# Patient Record
Sex: Male | Born: 1960
Health system: Southern US, Community
[De-identification: ages and names within clinical notes are randomized; demographics above are authoritative.]

## PROBLEM LIST (undated history)

## (undated) VITALS — BP 114/78 | HR 70 | Temp 97.8°F | Resp 18 | Ht 72.0 in | Wt 210.0 lb

## (undated) DIAGNOSIS — F419 Anxiety disorder, unspecified: Secondary | ICD-10-CM

## (undated) DIAGNOSIS — B20 Human immunodeficiency virus [HIV] disease: Secondary | ICD-10-CM

## (undated) DIAGNOSIS — Z21 Asymptomatic human immunodeficiency virus [HIV] infection status: Secondary | ICD-10-CM

## (undated) DIAGNOSIS — F32A Depression, unspecified: Secondary | ICD-10-CM

## (undated) DIAGNOSIS — F329 Major depressive disorder, single episode, unspecified: Secondary | ICD-10-CM

## (undated) HISTORY — PX: CATARACT EXTRACTION: SUR2

---

## 2011-04-16 ENCOUNTER — Inpatient Hospital Stay (INDEPENDENT_AMBULATORY_CARE_PROVIDER_SITE_OTHER)
Admission: RE | Admit: 2011-04-16 | Discharge: 2011-04-16 | Disposition: A | Discharge status: Home / Self Care | Payer: BC Managed Care – PPO | Source: Ambulatory Visit | Attending: Family Medicine | Admitting: Family Medicine

## 2011-04-16 DIAGNOSIS — J309 Allergic rhinitis, unspecified: Secondary | ICD-10-CM

## 2013-03-26 DIAGNOSIS — F411 Generalized anxiety disorder: Secondary | ICD-10-CM | POA: Insufficient documentation

## 2013-03-26 DIAGNOSIS — F410 Panic disorder [episodic paroxysmal anxiety] without agoraphobia: Secondary | ICD-10-CM | POA: Diagnosis present

## 2013-10-03 DIAGNOSIS — F331 Major depressive disorder, recurrent, moderate: Secondary | ICD-10-CM | POA: Insufficient documentation

## 2013-10-03 DIAGNOSIS — F339 Major depressive disorder, recurrent, unspecified: Secondary | ICD-10-CM | POA: Insufficient documentation

## 2014-07-29 ENCOUNTER — Encounter (HOSPITAL_COMMUNITY): Payer: Self-pay | Admitting: Emergency Medicine

## 2014-07-29 ENCOUNTER — Emergency Department (HOSPITAL_COMMUNITY)
Admission: EM | Admit: 2014-07-29 | Discharge: 2014-07-30 | Disposition: A | Discharge status: Other Acute Inpatient Hospital | Payer: BC Managed Care – PPO | Attending: Emergency Medicine | Admitting: Emergency Medicine

## 2014-07-29 DIAGNOSIS — Z9119 Patient's noncompliance with other medical treatment and regimen: Secondary | ICD-10-CM | POA: Diagnosis not present

## 2014-07-29 DIAGNOSIS — Z91199 Patient's noncompliance with other medical treatment and regimen due to unspecified reason: Secondary | ICD-10-CM

## 2014-07-29 DIAGNOSIS — F332 Major depressive disorder, recurrent severe without psychotic features: Secondary | ICD-10-CM | POA: Diagnosis not present

## 2014-07-29 DIAGNOSIS — F22 Delusional disorders: Secondary | ICD-10-CM | POA: Insufficient documentation

## 2014-07-29 DIAGNOSIS — R45851 Suicidal ideations: Secondary | ICD-10-CM | POA: Diagnosis present

## 2014-07-29 DIAGNOSIS — R443 Hallucinations, unspecified: Secondary | ICD-10-CM

## 2014-07-29 DIAGNOSIS — F322 Major depressive disorder, single episode, severe without psychotic features: Secondary | ICD-10-CM | POA: Diagnosis present

## 2014-07-29 HISTORY — DX: Major depressive disorder, single episode, unspecified: F32.9

## 2014-07-29 HISTORY — DX: Depression, unspecified: F32.A

## 2014-07-29 LAB — COMPREHENSIVE METABOLIC PANEL
ALT: 15 U/L (ref 0–53)
AST: 15 U/L (ref 0–37)
Albumin: 4.1 g/dL (ref 3.5–5.2)
Alkaline Phosphatase: 151 U/L — ABNORMAL HIGH (ref 39–117)
Anion gap: 16 — ABNORMAL HIGH (ref 5–15)
BUN: 27 mg/dL — ABNORMAL HIGH (ref 6–23)
CO2: 24 mEq/L (ref 19–32)
Calcium: 9.6 mg/dL (ref 8.4–10.5)
Chloride: 96 mEq/L (ref 96–112)
Creatinine, Ser: 1.13 mg/dL (ref 0.50–1.35)
GFR calc Af Amer: 84 mL/min — ABNORMAL LOW (ref >90)
GFR calc non Af Amer: 73 mL/min — ABNORMAL LOW (ref >90)
Glucose, Bld: 97 mg/dL (ref 70–99)
Potassium: 2.6 mEq/L — CL (ref 3.7–5.3)
Sodium: 136 mEq/L — ABNORMAL LOW (ref 137–147)
Total Bilirubin: 0.9 mg/dL (ref 0.3–1.2)
Total Protein: 7.9 g/dL (ref 6.0–8.3)

## 2014-07-29 LAB — URINALYSIS, ROUTINE W REFLEX MICROSCOPIC
Glucose, UA: NEGATIVE mg/dL
Ketones, ur: 15 mg/dL — AB
Nitrite: POSITIVE — AB
Protein, ur: 30 mg/dL — AB
Specific Gravity, Urine: 1.036 — ABNORMAL HIGH (ref 1.005–1.030)
Urobilinogen, UA: 1 mg/dL (ref 0.0–1.0)
pH: 5.5 (ref 5.0–8.0)

## 2014-07-29 LAB — CBC
HCT: 46.1 % (ref 39.0–52.0)
Hemoglobin: 16.6 g/dL (ref 13.0–17.0)
MCH: 32 pg (ref 26.0–34.0)
MCHC: 36 g/dL (ref 30.0–36.0)
MCV: 88.8 fL (ref 78.0–100.0)
Platelets: 345 10*3/uL (ref 150–400)
RBC: 5.19 MIL/uL (ref 4.22–5.81)
RDW: 13.5 % (ref 11.5–15.5)
WBC: 8.3 10*3/uL (ref 4.0–10.5)

## 2014-07-29 LAB — URINE MICROSCOPIC-ADD ON

## 2014-07-29 LAB — RAPID URINE DRUG SCREEN, HOSP PERFORMED
Amphetamines: NOT DETECTED
Barbiturates: NOT DETECTED
Benzodiazepines: POSITIVE — AB
Cocaine: NOT DETECTED
Opiates: NOT DETECTED
Tetrahydrocannabinol: NOT DETECTED

## 2014-07-29 LAB — SALICYLATE LEVEL: Salicylate Lvl: <2 mg/dL — ABNORMAL LOW (ref 2.8–20.0)

## 2014-07-29 LAB — ACETAMINOPHEN LEVEL: Acetaminophen (Tylenol), Serum: <15 ug/mL (ref 10–30)

## 2014-07-29 LAB — ETHANOL: Alcohol, Ethyl (B): <11 mg/dL (ref 0–11)

## 2014-07-29 MED ORDER — CEPHALEXIN 500 MG PO CAPS
500.0000 mg | ORAL_CAPSULE | Freq: Four times a day (QID) | ORAL | Status: DC
  Administered 2014-07-29 – 2014-07-30 (×6): 500 mg via ORAL
  Filled 2014-07-29 (×6): qty 1

## 2014-07-29 MED ORDER — IBUPROFEN 200 MG PO TABS: 600.0000 mg | ORAL_TABLET | Freq: Three times a day (TID) | ORAL | Status: DC | PRN

## 2014-07-29 MED ORDER — NEVIRAPINE 200 MG PO TABS
200.0000 mg | ORAL_TABLET | Freq: Every day | ORAL | Status: DC
  Administered 2014-07-29 – 2014-07-30 (×2): 200 mg via ORAL
  Filled 2014-07-29 (×2): qty 1

## 2014-07-29 MED ORDER — ESCITALOPRAM OXALATE 10 MG PO TABS
20.0000 mg | ORAL_TABLET | Freq: Every day | ORAL | Status: DC
  Administered 2014-07-29 – 2014-07-30 (×2): 20 mg via ORAL
  Filled 2014-07-29 (×2): qty 2

## 2014-07-29 MED ORDER — CLONAZEPAM 0.5 MG PO TABS
1.0000 mg | ORAL_TABLET | Freq: Every day | ORAL | Status: DC
  Administered 2014-07-29: 1 mg via ORAL
  Filled 2014-07-29: qty 2

## 2014-07-29 MED ORDER — QUETIAPINE FUMARATE 100 MG PO TABS
100.0000 mg | ORAL_TABLET | Freq: Every day | ORAL | Status: DC
  Administered 2014-07-29: 100 mg via ORAL
  Filled 2014-07-29: qty 1

## 2014-07-29 MED ORDER — NICOTINE 21 MG/24HR TD PT24
21.0000 mg | MEDICATED_PATCH | Freq: Every day | TRANSDERMAL | Status: DC
  Filled 2014-07-29: qty 1

## 2014-07-29 MED ORDER — EMTRICITABINE-TENOFOVIR DF 200-300 MG PO TABS
1.0000 | ORAL_TABLET | Freq: Every day | ORAL | Status: DC
  Administered 2014-07-29 – 2014-07-30 (×2): 1 via ORAL
  Filled 2014-07-29 (×2): qty 1

## 2014-07-29 MED ORDER — ONDANSETRON HCL 4 MG PO TABS: 4.0000 mg | ORAL_TABLET | Freq: Three times a day (TID) | ORAL | Status: DC | PRN

## 2014-07-29 MED ORDER — LORAZEPAM 1 MG PO TABS: 1.0000 mg | ORAL_TABLET | Freq: Three times a day (TID) | ORAL | Status: DC | PRN

## 2014-07-29 MED ORDER — DIAZEPAM 5 MG PO TABS: 10.0000 mg | ORAL_TABLET | Freq: Every evening | ORAL | Status: DC | PRN

## 2014-07-29 MED ORDER — NEVIRAPINE ER 400 MG PO TB24: 1.0000 | ORAL_TABLET | Freq: Every day | ORAL | Status: DC

## 2014-07-29 MED ORDER — POTASSIUM CHLORIDE CRYS ER 20 MEQ PO TBCR
40.0000 meq | EXTENDED_RELEASE_TABLET | ORAL | Status: AC
  Administered 2014-07-29 (×2): 40 meq via ORAL
  Filled 2014-07-29 (×2): qty 2

## 2014-07-29 MED ORDER — POTASSIUM CHLORIDE CRYS ER 20 MEQ PO TBCR
40.0000 meq | EXTENDED_RELEASE_TABLET | Freq: Once | ORAL | Status: AC
  Administered 2014-07-29: 40 meq via ORAL
  Filled 2014-07-29: qty 2

## 2014-07-29 MED ORDER — ALUM & MAG HYDROXIDE-SIMETH 200-200-20 MG/5ML PO SUSP: 30.0000 mL | ORAL | Status: DC | PRN

## 2014-07-29 MED ORDER — ACETAMINOPHEN 325 MG PO TABS: 650.0000 mg | ORAL_TABLET | ORAL | Status: DC | PRN

## 2014-07-29 MED ORDER — ZOLPIDEM TARTRATE 5 MG PO TABS: 5.0000 mg | ORAL_TABLET | Freq: Every evening | ORAL | Status: DC | PRN

## 2014-07-29 NOTE — BH Assessment (Signed)
Assessment Note  Tyler Horn is an 53 y.o. male who [facility] for worsening depression, suicidal ideation, and delusional thinking. The assessment was conducted with the assistance of a sign language interpreter. 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  He is in a Acupuncturistdoctoral program online.  He reports that for the last several years his medications were keeping him stable until about a month ago.  Tyler Horn reports that he's been somewhat scattered in thought and has been forgetting appointments and to take his medications. He states his depression has worsened and he endorses feelings of worthlessness, anhedonia, fatigue, guilt, hopelessness, and isolating behavior. About a week before Thanksgiving, he went to [facility] evaluation of these symptoms and was discharged. He reports he ran out of his medication some time after that and was unable to get them refilled because his head was "out of sorts" and he didn't feel safe to drive.  He tried to get someone to help him pick them up, but that didn't work and he continued to decompensate.  He reports missing appointments at work and beginning to have some delusional thoughts and somatic hallucinations including someone tapping on his leg, thinking a bug was flying by , hearing some chatter in his head, and feeling a bit afraid to leave his house.  He stopped eating on Thanksgiving and his suicidal ideation increased.  He was thinking of plans to harm himself and considered starving himself so he stopped eating entirely.  He reports he spoke to his brother who encouraged him to seek help in the ED.  Tyler Horn is alert and oriented x 4, he is calm and cooperative and goal oriented.  He denies HI or SA.  He has good insight.  He reports an increase in vegetative symptoms.  He does not feel safe at home at this time and would like to get his medications stabilized.  The patient was seen with Dr Jannifer FranklinAkintayo and Nanine MeansJamison Lord  who agree to seek inpatient treatment.    Axis I: Generalized Anxiety Disorder, Major Depression, Recurrent severe and with psychotic features Axis II: Deferred Axis III:  Past Medical History  Diagnosis Date  . Depression    Axis IV: problems with access to health care services Axis V: 41-50 serious symptoms  Past Medical History:  Past Medical History  Diagnosis Date  . Depression     No past surgical history on file.  Family History: No family history on file.  Social History:  has no tobacco, alcohol, and drug history on file.  Additional Social History:     CIWA: CIWA-Ar BP: 102/62 mmHg Pulse Rate: 87 COWS:    Allergies: No Known Allergies  Home Medications:  (Not in a hospital admission)  OB/GYN Status:  No LMP for male patient.  General Assessment Data Location of Assessment: [facility] Is this a Tele or Face-to-Face Assessment?: Face-to-Face Is this an Initial Assessment or a Re-assessment for this encounter?: Initial Assessment Living Arrangements: Alone Can pt return to current living arrangement?: Yes Admission Status: Voluntary Is patient capable of signing voluntary admission?: Yes Transfer from: Home Referral Source: Self/Family/Friend     [facility] Living Arrangements: Alone Name of Psychiatrist: [place]  Education Status Is patient currently in school?: Yes Current Grade: PhD program Highest grade of school patient has completed: Master's Degree  Risk to self with the past 6 months Suicidal Ideation: Yes-Currently Present  Suicidal Intent: No Is patient at risk for suicide?: Yes Suicidal Plan?: No-Not Currently/Within Last 6 Months (starve self to death, thinking of plan) Access to Means: Yes Specify Access to Suicidal Means: not eating What has been your use of drugs/alcohol within the last 12 months?: denies Intentional Self Injurious Behavior: None Family Suicide History: No Recent stressful life event(s):   (off meds, meds not as effective) Persecutory voices/beliefs?: No Depression: Yes Depression Symptoms: Despondent, Insomnia, Tearfulness, Isolating, Fatigue, Guilt, Loss of interest in usual pleasures, Feeling worthless/self pity Substance abuse history and/or treatment for substance abuse?: No Suicide prevention information given to non-admitted patients: Not applicable  Risk to Others within the past 6 months Homicidal Ideation: No Thoughts of Harm to Others: No Current Homicidal Intent: No Current Homicidal Plan: No Access to Homicidal Means: No History of harm to others?: No Assessment of Violence: None Noted Does patient have access to weapons?: No Criminal Charges Pending?: No Does patient have a court date: No  Psychosis Hallucinations: Tactile, Visual Delusions: Somatic  Mental Status Report Appear/Hygiene: Unremarkable Eye Contact: Good Motor Activity: Freedom of movement Speech: Other (Comment) Level of Consciousness: Alert Mood: Depressed Affect: Depressed Anxiety Level: Moderate Thought Processes: Coherent, Relevant Judgement: Unimpaired Orientation: Person, Place, Time, Situation Obsessive Compulsive Thoughts/Behaviors: Moderate  Cognitive Functioning Concentration: Decreased Memory: Recent Impaired, Remote Impaired IQ: Average Insight: Fair Impulse Control: Fair Appetite: Poor Weight Loss:  (unk) Weight Gain: 0 Sleep: Decreased Total Hours of Sleep:  (difficulty sleeping) Vegetative Symptoms: Staying in bed  ADLScreening [facility] Assessment Services) Patient's cognitive ability adequate to safely complete daily activities?: Yes Patient able to express need for assistance with ADLs?: Yes Independently performs ADLs?: Yes (appropriate for developmental age)  Prior Inpatient Therapy Prior Inpatient Therapy: Yes Prior Therapy Dates: 2009 Prior [facility]ilty[facility] Reason for Treatment: Depression  Prior  Outpatient Therapy Prior Outpatient Therapy: Yes Prior Therapy Dates: Ongoing Prior Therapy Facilty/Provider(s): Dr Sandria ManlyLove Reason for Treatment: Depression/Anxiety  ADL Screening (condition at time of admission) Patient's cognitive ability adequate to safely complete daily activities?: Yes Patient able to express need for assistance with ADLs?: Yes Independently performs ADLs?: Yes (appropriate for developmental age)             Advance Directives (For Healthcare) Does patient have an advance directive?: No Would patient like information on creating an advanced directive?: No - patient declined information    Additional Information 1:1 In Past 12 Months?: No CIRT Risk: No Elopement Risk: No Does patient have medical clearance?: Yes     Disposition:  Disposition Initial Assessment Completed for this Encounter: Yes Disposition of Patient: Inpatient treatment program Type of inpatient treatment program: Adult  On Site Evaluation by:   Reviewed with Physician:    Steward RosDavee Lomax, Kimisha Eunice Marie 07/29/2014 9:57 AM

## 2014-07-29 NOTE — ED Notes (Signed)
Urinal at bedside- patient unable to void at this time

## 2014-07-29 NOTE — ED Notes (Addendum)
Per ems pt from home, family called 911 because they have not heard from him for 2 todays. Per ems pt was hallucinating, co tremors. Pt reported not eating for 5 days. Pt received Haldol 5 mg. Hx depression. Pt is deaf. Per ems CBG 101

## 2014-07-29 NOTE — ED Notes (Signed)
Pt reports through interpretor that he sees a masked man like scream in the movie, feels bugs crawling on him and hears mumbling voices. Pt says that he had suicidal ideations with no specific plan before coming to the hospital and getting medication. Pt denies hi. Pt says that hallucinations are low now compared to before he came to the hospital. Safety maintained in the TCU.

## 2014-07-29 NOTE — ED Notes (Signed)
[facility] for [facility] [phone] option 2

## 2014-07-29 NOTE — ED Notes (Signed)
TTS at bedside. 

## 2014-07-29 NOTE — ED Notes (Signed)
Sign language interpreter at bedside. Dr. Gwendolyn GrantWalden made aware.

## 2014-07-29 NOTE — Progress Notes (Signed)
CSW met with pt and pt mother at bedside with interpreter. Pt signed consent to release information to pt mother. CSW, pt, TTS, and mother discussed pt dispositoin of pursuing inpatient treatment while also trying to stabilize patient while waiting in the ED. Patient is aware that we will look at local hosptials and [facility] has a hearing impaired unit. Patient agreeable with plan. CSW assisted with getting fmla papers from his work and provdided those papers to patient as he requested. Patient and CSW also discuss HCPOA and CSW to assist with completion tomorrow. CSW and TTS provided supportive counseling. 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   Noreene Larsson [phone]  ED CSW 07/29/2014 1547pm

## 2014-07-29 NOTE — ED Provider Notes (Signed)
CSN: [id number]     Arrival date & time 07/29/14  [id number] History   First MD Initiated Contact with Patient 07/29/14 0754     Chief Complaint  Patient presents with  . Hallucinations  . Suicidal     (Consider location/radiation/quality/duration/timing/severity/associated sxs/prior Treatment) Patient is a 53 y.o. male presenting with mental health disorder. The history is provided by the patient.  Mental Health Problem Presenting symptoms: suicidal thoughts and suicide attempt (tried to starve himself to death)   Degree of incapacity (severity):  Moderate Onset quality:  Gradual Duration:  1 week Timing:  Constant Progression:  Worsening Chronicity:  New Context: noncompliance (couldn't get his meds refilled - was too "out of it" to drive)   Treatment compliance:  Most of the time Time since last psychoactive medication taken:  1 week Relieved by:  Nothing Worsened by:  Nothing tried Associated symptoms: no abdominal pain     Past Medical History  Diagnosis Date  . Depression    No past surgical history on file. No family history on file. History  Substance Use Topics  . Smoking status: Not on file  . Smokeless tobacco: Not on file  . Alcohol Use: Not on file    Review of Systems  Constitutional: Negative for fever.  Respiratory: Negative for shortness of breath.   Gastrointestinal: Negative for vomiting and abdominal pain.  Psychiatric/Behavioral: Positive for suicidal ideas.  All other systems reviewed and are negative.     Allergies  Review of patient's allergies indicates no known allergies.  Home Medications   Prior to Admission medications   Not on File   BP 102/62 mmHg  Pulse 87  Temp(Src) 97.8 F (36.6 C) (Oral)  Resp 20  SpO2 97% Physical Exam  Constitutional: He is oriented to person, place, and time. He appears well-developed and well-nourished. No distress.  HENT:  Head: Normocephalic and atraumatic.  Mouth/Throat: Oropharynx is clear and  moist. No oropharyngeal exudate.  Eyes: EOM are normal. Pupils are equal, round, and reactive to light.  Neck: Normal range of motion. Neck supple.  Cardiovascular: Normal rate and regular rhythm.  Exam reveals no friction rub.   No murmur heard. Pulmonary/Chest: Effort normal and breath sounds normal. No respiratory distress. He has no wheezes. He has no rales.  Abdominal: Soft. He exhibits no distension. There is no tenderness. There is no rebound.  Musculoskeletal: Normal range of motion. He exhibits no edema.  Neurological: He is alert and oriented to person, place, and time.  Skin: No rash noted. He is not diaphoretic.  Psychiatric: He expresses suicidal ideation. He expresses no homicidal ideation. He expresses suicidal plans (starve himself to death). He expresses no homicidal plans.  Nursing note and vitals reviewed.   ED Course  Procedures (including critical care time) Labs Review Labs Reviewed  CBC  COMPREHENSIVE METABOLIC PANEL  SALICYLATE LEVEL  ACETAMINOPHEN LEVEL  ETHANOL  URINALYSIS, ROUTINE W REFLEX MICROSCOPIC  URINE RAPID DRUG SCREEN (HOSP PERFORMED)    Imaging Review No results found.   EKG Interpretation None      MDM   Final diagnoses:  Hallucinations  Noncompliance  Suicidal ideation    53 year old male with history of depression, prior psychiatric hospitalization 7 or 8 years ago, deafness presents with hallucinations and suicidal ideation. Patient has not had his meds in over a week, he tried to drive to get them refilled but was feeling out of it. He has not eaten in the past week and has felt suicidal wanting  to start himself to death. He's had other hallucinations of people tapping him on the shoulder, hearing indistinct voices, bugs crawling on him. These are similar to prior hallucinations. Will check labs, consult TTS. Labs show hypokalemia. Plan for scheduled potassium. [facility] [place]  because they have a deaf unit. Labs show UTI, culture sent, will start on Keflex.  Elwin MochaBlair Tammela Bales, MD 07/29/14 [phone]

## 2014-07-30 ENCOUNTER — Inpatient Hospital Stay (HOSPITAL_COMMUNITY)
Admission: AD | Admit: 2014-07-30 | Discharge: 2014-08-06 | DRG: 885 | Disposition: A | Discharge status: Home / Self Care | Payer: BC Managed Care – PPO | Source: Intra-hospital | Attending: Psychiatry | Admitting: Psychiatry

## 2014-07-30 ENCOUNTER — Encounter (HOSPITAL_COMMUNITY): Payer: Self-pay | Admitting: Registered Nurse

## 2014-07-30 ENCOUNTER — Encounter (HOSPITAL_COMMUNITY): Payer: Self-pay | Admitting: *Deleted

## 2014-07-30 DIAGNOSIS — R441 Visual hallucinations: Secondary | ICD-10-CM | POA: Insufficient documentation

## 2014-07-30 DIAGNOSIS — R45851 Suicidal ideations: Secondary | ICD-10-CM

## 2014-07-30 DIAGNOSIS — F332 Major depressive disorder, recurrent severe without psychotic features: Secondary | ICD-10-CM | POA: Diagnosis present

## 2014-07-30 DIAGNOSIS — H9193 Unspecified hearing loss, bilateral: Secondary | ICD-10-CM | POA: Diagnosis present

## 2014-07-30 DIAGNOSIS — F410 Panic disorder [episodic paroxysmal anxiety] without agoraphobia: Secondary | ICD-10-CM | POA: Diagnosis present

## 2014-07-30 DIAGNOSIS — N390 Urinary tract infection, site not specified: Secondary | ICD-10-CM | POA: Diagnosis present

## 2014-07-30 DIAGNOSIS — F322 Major depressive disorder, single episode, severe without psychotic features: Secondary | ICD-10-CM | POA: Diagnosis present

## 2014-07-30 DIAGNOSIS — B20 Human immunodeficiency virus [HIV] disease: Secondary | ICD-10-CM | POA: Diagnosis present

## 2014-07-30 DIAGNOSIS — G4700 Insomnia, unspecified: Secondary | ICD-10-CM | POA: Diagnosis present

## 2014-07-30 DIAGNOSIS — F29 Unspecified psychosis not due to a substance or known physiological condition: Secondary | ICD-10-CM

## 2014-07-30 DIAGNOSIS — Z21 Asymptomatic human immunodeficiency virus [HIV] infection status: Secondary | ICD-10-CM | POA: Diagnosis present

## 2014-07-30 DIAGNOSIS — F329 Major depressive disorder, single episode, unspecified: Secondary | ICD-10-CM | POA: Diagnosis present

## 2014-07-30 DIAGNOSIS — F1390 Sedative, hypnotic, or anxiolytic use, unspecified, uncomplicated: Secondary | ICD-10-CM

## 2014-07-30 DIAGNOSIS — F13232 Sedative, hypnotic or anxiolytic dependence with withdrawal with perceptual disturbance: Secondary | ICD-10-CM | POA: Diagnosis present

## 2014-07-30 DIAGNOSIS — F411 Generalized anxiety disorder: Secondary | ICD-10-CM | POA: Diagnosis present

## 2014-07-30 DIAGNOSIS — F1320 Sedative, hypnotic or anxiolytic dependence, uncomplicated: Secondary | ICD-10-CM

## 2014-07-30 DIAGNOSIS — F22 Delusional disorders: Secondary | ICD-10-CM | POA: Diagnosis present

## 2014-07-30 DIAGNOSIS — R443 Hallucinations, unspecified: Secondary | ICD-10-CM | POA: Insufficient documentation

## 2014-07-30 DIAGNOSIS — F333 Major depressive disorder, recurrent, severe with psychotic symptoms: Secondary | ICD-10-CM | POA: Diagnosis present

## 2014-07-30 HISTORY — DX: Asymptomatic human immunodeficiency virus (hiv) infection status: Z21

## 2014-07-30 HISTORY — DX: Anxiety disorder, unspecified: F41.9

## 2014-07-30 HISTORY — DX: Human immunodeficiency virus (HIV) disease: B20

## 2014-07-30 LAB — URINE CULTURE
Colony Count: NO GROWTH
Culture: NO GROWTH
Special Requests: NORMAL

## 2014-07-30 LAB — BASIC METABOLIC PANEL
Anion gap: 12 (ref 5–15)
BUN: 26 mg/dL — ABNORMAL HIGH (ref 6–23)
CO2: 29 mEq/L (ref 19–32)
Calcium: 9.6 mg/dL (ref 8.4–10.5)
Chloride: 99 mEq/L (ref 96–112)
Creatinine, Ser: 1.11 mg/dL (ref 0.50–1.35)
GFR calc Af Amer: 86 mL/min — ABNORMAL LOW (ref >90)
GFR calc non Af Amer: 74 mL/min — ABNORMAL LOW (ref >90)
Glucose, Bld: 103 mg/dL — ABNORMAL HIGH (ref 70–99)
Potassium: 3.9 mEq/L (ref 3.7–5.3)
Sodium: 140 mEq/L (ref 137–147)

## 2014-07-30 MED ORDER — CEPHALEXIN 500 MG PO CAPS
500.0000 mg | ORAL_CAPSULE | Freq: Four times a day (QID) | ORAL | Status: AC
  Administered 2014-07-30 – 2014-08-05 (×23): 500 mg via ORAL
  Filled 2014-07-30 (×8): qty 1
  Filled 2014-07-30: qty 2
  Filled 2014-07-30 (×10): qty 1
  Filled 2014-07-30: qty 2
  Filled 2014-07-30 (×5): qty 1

## 2014-07-30 MED ORDER — EMTRICITABINE-TENOFOVIR DF 200-300 MG PO TABS
1.0000 | ORAL_TABLET | Freq: Every day | ORAL | Status: DC
  Administered 2014-07-31 – 2014-08-06 (×7): 1 via ORAL
  Filled 2014-07-30 (×8): qty 1
  Filled 2014-07-30: qty 4

## 2014-07-30 MED ORDER — NEVIRAPINE ER 400 MG PO TB24: 1.0000 | ORAL_TABLET | Freq: Every day | ORAL | Status: DC

## 2014-07-30 MED ORDER — ZOLPIDEM TARTRATE 5 MG PO TABS
5.0000 mg | ORAL_TABLET | Freq: Every evening | ORAL | Status: DC | PRN
  Administered 2014-07-30: 5 mg via ORAL
  Filled 2014-07-30: qty 1

## 2014-07-30 MED ORDER — ESCITALOPRAM OXALATE 20 MG PO TABS
20.0000 mg | ORAL_TABLET | Freq: Every day | ORAL | Status: DC
  Administered 2014-07-31: 20 mg via ORAL
  Filled 2014-07-30: qty 2
  Filled 2014-07-30 (×2): qty 1

## 2014-07-30 MED ORDER — MAGNESIUM HYDROXIDE 400 MG/5ML PO SUSP: 30.0000 mL | Freq: Every day | ORAL | Status: DC | PRN

## 2014-07-30 MED ORDER — CLONAZEPAM 1 MG PO TABS
1.0000 mg | ORAL_TABLET | Freq: Every day | ORAL | Status: DC
  Administered 2014-07-30: 1 mg via ORAL
  Filled 2014-07-30: qty 1

## 2014-07-30 MED ORDER — QUETIAPINE FUMARATE 100 MG PO TABS
100.0000 mg | ORAL_TABLET | Freq: Every day | ORAL | Status: DC
  Administered 2014-07-30: 100 mg via ORAL
  Filled 2014-07-30 (×3): qty 1

## 2014-07-30 MED ORDER — NEVIRAPINE 200 MG PO TABS
200.0000 mg | ORAL_TABLET | Freq: Every day | ORAL | Status: DC
  Administered 2014-07-31 – 2014-08-06 (×7): 200 mg via ORAL
  Filled 2014-07-30 (×3): qty 1
  Filled 2014-07-30: qty 2
  Filled 2014-07-30 (×6): qty 1
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
## 2014-07-30 NOTE — Progress Notes (Signed)
  CARE MANAGEMENT ED NOTE 07/30/2014  Patient:  Annice PihHUNTER,Marlene   Account Number:  [id number]  Date Initiated:  07/30/2014  Documentation initiated by:  Edd ArbourGIBBS,Andrews Tener  Subjective/Objective Assessment:   53 yr [place] bcbs [place]     Subjective/Objective Assessment Detail:   Pcp confirms (with Interpreter assistance) that he has a psychiatrist not a pcp  Pt agreed to a list of in network pcps via [employer] web site     Action/Plan:   Pt given a list of in network pcps & psychiatrists for [facility]   Action/Plan Detail:   Anticipated DC Date:       Status Recommendation to Physician:   Result of Recommendation:    Other ED Services  Consult Working Psychologist, educationallan    DC Planning Services  Other  Outpatient Services - Pt will follow up  PCP issues    Choice offered to / List presented to:            Status of service:  Completed, signed off  ED Comments:   ED Comments Detail:

## 2014-07-30 NOTE — BHH Group Notes (Signed)
Adult Psychoeducational Group Note  Date:  07/30/2014 Time:  9:37 PM  Group Topic/Focus:  Wrap-Up Group:   The focus of this group is to help patients review their daily goal of treatment and discuss progress on daily workbooks.  Participation Level:  Active  Participation Quality:  Appropriate  Affect:  Appropriate  Cognitive:  Appropriate  Insight: Appropriate  Engagement in Group:  Engaged  Modes of Intervention:  Discussion  Additional Comments:  Tyler Horn is Horn new admit.  He stated his goal is to participate in groups, become healthy again, be compliant with schedule, meals, etc.  His motivation is staying positive and learning different things from the groups because "it's Horn journey to reach recovery and treatment".  Tyler Horn, Tyler Horn 07/30/2014, 9:37 PM

## 2014-07-30 NOTE — BH Assessment (Signed)
BHH Assessment Progress Note  Per Berneice Heinrichina Tate, RN, [facility] [place] [phone] is available for pt, who was previously accepted by Thedore MinsMojeed Akintayo, MD.  Pt signed Voluntary Admission and Consent for Treatment, as well as Consent to Release Information, both of which were faxed to [facility].  Pt's nurse, Joslyn Devonaroline Beaudry, has been informed; she agrees to send original paperwork with the pt and to call report to [phone].  Doylene Canninghomas Aysia Lowder, MA Triage Specialist 07/30/2014 @ 13:05

## 2014-07-30 NOTE — ED Notes (Signed)
Pt  Talking (signing)  in while asleep

## 2014-07-30 NOTE — Consult Note (Addendum)
[facility] Face-to-Face Psychiatry Consult   Reason for Consult:  Suicidal ideation Referring Physician:  [name]  Tyler Horn is an 53 y.o. male. Total Time spent with patient: 45 minutes  Assessment: AXIS I:  Major Depression, Recurrent severe and Psychotic Disorder NOS AXIS II:  Deferred AXIS III:   Past Medical History  Diagnosis Date  . Depression    AXIS IV:  other psychosocial or environmental problems AXIS V:  11-20 some danger of hurting self or others possible OR occasionally fails to maintain minimal personal hygiene OR gross impairment in communication  Plan:  Recommend psychiatric Inpatient admission when medically cleared.  Subjective:   Tyler Horn is a 53 y.o. male patient.  HPI:  Patient presents with complaints of suicidal ideation, PTSD, and worsening depression.  Patient states that he is feeling a little better today since he has gotten some rest.  Patient continue to endorse suicidal ideation but states that his mind is "little clearer; not as foggy and yesterday."  HPI Elements:   Location:  Worsening depression. Quality:  suicidal ideation. Severity:  suicidal ideation. Timing:  1 week.  Past Psychiatric History: Past Medical History  Diagnosis Date  . Depression     has no tobacco, alcohol, and drug history on file. History reviewed. No pertinent family history. Family History Substance Abuse: No Family Supports: Yes, List: (mother in [place], Brother in [place]) Living Arrangements: Alone Can pt return to current living arrangement?: Yes   Allergies:  No Known Allergies  ACT Assessment Complete:  Yes:    Educational Status    Risk to Self: Risk to self with the past 6 months Suicidal Ideation: Yes-Currently Present Suicidal Intent: No Is patient at risk for suicide?: Yes Suicidal Plan?: No-Not Currently/Within Last 6 Months (starve self to death, thinking of plan) Access to Means: Yes Specify Access to Suicidal Means: not eating What has been  your use of drugs/alcohol within the last 12 months?: denies Intentional Self Injurious Behavior: None Family Suicide History: No Recent stressful life event(s):  (off meds, meds not as effective) Persecutory voices/beliefs?: No Depression: Yes Depression Symptoms: Despondent, Insomnia, Tearfulness, Isolating, Fatigue, Guilt, Loss of interest in usual pleasures, Feeling worthless/self pity Substance abuse history and/or treatment for substance abuse?: No Suicide prevention information given to non-admitted patients: Not applicable  Risk to Others: Risk to Others within the past 6 months Homicidal Ideation: No Thoughts of Harm to Others: No Current Homicidal Intent: No Current Homicidal Plan: No Access to Homicidal Means: No History of harm to others?: No Assessment of Violence: None Noted Does patient have access to weapons?: No Criminal Charges Pending?: No Does patient have a court date: No  Abuse:    Prior Inpatient Therapy: Prior Inpatient Therapy Prior Inpatient Therapy: Yes Prior Therapy Dates: 2009 Prior Therapy Facilty/Provider(s): [facility] Reason for Treatment: Depression  Prior Outpatient Therapy: Prior Outpatient Therapy Prior Outpatient Therapy: Yes Prior Therapy Dates: Ongoing Prior Therapy Facilty/Provider(s): Dr Erling Cruz Reason for Treatment: Depression/Anxiety  Additional Information: Additional Information 1:1 In Past 12 Months?: No CIRT Risk: No Elopement Risk: No Does patient have medical clearance?: Yes                  Objective: Blood pressure 108/70, pulse 89, temperature 97.9 F (36.6 C), temperature source Oral, resp. rate 20, SpO2 94 %.There is no height or weight on file to calculate BMI. Results for orders placed or performed during the hospital encounter of 07/29/14 (from the past 72 hour(s))  CBC  Status: None   Collection Time: 07/29/14  8:52 AM  Result Value Ref Range   WBC 8.3 4.0 - 10.5 K/uL   RBC 5.19  4.22 - 5.81 MIL/uL   Hemoglobin 16.6 13.0 - 17.0 g/dL   HCT 46.1 39.0 - 52.0 %   MCV 88.8 78.0 - 100.0 fL   MCH 32.0 26.0 - 34.0 pg   MCHC 36.0 30.0 - 36.0 g/dL   RDW 13.5 11.5 - 15.5 %   Platelets 345 150 - 400 K/uL  Comprehensive metabolic panel     Status: Abnormal   Collection Time: 07/29/14  8:52 AM  Result Value Ref Range   Sodium 136 (L) 137 - 147 mEq/L   Potassium 2.6 (LL) 3.7 - 5.3 mEq/L    Comment: CRITICAL RESULT CALLED TO, READ BACK BY AND VERIFIED WITH: HAMBY,M @ 1012 ON [id number] BY POTEAT,S    Chloride 96 96 - 112 mEq/L   CO2 24 19 - 32 mEq/L   Glucose, Bld 97 70 - 99 mg/dL   BUN 27 (H) 6 - 23 mg/dL   Creatinine, Ser 1.13 0.50 - 1.35 mg/dL   Calcium 9.6 8.4 - 10.5 mg/dL   Total Protein 7.9 6.0 - 8.3 g/dL   Albumin 4.1 3.5 - 5.2 g/dL   AST 15 0 - 37 U/L   ALT 15 0 - 53 U/L   Alkaline Phosphatase 151 (H) 39 - 117 U/L   Total Bilirubin 0.9 0.3 - 1.2 mg/dL   GFR calc non Af Amer 73 (L) >90 mL/min   GFR calc Af Amer 84 (L) >90 mL/min    Comment: (NOTE) The eGFR has been calculated using the CKD EPI equation. This calculation has not been validated in all clinical situations. eGFR's persistently <90 mL/min signify possible Chronic Kidney Disease.    Anion gap 16 (H) 5 - 15  Salicylate level     Status: Abnormal   Collection Time: 07/29/14  8:52 AM  Result Value Ref Range   Salicylate Lvl <4.1 (L) 2.8 - 20.0 mg/dL  Acetaminophen level     Status: None   Collection Time: 07/29/14  8:52 AM  Result Value Ref Range   Acetaminophen (Tylenol), Serum <15.0 10 - 30 ug/mL    Comment:        THERAPEUTIC CONCENTRATIONS VARY SIGNIFICANTLY. A RANGE OF 10-30 ug/mL MAY BE AN EFFECTIVE CONCENTRATION FOR MANY PATIENTS. HOWEVER, SOME ARE BEST TREATED AT CONCENTRATIONS OUTSIDE THIS RANGE. ACETAMINOPHEN CONCENTRATIONS >150 ug/mL AT 4 HOURS AFTER INGESTION AND >50 ug/mL AT 12 HOURS AFTER INGESTION ARE OFTEN ASSOCIATED WITH TOXIC REACTIONS.   Ethanol     Status: None    Collection Time: 07/29/14  8:52 AM  Result Value Ref Range   Alcohol, Ethyl (B) <11 0 - 11 mg/dL    Comment:        LOWEST DETECTABLE LIMIT FOR SERUM ALCOHOL IS 11 mg/dL FOR MEDICAL PURPOSES ONLY   Urinalysis, Routine w reflex microscopic     Status: Abnormal   Collection Time: 07/29/14 12:30 PM  Result Value Ref Range   Color, Urine ORANGE (A) YELLOW    Comment: BIOCHEMICALS MAY BE AFFECTED BY COLOR   APPearance CLEAR CLEAR   Specific Gravity, Urine 1.036 (H) 1.005 - 1.030   pH 5.5 5.0 - 8.0   Glucose, UA NEGATIVE NEGATIVE mg/dL   Hgb urine dipstick TRACE (A) NEGATIVE   Bilirubin Urine LARGE (A) NEGATIVE   Ketones, ur 15 (A) NEGATIVE mg/dL   Protein, ur 30 (  A) NEGATIVE mg/dL   Urobilinogen, UA 1.0 0.0 - 1.0 mg/dL   Nitrite POSITIVE (A) NEGATIVE   Leukocytes, UA TRACE (A) NEGATIVE  Urine rapid drug screen (hosp performed)     Status: Abnormal   Collection Time: 07/29/14 12:30 PM  Result Value Ref Range   Opiates NONE DETECTED NONE DETECTED   Cocaine NONE DETECTED NONE DETECTED   Benzodiazepines POSITIVE (A) NONE DETECTED   Amphetamines NONE DETECTED NONE DETECTED   Tetrahydrocannabinol NONE DETECTED NONE DETECTED   Barbiturates NONE DETECTED NONE DETECTED    Comment:        DRUG SCREEN FOR MEDICAL PURPOSES ONLY.  IF CONFIRMATION IS NEEDED FOR ANY PURPOSE, NOTIFY LAB WITHIN 5 DAYS.        LOWEST DETECTABLE LIMITS FOR URINE DRUG SCREEN Drug Class       Cutoff (ng/mL) Amphetamine      1000 Barbiturate      200 Benzodiazepine   419 Tricyclics       379 Opiates          300 Cocaine          300 THC              50   Urine microscopic-add on     Status: Abnormal   Collection Time: 07/29/14 12:30 PM  Result Value Ref Range   WBC, UA 3-6 <3 WBC/hpf   RBC / HPF 0-2 <3 RBC/hpf   Bacteria, UA FEW (A) RARE   Urine-Other MUCOUS PRESENT   Basic metabolic panel     Status: Abnormal   Collection Time: 07/29/14 10:32 PM  Result Value Ref Range   Sodium 140 137 - 147 mEq/L    Potassium 3.9 3.7 - 5.3 mEq/L    Comment: DELTA CHECK NOTED REPEATED TO VERIFY NO VISIBLE HEMOLYSIS    Chloride 99 96 - 112 mEq/L   CO2 29 19 - 32 mEq/L   Glucose, Bld 103 (H) 70 - 99 mg/dL   BUN 26 (H) 6 - 23 mg/dL   Creatinine, Ser 1.11 0.50 - 1.35 mg/dL   Calcium 9.6 8.4 - 10.5 mg/dL   GFR calc non Af Amer 74 (L) >90 mL/min   GFR calc Af Amer 86 (L) >90 mL/min    Comment: (NOTE) The eGFR has been calculated using the CKD EPI equation. This calculation has not been validated in all clinical situations. eGFR's persistently <90 mL/min signify possible Chronic Kidney Disease.    Anion gap 12 5 - 15   Labs are reviewed see values above.  Medications reviewed and no changes made  Current Facility-Administered Medications  Medication Dose Route Frequency Provider Last Rate Last Dose  . acetaminophen (TYLENOL) tablet 650 mg  650 mg Oral Q4H PRN Evelina Bucy, MD      . alum & mag hydroxide-simeth (MAALOX/MYLANTA) 200-200-20 MG/5ML suspension 30 mL  30 mL Oral PRN Evelina Bucy, MD      . cephALEXin [facility]) capsule 500 mg  500 mg Oral 4 times per day Evelina Bucy, MD   500 mg at 07/30/14 1345  . clonazePAM (KLONOPIN) tablet 1 mg  1 mg Oral QHS Evelina Bucy, MD   1 mg at 07/29/14 2120  . diazepam (VALIUM) tablet 10 mg  10 mg Oral QHS PRN Evelina Bucy, MD      . emtricitabine-tenofovir (TRUVADA) 200-300 MG per tablet 1 tablet  1 tablet Oral Daily Evelina Bucy, MD   1 tablet at 07/30/14 1023  . escitalopram (LEXAPRO) tablet 20  mg  20 mg Oral Daily Evelina Bucy, MD   20 mg at 07/30/14 1022  . ibuprofen (ADVIL,MOTRIN) tablet 600 mg  600 mg Oral Q8H PRN Evelina Bucy, MD      . LORazepam (ATIVAN) tablet 1 mg  1 mg Oral Q8H PRN Evelina Bucy, MD      . nevirapine [facility]) tablet 200 mg  200 mg Oral Daily Evelina Bucy, MD   200 mg at 07/30/14 1023  . [START ON 08/12/2014] Nevirapine TB24 1 tablet  1 tablet Oral Daily Evelina Bucy, MD      . ondansetron [facility]) tablet 4 mg  4 mg Oral Q8H PRN  Evelina Bucy, MD      . QUEtiapine (SEROQUEL) tablet 100 mg  100 mg Oral QHS Evelina Bucy, MD   100 mg at 07/29/14 2120  . zolpidem (AMBIEN) tablet 5 mg  5 mg Oral QHS PRN Evelina Bucy, MD       Current Outpatient Prescriptions  Medication Sig Dispense Refill  . clonazePAM (KLONOPIN) 1 MG tablet Take 1 tablet by mouth at bedtime.    . diazepam (VALIUM) 10 MG tablet Take 10 mg by mouth at bedtime as needed for anxiety.    Marland Kitchen emtricitabine-tenofovir (TRUVADA) 200-300 MG per tablet Take 1 tablet by mouth daily.    Marland Kitchen escitalopram (LEXAPRO) 20 MG tablet Take 1 tablet by mouth daily.    . Nevirapine (VIRAMUNE XR) 400 MG TB24 Take 1 tablet by mouth daily.    . QUEtiapine (SEROQUEL) 100 MG tablet Take 1 tablet by mouth at bedtime.      Psychiatric Specialty Exam:     Blood pressure 108/70, pulse 89, temperature 97.9 F (36.6 C), temperature source Oral, resp. rate 20, SpO2 94 %.There is no height or weight on file to calculate BMI.  General Appearance: Casual  Eye Contact::  Good  Speech:  Sign language   Volume:  Sign language   Mood:  Depressed  Affect:  Congruent  Thought Process:  Circumstantial, Coherent and Goal Directed  Orientation:  Full (Time, Place, and Person)  Thought Content:  Delusions  Suicidal Thoughts:  Yes.  with intent/plan  Homicidal Thoughts:  No  Memory:  Immediate;   Good Recent;   Good Remote;   Good  Judgement:  Fair  Insight:  Fair  Psychomotor Activity:  Normal  Concentration:  Fair  Recall:  Good  Fund of Knowledge:Good  Language: Good  Akathisia:  No  Handed:  Right  AIMS (if indicated):     Assets:  Communication Skills Desire for Improvement Social Support  Sleep:      Musculoskeletal: Strength & Muscle Tone: within normal limits Gait & Station: Did not see patient ambulate Patient leans: N/A  Treatment Plan Summary: Daily contact with patient to assess and evaluate symptoms and progress in treatment Medication management Inpatient  treatment for stabilization recommended         Patient accepted to [facility] 508/02  Earleen Newport, FNP-BC 07/30/2014 4:50 PM  Patient seen, evaluated and I agree with notes by Nurse Practitioner. Corena Pilgrim, MD

## 2014-07-30 NOTE — ED Notes (Signed)
Patient has been quietly resting this morning.  Interpreter was here when MD was making rounds.  [facility]  He will be transferred for further inpatient treatment once interpreter returns.  Patient denies any SI; states he is "feeling better this morning."  He is pleasant and cooperative.

## 2014-07-30 NOTE — ED Notes (Signed)
Interpreter paged.  Tresa EndoKelly returned call and interpreter will arrive at 0930 before MD makes rounds.

## 2014-07-30 NOTE — Tx Team (Signed)
Initial Interdisciplinary Treatment Plan   PATIENT STRESSORS: Health problems Occupational concerns   PATIENT STRENGTHS: Ability for insight Average or above average intelligence Supportive family/friends   PROBLEM LIST: Problem List/Patient Goals Date to be addressed Date deferred Reason deferred Estimated date of resolution  Suicidal ideation 07/30/14   DC  depression                                                 DISCHARGE CRITERIA:  Improved stabilization in mood, thinking, and/or behavior Reduction of life-threatening or endangering symptoms to within safe limits Verbal commitment to aftercare and medication compliance  PRELIMINARY DISCHARGE PLAN: Outpatient therapy Return to previous living arrangement Return to previous work or school arrangements  PATIENT/FAMIILY INVOLVEMENT: This treatment plan has been presented to and reviewed with the patient, Tyler Horn, and/or family member, pt.  The patient and family have been given the opportunity to ask questions and make suggestions.  [name], [name] 07/30/2014, 7:47 PM

## 2014-07-31 ENCOUNTER — Encounter (HOSPITAL_COMMUNITY): Payer: Self-pay | Admitting: Psychiatry

## 2014-07-31 DIAGNOSIS — F333 Major depressive disorder, recurrent, severe with psychotic symptoms: Secondary | ICD-10-CM | POA: Diagnosis present

## 2014-07-31 DIAGNOSIS — F411 Generalized anxiety disorder: Secondary | ICD-10-CM

## 2014-07-31 DIAGNOSIS — B20 Human immunodeficiency virus [HIV] disease: Secondary | ICD-10-CM | POA: Diagnosis present

## 2014-07-31 DIAGNOSIS — F1320 Sedative, hypnotic or anxiolytic dependence, uncomplicated: Secondary | ICD-10-CM

## 2014-07-31 DIAGNOSIS — F13932 Sedative, hypnotic or anxiolytic use, unspecified with withdrawal with perceptual disturbances: Secondary | ICD-10-CM

## 2014-07-31 DIAGNOSIS — F322 Major depressive disorder, single episode, severe without psychotic features: Secondary | ICD-10-CM

## 2014-07-31 DIAGNOSIS — F1390 Sedative, hypnotic, or anxiolytic use, unspecified, uncomplicated: Secondary | ICD-10-CM

## 2014-07-31 MED ORDER — THIAMINE HCL 100 MG/ML IJ SOLN: 100.0000 mg | Freq: Once | INTRAMUSCULAR | Status: DC

## 2014-07-31 MED ORDER — LORAZEPAM 1 MG PO TABS: 1.0000 mg | ORAL_TABLET | Freq: Four times a day (QID) | ORAL | Status: DC

## 2014-07-31 MED ORDER — CHLORDIAZEPOXIDE HCL 25 MG PO CAPS
25.0000 mg | ORAL_CAPSULE | Freq: Four times a day (QID) | ORAL | Status: AC | PRN
  Administered 2014-08-02 – 2014-08-03 (×3): 25 mg via ORAL
  Filled 2014-07-31 (×2): qty 1

## 2014-07-31 MED ORDER — LORAZEPAM 1 MG PO TABS: 1.0000 mg | ORAL_TABLET | Freq: Three times a day (TID) | ORAL | Status: DC

## 2014-07-31 MED ORDER — HYDROXYZINE HCL 25 MG PO TABS
25.0000 mg | ORAL_TABLET | Freq: Four times a day (QID) | ORAL | Status: DC | PRN
Start: 2014-07-31 — End: 2014-07-31

## 2014-07-31 MED ORDER — HYDROXYZINE HCL 25 MG PO TABS
25.0000 mg | ORAL_TABLET | Freq: Four times a day (QID) | ORAL | Status: AC | PRN
  Administered 2014-08-02: 25 mg via ORAL
  Filled 2014-07-31: qty 1

## 2014-07-31 MED ORDER — LOPERAMIDE HCL 2 MG PO CAPS: 2.0000 mg | ORAL_CAPSULE | ORAL | Status: DC | PRN

## 2014-07-31 MED ORDER — CLONAZEPAM 0.5 MG PO TABS: 0.2500 mg | ORAL_TABLET | Freq: Three times a day (TID) | ORAL | Status: DC

## 2014-07-31 MED ORDER — FLUVOXAMINE MALEATE 50 MG PO TABS: 25.0000 mg | ORAL_TABLET | Freq: Two times a day (BID) | ORAL | Status: DC

## 2014-07-31 MED ORDER — FLUVOXAMINE MALEATE 50 MG PO TABS
25.0000 mg | ORAL_TABLET | Freq: Every day | ORAL | Status: DC
  Administered 2014-07-31 – 2014-08-02 (×3): 25 mg via ORAL
  Filled 2014-07-31 (×5): qty 1

## 2014-07-31 MED ORDER — CHLORDIAZEPOXIDE HCL 25 MG PO CAPS
25.0000 mg | ORAL_CAPSULE | Freq: Four times a day (QID) | ORAL | Status: AC
  Administered 2014-07-31 (×3): 25 mg via ORAL
  Filled 2014-07-31 (×3): qty 1

## 2014-07-31 MED ORDER — GABAPENTIN 100 MG PO CAPS
100.0000 mg | ORAL_CAPSULE | Freq: Three times a day (TID) | ORAL | Status: DC
  Administered 2014-07-31 – 2014-08-02 (×7): 100 mg via ORAL
  Filled 2014-07-31 (×15): qty 1

## 2014-07-31 MED ORDER — ESCITALOPRAM OXALATE 10 MG PO TABS
10.0000 mg | ORAL_TABLET | Freq: Every day | ORAL | Status: DC
  Administered 2014-08-01: 10 mg via ORAL
  Filled 2014-07-31 (×2): qty 1

## 2014-07-31 MED ORDER — THIAMINE HCL 100 MG/ML IJ SOLN
100.0000 mg | Freq: Once | INTRAMUSCULAR | Status: DC
  Filled 2014-07-31: qty 2

## 2014-07-31 MED ORDER — CHLORDIAZEPOXIDE HCL 25 MG PO CAPS
25.0000 mg | ORAL_CAPSULE | Freq: Three times a day (TID) | ORAL | Status: AC
  Administered 2014-08-01 (×3): 25 mg via ORAL
  Filled 2014-07-31 (×3): qty 1

## 2014-07-31 MED ORDER — LOPERAMIDE HCL 2 MG PO CAPS: 2.0000 mg | ORAL_CAPSULE | ORAL | Status: AC | PRN

## 2014-07-31 MED ORDER — LORAZEPAM 1 MG PO TABS: 1.0000 mg | ORAL_TABLET | Freq: Every day | ORAL | Status: DC

## 2014-07-31 MED ORDER — LORAZEPAM 1 MG PO TABS: 1.0000 mg | ORAL_TABLET | Freq: Two times a day (BID) | ORAL | Status: DC

## 2014-07-31 MED ORDER — CHLORDIAZEPOXIDE HCL 25 MG PO CAPS
25.0000 mg | ORAL_CAPSULE | ORAL | Status: AC
  Administered 2014-08-02: 25 mg via ORAL
  Filled 2014-07-31 (×2): qty 1

## 2014-07-31 MED ORDER — VITAMIN B-1 100 MG PO TABS: 100.0000 mg | ORAL_TABLET | Freq: Every day | ORAL | Status: DC

## 2014-07-31 MED ORDER — ONDANSETRON 4 MG PO TBDP: 4.0000 mg | ORAL_TABLET | Freq: Four times a day (QID) | ORAL | Status: AC | PRN

## 2014-07-31 MED ORDER — LORAZEPAM 1 MG PO TABS: 1.0000 mg | ORAL_TABLET | Freq: Four times a day (QID) | ORAL | Status: DC | PRN

## 2014-07-31 MED ORDER — CHLORDIAZEPOXIDE HCL 25 MG PO CAPS
25.0000 mg | ORAL_CAPSULE | Freq: Every day | ORAL | Status: AC
  Administered 2014-08-02: 25 mg via ORAL
  Filled 2014-07-31: qty 1

## 2014-07-31 MED ORDER — QUETIAPINE FUMARATE 200 MG PO TABS
200.0000 mg | ORAL_TABLET | Freq: Every day | ORAL | Status: DC
  Administered 2014-07-31 – 2014-08-05 (×6): 200 mg via ORAL
  Filled 2014-07-31: qty 4
  Filled 2014-07-31 (×7): qty 1

## 2014-07-31 MED ORDER — MIRTAZAPINE 30 MG PO TABS
30.0000 mg | ORAL_TABLET | Freq: Every day | ORAL | Status: DC
  Administered 2014-07-31 – 2014-08-05 (×6): 30 mg via ORAL
  Filled 2014-07-31: qty 4
  Filled 2014-07-31 (×7): qty 1

## 2014-07-31 MED ORDER — ONDANSETRON 4 MG PO TBDP: 4.0000 mg | ORAL_TABLET | Freq: Four times a day (QID) | ORAL | Status: DC | PRN

## 2014-07-31 MED ORDER — VITAMIN B-1 100 MG PO TABS
100.0000 mg | ORAL_TABLET | Freq: Every day | ORAL | Status: DC
  Administered 2014-08-01 – 2014-08-06 (×6): 100 mg via ORAL
  Filled 2014-07-31 (×8): qty 1

## 2014-07-31 MED ORDER — ADULT MULTIVITAMIN W/MINERALS CH
1.0000 | ORAL_TABLET | Freq: Every day | ORAL | Status: DC
  Administered 2014-07-31 – 2014-08-06 (×7): 1 via ORAL
  Filled 2014-07-31 (×11): qty 1

## 2014-07-31 NOTE — [facility] Group Notes (Signed)
[facility] Note   07/31/2014 8:38 AM  Participation Quality:  Called out to see the Dr     Roderic OvensNorth, Baldo Daubodney B

## 2014-07-31 NOTE — [facility] Group Notes (Signed)
[facility]  07/31/2014 , 12:21 PM    Type of Therapy:  [employer] Presentation  Participation Level:  Active  Participation Quality:  Attentive  Affect:  Blunted  Cognitive:  Oriented  Insight:  Limited  Engagement in Therapy:  Engaged  Modes of Intervention:  Discussion, Education and Socialization  Summary of Progress/Problems:  Onalee HuaDavid from [employer] came to present his recovery story and play the guitar.  Attended with interpreter.  Looked abit sedated, but engaged throughout.  Daryel Geraldorth, Moneka Mcquinn B 07/31/2014 , 12:21 PM

## 2014-07-31 NOTE — Progress Notes (Signed)
Pt presents with flat affect and depressed mood. Pt has minimal interaction on the unit. Pt rates depression 4/10, anxiety 5/10 and hopeless 6/10. Pt denies feeling suicidal today. Pt reports decreased tactile and auditory hallucinations. Pt compliant with taking meds and attending groups. No adverse reaction to meds verbalized by pt.  Medications administered as ordered per MD. Verbal support given. Pt encouraged to attend groups. 15 minute checks performed for safety. Pt safety maintained at this time. Pt receptive to treatment.

## 2014-07-31 NOTE — Tx Team (Signed)
  Interdisciplinary Treatment Plan Update   Date Reviewed:  07/31/2014  Time Reviewed:  8:17 AM  Progress in Treatment:   Attending groups: Yes Participating in groups: Yes Taking medication as prescribed: Yes  Tolerating medication: Yes Family/Significant other contact made: No  But pt signed release Patient understands diagnosis: Yes AEB asking for help with depression, SI, anxiety Discussing patient identified problems/goals with staff: Yes  See initial care plan Medical problems stabilized or resolved: Yes Denies suicidal/homicidal ideation: Yes  In tx team Patient has not harmed self or others: Yes  For review of initial/current patient goals, please see plan of care.  Estimated Length of Stay:  3-5 days  Reason for Continuation of Hospitalization: Depression Medication stabilization  New Problems/Goals identified:  N/A  Discharge Plan or Barriers:   return home, follow up outpt  Additional Comments:  Patient presents with complaints of suicidal ideation, PTSD, and worsening depression. Patient states that he is feeling a little better today since he has gotten some rest. Patient continue to endorse suicidal ideation but states that his mind is "little clearer; not as foggy and yesterday." Family called 911 because they have not heard from him for 2 todays. Per ems pt was hallucinating, co tremors. Pt reported not eating for 5 days  Was overusing benzos.  On librium taper.  Benzos will not be restarted.  Neurontin trial to supplement seroquel and anti depressant.  Attendees:  Signature: Ivin BootySarama Eappen, MD 07/31/2014 8:17 AM   Signature: Richelle Itood Shaindy Reader, LCSW 07/31/2014 8:17 AM  Signature: Fransisca KaufmannLaura Davis, NP 07/31/2014 8:17 AM  Signature:  07/31/2014 8:17 AM  Signature: Liborio NixonPatrice White, RN 07/31/2014 8:17 AM  Signature:  07/31/2014 8:17 AM  Signature:   07/31/2014 8:17 AM  Signature:    Signature:    Signature:    Signature:    Signature:    Signature:      Scribe for Treatment  Team:   Richelle Itood Coree Brame, LCSW  07/31/2014 8:17 AM
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
## 2014-07-31 NOTE — Progress Notes (Signed)
Adult Psychoeducational Group Note  Date:  07/31/2014 Time:  10:06 PM  Group Topic/Focus:  Wrap-Up Group:   The focus of this group is to help patients review their daily goal of treatment and discuss progress on daily workbooks.  Participation Level:  Active  Participation Quality:  Appropriate  Affect:  Appropriate  Cognitive:  Appropriate  Insight: Appropriate  Engagement in Group:  Engaged  Modes of Intervention:  Socialization and Support  Additional Comments:  Patient attended and participated in group with in Interpreter. He reports having a productive day. He spoke with his doctor and had some medication changes. 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  Today he went for his meals and attended his group. He especially liked the group where the presenter played the guitar. He advised that, although he could not hear the music, the faces of all who was in group tells him that everyone was relaxed and calmed by the music.  Tyler Horn, Tyler Horn [facility] 07/31/2014, 10:06 PM
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
## 2014-08-01 NOTE — [facility] Group Notes (Signed)
[facility] LCSW Group Therapy  08/01/2014 10:58 AM   Type of Therapy:  Group Therapy  Participation Level:  Invited.  Did not attend  Summary of Progress/Problems: Today's group focused on relapse prevention.  We defined the term, and then brainstormed on ways to prevent relapse.  Daryel Geraldorth, Alexandre Faries B 08/01/2014 , 10:58 AM

## 2014-08-01 NOTE — Progress Notes (Signed)
[facility] Group Notes:  (Nursing/MHT/Case Management/Adjunct)  Date:  08/01/2014  Time:  10:50 PM  Type of Therapy:  Group Therapy  Participation Level:  Did Not Attend  Participation Quality:  Did Not Attend  Affect:  Did Not Attend  Cognitive:  Did Not Attend  Insight:  None  Engagement in Group:  Did Not Attend  Modes of Intervention:  Socialization and Support  Summary of Progress/Problems: Pt. Was resting in bed.   Sondra ComeWilson, Sigismund Cross J 08/01/2014, 10:50 PM

## 2014-08-01 NOTE — Progress Notes (Signed)
D. Pt had been up and visible in milieu this evening, attended and participated in evening group session with interpreter present. Pt communicated about his day and how it went well. Pt looked anxious and communicated feelings as such. Pt did not communicate any complaints of pain and did receive medications without incident. A. Support and encouragement provided. R. Safety maintained, will continue to monitor.

## 2014-08-01 NOTE — B[facility] Suicide Risk Assessment (Signed)
[facility] INPATIENT:  Family/Significant Other Suicide Prevention Education  Suicide Prevention Education:  Education Completed; Denzil MagnusonJoann Jackson, [phone] has been identified by the patient as the family member/significant other with whom the patient will be residing, and identified as the person(s) who will aid the patient in the event of a mental health crisis (suicidal ideations/suicide attempt).  With written consent from the patient, the family member/significant other has been provided the following suicide prevention education, prior to the and/or following the discharge of the patient.  The suicide prevention education provided includes the following:  Suicide risk factors  Suicide prevention and interventions  National Suicide Hotline telephone number  [facility] assessment telephone number  [facility] Emergency Assistance 911  [facility] and/or Residential Mobile Crisis Unit telephone number  Request made of family/significant other to:  Remove weapons (e.g., guns, rifles, knives), all items previously/currently identified as safety concern.    Remove drugs/medications (over-the-counter, prescriptions, illicit drugs), all items previously/currently identified as a safety concern.  The family member/significant other verbalizes understanding of the suicide prevention education information provided.  The family member/significant other agrees to remove the items of safety concern listed above.  Hyatt,Candace 08/01/2014, 11:52 AM

## 2014-08-01 NOTE — B[facility] Counselor (Signed)
CSW Intern contacted Tyler Horn Tyler Horn [phone]. She reported that the patient isolates himself from family and friends. He does not communicate with them until he needs help. She reported the the patient was hospitalized for depression about a year ago. His supervisor [facility] ago due to depression; he was not admitted.

## 2014-08-01 NOTE — Progress Notes (Signed)
[facility] MD Progress Note  08/01/2014 12:21 PM Tyler Horn  MRN:  [id number] Subjective: Patient states,' I feel better."  Objective: ASL interpretor Loraine Leriche was made use of since patient is deaf. 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 Patient on presentation also reported tactile hallucinations of bugs crawling on him as well as AH of noises. Patient reported abusing his klonopin that was prescribed to him ,finally he ran out of his medication was withdrawn ,laid in bed for days without taking his meals resulting in severe decompensation.  Patient with multiple psychosocial stressors like medical problems ,high work load ,HIV diagnosis as well as past hx of trauma.  Patient today reports some improvement in his sx. Reports sleep as better and anxiety as coming down. Patient had multiple trials of anti anxiety as well as antidepressants in the past and hence Luvox was started since he never had that trial. Patient today reports that he likes his new medication and feels as though it is effective. However pt reports some loose stool, unknown if this is 2/2 his withdrawal or luvox. However will monitor. Pt to attend groups as well as participate in milieu. Pt denies SI/HI today ,improvement in his hallucinations.  Diagnosis:   DSM5: Primary Psychiatric Diagnosis: MDD,recurrent ,severe without psychosis   Secondary Psychiatric Diagnosis: Generalized anxiety disorder Benzodiazepine use disorder,moderate Benzodiazepine withdrawal with perceptual disturbances.   Non Psychiatric Diagnosis: HIV Positive UTI    Total Time spent with patient: 45 minutes   ADL's:  Intact  Sleep: Fair  Appetite:  Fair   Psychiatric Specialty Exam: Physical Exam  ROS  Blood pressure 98/55, pulse 111, temperature 97.5 F (36.4 C), temperature source Oral, resp. rate 18, SpO2 100 %.There is no height or weight on  file to calculate BMI.  General Appearance: Fairly Groomed  Patent attorney::  Fair  Speech:  Clear and Coherent  Volume:  Normal  Mood:  Anxious and Depressed  Affect:  Labile  Thought Process:  Goal Directed  Orientation:  Full (Time, Place, and Person)  Thought Content:  Hallucinations: Tactile improving  Suicidal Thoughts:  No  Homicidal Thoughts:  No  Memory:  Immediate;   Fair Recent;   Fair Remote;   Fair  Judgement:  Fair  Insight:  Shallow  Psychomotor Activity:  Restlessness improving  Concentration:  Fair  Recall:  [employer] of Knowledge:Fair  Language: Good  Akathisia:  No  Handed:  Right  AIMS (if indicated):     Assets:  Communication Skills Desire for Improvement  Sleep:  Number of Hours: 6.75   Musculoskeletal: Strength & Muscle Tone: within normal limits Gait & Station: normal Patient leans: N/A  Current Medications: Current Facility-Administered Medications  Medication Dose Route Frequency Provider Last Rate Last Dose  . cephALEXin (KEFLEX) capsule 500 mg  500 mg Oral 4 times per day Shuvon Rankin, NP   500 mg at 08/01/14 1212  . chlordiazePOXIDE (LIBRIUM) capsule 25 mg  25 mg Oral Q6H PRN Jomarie Longs, MD      . chlordiazePOXIDE (LIBRIUM) capsule 25 mg  25 mg Oral TID Jomarie Longs, MD   25 mg at 08/01/14 1212   Followed by  . [START ON 08/02/2014] chlordiazePOXIDE (LIBRIUM) capsule 25 mg  25 mg Oral BH-qamhs Jomarie Longs, MD       Followed by  . [START ON 08/03/2014] chlordiazePOXIDE (LIBRIUM) capsule 25 mg  25  mg Oral Daily Jomarie LongsSaramma Claudy Abdallah, MD      . emtricitabine-tenofovir (TRUVADA) 200-300 MG per tablet 1 tablet  1 tablet Oral Daily Shuvon Rankin, NP   1 tablet at 08/01/14 0804  . fluvoxaMINE (LUVOX) tablet 25 mg  25 mg Oral Daily Jomarie LongsSaramma Jaleil Renwick, MD   25 mg at 08/01/14 0804  . gabapentin (NEURONTIN) capsule 100 mg  100 mg Oral TID Jomarie LongsSaramma Victor Granados, MD   100 mg at 08/01/14 1212  . hydrOXYzine (ATARAX/VISTARIL) tablet 25 mg  25 mg Oral Q6H PRN Jomarie LongsSaramma  Mikaelyn Arthurs, MD      . loperamide (IMODIUM) capsule 2-4 mg  2-4 mg Oral PRN Jomarie LongsSaramma Ardian Haberland, MD      . magnesium hydroxide (MILK OF MAGNESIA) suspension 30 mL  30 mL Oral Daily PRN Shuvon Rankin, NP      . mirtazapine (REMERON) tablet 30 mg  30 mg Oral QHS Tameca Jerez, MD   30 mg at 07/31/14 2112  . multivitamin with minerals tablet 1 tablet  1 tablet Oral Daily Jomarie LongsSaramma Fumiye Lubben, MD   1 tablet at 08/01/14 0805  . nevirapine [facility](VIRAMUNE) tablet 200 mg  200 mg Oral Daily Shuvon Rankin, NP   200 mg at 08/01/14 0805  . [START ON 08/12/2014] Nevirapine TB24 1 tablet  1 tablet Oral Daily Shuvon Rankin, NP      . ondansetron (ZOFRAN-ODT) disintegrating tablet 4 mg  4 mg Oral Q6H PRN Vinton Layson, MD      . QUEtiapine (SEROQUEL) tablet 200 mg  200 mg Oral QHS Jomarie LongsSaramma Shamarra Warda, MD   200 mg at 07/31/14 2112  . thiamine (B-1) injection 100 mg  100 mg Intramuscular Once Jomarie LongsSaramma Shareta Fishbaugh, MD   100 mg at 07/31/14 1700  . thiamine (VITAMIN B-1) tablet 100 mg  100 mg Oral Daily Jomarie LongsSaramma Brinly Maietta, MD   100 mg at 08/01/14 0805    Lab Results: No results found for this or any previous visit (from the past 48 hour(s)).  Physical Findings: AIMS: Facial and Oral Movements Muscles of Facial Expression: None, normal Lips and Perioral Area: None, normal Jaw: None, normal Tongue: None, normal,Extremity Movements Upper (arms, wrists, hands, fingers): None, normal Lower (legs, knees, ankles, toes): None, normal, Trunk Movements Neck, shoulders, hips: None, normal, Overall Severity Severity of abnormal movements (highest score from questions above): None, normal Incapacitation due to abnormal movements: None, normal Patient's awareness of abnormal movements (rate only patient's report): No Awareness,    CIWA:  CIWA-Ar Total: 0 COWS:     Treatment Plan Summary: Daily contact with patient to assess and evaluate symptoms and progress in treatment Medication management  Plan:  Will taper off Lexapro for lack of  efficacy. Will continue Luvox 25 mg po daily . Will continue Buspar as well as Gabapentin for anxiety sx. Will continue CIWA/Librium protocol for possible withdrawal sx from klonopin ,pt was abusing it at home. Will continue Seroquel to 200 mg po qhs for sleep and Hallucinations. Will continue Remeron to 30 mg po qhs .  Reviewed past medical records,treatment plan.  Will continue to monitor vitals ,medication compliance and treatment side effects while patient is here.  Will monitor for medical issues as well as call consult as needed. Will continue HIV medications as scheduled. Will continue Keflex for UTI as scheduled.  Reviewed labs ,will order as needed.  CSW will start working on disposition.  Patient to participate in therapeutic milieu .     Medical Decision Making Problem Points:  Established problem, stable/improving (1), Review of  last therapy session (1) and Review of psycho-social stressors (1) Data Points:  Review or order clinical lab tests (1) Review of medication regiment & side effects (2) Review of new medications or change in dosage (2)  I certify that inpatient services furnished can reasonably be expected to improve the patient's condition.   Jamariyah Johannsen MD 08/01/2014, 12:21 PM

## 2014-08-01 NOTE — Progress Notes (Signed)
Psychoeducational Group Note  Date:  08/01/2014 Time:  1116  Group Topic/Focus:  Self Esteem Action Plan:   The focus of this group is to help patients create a plan to continue to build self-esteem after discharge.  Participation Level: Did Not Attend  Participation Quality:  Not Applicable  Affect:  Not Applicable  Cognitive:  Not Applicable  Insight:  Not Applicable  Engagement in Group: Not Applicable  Additional Comments:  Pt was unable to attend group due to fact that the pt was given medications and was asleep.  Delma Drone E 08/01/2014, 11:14 AM

## 2014-08-01 NOTE — Progress Notes (Signed)
D: Pt presents with flat affect and depressed mood. Pt c/o nausea and loose stool this morning. Writer encouraged pt to Lexicographernotify writer of next episode and to save BM in toilet for writer to assess. Pt agreed. Pt currently resting in bed at this time. Pt was brought back lunch from cafeteria d/t not feeling well. Writer observed pt eating lunch. Pt denies SI/HI/AVH. Pt compliant with taking meds and no adverse reaction to meds verbalized by pt.  A: Medications administered as ordered per MD. Verbal support given. Pt encouraged to attend groups. 15 minute checks performed for safety.  R: Pt appears sad and often minimizes his emotions.

## 2014-08-02 MED ORDER — GABAPENTIN 300 MG PO CAPS
300.0000 mg | ORAL_CAPSULE | Freq: Three times a day (TID) | ORAL | Status: DC
  Administered 2014-08-02 – 2014-08-06 (×12): 300 mg via ORAL
  Filled 2014-08-02 (×5): qty 1
  Filled 2014-08-02 (×2): qty 12
  Filled 2014-08-02 (×8): qty 1
  Filled 2014-08-02: qty 12
  Filled 2014-08-02: qty 1

## 2014-08-02 MED ORDER — FLUVOXAMINE MALEATE 50 MG PO TABS
50.0000 mg | ORAL_TABLET | Freq: Every day | ORAL | Status: DC
  Administered 2014-08-03 – 2014-08-05 (×3): 50 mg via ORAL
  Filled 2014-08-02 (×4): qty 1

## 2014-08-02 NOTE — Progress Notes (Signed)
Patient ID: Annice PihSteven Koeppen, male   DOB: 10/15/1960, 53 y.o.   MRN: [id number] D. Patient presents with depressed mood, affect anxious. Pt assessed with help of ASL interpreter Kenard Gowerrew . Pt states he is very anxious stating '' I feel very anxious like I am going to have a panic attack. I am shaky and sweaty. I can't come to group today, I feel too restless to even sit in the chair and I know I will disrupt things'' Patient denies any SI/HI/A/V Hallucinations. Pt is able to contract for safety. Pt denies any other acute concerns. A. Medications given as ordered. Support and encouragement provided. Discussed above with Dr. Elna BreslowEappen /treatment team. R. Patient is safe, in no acute distress at this time. Will continue to monitor q 15 minutes for safety..Marland Kitchen

## 2014-08-02 NOTE — Progress Notes (Signed)
D. Pt was in room and in bed for the duration of the shift. Pt was able to communicate that he just wasn't feeling well today and was tired and needed to get some rest. Pt reported that he wasn't able to eat much today but was able to drink ok and reported that he just didn't feel well and how some rest would do him good so he could be active in the milieu tomorrow. Pt did receive medications without incident. A. Support and encouragement provided. R. Safety maintained, will continue to monitor.

## 2014-08-02 NOTE — [facility] Group Notes (Signed)
[facility]   08/02/2014 11:09 AM  Participation Quality:  Did not attend    [place], Tyler Horn

## 2014-08-02 NOTE — [facility] Group Notes (Signed)
[facility] LCSW Group Therapy  08/02/2014 3:56 PM  Type of Therapy:  Group Therapy  Participation Level:  Active  Participation Quality:  Attentive  Affect:  Appropriate  Cognitive:  Alert and Oriented  Insight:  Engaged  Engagement in Therapy:  Engaged  Modes of Intervention:  Confrontation, Discussion, Education, Exploration, Problem-solving, Rapport Building, Socialization and Support  Summary of Progress/Problems: OrthoptistChaplain and intern facilitated today's group. Group members were asked to choose a card that represented how they feel today. Group members were then asked to share why they chose these card and what the picture symbolized to them. Group members were encouraged to process each members' card and how it relates to their experience. Tyler Horn was attentive and engaged during today's processing group. Tyler Horn provided emotional support and advice from a social work standpoint to several patients. Other group members expressed gratitude to Tyler Horn for his support. Tyler Horn described his card (picture of white and pink cupcakes) to the group. "I chose this because first of all, I love sweets. I also try to be a sweet person and think that these cupcakes represent all of us because we are all sweet people." Tyler Horn went on to discuss how he finds hope and how his past experiences give him hope for the future.   Smart, [name] LCSWA  08/02/2014, 3:56 PM

## 2014-08-02 NOTE — Progress Notes (Signed)
[facility] MD Progress Note  08/02/2014 12:49 PM Tyler Horn  MRN:  [id number] Subjective: Patient states,' I feel very anxious". Objective: ASL interpretor Winona Legato was made use of since patient is deaf. 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 Patient on presentation also reported tactile hallucinations of bugs crawling on him as well as AH of noises. Patient reported abusing his klonopin that was prescribed to him ,finally he ran out of his medication was withdrawn ,laid in bed for days without taking his meals resulting in severe decompensation.  Patient with multiple psychosocial stressors like medical problems ,high work load ,HIV diagnosis as well as past hx of trauma.  Patient today reports panic attacks and reports that he does not know why he feels this way.Pt was seen in bed ,very anxious ,trying to deep breathe. Pt reports that he is trying to deal with it by deep breathing as well as using his coping skills. Patient later on was given his AM dose of medications and seemed to help. Patient otherwise reports withdrawal sx like "sweats " as well as diarrhea. His diarrhea however subsided last evening and he has not had any since then.Pt reports improvement in his hallucinations.  Patient has been missing groups and has been encouraged to make these groups as much as possible. Pt denies SI/HI/AH/VH.  Per CSW -collateral information from mother was obtained ,per mother pt has had a few recent admissions for similar complaints ,most recent one was at [facility]. Patient gets withdrawn ,stops contacting family and isolates himself during those periods.    Diagnosis:   DSM5: Primary Psychiatric Diagnosis: MDD,recurrent ,severe without psychosis   Secondary Psychiatric Diagnosis: Generalized anxiety disorder Benzodiazepine use disorder,moderate Benzodiazepine withdrawal with perceptual  disturbances.   Non Psychiatric Diagnosis: HIV Positive UTI    Total Time spent with patient: 45 minutes   ADL's:  Intact  Sleep: Fair  Appetite:  Fair   Psychiatric Specialty Exam: Physical Exam  ROS  Blood pressure 96/58, pulse 107, temperature 97.5 F (36.4 C), temperature source Oral, resp. rate 18, SpO2 100 %.There is no height or weight on file to calculate BMI.  General Appearance: Fairly Groomed  Patent attorney::  Fair  Speech:  Clear and Coherent  Volume:  Normal  Mood:  Anxious and Depressed was having a panic attack this AM . Patient seen in bed deep breathing.  Affect:  Labile  Thought Process:  Goal Directed  Orientation:  Full (Time, Place, and Person)  Thought Content:  Hallucinations: Tactile improving  Suicidal Thoughts:  No  Homicidal Thoughts:  No  Memory:  Immediate;   Fair Recent;   Fair Remote;   Fair  Judgement:  Fair  Insight:  Shallow  Psychomotor Activity:  Restlessness improving  Concentration:  Fair  Recall:  [employer] of Knowledge:Fair  Language: Good  Akathisia:  No  Handed:  Right  AIMS (if indicated):     Assets:  Communication Skills Desire for Improvement  Sleep:  Number of Hours: 6.75   Musculoskeletal: Strength & Muscle Tone: within normal limits Gait & Station: normal Patient leans: N/A  Current Medications: Current Facility-Administered Medications  Medication Dose Route Frequency Provider Last Rate Last Dose  . cephALEXin (KEFLEX) capsule 500 mg  500 mg Oral 4 times per day Shuvon Rankin, NP   500 mg at 08/02/14 1143  . chlordiazePOXIDE (LIBRIUM) capsule 25 mg  25 mg Oral Q6H PRN Jomarie LongsSaramma Ivoree Felmlee, MD   25 mg at 08/02/14 0846  . chlordiazePOXIDE (LIBRIUM) capsule 25 mg  25 mg Oral BH-qamhs Grazia Taffe, MD   0 mg at 08/02/14 0848  . emtricitabine-tenofovir (TRUVADA) 200-300 MG per tablet 1 tablet  1 tablet Oral Daily Shuvon Rankin, NP   1 tablet at 08/02/14 0845  . [START ON 08/03/2014] fluvoxaMINE (LUVOX) tablet 50 mg   50 mg Oral Daily Runell Kovich, MD      . gabapentin (NEURONTIN) capsule 300 mg  300 mg Oral TID Jomarie LongsSaramma Tiombe Tomeo, MD      . hydrOXYzine (ATARAX/VISTARIL) tablet 25 mg  25 mg Oral Q6H PRN Jomarie LongsSaramma Laurinda Carreno, MD   25 mg at 08/02/14 1230  . loperamide (IMODIUM) capsule 2-4 mg  2-4 mg Oral PRN Jomarie LongsSaramma Marque Bango, MD      . magnesium hydroxide (MILK OF MAGNESIA) suspension 30 mL  30 mL Oral Daily PRN Shuvon Rankin, NP      . mirtazapine (REMERON) tablet 30 mg  30 mg Oral QHS Jomarie LongsSaramma Zerenity Bowron, MD   30 mg at 08/01/14 2130  . multivitamin with minerals tablet 1 tablet  1 tablet Oral Daily Jomarie LongsSaramma Dean Goldner, MD   1 tablet at 08/02/14 0844  . nevirapine [facility](VIRAMUNE) tablet 200 mg  200 mg Oral Daily Shuvon Rankin, NP   200 mg at 08/02/14 0845  . [START ON 08/12/2014] Nevirapine TB24 1 tablet  1 tablet Oral Daily Shuvon Rankin, NP      . ondansetron (ZOFRAN-ODT) disintegrating tablet 4 mg  4 mg Oral Q6H PRN Albirda Shiel, MD      . QUEtiapine (SEROQUEL) tablet 200 mg  200 mg Oral QHS Treon Kehl, MD   200 mg at 08/01/14 2130  . thiamine (B-1) injection 100 mg  100 mg Intramuscular Once Jomarie LongsSaramma Clinton Wahlberg, MD   100 mg at 07/31/14 1700  . thiamine (VITAMIN B-1) tablet 100 mg  100 mg Oral Daily Jomarie LongsSaramma Coraleigh Sheeran, MD   100 mg at 08/02/14 0844    Lab Results: No results found for this or any previous visit (from the past 48 hour(s)).  Physical Findings: AIMS: Facial and Oral Movements Muscles of Facial Expression: None, normal Lips and Perioral Area: None, normal Jaw: None, normal Tongue: None, normal,Extremity Movements Upper (arms, wrists, hands, fingers): None, normal Lower (legs, knees, ankles, toes): None, normal, Trunk Movements Neck, shoulders, hips: None, normal, Overall Severity Severity of abnormal movements (highest score from questions above): None, normal Incapacitation due to abnormal movements: None, normal Patient's awareness of abnormal movements (rate only patient's report): No Awareness,    CIWA:   CIWA-Ar Total: 3 COWS:     Treatment Plan Summary: Daily contact with patient to assess and evaluate symptoms and progress in treatment Medication management  Plan:  Will taper off Lexapro for lack of efficacy. Will increase Luvox to 50 mg po daily . Will increase Gabapentin to 300 mg po tid for anxiety sx. Will continue CIWA/Librium protocol for possible withdrawal sx from klonopin ,pt was abusing it at home. Will continue Seroquel  200 mg po qhs for sleep and Hallucinations. Will continue Remeron  30 mg po qhs .Could titrate up higher. Patient came in with Lexapro as well as Remeron. Lexapro was tapered off and Remeron was titrated higher and Luvox added.  Reviewed past medical records,treatment plan.  Will continue to monitor vitals ,medication compliance and treatment side effects while patient is here.  Will monitor for medical issues as well as call consult  as needed. Will continue HIV medications as scheduled. Will continue Keflex for UTI as scheduled.  Reviewed labs ,will order as needed.  CSW will start working on disposition.  Patient to participate in therapeutic milieu .     Medical Decision Making Problem Points:  Established problem, stable/improving (1), Review of last therapy session (1) and Review of psycho-social stressors (1) Data Points:  Review or order clinical lab tests (1) Review of medication regiment & side effects (2) Review of new medications or change in dosage (2)  I certify that inpatient services furnished can reasonably be expected to improve the patient's condition.   Zakhai Meisinger MD 08/02/2014, 12:49 PM

## 2014-08-03 DIAGNOSIS — F332 Major depressive disorder, recurrent severe without psychotic features: Principal | ICD-10-CM

## 2014-08-03 DIAGNOSIS — F13232 Sedative, hypnotic or anxiolytic dependence with withdrawal with perceptual disturbance: Secondary | ICD-10-CM

## 2014-08-03 NOTE — Progress Notes (Signed)
Adult Psychoeducational Group Note  Date:  08/03/2014 Time:  9:51 PM  Group Topic/Focus:  Wrap-Up Group:   The focus of this group is to help patients review their daily goal of treatment and discuss progress on daily workbooks.  Participation Level:  Active  Participation Quality:  Appropriate  Affect:  Appropriate  Cognitive:  Appropriate  Insight: Appropriate  Engagement in Group:  Engaged  Modes of Intervention:  Discussion  Additional Comments: The patient expressed that he learned from group that we control our feelings.The patient also said that he was looking forward to a better day.  Octavio Mannshigpen, Madalene Mickler Lee 08/03/2014, 9:51 PM

## 2014-08-03 NOTE — [facility] Group Notes (Signed)
[facility] Group Notes:  (Nursing/MHT/Case Management/Adjunct)  Date:  08/03/2014  Time:  0930 am  Type of Therapy:  Psychoeducational Skills  Participation Level:  Active  Participation Quality:  Appropriate  Affect:  Appropriate  Cognitive:  Appropriate  Insight:  Improving  Engagement in Group:  Improving  Modes of Intervention:  Support  Summary of Progress/Problems:  Tyler Horn, Tyler Horn 08/03/2014, 10:51 AM

## 2014-08-03 NOTE — Progress Notes (Signed)
D. Pt has been up and visible in milieu this evening, pt was able to communicate about his day and spoke about how it was better than yesterday, however pt is still feeling anxious and has been dealing with these feelings throughout the course of the day. Pt spoke about ways that he was dealing with his anxiety and has appeared anxious in the milieu this evening. Pt did receive medications without incident. A. Support and encouragement provided. R. Safety maintained, will continue to monitor.

## 2014-08-03 NOTE — Progress Notes (Signed)
[facility]  08/03/2014 2:25 PM Annice PihSteven Galanti  MRN:  [id number] Subjective: Patient states, "I feel much better today". Objective: ASL interpretor was made use of since patient is deaf. 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 Patient on presentation also reported tactile hallucinations of bugs crawling on him as well as AH of noises. Patient reported abusing his klonopin that was prescribed to him ,finally he ran out of his medication was withdrawn ,laid in bed for days without taking his meals resulting in severe decompensation.  Patient with multiple psychosocial stressors like medical problems ,high work load ,HIV diagnosis as well as past hx of trauma.  Pt completed librium protocol yesterday. Pt reported having some withdrawal symptoms yesterday (anxiety/sweating), but denies withdrawal symptoms today. Pt just took a nap. Tolerating increased neurontin dose well. Pt reports "I feel like myself now".  Pt denies SI/HI/AH/VH.    Diagnosis:   DSM5: Primary Psychiatric Diagnosis: MDD,recurrent ,severe without psychosis   Secondary Psychiatric Diagnosis: Generalized anxiety disorder Benzodiazepine use disorder,moderate Benzodiazepine withdrawal with perceptual disturbances.   Non Psychiatric Diagnosis: HIV Positive UTI    Total Time spent with patient: 45 minutes   ADL's:  Intact  Sleep: Fair  Appetite:  Fair   Psychiatric Specialty Exam: Physical Exam  ROS  Blood pressure 122/68, pulse 96, temperature 98 F (36.7 C), temperature source Oral, resp. rate 18, SpO2 98 %.There is no height or weight on file to calculate BMI.  General Appearance: Fairly Groomed  Patent attorneyye Contact::  Fair  Speech:  Clear and Coherent  Volume:  Normal  Mood:  Euthymic   Affect:  Appropriate, Congruent and Full Range  Thought Process:  Goal Directed  Orientation:  Full (Time, Place, and  Person)  Thought Content:  Hallucinations: Tactile improving  Suicidal Thoughts:  No  Homicidal Thoughts:  No  Memory:  Immediate;   Fair Recent;   Fair Remote;   Fair  Judgement:  Fair  Insight:  Shallow  Psychomotor Activity:  Restlessness improving  Concentration:  Fair  Recall:  FiservFair  Fund of Knowledge:Fair  Language: Good  Akathisia:  No  Handed:  Right  AIMS (if indicated):     Assets:  Communication Skills Desire for Improvement  Sleep:  Number of Hours: 6.75   Musculoskeletal: Strength & Muscle Tone: within normal limits Gait & Station: normal Patient leans: N/A  Current Medications: Current Facility-Administered Medications  Medication Dose Route Frequency Provider Last Rate Last Dose  . cephALEXin (KEFLEX) capsule 500 mg  500 mg Oral 4 times per day Shuvon Rankin, NP   500 mg at 08/03/14 1214  . emtricitabine-tenofovir (TRUVADA) 200-300 MG per tablet 1 tablet  1 tablet Oral Daily Shuvon Rankin, NP   1 tablet at 08/03/14 [phone] [id number]  . fluvoxaMINE (LUVOX) tablet 50 mg  50 mg Oral Daily Jomarie LongsSaramma Eappen, MD   50 mg at 08/03/14 0837  . gabapentin (NEURONTIN) capsule 300 mg  300 mg Oral TID Jomarie LongsSaramma Eappen, MD   300 mg at 08/03/14 1214  . magnesium hydroxide (MILK OF MAGNESIA) suspension 30 mL  30 mL Oral Daily PRN Shuvon Rankin, NP      . mirtazapine (REMERON) tablet 30 mg  30 mg Oral QHS Jomarie LongsSaramma Eappen, MD   30 mg at 08/02/14 2136  . multivitamin with minerals tablet 1 tablet  1 tablet Oral Daily Jomarie LongsSaramma Eappen, MD   1 tablet  at 08/03/14 0837  . nevirapine [facility](VIRAMUNE) tablet 200 mg  200 mg Oral Daily Shuvon Rankin, NP   200 mg at 08/03/14 0837  . [START ON 08/12/2014] Nevirapine TB24 1 tablet  1 tablet Oral Daily Shuvon Rankin, NP      . QUEtiapine (SEROQUEL) tablet 200 mg  200 mg Oral QHS Jomarie LongsSaramma Eappen, MD   200 mg at 08/02/14 2136  . thiamine (B-1) injection 100 mg  100 mg Intramuscular Once Jomarie LongsSaramma Eappen, MD   100 mg at 07/31/14 1700  . thiamine (VITAMIN B-1) tablet 100 mg   100 mg Oral Daily Jomarie LongsSaramma Eappen, MD   100 mg at 08/03/14 [id number]    Lab Results: No results found for this or any previous visit (from the past 48 hour(s)).  Physical Findings: AIMS: Facial and Oral Movements Muscles of Facial Expression: None, normal Lips and Perioral Area: None, normal Jaw: None, normal Tongue: None, normal,Extremity Movements Upper (arms, wrists, hands, fingers): None, normal Lower (legs, knees, ankles, toes): None, normal, Trunk Movements Neck, shoulders, hips: None, normal, Overall Severity Severity of abnormal movements (highest score from questions above): None, normal Incapacitation due to abnormal movements: None, normal Patient's awareness of abnormal movements (rate only patient's report): No Awareness,    CIWA:  CIWA-Ar Total: 2 COWS:     Treatment Plan Summary: Daily contact with patient to assess and evaluate symptoms and progress in treatment Medication management  Plan:   Cont Luvox  50 mg po daily . Cont Gabapentin 300 mg po tid for anxiety sx. CIWA/Librium protocol completed yesterday. Will continue Seroquel  200 mg po qhs for sleep and Hallucinations. Will continue Remeron  30 mg po qhs .Could titrate up higher. Patient came in with Lexapro as well as Remeron. Lexapro was tapered off and Remeron was titrated higher and Luvox added.  Reviewed past medical records,treatment plan.  Will continue to monitor vitals ,medication compliance and treatment side effects while patient is here.  Will monitor for medical issues as well as call consult as needed. Will continue HIV medications as scheduled. Will continue Keflex for UTI as scheduled.  Reviewed labs ,will order as needed.  CSW will start working on disposition.  Patient to participate in therapeutic milieu .     Medical Decision Making Problem Points:  Established problem, stable/improving (1), Review of last therapy session (1) and Review of psycho-social stressors (1) Data Points:   Review or order clinical lab tests (1) Review of medication regiment & side effects (2) Review of new medications or change in dosage (2)  I certify that inpatient services furnished can reasonably be expected to improve the patient's condition.   Ancil LinseySARANGA, Clarrissa Shimkus MD 08/03/2014, 2:25 PM

## 2014-08-03 NOTE — Progress Notes (Signed)
Patient ID: Tyler Horn, male   DOB: Aug 21, 1961, 53 y.o.   MRN: [id number] D. Patient presents with depressed mood, affect anxious again today. Tyler Horn assessed with help of ASL interpreter Tyler Horn this am . Tyler Horn states his anxiety is improving but continues to report feeling anxious at times. He has been more interactive on the unit with peers and staff, attending unit programming. He states '' I think the new medications are helping me '' Patient denies any SI/HI/A/V Hallucinations. Pt is able to contract for safety. Pt denies any other acute concerns. A. Medications given as ordered. Support and encouragement provided. Discussed above with Dr. Tawni CarnesSaranga. R. Patient is safe, in no acute distress at this time. Will continue to monitor q 15 minutes for safety..Marland Kitchen

## 2014-08-03 NOTE — [facility] Group Notes (Signed)
[place] LCSW Group Therapy Note  08/03/2014 11:15 AM  Type of Therapy and Topic:  Group Therapy: Avoiding Self-Sabotaging and Enabling Behaviors  Participation Level:  Active  Mood: Engaged  Description of Group:     Learn how to identify obstacles, self-sabotaging and enabling behaviors, what are they, why do we do them and what needs do these behaviors meet? Discuss unhealthy relationships and how to have positive healthy boundaries with those that sabotage and enable. Explore aspects of self-sabotage and enabling in yourself and how to limit these self-destructive behaviors in everyday life. A scaling question is used to help patient look at where they are now in their motivation to change, from 1 to 10 (lowest to highest motivation).  Therapeutic Goals: 1. Patient will identify one obstacle that relates to self-sabotage and enabling behaviors 2. Patient will identify one personal self-sabotaging or enabling behavior they did prior to admission 3. Patient able to establish a plan to change the above identified behavior they did prior to admission:  4. Patient will demonstrate ability to communicate their needs through discussion and/or role plays.   Summary of Patient Progress: The main focus of today's process group was to discuss what "self-sabotage" means and use Motivational Interviewing to discuss what benefits, negative or positive, were involved in a self-identified self-sabotaging behavior. We then talked about reasons the patient may want to change the behavior and his/her current desire to change. A scaling question was used to help patient look at where they are now in motivation for change, from 1 to 10 (lowest to highest motivation).  Patient shared during group warm up he is looking forward to enjoying the holidays and the coming spring. He shared frequently during group session through use of interpreter. 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 CSW met with pt and interpreter following group as he was concerned that his statements were not taken as factual concerning mental health issues and substance abuse. Patient seemed pleased with results of discussion.    Therapeutic Modalities:   Cognitive Behavioral Therapy Person-Centered Therapy Motivational Interviewing   Sheilah Pigeon, LCSW

## 2014-08-04 MED ORDER — LOPERAMIDE HCL 2 MG PO CAPS
2.0000 mg | ORAL_CAPSULE | ORAL | Status: DC | PRN
  Administered 2014-08-04 – 2014-08-05 (×2): 2 mg via ORAL
  Filled 2014-08-04: qty 1

## 2014-08-04 MED ORDER — LOPERAMIDE HCL 2 MG PO CAPS
ORAL_CAPSULE | ORAL | Status: AC
  Administered 2014-08-04: 2 mg
  Filled 2014-08-04: qty 1

## 2014-08-04 NOTE — Progress Notes (Signed)
Patient ID: [name], male   DOB: September 20, 1960, 53 y.o.   MRN: [id number] D. Patient presents with depressed mood, affect congruent.Tyler Horn. [name] assessed with help of ASL interpreter today . Tyler Horn states his mood is good and his anxiety is improved. Patient denies any SI/HI/A/V Hallucinations. Pt is able to contract for safety. Pt denies any other acute concerns. A. Medications given as ordered. Support and encouragement provided. Discussed above with Dr. Tawni CarnesSaranga. R. Patient is safe, in no acute distress at this time. Will continue to monitor q 15 minutes for safety.

## 2014-08-04 NOTE — [facility] Group Notes (Signed)
[facility] Group Notes:  (Nursing/MHT/Case Management/Adjunct)  Date:  08/04/2014  Time:  9:30am  Type of Therapy:  Nurse Education - Patient Self Inventory  Participation Level:  Active  Participation Quality:  Appropriate  Affect:  Appropriate  Cognitive:  Oriented  Insight:  Good  Engagement in Group:  Improving  Modes of Intervention:  Discussion, Education and Support  Summary of Progress/Problems: Patient reports "I'm pretty good. Much better than the first day I was admitted." No concerns voiced to team today.  Lawrence MarseillesFriedman, Selina Tapper Eakes 08/04/2014, 3:15 PM

## 2014-08-04 NOTE — [facility] Group Notes (Signed)
[facility] LCSW Group Therapy 08/04/2014 1:15pm  Type of Therapy: Group Therapy- Feelings Around Discharge & Establishing a Supportive Framework  Participation Level: Active   Modes of Intervention: Clarification, Confrontation, Discussion, Education, Exploration, Limit-setting, Orientation, Problem-solving, Rapport Building, Dance movement psychotherapisteality Testing, Socialization and Support   Description of Group:   What is a supportive framework? What does it look like feel like and how do I discern it from and unhealthy non-supportive network? Learn how to cope when supports are not helpful and don't support you. Discuss what to do when your family/friends are not supportive.  Summary of Patient Progress Interpreter present for Pt to participate in group discussion. Pt was engaged and appropriately discussed characteristics of both positive and negative supports. Pt demonstrates understanding of mental health resources in the community as he was able to provide suggestions to peers about positive supports in the community. Pt demonstrates insight AEB his ability to process the necessity of positive supports and the negative impacts of unhealthy relationships.  Therapeutic Modalities:   Cognitive Behavioral Therapy Person-Centered Therapy Motivational Interviewing   Chad CordialLauren Carter, LCSWA 08/04/2014 2:59 PM

## 2014-08-04 NOTE — Progress Notes (Signed)
D Pt. Denies SI and HI, no complaints of pain or discomfort noted.  A Writer offered support and encouragement.  R Pt. Remains safe on the unit, Pt. Is very bright this pm denies any anxiety or depression.  Interpretor was present.  Pt. States he was experiencing withdrawals earlier, presently denies any anxiety or depression.  Pt. Reports he had a great day.

## 2014-08-04 NOTE — Progress Notes (Signed)
[facility]  08/04/2014 3:25 PM Tyler Horn  MRN:  [id number] Subjective: Patient states, "I feel much better today". Objective: ASL interpretor was made use of since patient is deaf. 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 Patient on presentation also reported tactile hallucinations of bugs crawling on him as well as AH of noises. Patient reported abusing his klonopin that was prescribed to him ,finally he ran out of his medication was withdrawn ,laid in bed for days without taking his meals resulting in severe decompensation.  Patient with multiple psychosocial stressors like medical problems ,high work load ,HIV diagnosis as well as past hx of trauma.  Pt denies depression/anxiety today. Pt feels meds are working well, in particular neurontin is helpful for anxiety. Pt is reading a Jonette PesaJohn Horn book. Pt is participating in groups, and sits in day room with staff/peers.  Pt denies SI/HI/AH/VH.    Diagnosis:   DSM5: Primary Psychiatric Diagnosis: MDD,recurrent ,severe without psychosis   Secondary Psychiatric Diagnosis: Generalized anxiety disorder Benzodiazepine use disorder,moderate Benzodiazepine withdrawal with perceptual disturbances.   Non Psychiatric Diagnosis: HIV Positive UTI    Total Time spent with patient: 15 minutes   ADL's:  Intact  Sleep: Good  Appetite:  Good   Psychiatric Specialty Exam: Physical Exam  ROS  Blood pressure 129/70, pulse 91, temperature 97.9 F (36.6 C), temperature source Oral, resp. rate 18, SpO2 98 %.There is no height or weight on file to calculate BMI.  General Appearance: Fairly Groomed  Patent attorneyye Contact::  Fair  Speech:  Clear and Coherent  Volume:  Normal  Mood:  Euthymic   Affect:  Appropriate, Congruent and Full Range  Thought Process:  Goal Directed  Orientation:  Full (Time, Place, and Person)  Thought Content:   Negative   Suicidal Thoughts:  No  Homicidal Thoughts:  No  Memory:  Immediate;   Fair Recent;   Fair Remote;   Fair  Judgement:  Fair  Insight:  Fair  Psychomotor Activity:  Normal  Concentration:  Fair  Recall:  FiservFair  Fund of Knowledge:Fair  Language: Good  Akathisia:  No  Handed:  Right  AIMS (if indicated):     Assets:  Communication Skills Desire for Improvement  Sleep:  Number of Hours: 6.75   Musculoskeletal: Strength & Muscle Tone: within normal limits Gait & Station: normal Patient leans: N/A  Current Medications: Current Facility-Administered Medications  Medication Dose Route Frequency Provider Last Rate Last Dose  . cephALEXin (KEFLEX) capsule 500 mg  500 mg Oral 4 times per day Shuvon Rankin, NP   500 mg at 08/04/14 1211  . emtricitabine-tenofovir (TRUVADA) 200-300 MG per tablet 1 tablet  1 tablet Oral Daily Shuvon Rankin, NP   1 tablet at 08/04/14 0827  . fluvoxaMINE (LUVOX) tablet 50 mg  50 mg Oral Daily Jomarie LongsSaramma Eappen, MD   50 mg at 08/04/14 0827  . gabapentin (NEURONTIN) capsule 300 mg  300 mg Oral TID Jomarie LongsSaramma Eappen, MD   300 mg at 08/04/14 1212  . magnesium hydroxide (MILK OF MAGNESIA) suspension 30 mL  30 mL Oral Daily PRN Shuvon Rankin, NP      . mirtazapine (REMERON) tablet 30 mg  30 mg Oral QHS Jomarie LongsSaramma Eappen, MD   30 mg at 08/03/14 2116  . multivitamin with minerals tablet 1 tablet  1 tablet Oral Daily Jomarie LongsSaramma Eappen, MD   1 tablet at  08/04/14 0827  . nevirapine (VIRAMUNE) tablet 200 mg  200 mg Oral Daily Shuvon Rankin, NP   200 mg at 08/04/14 0827  . [START ON 08/12/2014] Nevirapine TB24 1 tablet  1 tablet Oral Daily Shuvon Rankin, NP      . QUEtiapine (SEROQUEL) tablet 200 mg  200 mg Oral QHS Jomarie LongsSaramma Eappen, MD   200 mg at 08/03/14 2116  . thiamine (B-1) injection 100 mg  100 mg Intramuscular Once Jomarie LongsSaramma Eappen, MD   100 mg at 07/31/14 1700  . thiamine (VITAMIN B-1) tablet 100 mg  100 mg Oral Daily Jomarie LongsSaramma Eappen, MD   100 mg at 08/04/14 0827     Lab Results: No results found for this or any previous visit (from the past 48 hour(s)).  Physical Findings: AIMS: Facial and Oral Movements Muscles of Facial Expression: None, normal Lips and Perioral Area: None, normal Jaw: None, normal Tongue: None, normal,Extremity Movements Upper (arms, wrists, hands, fingers): None, normal Lower (legs, knees, ankles, toes): None, normal, Trunk Movements Neck, shoulders, hips: None, normal, Overall Severity Severity of abnormal movements (highest score from questions above): None, normal Incapacitation due to abnormal movements: None, normal Patient's awareness of abnormal movements (rate only patient's report): No Awareness, Dental Status Current problems with teeth and/or dentures?: No Does patient usually wear dentures?: No  CIWA:  CIWA-Ar Total: 0 COWS:     Treatment Plan Summary: Daily contact with patient to assess and evaluate symptoms and progress in treatment Medication management  Plan:   Cont Luvox  50 mg po daily . Cont Gabapentin 300 mg po tid for anxiety sx. Will continue Seroquel  200 mg po qhs for sleep and Hallucinations. Will continue Remeron  30 mg po qhs .Could titrate up higher. Patient came in with Lexapro as well as Remeron. Lexapro was tapered off and Remeron was titrated higher and Luvox added.  Reviewed past medical records,treatment plan.  Will continue to monitor vitals ,medication compliance and treatment side effects while patient is here.  Will monitor for medical issues as well as call consult as needed. Will continue HIV medications as scheduled. Will continue Keflex for UTI as scheduled.  Reviewed labs ,will order as needed.  CSW will start working on disposition.  Patient to participate in therapeutic milieu .     Medical Decision Making Problem Points:  Established problem, stable/improving (1), Review of last therapy session (1) and Review of psycho-social stressors (1) Data Points:   Review or order clinical lab tests (1) Review of medication regiment & side effects (2) Review of new medications or change in dosage (2)  I certify that inpatient services furnished can reasonably be expected to improve the patient's condition.   Ancil LinseySARANGA, Jerrilynn Mikowski MD 08/04/2014, 3:25 PM

## 2014-08-04 NOTE — Progress Notes (Signed)
Adult Psychoeducational Group Note  Date:  08/04/2014 Time:  10:16 PM  Group Topic/Focus:  Wrap-Up Group:   The focus of this group is to help patients review their daily goal of treatment and discuss progress on daily workbooks.  Participation Level:  Active  Participation Quality:  Appropriate  Affect:  Appropriate  Cognitive:  Appropriate  Insight: Appropriate  Engagement in Group:  Engaged  Modes of Intervention:  Discussion  Additional Comments: The patient expressed that the group was about positive issues.  Octavio Mannshigpen, Lumir Demetriou Lee 08/04/2014, 10:16 PM

## 2014-08-04 NOTE — [facility] Group Notes (Signed)
[facility] Group Notes:  (Nursing/MHT/Case Management/Adjunct)  Date:  08/04/2014  Time:  9:45am  Type of Therapy:  Nurse Education - Healthy Support Systems  Participation Level:  Active  Participation Quality:  Appropriate  Affect:  Appropriate  Cognitive:  Appropriate  Insight:  Good  Engagement in Group:  Engaged  Modes of Intervention:  Discussion, Education and Support  Summary of Progress/Problems: Patient followed along and was engaged in discussion regarding healthy support systems and how that applies to his own life.  Lawrence MarseillesFriedman, Lyza Houseworth Eakes 08/04/2014, 3:17 PM

## 2014-08-04 NOTE — Progress Notes (Signed)
D Pt. Denies SI and HI, no complaints of pain but did experience some diarrhea at HS.  A Writer offered support and encouragement.  Called MD to get order for immodium.  R PT. Remains safe on the unit,  Pt. Reports 0 anxiety and or depression at this time.  Pt. States "I have had a great day, everything is good, I feel great".  Pt. Is presently resting quietly.

## 2014-08-05 DIAGNOSIS — F1390 Sedative, hypnotic, or anxiolytic use, unspecified, uncomplicated: Secondary | ICD-10-CM

## 2014-08-05 DIAGNOSIS — F13239 Sedative, hypnotic or anxiolytic dependence with withdrawal, unspecified: Secondary | ICD-10-CM

## 2014-08-05 MED ORDER — FLUVOXAMINE MALEATE 50 MG PO TABS
75.0000 mg | ORAL_TABLET | Freq: Every day | ORAL | Status: DC
  Administered 2014-08-06: 75 mg via ORAL
  Filled 2014-08-05 (×2): qty 2
  Filled 2014-08-05: qty 6

## 2014-08-05 NOTE — Plan of Care (Signed)
Problem: Ineffective individual coping Goal: STG-Increase in ability to manage activities of daily living Outcome: Completed/Met Date Met:  08/05/14 Patient appears to be taking care of his ADL's appropriately.     

## 2014-08-05 NOTE — [facility] Group Notes (Signed)
[facility]   08/05/2014 3:27 PM  Participation Quality:  Engaged  Mood/Affect:  Appropriate  Depression Rating:  denies  Anxiety Rating:  denies  Thoughts of Suicide:  No Will you contract for safety?   NA  Current AVH:  No  Plan for Discharge/Comments:  Viviann SpareSteven says he is doing greeaaaaaaat like Alinda Moneyony the [employer].  Then states he has been experiencing some degree of nausea, and the plan is to d/c tomorrow.  Confirms that he will see Dr Sandria ManlyLove and pursue a therapist on his own.  I offered him IOP, but he declined, citing work as the reason.  Said he could do a PM program only.  York SpanielSaid he would catch a cab home.  Has no one to pick him up.  Transportation Means: see above  Supports: friends, family  Kiribatiorth, Baldo DaubRodney B

## 2014-08-05 NOTE — Progress Notes (Signed)
[facility] :  Will you be returning to the same living situation after discharge: Yes,  home At discharge, do you have transportation home?:Yes,  friend or cab Do you have the ability to pay for your medications:Yes,  insurance  Release of information consent forms completed and in the chart;  Patient's signature needed at discharge.  Patient to Follow up at: Follow-up Information    [facility]  On 08/12/2014.   Why:  Medication management appointment on Monday Dec. 14th at 9:45 am with Dr Joen LauraLove   Contact information:   [address]  [place], [place] [address] Phone: [phone] Fax: [phone]      Patient denies SI/HI:   Yes,  yes    Safety Planning and Suicide Prevention discussed:  Yes,  yes  [name] Rogueorth, Madilyn Cephas B 08/05/2014, 3:46 PM

## 2014-08-05 NOTE — Progress Notes (Signed)
Patient ID: Tyler Horn, male   DOB: 1960-09-19, 53 y.o.   MRN: [id number]  DAR: Pt. Denies SI/HI and A/V Hallucinations to this writer with help from interpreter [place] Island[place]. Patient filled out daily inventory sheet and reported his depression, anxiety, and hopelessness is 0/10 for the day. Patient reports he slept well last night, his appetite is good, energy is normal, and ability to concentrate is good. Patient does not report any pain or discomfort at this time. Support and encouragement provided to the patient. Patient is receptive and cooperative. Patient is pleasant to staff and fellow peers. Patient is seen signing in the day room with interpreter. Patient is attending groups at this time and following his treatment plan. Q15 minute checks are maintained for safety.
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
## 2014-08-05 NOTE — Tx Team (Signed)
  Interdisciplinary Treatment Plan Update   Date Reviewed:  08/05/2014  Time Reviewed:  3:42 PM  Progress in Treatment:   Attending groups: Yes Participating in groups: Yes Taking medication as prescribed: Yes  Tolerating medication: Yes Family/Significant other contact made: Yes  Patient understands diagnosis: Yes  Discussing patient identified problems/goals with staff: Yes  See initial care plan Medical problems stabilized or resolved: Yes Denies suicidal/homicidal ideation: Yes  In tx team Patient has not harmed self or others: Yes  For review of initial/current patient goals, please see plan of care.  Estimated Length of Stay:  Likely d/c tomorrow  Reason for Continuation of Hospitalization:   New Problems/Goals identified:  N/A  Discharge Plan or Barriers:   return home, follow up outpt  Additional Comments:  Attendees:  Signature: Ivin BootySarama Eappen, MD 08/05/2014 3:42 PM   Signature: Richelle Itood Jaziah Goeller, LCSW 08/05/2014 3:42 PM  Signature:  08/05/2014 3:42 PM  Signature: Marzetta Boardhrista Dopson, RN 08/05/2014 3:42 PM  Signature:  08/05/2014 3:42 PM  Signature:  08/05/2014 3:42 PM  Signature:   08/05/2014 3:42 PM  Signature:    Signature:    Signature:    Signature:    Signature:    Signature:      Scribe for Treatment Team:   Richelle Itood Teva Bronkema, LCSW  08/05/2014 3:42 PM

## 2014-08-05 NOTE — Plan of Care (Signed)
Problem: Diagnosis: Increased Risk For Suicide Attempt Goal: STG-Patient Will Comply With Medication Regime Outcome: Completed/Met Date Met:  08/05/14 Taking meds as prescribed with no reports of adverse effects.

## 2014-08-05 NOTE — Progress Notes (Signed)
The focus of this group is to help patients review their daily goal of treatment and discuss progress on daily workbooks. Pt attended the evening group session and responded to all discussion prompts from the Writer with the assistance of his interpreter. Pt mentioned that he was to discharge home tomorrow and that he felt good about leaving. 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 Pt's only additional request from Nursing Staff this evening was that he be allowed to shave and this was taken care of following group. Pt's affect was appropriate.

## 2014-08-05 NOTE — [facility] Group Notes (Signed)
[facility] LCSW Group Therapy  08/05/2014 1:56 PM  Type of Therapy:  Group Therapy  Participation Level:  Active  Participation Quality:  Attentive  Affect:  Appropriate  Cognitive:  Alert and Oriented  Insight:  Engaged  Engagement in Therapy:  Engaged  Modes of Intervention:  Confrontation, Discussion, Education, Exploration, Problem-solving, Rapport Building, Socialization and Support  Summary of Progress/Problems:  Today's Topic: Community. Group members were asked to identify what "community" means to them, what values a community shares, and examples of where they felt a sense of community. Group members were challenged to process how a community functions to support its members and identify how the positive and negative aspects of a community impact the individual. Viviann SpareSteven was attentive and engaged during today's processing group. He shared that for him, community can be found in support groups. Viviann SpareSteven shared that communication and empathy are two important concepts for community members to understand and practice. He continues to demonstrate insight and provide emotional support for others in the group setting. Viviann SpareSteven also talked about how community members must be able to accept good advice and realize their control over making good vs bad life choices.   Smart, Danniel Tones LCSWA 08/05/2014, 1:56 PM

## 2014-08-06 DIAGNOSIS — F322 Major depressive disorder, single episode, severe without psychotic features: Secondary | ICD-10-CM | POA: Insufficient documentation

## 2014-08-06 DIAGNOSIS — F13939 Sedative, hypnotic or anxiolytic use, unspecified with withdrawal, unspecified: Secondary | ICD-10-CM

## 2014-08-06 MED ORDER — MIRTAZAPINE 30 MG PO TABS: 30.0000 mg | ORAL_TABLET | Freq: Every day | ORAL | Status: DC

## 2014-08-06 MED ORDER — FLUVOXAMINE MALEATE 25 MG PO TABS: 75.0000 mg | ORAL_TABLET | Freq: Every day | ORAL | Status: DC

## 2014-08-06 MED ORDER — GABAPENTIN 300 MG PO CAPS: 300.0000 mg | ORAL_CAPSULE | Freq: Three times a day (TID) | ORAL | Status: DC

## 2014-08-06 MED ORDER — NEVIRAPINE ER 400 MG PO TB24: 1.0000 | ORAL_TABLET | Freq: Every day | ORAL | Status: DC

## 2014-08-06 MED ORDER — QUETIAPINE FUMARATE 200 MG PO TABS: 200.0000 mg | ORAL_TABLET | Freq: Every day | ORAL | Status: DC

## 2014-08-06 MED ORDER — EMTRICITABINE-TENOFOVIR DF 200-300 MG PO TABS: 1.0000 | ORAL_TABLET | Freq: Every day | ORAL | Status: DC

## 2014-08-06 NOTE — Plan of Care (Signed)
Problem: Ineffective individual coping Goal: STG: Patient will remain free from self harm Outcome: Completed/Met Date Met:  08/06/14 Pt is free from self harm and denies SI.

## 2014-08-06 NOTE — Progress Notes (Signed)
Patient ID: Tyler Horn, male   DOB: July 29, 1961, 53 y.o.   MRN: [id number] D: Patient alert and cooperative. With help from interpreter pt reports his day was good. Pt reports discharging tomorrow and and follow up with outpatient provider. Pt also reports plan to visit his mother in [place] for the holidays.Pt denies SI/HI/AVH. No acute distressed noted at this time.   A: Medications administered as prescribed. Emotional support given and will continue to monitor pt's progress and safety.  R: Patient is safe and complaint with medications.

## 2014-08-06 NOTE — [facility] Group Notes (Signed)
[facility] Group Notes:  (Nursing/MHT/Case Management/Adjunct)  Date:  08/06/2014  Time:  12:32 PM  Type of Therapy:  Nurse Education  Participation Level:  Active  Participation Quality:  Appropriate  Affect:  Appropriate  Cognitive:  Alert  Insight:  Good  Engagement in Group:  Engaged  Modes of Intervention:  Discussion and Education  Summary of Progress/Problems: The group topic was recovery. Staff discussed with patient's about what recovery means and coping skills to continue on. Patient attended group with interpreter. Patient is attentive and sharing. Patient was able to answer another patient's question and that was helpful to the other patient.  Marzetta BoardDopson, Irbin Fines E 08/06/2014, 12:32 PM
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
## 2014-08-06 NOTE — Progress Notes (Signed)
Patient ID: Annice PihSteven Minchew, male   DOB: [birth year], [age] y.o.   MRN: [id number]  Pt. 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 Patient had interpreter at side until time of discharge. With help from interpreter patient's AVS and medications were discussed. Belongings returned to patient at time of discharge. Patient denies any pain or discomfort. Discharge instructions and medications were reviewed with patient. Patient verbalized understanding of both medications and discharge instructions. Patient was discharged to a taxi. Q15 minute safety checks were maintained until discharge.

## 2014-08-06 NOTE — Discharge Summary (Signed)
Physician Discharge Summary Note  Patient:  Tyler [place] is an 53 y.o., male MRN:  [id number] DOB:  19-Nov-1960 Patient phone:  [phone] (home)  Patient address:   [address] [place] [address],  Total Time spent with patient: 30 minutes  Date of Admission:  07/30/2014 Date of Discharge: 08/06/14  Reason for Admission:  Depression, Anxiety  Discharge Diagnoses: Principal Problem:   GAD (generalized anxiety disorder) Active Problems:   MDD (major depressive disorder), recurrent, severe, with psychosis   Moderate benzodiazepine use disorder   HIV (human immunodeficiency virus infection)   Major depressive disorder, single episode, severe without psychotic features  Psychiatric Specialty Exam: Physical Exam  Psychiatric: He has a normal mood and affect. His speech is normal and behavior is normal. Judgment and thought content normal. Cognition and memory are normal.    Review of Systems  Constitutional: Negative.   HENT: Negative.   Eyes: Negative.   Respiratory: Negative.   Cardiovascular: Negative.   Gastrointestinal: Negative.   Genitourinary: Negative.   Musculoskeletal: Negative.   Skin: Negative.   Neurological: Negative.   Endo/Heme/Allergies: Negative.   Psychiatric/Behavioral: Positive for depression (Stabilized ). The patient is nervous/anxious (Stabilized ).     Blood pressure 125/67, pulse 120, temperature 97.8 F (36.6 C), temperature source Oral, resp. rate 18, SpO2 98 %.There is no height or weight on file to calculate BMI.  See Physician SRA     Past Psychiatric History: See H&P Diagnosis:  Hospitalizations:  Outpatient Care:  Substance Abuse Care:  Self-Mutilation:  Suicidal Attempts:  Violent Behaviors:   Musculoskeletal: Strength & Muscle Tone: within normal limits Gait & Station: normal Patient leans: N/A  DSM5: Axis Diagnosis:  Primary Psychiatric Diagnosis: MDD,recurrent ,severe without psychosis (improving)  Secondary  Psychiatric Diagnosis: Generalized anxiety disorder(improving) Benzodiazepine use disorder,moderate Benzodiazepine withdrawal with perceptual disturbances.  Non Psychiatric Diagnosis: HIV Positive UTI   Level of Care:  OP  Hospital Course:  Tyler [place] is a 53 year old single caucasian male who has a past hx of depression and GAD presented with SI with plan to stab himself. Patient follows up with Dr.Love at [facility] ,was on Lexapro,seroquel,remeron as well as klonopin. Patient reports various stressors like high work load which made him very anxious. Patient reports abusing his klonopin that was prescribed to him and he finally ran out. Pt reports that he became too depressed to the point that he lay on his bed ,unable to get up and go to [facility] to get his medications.Patient reports tactile hallucinations , feelings on bugs crawling on his skin as well as being tapped on his shoulder. Patient also reports AH of noises ,reports this never happened before. Patient also reports tingling in his ear. Patient currently reports feeling better ,since he has been started back on his medications. Patient has a hx of HIV positive ,on medications, he follows up with ID in [place] and reports that his last viral load was undetectable.          [place] was admitted to the adult unit. He was evaluated and his symptoms were identified. An interpreter named Loraine Leriche was used to communicate with the patient was he was deaf. Medication management was discussed and initiated. His Remeron was increased to 30 mg at bedtime and Seroquel to 200 mg at bedtime. Patient was started on Luvox 5 mg daily to help with symptoms of depression and anxiety. His Klonopin was discontinued due to the history of misuse. The patient was detoxified safely  from Klonopin by completing the librium protocol.  He was oriented to the unit and encouraged to participate in unit programming. Medical problems were identified and treated  appropriately. Home medication was restarted as needed. His medications to treat HIV infection were continued during his hospital stay. The patient was also given a course of Keflex for UTI.         The patient was evaluated each day by a clinical provider to ascertain the patient's response to treatment.  Improvement was noted by the patient's report of decreasing symptoms, improved sleep and appetite, affect, medication tolerance, behavior, and participation in unit programming.  He was asked each day to complete a self inventory noting mood, mental status, pain, new symptoms, anxiety and concerns.         He responded well to medication and being in a therapeutic and supportive environment. Positive and appropriate behavior was noted and the patient was motivated for recovery.  The patient worked closely with the treatment team and case manager to develop a discharge plan with appropriate goals. Coping skills, problem solving as well as relaxation therapies were also part of the unit programming.         By the day of discharge he was in much improved condition than upon admission.  Symptoms were reported as significantly decreased or resolved completely.  The patient denied SI/HI and voiced no AVH. He was motivated to continue taking medication with a goal of continued improvement in mental health.  [place] was discharged home with a plan to follow up as noted below. Patient was provided sample medications and prescriptions at time of discharge. He left [facility] in stable condition with all belongings returned to him.   Consults:  psychiatry  Significant Diagnostic Studies:  Chemistry panel, UDS positive for benzos,   Discharge Vitals:   Blood pressure 125/67, pulse 120, temperature 97.8 F (36.6 C), temperature source Oral, resp. rate 18, SpO2 98 %. There is no height or weight on file to calculate BMI. Lab Results:   No results found for this or any previous visit (from the past 72  hour(s)).  Physical Findings: AIMS: Facial and Oral Movements Muscles of Facial Expression: None, normal Lips and Perioral Area: None, normal Jaw: None, normal Tongue: None, normal,Extremity Movements Upper (arms, wrists, hands, fingers): None, normal Lower (legs, knees, ankles, toes): None, normal, Trunk Movements Neck, shoulders, hips: None, normal, Overall Severity Severity of abnormal movements (highest score from questions above): None, normal Incapacitation due to abnormal movements: None, normal Patient's awareness of abnormal movements (rate only patient's report): No Awareness, Dental Status Current problems with teeth and/or dentures?: No Does patient usually wear dentures?: No  CIWA:  CIWA-Ar Total: 1 COWS:     Psychiatric Specialty Exam: See Psychiatric Specialty Exam and Suicide Risk Assessment completed by Attending Physician prior to discharge.  Discharge destination:  Home  Is patient on multiple antipsychotic therapies at discharge:  No   Has Patient had three or more failed trials of antipsychotic monotherapy by history:  No  Recommended Plan for Multiple Antipsychotic Therapies: NA     Medication List    STOP taking these medications        clonazePAM 0.5 MG tablet  Commonly known as:  KLONOPIN     clonazePAM 1 MG tablet  Commonly known as:  KLONOPIN     diazepam 10 MG tablet  Commonly known as:  VALIUM     diazepam 5 MG tablet  Commonly known as:  VALIUM  escitalopram 20 MG tablet  Commonly known as:  LEXAPRO     valACYclovir 1000 MG tablet  Commonly known as:  VALTREX      TAKE these medications      Indication   emtricitabine-tenofovir 200-300 MG per tablet  Commonly known as:  TRUVADA  Take 1 tablet by mouth daily. For HIV infection   Indication:  HIV Disease     fluvoxaMINE 25 MG tablet  Commonly known as:  LUVOX  Take 3 tablets (75 mg total) by mouth daily. For depression   Indication:  Depression     gabapentin 300 MG  capsule  Commonly known as:  NEURONTIN  Take 1 capsule (300 mg total) by mouth 3 (three) times daily. For agitation/pain managment   Indication:  Agitation, Pain     mirtazapine 30 MG tablet  Commonly known as:  REMERON  Take 1 tablet (30 mg total) by mouth at bedtime. For depression/sleep   Indication:  Trouble Sleeping     Nevirapine 400 MG Tb24  Commonly known as:  VIRAMUNE XR  Take 1 tablet by mouth daily. For HIV infection   Indication:  HIV Disease     QUEtiapine 200 MG tablet  Commonly known as:  SEROQUEL  Take 1 tablet (200 mg total) by mouth at bedtime. For mood control   Indication:  Mood control           Follow-up Information    Follow up with [facility]  On 08/12/2014.   Why:  Medication management appointment on Monday Dec. 14th at 9:45 am with Dr Joen Yarrow Linhart information:   [address]  [place], [place] [address] Phone: [phone] Fax: [phone]      Follow up with Make sure to secure an appointment with a therapist.     Follow-up recommendations:   Activity: no restrictions Diet: regular  Comments:   Take all your medications as prescribed by your mental healthcare provider.  Report any adverse effects and or reactions from your medicines to your outpatient provider promptly.  Patient is instructed and cautioned to not engage in alcohol and or illegal drug use while on prescription medicines.  In the event of worsening symptoms, patient is instructed to call the crisis hotline, 911 and or go to the nearest ED for appropriate evaluation and treatment of symptoms.  Follow-up with your primary care provider for your other medical issues, concerns and or health care needs.   Total Discharge Time:  Greater than 30 minutes.  SignedFransisca Kaufmann NP-C 08/06/2014, 11:30 AM

## 2014-08-09 NOTE — Progress Notes (Signed)
Patient Discharge Instructions:  After Visit Summary (AVS):   Faxed to:  08/09/14 Discharge Summary Note:   Faxed to:  08/09/14 Psychiatric Admission Assessment Note:   Faxed to:  08/09/14 Suicide Risk Assessment - Discharge Assessment:   Faxed to:  08/09/14 Faxed/Sent to the Next Level Care provider:  08/09/14 [facility] @ [phone]  Jerelene ReddenSheena E Luis [place], 08/09/2014, 3:28 PM

## 2017-07-13 ENCOUNTER — Inpatient Hospital Stay (HOSPITAL_COMMUNITY)
Admission: AD | Admit: 2017-07-13 | Discharge: 2017-07-18 | DRG: 885 | Disposition: A | Discharge status: Home / Self Care | Payer: BLUE CROSS/BLUE SHIELD | Source: Intra-hospital | Attending: Psychiatry | Admitting: Psychiatry

## 2017-07-13 ENCOUNTER — Encounter (HOSPITAL_COMMUNITY): Payer: Self-pay | Admitting: *Deleted

## 2017-07-13 ENCOUNTER — Emergency Department (HOSPITAL_COMMUNITY)
Admission: EM | Admit: 2017-07-13 | Discharge: 2017-07-13 | Disposition: A | Discharge status: Other Acute Inpatient Hospital | Payer: BLUE CROSS/BLUE SHIELD | Attending: Physician Assistant | Admitting: Physician Assistant

## 2017-07-13 ENCOUNTER — Other Ambulatory Visit: Payer: Self-pay

## 2017-07-13 DIAGNOSIS — F322 Major depressive disorder, single episode, severe without psychotic features: Secondary | ICD-10-CM | POA: Diagnosis not present

## 2017-07-13 DIAGNOSIS — F332 Major depressive disorder, recurrent severe without psychotic features: Principal | ICD-10-CM | POA: Diagnosis present

## 2017-07-13 DIAGNOSIS — F410 Panic disorder [episodic paroxysmal anxiety] without agoraphobia: Secondary | ICD-10-CM | POA: Diagnosis present

## 2017-07-13 DIAGNOSIS — R45851 Suicidal ideations: Secondary | ICD-10-CM | POA: Insufficient documentation

## 2017-07-13 DIAGNOSIS — B20 Human immunodeficiency virus [HIV] disease: Secondary | ICD-10-CM | POA: Diagnosis not present

## 2017-07-13 DIAGNOSIS — F419 Anxiety disorder, unspecified: Secondary | ICD-10-CM | POA: Insufficient documentation

## 2017-07-13 DIAGNOSIS — Z818 Family history of other mental and behavioral disorders: Secondary | ICD-10-CM

## 2017-07-13 DIAGNOSIS — F411 Generalized anxiety disorder: Secondary | ICD-10-CM | POA: Diagnosis not present

## 2017-07-13 DIAGNOSIS — G4710 Hypersomnia, unspecified: Secondary | ICD-10-CM | POA: Diagnosis present

## 2017-07-13 DIAGNOSIS — F4310 Post-traumatic stress disorder, unspecified: Secondary | ICD-10-CM | POA: Diagnosis not present

## 2017-07-13 DIAGNOSIS — H905 Unspecified sensorineural hearing loss: Secondary | ICD-10-CM | POA: Diagnosis not present

## 2017-07-13 DIAGNOSIS — Z79899 Other long term (current) drug therapy: Secondary | ICD-10-CM

## 2017-07-13 DIAGNOSIS — F329 Major depressive disorder, single episode, unspecified: Secondary | ICD-10-CM | POA: Diagnosis present

## 2017-07-13 DIAGNOSIS — F333 Major depressive disorder, recurrent, severe with psychotic symptoms: Secondary | ICD-10-CM | POA: Diagnosis present

## 2017-07-13 DIAGNOSIS — R440 Auditory hallucinations: Secondary | ICD-10-CM | POA: Diagnosis not present

## 2017-07-13 DIAGNOSIS — Z21 Asymptomatic human immunodeficiency virus [HIV] infection status: Secondary | ICD-10-CM | POA: Diagnosis present

## 2017-07-13 DIAGNOSIS — Z634 Disappearance and death of family member: Secondary | ICD-10-CM | POA: Diagnosis not present

## 2017-07-13 DIAGNOSIS — R197 Diarrhea, unspecified: Secondary | ICD-10-CM | POA: Diagnosis not present

## 2017-07-13 DIAGNOSIS — Z046 Encounter for general psychiatric examination, requested by authority: Secondary | ICD-10-CM | POA: Diagnosis not present

## 2017-07-13 DIAGNOSIS — Z91410 Personal history of adult physical and sexual abuse: Secondary | ICD-10-CM | POA: Diagnosis not present

## 2017-07-13 LAB — COMPREHENSIVE METABOLIC PANEL
ALT: 16 U/L — ABNORMAL LOW (ref 17–63)
AST: 21 U/L (ref 15–41)
Albumin: 4.9 g/dL (ref 3.5–5.0)
Alkaline Phosphatase: 163 U/L — ABNORMAL HIGH (ref 38–126)
Anion gap: 11 (ref 5–15)
BUN: 26 mg/dL — ABNORMAL HIGH (ref 6–20)
CO2: 25 mmol/L (ref 22–32)
Calcium: 9.9 mg/dL (ref 8.9–10.3)
Chloride: 102 mmol/L (ref 101–111)
Creatinine, Ser: 1.46 mg/dL — ABNORMAL HIGH (ref 0.61–1.24)
GFR calc Af Amer: >60 mL/min (ref >60)
GFR calc non Af Amer: 52 mL/min — ABNORMAL LOW (ref >60)
Glucose, Bld: 108 mg/dL — ABNORMAL HIGH (ref 65–99)
Potassium: 3.6 mmol/L (ref 3.5–5.1)
Sodium: 138 mmol/L (ref 135–145)
Total Bilirubin: 1.2 mg/dL (ref 0.3–1.2)
Total Protein: 8.6 g/dL — ABNORMAL HIGH (ref 6.5–8.1)

## 2017-07-13 LAB — CBC
HCT: 47.1 % (ref 39.0–52.0)
Hemoglobin: 17.3 g/dL — ABNORMAL HIGH (ref 13.0–17.0)
MCH: 32.3 pg (ref 26.0–34.0)
MCHC: 36.7 g/dL — ABNORMAL HIGH (ref 30.0–36.0)
MCV: 88 fL (ref 78.0–100.0)
Platelets: 368 10*3/uL (ref 150–400)
RBC: 5.35 MIL/uL (ref 4.22–5.81)
RDW: 14.6 % (ref 11.5–15.5)
WBC: 11.6 10*3/uL — ABNORMAL HIGH (ref 4.0–10.5)

## 2017-07-13 LAB — ACETAMINOPHEN LEVEL: Acetaminophen (Tylenol), Serum: <10 ug/mL — ABNORMAL LOW (ref 10–30)

## 2017-07-13 LAB — ETHANOL: Alcohol, Ethyl (B): <10 mg/dL (ref <10)

## 2017-07-13 LAB — SALICYLATE LEVEL: Salicylate Lvl: <7 mg/dL (ref 2.8–30.0)

## 2017-07-13 MED ORDER — ONDANSETRON HCL 4 MG PO TABS: 4.0000 mg | ORAL_TABLET | Freq: Three times a day (TID) | ORAL | Status: DC | PRN

## 2017-07-13 MED ORDER — NEVIRAPINE 200 MG PO TABS
200.0000 mg | ORAL_TABLET | Freq: Two times a day (BID) | ORAL | Status: DC
  Administered 2017-07-14 – 2017-07-18 (×9): 200 mg via ORAL
  Filled 2017-07-13 (×15): qty 1

## 2017-07-13 MED ORDER — IBUPROFEN 200 MG PO TABS: 600.0000 mg | ORAL_TABLET | Freq: Three times a day (TID) | ORAL | Status: DC | PRN

## 2017-07-13 MED ORDER — QUETIAPINE FUMARATE 100 MG PO TABS
100.0000 mg | ORAL_TABLET | Freq: Every day | ORAL | Status: DC
  Administered 2017-07-13 – 2017-07-17 (×5): 100 mg via ORAL
  Filled 2017-07-13 (×7): qty 1

## 2017-07-13 MED ORDER — EMTRICITABINE-TENOFOVIR AF 200-25 MG PO TABS
1.0000 | ORAL_TABLET | Freq: Every day | ORAL | Status: DC
  Administered 2017-07-14 – 2017-07-18 (×5): 1 via ORAL
  Filled 2017-07-13 (×9): qty 1

## 2017-07-13 MED ORDER — ALUM & MAG HYDROXIDE-SIMETH 200-200-20 MG/5ML PO SUSP
30.0000 mL | ORAL | Status: DC | PRN
  Administered 2017-07-17 – 2017-07-18 (×2): 30 mL via ORAL
  Filled 2017-07-13 (×2): qty 30

## 2017-07-13 MED ORDER — ESCITALOPRAM OXALATE 10 MG PO TABS
10.0000 mg | ORAL_TABLET | Freq: Every day | ORAL | Status: DC
  Administered 2017-07-13: 10 mg via ORAL
  Filled 2017-07-13: qty 1

## 2017-07-13 MED ORDER — HYDROXYZINE HCL 25 MG PO TABS
25.0000 mg | ORAL_TABLET | Freq: Three times a day (TID) | ORAL | Status: DC | PRN
  Administered 2017-07-13: 25 mg via ORAL
  Filled 2017-07-13: qty 1

## 2017-07-13 MED ORDER — IBUPROFEN 600 MG PO TABS: 600.0000 mg | ORAL_TABLET | Freq: Three times a day (TID) | ORAL | Status: DC | PRN

## 2017-07-13 MED ORDER — ESCITALOPRAM OXALATE 10 MG PO TABS
10.0000 mg | ORAL_TABLET | Freq: Every day | ORAL | Status: DC
  Administered 2017-07-14: 10 mg via ORAL
  Filled 2017-07-13 (×3): qty 1

## 2017-07-13 MED ORDER — TRAZODONE HCL 50 MG PO TABS: 50.0000 mg | ORAL_TABLET | Freq: Every evening | ORAL | Status: DC | PRN

## 2017-07-13 MED ORDER — MAGNESIUM HYDROXIDE 400 MG/5ML PO SUSP: 30.0000 mL | Freq: Every day | ORAL | Status: DC | PRN

## 2017-07-13 MED ORDER — QUETIAPINE FUMARATE 100 MG PO TABS: 100.0000 mg | ORAL_TABLET | Freq: Every day | ORAL | Status: DC

## 2017-07-13 MED ORDER — ACETAMINOPHEN 325 MG PO TABS: 650.0000 mg | ORAL_TABLET | Freq: Four times a day (QID) | ORAL | Status: DC | PRN

## 2017-07-13 NOTE — Progress Notes (Signed)
Tyler [place] is a 56 year old male pt admitted on voluntary basis. On admission, Tyler [place] is very fidgety and appears anxious and does endorse on-going depression and anxiety. [place] is deaf but able to communicate by reading lips and able to understand written language to express his needs. [place] did communicate that he lost his service dog and has been feeling increasingly anxious and depressed recently. [place] reports taking medications as prescribed and denies any substance abuse issues. He does endorse passive SI on admission and able to contract for safety while in the hospital. [place] reports that he lives alone and will go back there after discharge. [place] was oriented to the unit and safety maintained.

## 2017-07-13 NOTE — ED Notes (Signed)
TTS at bedside. 

## 2017-07-13 NOTE — ED Notes (Signed)
Mackuen, MD at bedside to assess pt.

## 2017-07-13 NOTE — ED Triage Notes (Signed)
Per EMS, [facility] EMS performed EKG, which showed some PVCs. Pt denies nausea. Pt is deaf.   BP 112/68 HR 110 RR 22  O2 98%

## 2017-07-13 NOTE — ED Provider Notes (Signed)
[facility]-EMERGENCY DEPT Provider Note   CSN: [id number] Arrival date & time: 07/13/17  [id number]     History   Chief Complaint Chief Complaint  Patient presents with  . Panic Attack    HPI [place] is a 56 y.o. male.  HPI   Patient is a [age] year old male presenting with depression and suicidal ideation.  Patient has history of delusional disorder, depression, anxiety, congenital deafness.  [facility] because he was feeling urges to hurt himself.  He reports that he felt voices that were urging him to hurt himself.  [facility].  Past Medical History:  Diagnosis Date  . Anxiety   . Depression   . HIV (human immunodeficiency virus infection) (HCC)   . HIV (human immunodeficiency virus infection) [facility](HCC)     Patient Active Problem List   Diagnosis Date Noted  . Major depressive disorder, single episode, severe without psychotic features (HCC)   . MDD (major depressive disorder), recurrent, severe, with psychosis (HCC) 07/31/2014  . Moderate benzodiazepine use disorder (HCC) 07/31/2014  . GAD (generalized anxiety disorder) 07/31/2014  . HIV (human immunodeficiency virus infection) (HCC)   . MDD (major depressive disorder), severe (HCC) 07/30/2014  . Delusional disorder (HCC) 07/30/2014  . Suicidal ideation 07/30/2014  . Hallucinations   . Depression, major, recurrent (HCC) 10/03/2013  . Anxiety, generalized 03/26/2013    History reviewed. No pertinent surgical history.     Home Medications    Prior to Admission medications   Medication Sig Start Date End Date Taking? Authorizing Provider  emtricitabine-tenofovir (TRUVADA) 200-300 MG per tablet Take 1 tablet by mouth daily. For HIV infection 08/06/14   Armandina StammerNwoko, Agnes I, NP  fluvoxaMINE (LUVOX) 25 MG tablet Take 3 tablets (75 mg total) by mouth daily. For depression 08/06/14   Armandina StammerNwoko, Agnes I, NP  gabapentin (NEURONTIN) 300 MG capsule Take 1  capsule (300 mg total) by mouth 3 (three) times daily. For agitation/pain managment 08/06/14   Armandina StammerNwoko, Agnes I, NP  mirtazapine (REMERON) 30 MG tablet Take 1 tablet (30 mg total) by mouth at bedtime. For depression/sleep 08/06/14   Armandina StammerNwoko, Agnes I, NP  Nevirapine (VIRAMUNE XR) 400 MG TB24 Take 1 tablet by mouth daily. For HIV infection 08/06/14   Armandina StammerNwoko, Agnes I, NP  QUEtiapine (SEROQUEL) 200 MG tablet Take 1 tablet (200 mg total) by mouth at bedtime. For mood control 08/06/14   Sanjuana KavaNwoko, Agnes I, NP    Family History Family History  Problem Relation Age of Onset  . Mental illness Mother   . Bipolar disorder Brother     Social History Social History   Tobacco Use  . Smoking status: Never Smoker  . Smokeless tobacco: Never Used  Substance Use Topics  . Alcohol use: Not on file  . Drug use: Not on file     Allergies   Patient has no known allergies.   Review of Systems Review of Systems  Constitutional: Negative for activity change.  Respiratory: Negative for shortness of breath.   Cardiovascular: Negative for chest pain.  Gastrointestinal: Negative for abdominal pain.  Psychiatric/Behavioral: Positive for suicidal ideas. The patient is nervous/anxious.      Physical Exam Updated Vital Signs BP (!) 140/94 (BP Location: Left Arm)   Pulse (!) 108   Temp 97.8 F (36.6 C) (Oral)   Resp 18   SpO2 94%   Physical Exam  Constitutional: He is oriented to person, place, and time. He  appears well-nourished.  HENT:  Head: Normocephalic.  Eyes: Conjunctivae are normal.  Cardiovascular: Normal rate and regular rhythm.  No murmur heard. Pulmonary/Chest: Breath sounds normal. No respiratory distress.  Neurological: He is oriented to person, place, and time.  Skin: Skin is warm and dry. He is not diaphoretic.  Psychiatric:  Anxious, +SI     ED Treatments / Results  Labs (all labs ordered are listed, but only abnormal results are displayed) Labs Reviewed  COMPREHENSIVE METABOLIC  PANEL  ETHANOL  SALICYLATE LEVEL  ACETAMINOPHEN LEVEL  CBC    EKG  EKG Interpretation None       Radiology No results found.  Procedures Procedures (including critical care time)  Medications Ordered in ED Medications - No data to display   Initial Impression / Assessment and Plan / ED Course  I have reviewed the triage vital signs and the nursing notes.  Pertinent labs & imaging results that were available during my care of the patient were reviewed by me and considered in my medical decision making (see chart for details).    Patient is a 56 year old male presenting with depression and suicidal ideation.  Patient has history of delusional disorder, depression, anxiety, congenital deafness.  [facility] because he was feeling urges to hurt himself.  He reports that he felt voices that were urging him to hurt himself.  [facility].  8:56 AM Active SI with voices.  We will consult TTS.  Final Clinical Impressions(s) / ED Diagnoses   Final diagnoses:  None    ED Discharge Orders    None       Abelino DerrickMackuen, Courteney Lyn, MD 07/13/17 1559

## 2017-07-13 NOTE — ED Notes (Signed)
[facility] en route [facility].

## 2017-07-13 NOTE — BH Assessment (Signed)
[facility] Assessment Progress Note  Per Thedore MinsMojeed Akintayo, MD, this pt requires psychiatric hospitalization at this time.  Malva LimesLinsey Strader, RN, [facility] Rm 406[facility]; they will be ready to receive pt at 13:00.  Pt has signed Voluntary Admission and Consent for Treatment, as well as Consent to Release Information to his mother, to his employer, and to Dr Sandria ManlyLove, his outpatient provider, and a notification call has been placed to the latter.  Signed forms have been faxed to [facility].  Pt's nurse has been notified, and agrees to send original paperwork along with pt via [facility], and to call report to [phone].  Doylene Canninghomas Letzy Gullickson, MA Triage Specialist [phone]
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
## 2017-07-13 NOTE — Progress Notes (Signed)
CSW requested sign language interpreter for the pt. CSW received a response back stating that the interpreting team is working on locating an interpreter for the pt.  Jonathon JordanLynn B Verlie Liotta, MSW, Theresia MajorsLCSWA [phone]

## 2017-07-13 NOTE — Tx Team (Signed)
Initial Treatment Plan 07/13/2017 2:41 PM Tyler PihSteven Brian ZOX:[id number]    PATIENT STRESSORS: Health problems Loss of service dog in June   PATIENT STRENGTHS: Ability for insight Active sense of humor Average or above average intelligence Capable of independent living General fund of knowledge Motivation for treatment/growth   PATIENT IDENTIFIED PROBLEMS: Depression Suicidal thoughts "feeling anxious"                     DISCHARGE CRITERIA:  Ability to meet basic life and health needs Improved stabilization in mood, thinking, and/or behavior Verbal commitment to aftercare and medication compliance  PRELIMINARY DISCHARGE PLAN: Attend aftercare/continuing care group Return to previous living arrangement  PATIENT/FAMILY INVOLVEMENT: This treatment plan has been presented to and reviewed with the patient, Tyler Horn, and/or family member, .  The patient and family have been given the opportunity to ask questions and make suggestions.  Karisa Nesser, [place], [place] 07/13/2017, 2:41 PM

## 2017-07-13 NOTE — Progress Notes (Signed)
Patient ID: Annice PihSteven Gedeon, male   DOB: 01-17-1961, 56 y.o.   MRN: [id number]  Pt currently presents with an animated affect and cooperative, guarded behavior. Pt reports to writer that he is "just tired." Pt requests to take sleep medications early. Pt endorses an on going feeling of "being touched" by someone "all over his body." Pt reports good sleep with current medication regimen, will notify RN if he feels he needs as needed sleep medication ordered. Pt instructed to use cane during ambulation due to patients report of feeling "unsteady."  Pt provided with medications per providers orders. Pt's labs and vitals were monitored throughout the night. Pt given a 1:1 about emotional and mental status. Pt supported and encouraged to express concerns and questions. Pt educated on medications and fall risk prevention. Assessment completed with the assistance of interpreter services.   Pt's safety ensured with 15 minute and environmental checks. Pt currently denies SI/HI and A/V hallucinations. Pt verbally agrees to seek staff if SI/HI or A/VH occurs and to consult with staff before acting on any harmful thoughts. Will continue POC.

## 2017-07-14 DIAGNOSIS — Z818 Family history of other mental and behavioral disorders: Secondary | ICD-10-CM

## 2017-07-14 DIAGNOSIS — F410 Panic disorder [episodic paroxysmal anxiety] without agoraphobia: Secondary | ICD-10-CM

## 2017-07-14 DIAGNOSIS — F419 Anxiety disorder, unspecified: Secondary | ICD-10-CM

## 2017-07-14 DIAGNOSIS — F4310 Post-traumatic stress disorder, unspecified: Secondary | ICD-10-CM

## 2017-07-14 DIAGNOSIS — Z634 Disappearance and death of family member: Secondary | ICD-10-CM

## 2017-07-14 DIAGNOSIS — Z91410 Personal history of adult physical and sexual abuse: Secondary | ICD-10-CM

## 2017-07-14 DIAGNOSIS — F332 Major depressive disorder, recurrent severe without psychotic features: Principal | ICD-10-CM

## 2017-07-14 MED ORDER — CLONAZEPAM 0.5 MG PO TABS
0.5000 mg | ORAL_TABLET | Freq: Two times a day (BID) | ORAL | Status: DC | PRN
  Administered 2017-07-15 – 2017-07-17 (×4): 0.5 mg via ORAL
  Filled 2017-07-14 (×4): qty 1

## 2017-07-14 MED ORDER — ESCITALOPRAM OXALATE 5 MG PO TABS
15.0000 mg | ORAL_TABLET | Freq: Every day | ORAL | Status: DC
  Administered 2017-07-15: 15 mg via ORAL
  Filled 2017-07-14 (×3): qty 3

## 2017-07-14 NOTE — Progress Notes (Signed)
D: Patient denies SI, HI or AVH at this time.  Pt. Reports that he slept well and appetite has been good.  He does report feeling anxious but medication has been held due to hypotension and dizziness.  Pt. Has been given PO fluids and encouraged to increase his intake.  Pt. Understands and progressing well.  Pt. Is visualized in the dayroom interacting with others.  Pt. Is able to read lips and communicate with writing but also has an interpreter expected ~1300 today.  Pt.'s goal is to "focus on my illness to improve myself".  A: Patient given emotional support from RN. Patient encouraged to come to staff with concerns and/or questions. Patient's medication routine continued. Patient's orders and plan of care reviewed.   R: Patient remains appropriate and cooperative. Will continue to monitor patient q15 minutes for safety.

## 2017-07-14 NOTE — Progress Notes (Signed)
Interpreter has been present on the unit for afternoon group as well as interaction with the MD.  She will be returning ~2000 tonight for wrap up group.  Discussed any current needs or questions with the patient prior to her departure.

## 2017-07-14 NOTE — Tx Team (Signed)
Interdisciplinary Treatment and Diagnostic Plan Update  07/14/2017 Time of Session: 3:17 PM  Tyler Horn MRN: [id number]  Principal Diagnosis: MDD  Secondary Diagnoses: Active Problems:   MDD (major depressive disorder), recurrent severe, without psychosis ([place])   Current Medications:  Current Facility-Administered Medications  Medication Dose Route Frequency Provider Last Rate Last Dose  . acetaminophen (TYLENOL) tablet 650 mg  650 mg Oral Q6H PRN Ethelene Hal, NP      . alum & mag hydroxide-simeth (MAALOX/MYLANTA) 200-200-20 MG/5ML suspension 30 mL  30 mL Oral Q4H PRN Ethelene Hal, NP      . clonazePAM Bobbye [place]) tablet 0.5 mg  0.5 mg Oral BID PRN Cobos, Myer Peer, MD      . emtricitabine-tenofovir AF (DESCOVY) 200-25 MG per tablet 1 tablet  1 tablet Oral Daily Cobos, Myer Peer, MD   1 tablet at 07/14/17 [phone]  . [START ON 07/15/2017] escitalopram (LEXAPRO) tablet 15 mg  15 mg Oral Daily Cobos, Fernando A, MD      . hydrOXYzine (ATARAX/VISTARIL) tablet 25 mg  25 mg Oral TID PRN Ethelene Hal, NP   25 mg at 07/13/17 1454  . ibuprofen (ADVIL,MOTRIN) tablet 600 mg  600 mg Oral Q8H PRN Ethelene Hal, NP      . magnesium hydroxide (MILK OF MAGNESIA) suspension 30 mL  30 mL Oral Daily PRN Ethelene Hal, NP      . nevirapine [facility]) tablet 200 mg  200 mg Oral Q12H Cobos, Myer Peer, MD   200 mg at 07/14/17 6294  . ondansetron (ZOFRAN) tablet 4 mg  4 mg Oral Q8H PRN Ethelene Hal, NP      . QUEtiapine (SEROQUEL) tablet 100 mg  100 mg Oral QHS Ethelene Hal, NP   100 mg at 07/13/17 2004  . traZODone (DESYREL) tablet 50 mg  50 mg Oral QHS PRN Ethelene Hal, NP        PTA Medications: Medications Prior to Admission  Medication Sig Dispense Refill Last Dose  . clonazePAM (KLONOPIN) 0.5 MG tablet Take 0.5 mg 2 (two) times daily by mouth.    07/12/2017 at Unknown time  . emtricitabine-tenofovir (TRUVADA) 200-300 MG per  tablet Take 1 tablet by mouth daily. For HIV infection   07/12/2017 at Unknown time  . escitalopram (LEXAPRO) 10 MG tablet Take 10 mg daily by mouth.  1 07/12/2017 at Unknown time  . fluvoxaMINE (LUVOX) 25 MG tablet Take 3 tablets (75 mg total) by mouth daily. For depression (Patient not taking: Reported on 07/13/2017) 30 tablet 0 Not Taking at Unknown time  . gabapentin (NEURONTIN) 300 MG capsule Take 1 capsule (300 mg total) by mouth 3 (three) times daily. For agitation/pain managment (Patient not taking: Reported on 07/13/2017) 90 capsule 0 Not Taking at Unknown time  . mirtazapine (REMERON) 30 MG tablet Take 1 tablet (30 mg total) by mouth at bedtime. For depression/sleep (Patient not taking: Reported on 07/13/2017) 30 tablet 0 Not Taking at Unknown time  . Nevirapine (VIRAMUNE XR) 400 MG TB24 Take 1 tablet by mouth daily. For HIV infection 30 tablet  07/12/2017 at Unknown time  . QUEtiapine (SEROQUEL) 100 MG tablet Take 100 mg at bedtime by mouth.   07/12/2017 at Unknown time  . QUEtiapine (SEROQUEL) 200 MG tablet Take 1 tablet (200 mg total) by mouth at bedtime. For mood control (Patient not taking: Reported on 07/13/2017) 30 tablet 0 Not Taking at Unknown time    Patient Stressors: Health problems Loss  of service dog in June  Patient Strengths: Ability for insight Active sense of humor Average or above average intelligence Capable of independent living [employer] of knowledge Motivation for treatment/growth  Treatment Modalities: Medication Management, Group therapy, Case management,  1 to 1 session with clinician, Psychoeducation, Recreational therapy.   Physician Treatment Plan for Primary Diagnosis: MDD Long Term Goal(s): Improvement in symptoms so as ready for discharge  Short Term Goals: Ability to identify changes in lifestyle to reduce recurrence of condition will improve Ability to maintain clinical measurements within normal limits will improve Ability to identify  changes in lifestyle to reduce recurrence of condition will improve Ability to identify and develop effective coping behaviors will improve Ability to maintain clinical measurements within normal limits will improve  Medication Management: Evaluate patient's response, side effects, and tolerance of medication regimen.  Therapeutic Interventions: 1 to 1 sessions, Unit Group sessions and Medication administration.  Evaluation of Outcomes: Progressing  Physician Treatment Plan for Secondary Diagnosis: Active Problems:   MDD (major depressive disorder), recurrent severe, without psychosis ([place])   Long Term Goal(s): Improvement in symptoms so as ready for discharge  Short Term Goals: Ability to identify changes in lifestyle to reduce recurrence of condition will improve Ability to maintain clinical measurements within normal limits will improve Ability to identify changes in lifestyle to reduce recurrence of condition will improve Ability to identify and develop effective coping behaviors will improve Ability to maintain clinical measurements within normal limits will improve  Medication Management: Evaluate patient's response, side effects, and tolerance of medication regimen.  Therapeutic Interventions: 1 to 1 sessions, Unit Group sessions and Medication administration.  Evaluation of Outcomes: Progressing   RN Treatment Plan for Primary Diagnosis: MDD Long Term Goal(s): Knowledge of disease and therapeutic regimen to maintain health will improve  Short Term Goals: Ability to identify and develop effective coping behaviors will improve and Compliance with prescribed medications will improve  Medication Management: RN will administer medications as ordered by provider, will assess and evaluate patient's response and provide education to patient for prescribed medication. RN will report any adverse and/or side effects to prescribing provider.  Therapeutic Interventions: 1 on 1  counseling sessions, Psychoeducation, Medication administration, Evaluate responses to treatment, Monitor vital signs and CBGs as ordered, Perform/monitor CIWA, COWS, AIMS and Fall Risk screenings as ordered, Perform wound care treatments as ordered.  Evaluation of Outcomes: Progressing   LCSW Treatment Plan for Primary Diagnosis: MDD Long Term Goal(s): Safe transition to appropriate next level of care at discharge, Engage patient in therapeutic group addressing interpersonal concerns.  Short Term Goals: Engage patient in aftercare planning with referrals and resources  Therapeutic Interventions: Assess for all discharge needs, 1 to 1 time with Social worker, Explore available resources and support systems, Assess for adequacy in community support network, Educate family and significant other(s) on suicide prevention, Complete Psychosocial Assessment, Interpersonal group therapy.  Evaluation of Outcomes: Met   Progress in Treatment: Attending groups: Yes Participating in groups: Yes Taking medication as prescribed: Yes Toleration medication: Yes, no side effects reported at this time Family/Significant other contact made: No  Pt refused Patient understands diagnosis: Yes AEB asking for help with anxiety and depression Discussing patient identified problems/goals with staff: Yes Medical problems stabilized or resolved: Yes Denies suicidal/homicidal ideation: Yes Issues/concerns per patient self-inventory: None Other: N/A  New problem(s) identified: None identified at this time.   New Short Term/Long Term Goal(s): "I'm was feeling suicidal because of my intense anxiety and racing thoughts."  Discharge Plan or Barriers:   Reason for Continuation of Hospitalization:  Depression  Medication stabilization Suicidal ideation   Estimated Length of Stay: 11/20  Attendees: Patient: Tyler Horn 07/14/2017  3:17 PM  Physician: Neita Garnet, MD 07/14/2017  3:17 PM  Nursing:  Sena Hitch, RN 07/14/2017  3:17 PM  RN Care Manager: Lars Pinks, RN 07/14/2017  3:17 PM  Social Worker: Ripley Fraise 07/14/2017  3:17 PM  Recreational Therapist: Winfield Cunas 07/14/2017  3:17 PM  Other: Norberto Sorenson 07/14/2017  3:17 PM  Other:  07/14/2017  3:17 PM    Scribe for Treatment Team:  Roque Lias LCSW 07/14/2017 3:17 PM
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
## 2017-07-14 NOTE — [facility] Suicide Risk Assessment (Signed)
[facility] INPATIENT:  Family/Significant Other Suicide Prevention Education  Suicide Prevention Education:  Patient Refusal for Family/Significant Other Suicide Prevention Education: The patient Annice PihSteven Leis has refused to provide written consent for family/significant other to be provided Family/Significant Other Suicide Prevention Education during admission and/or prior to discharge.  Physician notified.  Baldo DaubRodney B [facility] 07/14/2017, 5:44 PM
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
## 2017-07-14 NOTE — [facility] Suicide Risk Assessment (Signed)
[facility]   Nursing information obtained from:   patient and chart  Demographic factors:   [age] year old single male, employed, lives alone  Current Mental Status:   see below Loss Factors:   death of therapy dog  Historical Factors:   depression, anxiety Risk Reduction Factors:   resilience, employment  Total Time spent with patient: 45 minutes Principal Problem:  MDD, no psychotic features  Diagnosis:   Patient Active Problem List   Diagnosis Date Noted  . MDD (major depressive disorder), recurrent severe, without psychosis ([facility]) [F33.2] 07/13/2017  . Major depressive disorder, single episode, severe without psychotic features ([facility]) [F32.2]   . MDD (major depressive disorder), recurrent, severe, with psychosis ([facility]) [F33.3] 07/31/2014  . 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 GAD (generalized anxiety disorder) [F41.1] 07/31/2014  . HIV (human immunodeficiency virus infection) (HCC) [B20]   . MDD (major depressive disorder), severe ([facility]) [F32.2] 07/30/2014  . Delusional disorder ([facility]CC) [F22] 07/30/2014  . Suicidal ideation [R45.851] 07/30/2014  . Hallucinations [R44.3]   . Depression, major, recurrent ([facility]) [F33.9] 10/03/2013  . Anxiety, generalized [F41.1] 03/26/2013    Continued Clinical Symptoms:  Alcohol Use Disorder Identification Test Final Score (AUDIT): 0 The "Alcohol Use Disorders Identification Test", Guidelines for Use in Primary Care, Second Edition.  World Science writerHealth Organization [facility](WHO). Score between 0-7:  no or low risk or alcohol related problems. Score between 8-15:  moderate risk of alcohol related problems. Score between 16-19:  high risk of alcohol related problems. Score 20 or above:  warrants further diagnostic evaluation for alcohol dependence and treatment.   CLINICAL FACTORS:  [age] year old male, presented voluntarily to hospital due to worsening depression, anxiety, recent suicidal ideations. Reports  history of depression , worsening after his therapy dog died a few weeks ago   Psychiatric Specialty Exam: Physical Exam  ROS  Blood pressure 122/74, pulse 91, temperature 97.9 F (36.6 C), temperature source Oral, resp. rate 16, height 6\' 2"  (1.88 m), weight 95.3 kg (210 lb).Body mass index is 26.96 kg/m.   see admit note MSE   COGNITIVE FEATURES THAT CONTRIBUTE TO RISK:  Closed-mindedness and Loss of executive function    SUICIDE RISK:   Moderate:  Frequent suicidal ideation with limited intensity, and duration, some specificity in terms of plans, no associated intent, good self-control, limited dysphoria/symptomatology, some risk factors present, and identifiable protective factors, including available and accessible social support.  PLAN OF CARE: Patient will be admitted to inpatient psychiatric unit for stabilization and safety. Will provide and encourage milieu participation. Provide medication management and maked adjustments as needed.  Will follow daily.    I certify that inpatient services furnished can reasonably be expected to improve the patient's condition.   Craige CottaFernando A Leopold Smyers, MD 07/14/2017, 2:56 PM

## 2017-07-15 LAB — LIPID PANEL
Cholesterol: 181 mg/dL (ref 0–200)
HDL: 31 mg/dL — ABNORMAL LOW (ref >40)
LDL Cholesterol: 127 mg/dL — ABNORMAL HIGH (ref 0–99)
Total CHOL/HDL Ratio: 5.8 RATIO
Triglycerides: 113 mg/dL (ref <150)
VLDL: 23 mg/dL (ref 0–40)

## 2017-07-15 LAB — BASIC METABOLIC PANEL
Anion gap: 6 (ref 5–15)
BUN: 23 mg/dL — ABNORMAL HIGH (ref 6–20)
CO2: 31 mmol/L (ref 22–32)
Calcium: 9.2 mg/dL (ref 8.9–10.3)
Chloride: 103 mmol/L (ref 101–111)
Creatinine, Ser: 1.24 mg/dL (ref 0.61–1.24)
GFR calc Af Amer: >60 mL/min (ref >60)
GFR calc non Af Amer: >60 mL/min (ref >60)
Glucose, Bld: 97 mg/dL (ref 65–99)
Potassium: 3.6 mmol/L (ref 3.5–5.1)
Sodium: 140 mmol/L (ref 135–145)

## 2017-07-15 LAB — HEMOGLOBIN A1C
Hgb A1c MFr Bld: 5.1 % (ref 4.8–5.6)
Mean Plasma Glucose: 99.67 mg/dL

## 2017-07-15 MED ORDER — ESCITALOPRAM OXALATE 20 MG PO TABS
20.0000 mg | ORAL_TABLET | Freq: Every day | ORAL | Status: DC
  Administered 2017-07-16 – 2017-07-18 (×3): 20 mg via ORAL
  Filled 2017-07-15 (×5): qty 1

## 2017-07-15 NOTE — Progress Notes (Addendum)
D: Pt was in the dayroom upon initial approach.  Pt presents with anxious affect and appropriate mood.  Pt denies SI/HI, denies hallucinations, denies pain.  Pt has been visible in milieu interacting using sign language interpreter.  Pt attended evening group.  Pt has been ambulating without assistive device tonight.  He denies feeling "dizzy" or "light-headed."    A: Communicated with pt using sign language interpreter.  His goal today was to "be more productive."  He reports he discussed potential aftercare plans with social worker today and plans to do the same tomorrow with provider.  Denies feeling anxious tonight and reports his day was "pretty good."  Medications administered per order.  Pt is knowledgeable regarding medication regimen.  Support and encouragement offered.  Q15 minute safety checks maintained.  R: Pt is safe on the unit.  Pt is compliant with medications.  Pt verbally contracts for safety.  Will continue to monitor and assess.

## 2017-07-15 NOTE — [facility] Group Notes (Signed)
LCSW Group Therapy Note  07/15/2017 1:15pm  Type of Therapy and Topic:  Group Therapy:  Feelings around Relapse and Recovery  Participation Level: Active   Description of Group:    Patients in this group will discuss emotions they experience before and after a relapse. They will process how experiencing these feelings, or avoidance of experiencing them, relates to having a relapse. Facilitator will guide patients to explore emotions they have related to recovery. Patients will be encouraged to process which emotions are more powerful. They will be guided to discuss the emotional reaction significant others in their lives may have to their relapse or recovery. Patients will be assisted in exploring ways to respond to the emotions of others without this contributing to a relapse.  Therapeutic Goals: 1. Patient will identify two or more emotions that lead to a relapse for them 2. Patient will identify two emotions that result when they relapse 3. Patient will identify two emotions related to recovery 4. Patient will demonstrate ability to communicate their needs through discussion and/or role plays    Therapeutic Modalities:   Cognitive Behavioral Therapy Solution-Focused Therapy Assertiveness Training Relapse Prevention Therapy   Kaytlen Lightsey B Aimi Essner, MSW, LCSWA 07/15/2017 3:42 PM   

## 2017-07-15 NOTE — Progress Notes (Addendum)
[facility] MD Progress Note ( patient interviewed with Sign Language interpreter) 07/15/2017 4:12 PM Tyler Horn  MRN:  [id number] Subjective: Reports partial improvement compared to how he felt prior to admission, but continues to feel depressed, anxious . Denies suicidal ideations at this time. Thus far tolerating medications well and denies side effects. Objective : I have discussed case with treatment team and have met with patient. Patient reports some improvement of mood today. Denies suicidal ideations at this time. Affect presents constricted, anxious, but tending to improve partially during session. At this time remains ruminative about the death of his therapy dog, which he identifies as loss/event that triggered his current state of depression. He also worries and ruminates about outpatient treatment , focusing on perceived challenges to outpatient care. 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 Would consider referrals to an outpatient psychiatric clinic , but states coordinating to obtain interpreter services on a routine basis would be more onerous . Denies medication side effects. Visible in day room, no disruptive or agitated behaviors .  Labs reviewed- BUN 23, Creatinine 1.24, GFR >60. ( Improved compared to prior)   Principal Problem:  MDD  Diagnosis:   Patient Active Problem List   Diagnosis Date Noted  . MDD (major depressive disorder), recurrent severe, without psychosis ([place]) [F33.2] 07/13/2017  . Major depressive disorder, single episode, severe without psychotic features ([place]) [F32.2]   . MDD (major depressive disorder), recurrent, severe, with psychosis ([place]) [F33.3] 07/31/2014  . Moderate benzodiazepine use disorder ([place]) [F13.20] 07/31/2014  . GAD (generalized anxiety disorder) [F41.1] 07/31/2014  . HIV (human immunodeficiency virus infection) ([place]) [B20]   . MDD  (major depressive disorder), severe ([place]) [F32.2] 07/30/2014  . Delusional disorder ([place]) [F22] 07/30/2014  . Suicidal ideation [R45.851] 07/30/2014  . Hallucinations [R44.3]   . Depression, major, recurrent ([place]) [F33.9] 10/03/2013  . Anxiety, generalized [F41.1] 03/26/2013   Total Time spent with patient: 20 minutes  Past Medical History:  Past Medical History:  Diagnosis Date  . Anxiety   . Depression   . HIV (human immunodeficiency virus infection) ([place])   . HIV (human immunodeficiency virus infection) ([place])    History reviewed. No pertinent surgical history. Family History:  Family History  Problem Relation Age of Onset  . Mental illness Mother   . Bipolar disorder Brother    Social History:  Social History   Substance and Sexual Activity  Alcohol Use No  . Frequency: Never     Social History   Substance and Sexual Activity  Drug Use No    Social History   Socioeconomic History  . Marital status: Single    Spouse name: None  . Number of children: None  . Years of education: None  . Highest education level: None  Social Needs  . Financial resource strain: None  . Food insecurity - worry: None  . Food insecurity - inability: None  . Transportation needs - medical: None  . Transportation needs - non-medical: None  Occupational History  . None  Tobacco Use  . Smoking status: Never Smoker  . Smokeless tobacco: Never Used  Substance and Sexual Activity  . Alcohol use: No    Frequency: Never  . Drug use: No  . Sexual activity: Yes  Other Topics Concern  . None  Social History Narrative  . None   Additional Social History:   Sleep:  improving   Appetite:  Good  Current Medications: Current Facility-Administered Medications  Medication Dose Route Frequency Provider Last Rate Last Dose  . acetaminophen (TYLENOL) tablet 650 mg  650 mg Oral Q6H PRN Ethelene Hal, NP      . alum & mag hydroxide-simeth (MAALOX/MYLANTA) 200-200-20 MG/5ML  suspension 30 mL  30 mL Oral Q4H PRN Ethelene Hal, NP      . clonazePAM [facility] [place]) tablet 0.5 mg  0.5 mg Oral BID PRN Cobos, Myer Peer, MD   0.5 mg at 07/15/17 1323  . emtricitabine-tenofovir AF (DESCOVY) 200-25 MG per tablet 1 tablet  1 tablet Oral Daily Cobos, Myer Peer, MD   1 tablet at 07/15/17 0840  . escitalopram (LEXAPRO) tablet 15 mg  15 mg Oral Daily Cobos, Myer Peer, MD   15 mg at 07/15/17 0840  . hydrOXYzine (ATARAX/VISTARIL) tablet 25 mg  25 mg Oral TID PRN Ethelene Hal, NP   25 mg at 07/13/17 1454  . ibuprofen (ADVIL,MOTRIN) tablet 600 mg  600 mg Oral Q8H PRN Ethelene Hal, NP      . magnesium hydroxide (MILK OF MAGNESIA) suspension 30 mL  30 mL Oral Daily PRN Ethelene Hal, NP      . nevirapine [facility]) tablet 200 mg  200 mg Oral Q12H Cobos, Myer Peer, MD   200 mg at 07/15/17 0840  . ondansetron (ZOFRAN) tablet 4 mg  4 mg Oral Q8H PRN Ethelene Hal, NP      . QUEtiapine (SEROQUEL) tablet 100 mg  100 mg Oral QHS Ethelene Hal, NP   100 mg at 07/14/17 2100  . traZODone (DESYREL) tablet 50 mg  50 mg Oral QHS PRN Ethelene Hal, NP        Lab Results:  Results for orders placed or performed during the hospital encounter of 07/13/17 (from the past 48 hour(s))  Basic metabolic panel     Status: Abnormal   Collection Time: 07/15/17  6:12 AM  Result Value Ref Range   Sodium 140 135 - 145 mmol/L   Potassium 3.6 3.5 - 5.1 mmol/L   Chloride 103 101 - 111 mmol/L   CO2 31 22 - 32 mmol/L   Glucose, Bld 97 65 - 99 mg/dL   BUN 23 (H) 6 - 20 mg/dL   Creatinine, Ser 1.24 0.61 - 1.24 mg/dL   Calcium 9.2 8.9 - 10.3 mg/dL   GFR calc non Af Amer >60 >60 mL/min   GFR calc Af Amer >60 >60 mL/min    Comment: (NOTE) The eGFR has been calculated using the CKD EPI equation. This calculation has not been validated in all clinical situations. eGFR's persistently <60 mL/min signify possible Chronic Kidney Disease.    Anion gap 6 5 - 15     Comment: Performed at [facility], [place] [address] [place], [place] [address]  Lipid panel     Status: Abnormal   Collection Time: 07/15/17  6:12 AM  Result Value Ref Range   Cholesterol 181 0 - 200 mg/dL   Triglycerides 113 <150 mg/dL   HDL 31 (L) >40 mg/dL   Total CHOL/HDL Ratio 5.8 RATIO   VLDL 23 0 - 40 mg/dL   LDL Cholesterol 127 (H) 0 - 99 mg/dL    Comment:        Total Cholesterol/HDL:CHD Risk Coronary Heart Disease Risk Table  Men   Women  1/2 Average Risk   3.4   3.3  Average Risk       5.0   4.4  2 X Average Risk   9.6   7.1  3 X Average Risk  23.4   11.0        Use the calculated Patient Ratio above and the CHD Risk Table to determine the patient's CHD Risk.        ATP III CLASSIFICATION (LDL):  <100     mg/dL   Optimal  100-129  mg/dL   Near or Above                    Optimal  130-159  mg/dL   Borderline  160-189  mg/dL   High  >190     mg/dL   Very High Performed at [facility] [address], [place], [place] [address]   Hemoglobin A1c     Status: None   Collection Time: 07/15/17  6:12 AM  Result Value Ref Range   Hgb A1c MFr Bld 5.1 4.8 - 5.6 %    Comment: (NOTE) Pre diabetes:          5.7%-6.4% Diabetes:              >6.4% Glycemic control for   <7.0% adults with diabetes    Mean Plasma Glucose 99.67 mg/dL    Comment: Performed at [facility] [address], [place], [place] [address]    Blood Alcohol level:  Lab Results  Component Value Date   [facility] <10 07/13/2017   ETH <11 62/26/3335    Metabolic Disorder Labs: Lab Results  Component Value Date   HGBA1C 5.1 07/15/2017   MPG 99.67 07/15/2017   No results found for: PROLACTIN Lab Results  Component Value Date   CHOL 181 07/15/2017   TRIG 113 07/15/2017   HDL 31 (L) 07/15/2017   CHOLHDL 5.8 07/15/2017   VLDL 23 07/15/2017   LDLCALC 127 (H) 07/15/2017    Physical Findings: AIMS: Facial and Oral Movements Muscles of Facial  Expression: None, normal Lips and Perioral Area: None, normal Jaw: None, normal Tongue: None, normal,Extremity Movements Upper (arms, wrists, hands, fingers): None, normal Lower (legs, knees, ankles, toes): None, normal, Trunk Movements Neck, shoulders, hips: None, normal, Overall Severity Severity of abnormal movements (highest score from questions above): None, normal Incapacitation due to abnormal movements: None, normal Patient's awareness of abnormal movements (rate only patient's report): No Awareness, Dental Status Current problems with teeth and/or dentures?: No Does patient usually wear dentures?: No  CIWA:    COWS:     Musculoskeletal: Strength & Muscle Tone: within normal limits Gait & Station: normal Patient leans: N/A  Psychiatric Specialty Exam: Physical Exam  ROS no headache, no chest pain, no shortness of breath, no vomiting   Blood pressure (!) 92/54, pulse 83, temperature 98.7 F (37.1 C), temperature source Oral, resp. rate 18, height 6' 2"  (1.88 m), weight 95.3 kg (210 lb).Body mass index is 26.96 kg/m.  General Appearance: improved grooming   Eye Contact:  Good  Speech:  Normal Rate- communicates via sign language  Volume:  NA  Mood:  remains depressed, but partially improved today  Affect:  constricted, anxious, but more reactive   Thought Process:  Linear and Descriptions of Associations: Intact  Orientation:  Full (Time, Place, and Person)  Thought Content:  no hallucinations, no delusions , ruminative about losses, stressors  Suicidal  Thoughts:  No denies suicidal or self injurious ideations, contracts for safety on unit   Homicidal Thoughts:  No  Memory:  recent and remote grossly intact   Judgement:  Other:  improving   Insight:  improving   Psychomotor Activity:  Normal  Concentration:  Concentration: Good and Attention Span: Good  Recall:  Good  Fund of Knowledge:  Good  Language:  Good  Akathisia:  Negative  Handed:  Right  AIMS (if  indicated):     Assets:  Desire for Improvement Resilience  ADL's:  Intact  Cognition:  WNL  Sleep:  Number of Hours: 6.5   Assessment - patient presents with partially improved mood but still constricted , anxious, ruminative. Denies suicidal ideations. Tolerating medications well .   Treatment Plan Summary: Daily contact with patient to assess and evaluate symptoms and progress in treatment, Medication management, Plan inpatient treatment  and medications as below  Treatment /milieu participation encouraged to work on coping skills and symptom reduction Increase Lexapro to 20 mgrs QDAY for depression and anxiety Continue Seroquel 100 mgrs QHS for mood disorder, anxiety,insomnia  Continue Klonopin 0.5 mgrs BID PRN for anxiety as needed  Continue Trazodone 50 mgrs QHS PRN for insomnia  Treatment team working on disposition planning options  Jenne Campus, MD 07/15/2017, 4:12 PM

## 2017-07-15 NOTE — Plan of Care (Signed)
  Safety: Periods of time without injury will increase 07/15/2017 2153 by Arrie Aranhurch, Shyra Emile J, RN Note Pt has not harmed self or others tonight.  He denies SI/HI and verbally contracts for safety.  07/15/2017 2153 - Progressing by Arrie Aranhurch, Namiyah Grantham J, RN

## 2017-07-15 NOTE — Progress Notes (Addendum)
Patient ID: Tyler PihSteven Horn, male   DOB: 04/14/1961, 56 y.o.   MRN: [id number]  Pt currently presents with a brighter affect and cooperative behavior. Pt reports to writer that their goal is to "go to groups and interact with other." Pt states "I was sad this morning when I couldn't talk to anyone but when the peer support person came and we talked, I felt better." Pt reports good sleep with current medication regimen. Denies any current dizziness and subsiding tactile feelings of "tingling and touching."   Assessment completed with the assistance of an interpreter. Pt provided with medications per providers orders. Pt's labs and vitals were monitored throughout the night. Pt given a 1:1 about emotional and mental status. Pt supported and encouraged to express concerns and questions. Writer encouraged patient to communicate needs with staff through means he deemed appropriate. Will write messages if needed. Pt educated on medications. Encouraged to continue fluid intake, pt verbally agrees.   Pt's safety ensured with 15 minute and environmental checks. Pt currently denies SI/HI and A/V hallucinations. Pt verbally agrees to seek staff if SI/HI or A/VH occurs and to consult with staff before acting on any harmful thoughts. 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 Will continue POC.

## 2017-07-15 NOTE — Progress Notes (Signed)
D patient is oberved OOB UAL on the 400 hall today. He is deaf, and communication for this pt is facilitated via interpreter. Writer asks assessment questions and pt answers...he reports he has " awful" anxiety...that it causes his hands to tremble, that this constant trembling causes him physical exhaustion . A HE also shares, via the interpreter, that he has ahd a recent problem of being deydrated and he is given gatorade mixed with water frequently. He says he is beginning to " feel better today"> A He completed hisd aily assessment and on this he wrote he denied SI today and he rated his depression, hopelessness and anxiety " 6/8/8/", respectively. He became very anxious and trmulous, this afternoon, after lunch and was given a prn dose of klonopin 0.5 mg po (which helped a lot). R Safety is in place.

## 2017-07-15 NOTE — Progress Notes (Signed)
Recreation Therapy Notes  Date: 07/15/17 Time: 0930 Location: 300 Hall Dayroom  Group Topic: Stress Management  Goal Area(s) Addresses:  Patient will verbalize importance of using healthy stress management.  Patient will identify positive emotions associated with healthy stress management.   Intervention: Stress Management  Activity :  Authentic Self Meditation.  LRT introduced the stress management technique of meditation.  LRT lead patients in a meditation that allowed them to explore the things that make them who they are.  Education:  Stress Management, Discharge Planning.   Education Outcome: Acknowledges edcuation/In group clarification offered/Needs additional education  Clinical Observations/Feedback: Pt did not attend group.    Tyler Horn, LRT/CTRS         [name] 07/15/2017 12:36 PM 

## 2017-07-16 ENCOUNTER — Encounter (HOSPITAL_COMMUNITY): Payer: Self-pay | Admitting: Registered Nurse

## 2017-07-16 DIAGNOSIS — R197 Diarrhea, unspecified: Secondary | ICD-10-CM

## 2017-07-16 DIAGNOSIS — F411 Generalized anxiety disorder: Secondary | ICD-10-CM

## 2017-07-16 DIAGNOSIS — B20 Human immunodeficiency virus [HIV] disease: Secondary | ICD-10-CM

## 2017-07-16 MED ORDER — LOPERAMIDE HCL 2 MG PO CAPS
2.0000 mg | ORAL_CAPSULE | ORAL | Status: DC | PRN
  Administered 2017-07-16: 4 mg via ORAL
  Administered 2017-07-17: 2 mg via ORAL
  Administered 2017-07-17: 4 mg via ORAL
  Filled 2017-07-16: qty 1
  Filled 2017-07-16 (×2): qty 2

## 2017-07-16 NOTE — Progress Notes (Signed)
D: Pt was in the dayroom upon initial approach.  Pt presents with appropriate, pleasant affect and mood.  Communicated with pt using sign language interpreter.  Pt reports he feels "much better, we finally got to go outside, the fresh air was nice, my anxiety is gone, the diarrhea is gone."  Pt reports his goal was to "continue to be productive, think positive, more group involvement" and he met these goals.  Pt denies SI/HI, denies hallucinations, denies pain.  His gait is steady and he has been ambulating without using assistive device tonight.  Pt attended evening group.    A: Introduced self to pt.  Actively listened to pt and offered support and encouragement. Medications administered per order.  Q15 minute safety checks maintained.  R: Pt is safe on the unit.  Pt is compliant with medications.  Pt verbally contracts for safety.  Will continue to monitor and assess.

## 2017-07-16 NOTE — [facility] Group Notes (Signed)
LCSW Group Therapy Note  Date/Time:  07/16/2017   10:00AM-11:00AM  Type of Therapy and Topic:  Group Therapy:  Fears and Unhealthy/Healthy Coping Skills  Participation Level:  Active   Description of Group:  The focus of this group was to discuss some of the prevalent fears that patients experience, and to identify the commonalities among group members.  An exercise was used to initiate the discussion, followed by writing on the white board a group-generated list of unhealthy coping and healthy coping techniques to deal with each fear.    Therapeutic Goals: 1. Patient will be able to distinguish between healthy and unhealthy coping skills 2. Patient will identify and describe 3 fears they experience 3. Patient will identify one positive coping strategy for each fear they experience 4. Patient will respond empathetically to peers' statements regarding fears they experience  Summary of Patient Progress:  The patient expressed that he has a fear of being alone, adding very tearfully that his therapy dog died this [place].  He talked specifically about being afraid when he is discharged that he is going to be alone again.  He talked about considering getting another therapy dog.  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  Therapeutic Modalities Cognitive Behavioral Therapy Motivational Interviewing  Ambrose MantleMareida Grossman-Orr, LCSW

## 2017-07-16 NOTE — Progress Notes (Signed)
[facility] MD Progress Note ( patient interviewed with Sign Language interpreter) 07/16/2017 11:07 AM Tyler Horn  MRN:  [id number]   Subjective: "I've been up and down all night with diarrhea.  I didn't sleep well because I was up and down."  Reports that he is feeling better with his depression and tolerating medications well.  Objective : Tyler Horn, 56 y.o., male patient seen facet to face by this provider on 07/16/17.  Chart reviewed and consulted with treatment team.  On evaluation Tyler Horn is laying in bed.  Interpreter (sign language) assisted with interview.  Patient reports diarrhea since last night, which kept him up most of the night.  Sleep is usually good other than last night.  States that he is eating without difficulty; tolerating medications without adverse reaction, attending group sessions other than this morning because he is tired.  Today rates depression 7/10 and anxiety 3/10 (0/none and 10/worse).  Patient denies suicidal/homicidal ideation, psychosis, and paranoia.  Still ruminative about the death of his therapy  Dog which he states helped him with his anxiety and depression better than family and friends.     Denies medication side effects. Visible in day room, no disruptive or agitated behaviors .  Labs reviewed- BUN 23, Creatinine 1.24, GFR >60. ( Improved compared to prior)   Principal Problem:  MDD  Diagnosis:   Patient Active Problem List   Diagnosis Date Noted  . MDD (major depressive disorder), recurrent severe, without psychosis ([place]) [F33.2] 07/13/2017  . Major depressive disorder, single episode, severe without psychotic features ([place]) [F32.2]   . MDD (major depressive disorder), recurrent, severe, with psychosis ([place]) [F33.3] 07/31/2014  . Moderate benzodiazepine use disorder ([place]) [F13.20] 07/31/2014  . GAD (generalized anxiety disorder) [F41.1] 07/31/2014  . HIV (human immunodeficiency virus infection) ([place]) [B20]   . MDD (major depressive disorder),  severe ([place]) [F32.2] 07/30/2014  . Delusional disorder ([place]) [F22] 07/30/2014  . Suicidal ideation [R45.851] 07/30/2014  . Hallucinations [R44.3]   . Depression, major, recurrent ([place]) [F33.9] 10/03/2013  . Anxiety, generalized [F41.1] 03/26/2013   Total Time spent with patient: 20 minutes  Past Medical History:  Past Medical History:  Diagnosis Date  . Anxiety   . Depression   . HIV (human immunodeficiency virus infection) ([place])   . HIV (human immunodeficiency virus infection) ([place])    History reviewed. No pertinent surgical history. Family History:  Family History  Problem Relation Age of Onset  . Mental illness Mother   . Bipolar disorder Brother    Social History:  Social History   Substance and Sexual Activity  Alcohol Use No  . Frequency: Never     Social History   Substance and Sexual Activity  Drug Use No    Social History   Socioeconomic History  . Marital status: Single    Spouse name: None  . Number of children: None  . Years of education: None  . Highest education level: None  Social Needs  . Financial resource strain: None  . Food insecurity - worry: None  . Food insecurity - inability: None  . Transportation needs - medical: None  . Transportation needs - non-medical: None  Occupational History  . None  Tobacco Use  . Smoking status: Never Smoker  . Smokeless tobacco: Never Used  Substance and Sexual Activity  . Alcohol use: No    Frequency: Never  . Drug use: No  . Sexual activity: Yes  Other Topics Concern  . None  Social History Narrative  .  None   Additional Social History:   Sleep: improving   Appetite:  Good  Current Medications: Current Facility-Administered Medications  Medication Dose Route Frequency Provider Last Rate Last Dose  . acetaminophen (TYLENOL) tablet 650 mg  650 mg Oral Q6H PRN Ethelene Hal, NP      . alum & mag hydroxide-simeth (MAALOX/MYLANTA) 200-200-20 MG/5ML suspension 30 mL  30 mL Oral Q4H PRN  Ethelene Hal, NP      . clonazePAM [facility] [place]) tablet 0.5 mg  0.5 mg Oral BID PRN Mckenlee Mangham, Myer Peer, MD   0.5 mg at 07/16/17 0809  . emtricitabine-tenofovir AF (DESCOVY) 200-25 MG per tablet 1 tablet  1 tablet Oral Daily Laniyah Rosenwald, Myer Peer, MD   1 tablet at 07/16/17 0809  . escitalopram (LEXAPRO) tablet 20 mg  20 mg Oral Daily Ilka Lovick, Myer Peer, MD   20 mg at 07/16/17 0809  . hydrOXYzine (ATARAX/VISTARIL) tablet 25 mg  25 mg Oral TID PRN Ethelene Hal, NP   25 mg at 07/13/17 1454  . ibuprofen (ADVIL,MOTRIN) tablet 600 mg  600 mg Oral Q8H PRN Ethelene Hal, NP      . loperamide (IMODIUM) capsule 2-4 mg  2-4 mg Oral PRN Dora Simeone, Myer Peer, MD   4 mg at 07/16/17 1002  . magnesium hydroxide (MILK OF MAGNESIA) suspension 30 mL  30 mL Oral Daily PRN Ethelene Hal, NP      . nevirapine [facility]) tablet 200 mg  200 mg Oral Q12H Alizaya Oshea, Myer Peer, MD   200 mg at 07/16/17 0809  . ondansetron (ZOFRAN) tablet 4 mg  4 mg Oral Q8H PRN Ethelene Hal, NP      . QUEtiapine (SEROQUEL) tablet 100 mg  100 mg Oral QHS Ethelene Hal, NP   100 mg at 07/15/17 2101  . traZODone (DESYREL) tablet 50 mg  50 mg Oral QHS PRN Ethelene Hal, NP        Lab Results:  Results for orders placed or performed during the hospital encounter of 07/13/17 (from the past 48 hour(s))  Basic metabolic panel     Status: Abnormal   Collection Time: 07/15/17  6:12 AM  Result Value Ref Range   Sodium 140 135 - 145 mmol/L   Potassium 3.6 3.5 - 5.1 mmol/L   Chloride 103 101 - 111 mmol/L   CO2 31 22 - 32 mmol/L   Glucose, Bld 97 65 - 99 mg/dL   BUN 23 (H) 6 - 20 mg/dL   Creatinine, Ser 1.24 0.61 - 1.24 mg/dL   Calcium 9.2 8.9 - 10.3 mg/dL   GFR calc non Af Amer >60 >60 mL/min   GFR calc Af Amer >60 >60 mL/min    Comment: (NOTE) The eGFR has been calculated using the CKD EPI equation. This calculation has not been validated in all clinical situations. eGFR's persistently <60 mL/min  signify possible Chronic Kidney Disease.    Anion gap 6 5 - 15    Comment: Performed at [facility], [place] [address] [place], [place] [address]  Lipid panel     Status: Abnormal   Collection Time: 07/15/17  6:12 AM  Result Value Ref Range   Cholesterol 181 0 - 200 mg/dL   Triglycerides 113 <150 mg/dL   HDL 31 (L) >40 mg/dL   Total CHOL/HDL Ratio 5.8 RATIO   VLDL 23 0 - 40 mg/dL   LDL Cholesterol 127 (H) 0 - 99 mg/dL    Comment:  Total Cholesterol/HDL:CHD Risk Coronary Heart Disease Risk Table                     Men   Women  1/2 Average Risk   3.4   3.3  Average Risk       5.0   4.4  2 X Average Risk   9.6   7.1  3 X Average Risk  23.4   11.0        Use the calculated Patient Ratio above and the CHD Risk Table to determine the patient's CHD Risk.        ATP III CLASSIFICATION (LDL):  <100     mg/dL   Optimal  100-129  mg/dL   Near or Above                    Optimal  130-159  mg/dL   Borderline  160-189  mg/dL   High  >190     mg/dL   Very High Performed at [facility] [address] [place], [place] [address]   Hemoglobin A1c     Status: None   Collection Time: 07/15/17  6:12 AM  Result Value Ref Range   Hgb A1c MFr Bld 5.1 4.8 - 5.6 %    Comment: (NOTE) Pre diabetes:          5.7%-6.4% Diabetes:              >6.4% Glycemic control for   <7.0% adults with diabetes    Mean Plasma Glucose 99.67 mg/dL    Comment: Performed at [facility] [address], [place],  [address]    Blood Alcohol level:  Lab Results  Component Value Date   [facility] [place] <10 07/13/2017   ETH <11 11/18/2246    Metabolic Disorder Labs: Lab Results  Component Value Date   HGBA1C 5.1 07/15/2017   MPG 99.67 07/15/2017   No results found for: PROLACTIN Lab Results  Component Value Date   CHOL 181 07/15/2017   TRIG 113 07/15/2017   HDL 31 (L) 07/15/2017   CHOLHDL 5.8 07/15/2017   VLDL 23 07/15/2017   LDLCALC 127 (H) 07/15/2017     Physical Findings: AIMS: Facial and Oral Movements Muscles of Facial Expression: None, normal Lips and Perioral Area: None, normal Jaw: None, normal Tongue: None, normal,Extremity Movements Upper (arms, wrists, hands, fingers): None, normal Lower (legs, knees, ankles, toes): None, normal, Trunk Movements Neck, shoulders, hips: None, normal, Overall Severity Severity of abnormal movements (highest score from questions above): None, normal Incapacitation due to abnormal movements: None, normal Patient's awareness of abnormal movements (rate only patient's report): No Awareness, Dental Status Current problems with teeth and/or dentures?: No Does patient usually wear dentures?: No  CIWA:    COWS:     Musculoskeletal: Strength & Muscle Tone: within normal limits Gait & Station: normal Patient leans: N/A  Psychiatric Specialty Exam: Physical Exam  Constitutional: He is oriented to person, place, and time.  Neck: Normal range of motion.  Respiratory: Effort normal.  Musculoskeletal: Normal range of motion.  Neurological: He is alert and oriented to person, place, and time.  Skin: Skin is warm and dry.  Psychiatric: His behavior is normal. Thought content normal. His mood appears anxious. Cognition and memory are normal. He expresses impulsivity. He exhibits a depressed mood.    Review of Systems  Gastrointestinal: Positive for diarrhea (Reports that diarrhea started last night, watery brown stool). Negative for nausea  and vomiting.  Psychiatric/Behavioral: Depression: Improving. Hallucinations: Denies. Memory loss: Denies. Substance abuse: Denies. Suicidal ideas: Denies. Nervous/anxious: Improving. Insomnia: Improved.  Usually good but not last night.   All other systems reviewed and are negative.  no headache, no chest pain, no shortness of breath, no vomiting   Blood pressure 106/68, pulse (!) 115, temperature 97.8 F (36.6 C), temperature source Oral, resp. rate 20, height 6'  2" (1.88 m), weight 95.3 kg (210 lb).Body mass index is 26.96 kg/m.  General Appearance: improved grooming   Eye Contact:  Good  Speech:  Communicates via sign language.   Interpreter (sign language) assisted  Volume:  NA  Mood:  Anxious and Depressed  Affect:  Depressed  Thought Process:  Linear and Descriptions of Associations: Intact  Orientation:  Full (Time, Place, and Person)  Thought Content:  no hallucinations, no delusions , ; Continues to be ruminative about losses, stressors  Suicidal Thoughts:  No  He  denies suicidal or self injurious ideations, contracts for safety on unit   Homicidal Thoughts:  No  Memory:  recent and remote grossly intact   Judgement:  Other:  Continues to improving   Insight:  Continues to improving   Psychomotor Activity:  Normal  Concentration:  Concentration: Good and Attention Span: Good  Recall:  Good  Fund of Knowledge:  Good  Language:  Good  Akathisia:  Negative  Handed:  Right  AIMS (if indicated):     Assets:  Desire for Improvement Resilience  ADL's:  Intact  Cognition:  WNL  Sleep:  Number of Hours: 6.5    Treatment Plan Summary: Daily contact with patient to assess and evaluate symptoms and progress in treatment, Medication management, Plan inpatient treatment  and medications as below  Treatment /milieu participation encouraged to work on coping skills and symptom reduction Increase Lexapro to 20 mgrs QDAY for depression and anxiety Continue Seroquel 100 mgrs QHS for mood disorder, anxiety,insomnia  Continue Klonopin 0.5 mgrs BID PRN for anxiety as needed  Continue Trazodone 50 mgrs QHS PRN for insomnia  Treatment team working on disposition planning options  Started Imodium for diarrhea   Rankin, Shuvon, NP 07/16/2017, 11:07 AM   Agree with NP Progress Note

## 2017-07-16 NOTE — [facility] Group Notes (Signed)
[facility] Group Notes:  (Nursing/MHT/Case Management/Adjunct)  Date:  07/16/2017  Time: 1030  Type of Therapy:  Nurse Education  Participation Level:  Did Not Attend   Summary of Progress/Problems: Pt slept during group time.   Maurine SimmeringShugart, Brittnie Lewey M 07/16/2017, 5:37 PM

## 2017-07-16 NOTE — Progress Notes (Signed)
MHT informed writer that pt had episode of bowel incontinence.  Writer, MHT, and another RN went into pt's room and Clinical research associatewriter communicated with pt using writing.  Informed pt that we are changing his bed and asked him to move to the open bed in his room, which he did.  Pt was provided with personal hygiene items to which he said "thank you."  Provided with scrubs.  Dirty bed linens placed in red biohazard bag in soiled utility closet and mattress was cleaned.  Pt's dirty clothes placed in biohazard bag to be washed.  Pt cooperative with staff during process.  He is currently resting in the other bed in his room with his eyes closed.  AC and on-site provider notified.  On-site provider ordered No roommate.  Will continue to monitor and assess.

## 2017-07-16 NOTE — Plan of Care (Signed)
D: Tyler Horn has polite, cooperative, and compliant with scheduled medication today. He reported having issues with loose stools last night and requested an anti-diarrheal. He denied SI, HI, and AVH, but endorsed anxiety and feeling as if he needed to get out in fresh air.   On his self-inventory form, he reported fair sleep (told this writer he slept poorly), good appetite, normal energy level, and good concentration. He rated his depression 4/10, hopelessness 4/10, and anxiety 8/10. He said he was trying to use coping skills to decrease anxiety, and he hoped to participate more in groups. An interpreter has been on the unit at intervals to assist.   A: Meds given as ordered, including clonazepam twice with successful relief of anxiety and 4 mg loperamide to relieve diarrhea. Q15 safety checks maintained. Support/encouragement offered.  R: Pt remains free from harm and continues with treatment. Will continue to monitor for needs/safety.

## 2017-07-17 NOTE — Progress Notes (Signed)
Patient states that he had a bad morning but felt better in the afternoon. 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 His goal for tomorrow is to attend all of his groups and activities.

## 2017-07-17 NOTE — Progress Notes (Signed)
D: Pt presents with an animated affect and anxious mood. Pt reports decreased depression today. Pt reported feeling anxious, fidgety, having racing thoughts because there was no groups going on, and that he felt trapped because he's locked in the hospital. Pt verbalized that yesterday when he went outside to get some fresh air that helped to decrease his anxiety. Pt requested Klonopin for increase anxiety level. Pt c/o ongoing diarrhea since yesterday. Dr. Jama Flavorsobos made aware. Pt given Imodium at his request and Gatorade. Pt encouraged to increase fluid intake. Pt denies any other symptoms. A: Orders reviewed with pt.  Medications administered as ordered per MD. Verbal support provided. Pt encouraged to attend groups. Pt assessment completed via translator. 15 minute checks performed for safety.   R: Pt stated goal "coping with anxiety"

## 2017-07-17 NOTE — [facility] Group Notes (Signed)
[facility]  Date/Time:  07/17/2017 10:00-11:00AM  Type of Therapy and Topic:  Group Therapy:  Healthy and Unhealthy Supports  Participation Level:  Active   Description of Group:  Patients in this group were introduced to the idea of adding a variety of healthy supports to address the various needs in their lives. The picture on the front of Sunday's workbook was used to demonstrate why more supports are needed in every patient's life.  Patients identified and described healthy supports versus unhealthy supports in general, then gave examples of each in their own lives.   They discussed what additional healthy supports could be helpful in their recovery and wellness after discharge in order to prevent future hospitalizations.   An emphasis was placed on using counselor, doctor, therapy groups, 12-step groups, and problem-specific support groups to expand supports.  They also worked as a group on developing a specific plan for several patients to deal with unhealthy supports through boundary-setting, psychoeducation with loved ones, and even termination of relationships.   Therapeutic Goals:   1)  discuss importance of adding supports to stay well once out of the hospital  2)  compare healthy versus unhealthy supports and identify some examples of each  3)  generate ideas and descriptions of healthy supports that can be added  4)  offer mutual support about how to address unhealthy supports  5)  encourage active participation in and adherence to discharge plan    Summary of Patient Progress:  The patient listened attentively today and received much support from other patients.   Therapeutic Modalities:   Motivational Interviewing Brief Solution-Focused Therapy  Ambrose MantleMareida Grossman-Orr, LCSW 07/17/2017, 11:00AM

## 2017-07-17 NOTE — Progress Notes (Signed)
[facility] MD Progress Note ( patient interviewed with Sign Language interpreter) 07/17/2017 1:54 PM Tyler Horn  MRN:  [id number] Subjective: patient reports improving mood and states he is feeling significantly better than prior to admission. As he improves he states he feels ready to discharge soon. Denies medication side effects. Of note, states he had diarrhea yesterday, which is improving today. He is unsure of cause, does not think it is medication side effect as has been on current medications in the past without this side effect. Denies fever, denies feeling physically ill, states " maybe it was something I ate ". Denies vomiting or nausea , denies blood in stool, denies abdominal pain.  Objective : I have discussed case with treatment team and have met with patient. Patient is presenting with improving mood and range of affect, and as he improves is currently focusing more on disposition planning . 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 He states he will follow with a psychiatrist and therapist locally . He is visible on unit, interactive in milieu, no disruptive behaviors . Denies medication side effects- as noted, states diarrhea now improving and does not attribute it to medication side effects. Denies suicidal ideations and presents future oriented .  Principal Problem:  MDD  Diagnosis:   Patient Active Problem List   Diagnosis Date Noted  . Diarrhea [R19.7]   . Anxiety state [F41.1]   . MDD (major depressive disorder), recurrent severe, without psychosis (HCC) [F33.2] 07/13/2017  . Major depressive disorder, single episode, severe without psychotic features (HCC) [F32.2]   . MDD (major depressive disorder), recurrent, severe, with psychosis (HCC) [F33.3] 07/31/2014  . 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 GAD (generalized anxiety disorder) [F41.1]  07/31/2014  . HIV (human immunodeficiency virus infection) (HCC) [B20]   . MDD (major depressive disorder), severe (HCC) [F32.2] 07/30/2014  . Delusional disorder (HCC) [F22] 07/30/2014  . Suicidal ideation [R45.851] 07/30/2014  . Hallucinations [R44.3]   . Depression, major, recurrent (HCC) [F33.9] 10/03/2013  . Anxiety, generalized [F41.1] 03/26/2013   Total Time spent with patient: 20 minutes  Past Medical History:  Past Medical History:  Diagnosis Date  . Anxiety   . Depression   . HIV (human immunodeficiency virus infection) (HCC)   . HIV (human immunodeficiency virus infection) (HCC)    History reviewed. No pertinent surgical history. Family History:  Family History  Problem Relation Age of Onset  . Mental illness Mother   . Bipolar disorder Brother    Social History:  Social History   Substance and Sexual Activity  Alcohol Use No  . Frequency: Never     Social History   Substance and Sexual Activity  Drug Use No    Social History   Socioeconomic History  . Marital status: Single    Spouse name: None  . Number of children: None  . Years of education: None  . Highest education level: None  Social Needs  . Financial resource strain: None  . Food insecurity - worry: None  . Food insecurity - inability: None  . Transportation needs - medical: None  . Transportation needs - non-medical: None  Occupational History  . None  Tobacco Use  . Smoking status: Never Smoker  . Smokeless tobacco: Never Used  Substance and Sexual Activity  . Alcohol use: No    Frequency: Never  . Drug use: No  . Sexual  activity: Yes  Other Topics Concern  . None  Social History Narrative  . None   Additional Social History:   Sleep: Good  Appetite:  Good  Current Medications: Current Facility-Administered Medications  Medication Dose Route Frequency Provider Last Rate Last Dose  . acetaminophen (TYLENOL) tablet 650 mg  650 mg Oral Q6H PRN Laveda Abbe, NP       . alum & mag hydroxide-simeth (MAALOX/MYLANTA) 200-200-20 MG/5ML suspension 30 mL  30 mL Oral Q4H PRN Laveda Abbe, NP      . clonazePAM [facility]) tablet 0.5 mg  0.5 mg Oral BID PRN Cobos, Rockey Situ, MD   0.5 mg at 07/17/17 1121  . emtricitabine-tenofovir AF (DESCOVY) 200-25 MG per tablet 1 tablet  1 tablet Oral Daily Cobos, Rockey Situ, MD   1 tablet at 07/17/17 0745  . escitalopram (LEXAPRO) tablet 20 mg  20 mg Oral Daily Cobos, Rockey Situ, MD   20 mg at 07/17/17 0745  . hydrOXYzine (ATARAX/VISTARIL) tablet 25 mg  25 mg Oral TID PRN Laveda Abbe, NP   25 mg at 07/13/17 1454  . ibuprofen (ADVIL,MOTRIN) tablet 600 mg  600 mg Oral Q8H PRN Laveda Abbe, NP      . loperamide (IMODIUM) capsule 2-4 mg  2-4 mg Oral PRN Cobos, Rockey Situ, MD   2 mg at 07/17/17 1010  . magnesium hydroxide (MILK OF MAGNESIA) suspension 30 mL  30 mL Oral Daily PRN Laveda Abbe, NP      . nevirapine [facility]) tablet 200 mg  200 mg Oral Q12H Cobos, Rockey Situ, MD   200 mg at 07/17/17 0745  . ondansetron (ZOFRAN) tablet 4 mg  4 mg Oral Q8H PRN Laveda Abbe, NP      . QUEtiapine (SEROQUEL) tablet 100 mg  100 mg Oral QHS Laveda Abbe, NP   100 mg at 07/16/17 2104  . traZODone (DESYREL) tablet 50 mg  50 mg Oral QHS PRN Laveda Abbe, NP        Lab Results:  No results found for this or any previous visit (from the past 48 hour(s)).  Blood Alcohol level:  Lab Results  Component Value Date   [facility] <10 07/13/2017   ETH <11 07/29/2014    Metabolic Disorder Labs: Lab Results  Component Value Date   HGBA1C 5.1 07/15/2017   MPG 99.67 07/15/2017   No results found for: PROLACTIN Lab Results  Component Value Date   CHOL 181 07/15/2017   TRIG 113 07/15/2017   HDL 31 (L) 07/15/2017   CHOLHDL 5.8 07/15/2017   VLDL 23 07/15/2017   LDLCALC 127 (H) 07/15/2017    Physical Findings: AIMS: Facial and Oral Movements Muscles of Facial Expression: None,  normal Lips and Perioral Area: None, normal Jaw: None, normal Tongue: None, normal,Extremity Movements Upper (arms, wrists, hands, fingers): None, normal Lower (legs, knees, ankles, toes): None, normal, Trunk Movements Neck, shoulders, hips: None, normal, Overall Severity Severity of abnormal movements (highest score from questions above): None, normal Incapacitation due to abnormal movements: None, normal Patient's awareness of abnormal movements (rate only patient's report): No Awareness, Dental Status Current problems with teeth and/or dentures?: No Does patient usually wear dentures?: No  CIWA:    COWS:     Musculoskeletal: Strength & Muscle Tone: within normal limits Gait & Station: normal Patient leans: N/A  Psychiatric Specialty Exam: Physical Exam  ROS no headache, no chest pain, no shortness of breath,(+) diarrhea, no blood in stools, no  vomiting, no nausea, no fever, no chills   Blood pressure (!) 105/58, pulse (!) 113, temperature 98.2 F (36.8 C), temperature source Oral, resp. rate 20, height 6\' 2"  (1.88 m), weight 95.3 kg (210 lb).Body mass index is 26.96 kg/m.  General Appearance: Well Groomed  Eye Contact:  Good  Speech:  Normal Rate- communicates via sign language  Volume:  NA  Mood:  mood improving , feels better .  Affect:  Appropriate and fuller in range   Thought Process:  Linear and Descriptions of Associations: Intact  Orientation:  Full (Time, Place, and Person)  Thought Content:  no hallucinations, no delusions   Suicidal Thoughts:  No denies suicidal or self injurious ideations, contracts for safety on unit   Homicidal Thoughts:  No  Memory:  recent and remote grossly intact   Judgement:  Other:  improved   Insight:  Present  Psychomotor Activity:  Normal  Concentration:  Concentration: Good and Attention Span: Good  Recall:  Good  Fund of Knowledge:  Good  Language:  Good  Akathisia:  Negative  Handed:  Right  AIMS (if indicated):     Assets:   Desire for Improvement Resilience  ADL's:  Intact  Cognition:  WNL  Sleep:  Number of Hours: 6.75   Assessment - patient is presenting with improving mood and range of affect . At this time denies suicidal ideations and is future oriented, focusing on disposition planning . Tolerating medications well. Reports diarrhea , now improving , without associated symptoms.   Treatment Plan Summary: Treatment Plan reviewed as below today 11/18 Daily contact with patient to assess and evaluate symptoms and progress in treatment, Medication management, Plan inpatient treatment  and medications as below  Treatment /milieu participation encouraged to work on coping skills and symptom reduction Continue Lexapro  20 mgrs QDAY for depression and anxiety Continue Seroquel 100 mgrs QHS for mood disorder, anxiety,insomnia  Continue Klonopin 0.5 mgrs BID PRN for anxiety as needed  Continue Trazodone 50 mgrs QHS PRN for insomnia  Treatment team working on disposition planning options  Encourage PO fluids  Craige Cotta, MD 07/17/2017, 1:54 PM   Patient ID: [name], male   DOB: 11-Sep-1960, 56 y.o.   MRN: [id number]

## 2017-07-17 NOTE — Progress Notes (Signed)
Patient ID: Tyler PihSteven Horn, male   DOB: March 30, 1961, 56 y.o.   MRN: [id number]  Pt currently presents with a pleasant affect and cooperative behavior. Affect has brightened over the weekend. Pt reports to Clinical research associatewriter that their goal is to "go home tomorrow." Pt states "I am going to be seeing a therapist hopefully once a week and I am going to try to stay around other people. I was surprised at how much less anxiety I feel with my medications." Pt reports good sleep with current medication regimen.  Pt provided with medications per providers orders. Pt's labs and vitals were monitored throughout the night. Pt given a 1:1 about emotional and mental status. Pt supported and encouraged to express concerns and questions. Assessment completed with the assistance of an interpreter. Encouraged maintaining fluids tonight and after discharge.   Pt's safety ensured with 15 minute and environmental checks. Pt currently denies SI/HI and A/V hallucinations. Pt verbally agrees to seek staff if SI/HI or A/VH occurs and to consult with staff before acting on any harmful thoughts. Verbal understanding expressed about education. Pt to be discharged tomorrow. Will continue POC.

## 2017-07-17 NOTE — Progress Notes (Signed)
Pt attended evening wrap up group and stated today was a 10. He was able to speak on the phone with his mother and plan a trip to [place] for thanksgiving. He has set up plans with his psychiatrist Dr. Sandria ManlyLove and is optimistic about is discharge and plans of aftercare.

## 2017-07-18 MED ORDER — QUETIAPINE FUMARATE 100 MG PO TABS: 100.0000 mg | ORAL_TABLET | Freq: Every day | ORAL | 0 refills | Status: DC

## 2017-07-18 MED ORDER — ESCITALOPRAM OXALATE 20 MG PO TABS: 20.0000 mg | ORAL_TABLET | Freq: Every day | ORAL | 0 refills | Status: DC

## 2017-07-18 NOTE — Tx Team (Signed)
Interdisciplinary Treatment and Diagnostic Plan Update  07/18/2017 Time of Session: 11:51 AM  Tyler Horn MRN: [id number]  Principal Diagnosis: MDD  Secondary Diagnoses: Principal Problem:   MDD (major depressive disorder), recurrent severe, without psychosis (HCC) Active Problems:   Diarrhea   Anxiety state   Current Medications:  Current Facility-Administered Medications  Medication Dose Route Frequency Provider Last Rate Last Dose  . acetaminophen (TYLENOL) tablet 650 mg  650 mg Oral Q6H PRN Laveda Abbe, NP      . alum & mag hydroxide-simeth (MAALOX/MYLANTA) 200-200-20 MG/5ML suspension 30 mL  30 mL Oral Q4H PRN Laveda Abbe, NP   30 mL at 07/18/17 1030  . clonazePAM (KLONOPIN) tablet 0.5 mg  0.5 mg Oral BID PRN Cobos, Rockey Situ, MD   0.5 mg at 07/17/17 1121  . emtricitabine-tenofovir AF (DESCOVY) 200-25 MG per tablet 1 tablet  1 tablet Oral Daily Cobos, Rockey Situ, MD   1 tablet at 07/18/17 0813  . escitalopram (LEXAPRO) tablet 20 mg  20 mg Oral Daily Cobos, Rockey Situ, MD   20 mg at 07/18/17 0813  . hydrOXYzine (ATARAX/VISTARIL) tablet 25 mg  25 mg Oral TID PRN Laveda Abbe, NP   25 mg at 07/13/17 1454  . ibuprofen (ADVIL,MOTRIN) tablet 600 mg  600 mg Oral Q8H PRN Laveda Abbe, NP      . loperamide (IMODIUM) capsule 2-4 mg  2-4 mg Oral PRN Cobos, Rockey Situ, MD   4 mg at 07/17/17 2100  . magnesium hydroxide (MILK OF MAGNESIA) suspension 30 mL  30 mL Oral Daily PRN Laveda Abbe, NP      . nevirapine [facility]) tablet 200 mg  200 mg Oral Q12H Cobos, Rockey Situ, MD   200 mg at 07/18/17 0813  . ondansetron (ZOFRAN) tablet 4 mg  4 mg Oral Q8H PRN Laveda Abbe, NP      . QUEtiapine (SEROQUEL) tablet 100 mg  100 mg Oral QHS Laveda Abbe, NP   100 mg at 07/17/17 2100  . traZODone (DESYREL) tablet 50 mg  50 mg Oral QHS PRN Laveda Abbe, NP        PTA Medications: Medications Prior to Admission  Medication Sig  Dispense Refill Last Dose  . clonazePAM (KLONOPIN) 0.5 MG tablet Take 0.5 mg 2 (two) times daily by mouth.    07/12/2017 at Unknown time  . emtricitabine-tenofovir (TRUVADA) 200-300 MG per tablet Take 1 tablet by mouth daily. For HIV infection   07/12/2017 at Unknown time  . escitalopram (LEXAPRO) 10 MG tablet Take 10 mg daily by mouth.  1 07/12/2017 at Unknown time  . fluvoxaMINE (LUVOX) 25 MG tablet Take 3 tablets (75 mg total) by mouth daily. For depression (Patient not taking: Reported on 07/13/2017) 30 tablet 0 Not Taking at Unknown time  . gabapentin (NEURONTIN) 300 MG capsule Take 1 capsule (300 mg total) by mouth 3 (three) times daily. For agitation/pain managment (Patient not taking: Reported on 07/13/2017) 90 capsule 0 Not Taking at Unknown time  . mirtazapine (REMERON) 30 MG tablet Take 1 tablet (30 mg total) by mouth at bedtime. For depression/sleep (Patient not taking: Reported on 07/13/2017) 30 tablet 0 Not Taking at Unknown time  . Nevirapine (VIRAMUNE XR) 400 MG TB24 Take 1 tablet by mouth daily. For HIV infection 30 tablet  07/12/2017 at Unknown time  . QUEtiapine (SEROQUEL) 100 MG tablet Take 100 mg at bedtime by mouth.   07/12/2017 at Unknown time  . QUEtiapine (SEROQUEL)  200 MG tablet Take 1 tablet (200 mg total) by mouth at bedtime. For mood control (Patient not taking: Reported on 07/13/2017) 30 tablet 0 Not Taking at Unknown time    Patient Stressors: Health problems Loss of service dog in June  Patient Strengths: Ability for insight Active sense of humor Average or above average intelligence Capable of independent living [employer] knowledge Motivation for treatment/growth  Treatment Modalities: Medication Management, Group therapy, Case management,  1 to 1 session with clinician, Psychoeducation, Recreational therapy.   Physician Treatment Plan for Primary Diagnosis: MDD Long Term Goal(s): Improvement in symptoms so as ready for discharge  Short Term Goals:  Ability to identify changes in lifestyle to reduce recurrence of condition will improve Ability to maintain clinical measurements within normal limits will improve Ability to identify changes in lifestyle to reduce recurrence of condition will improve Ability to identify and develop effective coping behaviors will improve Ability to maintain clinical measurements within normal limits will improve  Medication Management: Evaluate patient's response, side effects, and tolerance of medication regimen.  Therapeutic Interventions: 1 to 1 sessions, Unit Group sessions and Medication administration.  Evaluation of Outcomes: Adequate for Discharge  Physician Treatment Plan for Secondary Diagnosis: Principal Problem:   MDD (major depressive disorder), recurrent severe, without psychosis (HCC) Active Problems:   Diarrhea   Anxiety state   Long Term Goal(s): Improvement in symptoms so as ready for discharge  Short Term Goals: Ability to identify changes in lifestyle to reduce recurrence of condition will improve Ability to maintain clinical measurements within normal limits will improve Ability to identify changes in lifestyle to reduce recurrence of condition will improve Ability to identify and develop effective coping behaviors will improve Ability to maintain clinical measurements within normal limits will improve  Medication Management: Evaluate patient's response, side effects, and tolerance of medication regimen.  Therapeutic Interventions: 1 to 1 sessions, Unit Group sessions and Medication administration.  Evaluation of Outcomes: Adequate for Discharge   RN Treatment Plan for Primary Diagnosis: MDD Long Term Goal(s): Knowledge of disease and therapeutic regimen to maintain health will improve  Short Term Goals: Ability to identify and develop effective coping behaviors will improve and Compliance with prescribed medications will improve  Medication Management: RN will administer  medications as ordered by provider, will assess and evaluate patient's response and provide education to patient for prescribed medication. RN will report any adverse and/or side effects to prescribing provider.  Therapeutic Interventions: 1 on 1 counseling sessions, Psychoeducation, Medication administration, Evaluate responses to treatment, Monitor vital signs and CBGs as ordered, Perform/monitor CIWA, COWS, AIMS and Fall Risk screenings as ordered, Perform wound care treatments as ordered.  Evaluation of Outcomes: Adequate for Discharge   LCSW Treatment Plan for Primary Diagnosis: MDD Long Term Goal(s): Safe transition to appropriate next level of care at discharge, Engage patient in therapeutic group addressing interpersonal concerns.  Short Term Goals: Engage patient in aftercare planning with referrals and resources  Therapeutic Interventions: Assess for all discharge needs, 1 to 1 time with Social worker, Explore available resources and support systems, Assess for adequacy in community support network, Educate family and significant other(s) on suicide prevention, Complete Psychosocial Assessment, Interpersonal group therapy.  Evaluation of Outcomes: Adequate for Discharge   Progress in Treatment: Attending groups: Yes Participating in groups: Yes Taking medication as prescribed: Yes Toleration medication: Yes, no side effects reported at this time Family/Significant other contact made: No  Pt refused Patient understands diagnosis: Yes AEB asking for help with anxiety  and depression Discussing patient identified problems/goals with staff: Yes Medical problems stabilized or resolved: Yes Denies suicidal/homicidal ideation: Yes Issues/concerns per patient self-inventory: None Other: N/A  New problem(s) identified: None identified at this time.   New Short Term/Long Term Goal(s): "I'm was feeling suicidal because of my intense anxiety and racing thoughts."  Discharge Plan or  Barriers:   Reason for Continuation of Hospitalization:  None identified at this time.  Estimated Length of Stay: 0 days; Pt will likely discharge today 07/18/17  Attendees: Patient:  07/18/2017  11:51 AM  Physician: Nehemiah MassedFernando Cobos, MD 07/18/2017  11:51 AM  Nursing: Geryl Councilmanhrista, RN; Patrice, RN 07/18/2017  11:51 AM  RN Care Manager: Onnie BoerJennifer Clark, RN 07/18/2017  11:51 AM  Social Worker: Donnelly StagerLynn Javelle Donigan, LCSWA  07/18/2017  11:51 AM  Recreational Therapist: Aggie CosierMarjette Lindsey 07/18/2017  11:51 AM  Other: Tomasita Morrowelora Sutton 07/18/2017  11:51 AM  Other:  07/18/2017  11:51 AM    Scribe for Treatment Team:  Jonathon JordanLynn B Graden Hoshino, MSW, LCSWA  07/18/2017 11:51 AM

## 2017-07-18 NOTE — Progress Notes (Addendum)
  [facility] Discharge [facility] :  Will you be returning to the same living situation after discharge:  Yes,  pt returning home. At discharge, do you have transportation home?: Yes,  pt has access to transportation. Do you have the ability to pay for your medications: Yes,  pt has insurance.  Release of information consent forms completed and in the chart;  Patient's signature needed at discharge.  Patient to Follow up at: Follow-up Information    [facility] Follow up on 08/08/2017.   Specialty:  Behavioral Health Why:  Initial appointment for therapy on 12/10 2 PM w Beth.  Please bring your insurance card and photo ID to this appointment.   Contact information: [address] [place] [id number] [phone]       [facility] Follow up on 11/18/2017.   Why:  Medication management appointment 3/22 at 9am with Dr. Sandria ManlyLove. This was the first available appointment for your preferred provider. Contact information: [address] [place], [place] [address] P: [phone] F: [phone]          Next level of care provider [facility] Link:yes  Safety Planning and Suicide Prevention discussed: Yes,  with pt.  Have you used any form of tobacco in the last 30 days? (Cigarettes, Smokeless Tobacco, Cigars, and/or Pipes): No  Has patient been referred to the Quitline?: N/A patient is not a smoker  Patient has been referred for addiction treatment: N/A  Jonathon JordanLynn B Bryant, MSW, LCSWA 07/20/2017, 4:01 PM

## 2017-07-18 NOTE — Progress Notes (Signed)
Patient discharged to lobby. Patient was stable and appreciative at that time. All papers and prescriptions were given and valuables returned. Verbal understanding expressed. Denies SI/HI and A/VH. Patient given opportunity to express concerns and ask questions.  

## 2017-07-18 NOTE — Discharge Summary (Signed)
Physician Discharge Summary Note  Patient:  Tyler Horn is an 56 y.o., male MRN:  [id number] DOB:  11-17-1960 Patient phone:  [phone] (home)  Patient address:   [address] [place] [address],  Total Time spent with patient: 30 minutes  Date of Admission:  07/13/2017 Date of Discharge: 07/18/2017  Reason for Admission:Per HPI-  56 year old single male, hearing impaired, interviewed via sign language interpreter. Presented to hospital voluntarily . Reports he has history of anxiety, depression, which has worsened over recent weeks , and which he feels started after his therapy dog died . Since then he feels it has been a " downward spiral". Reports he has had recent suicidal ideations of driving his car off the road, and has had episodes of severe anxiety/tension. He endorses some neuro-vegetative symptoms as below  Principal Problem: MDD (major depressive disorder), recurrent severe, without psychosis [facility]) Discharge Diagnoses: Patient Active Problem List   Diagnosis Date Noted  . Diarrhea [R19.7]   . Anxiety state [F41.1]   . MDD (major depressive disorder), recurrent severe, without psychosis (HCC) [F33.2] 07/13/2017  . Major depressive disorder, single episode, severe without psychotic features (HCC) [F32.2]   . MDD (major depressive disorder), recurrent, severe, with psychosis (HCC) [F33.3] 07/31/2014  . 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 GAD (generalized anxiety disorder) [F41.1] 07/31/2014  . HIV (human immunodeficiency virus infection) (HCC) [B20]   . MDD (major depressive disorder), severe (HCC) [F32.2] 07/30/2014  . Delusional disorder (HCC) [F22] 07/30/2014  . Suicidal ideation [R45.851] 07/30/2014  . Hallucinations [R44.3]   . Depression, major, recurrent (HCC) [F33.9] 10/03/2013  . Anxiety, generalized [F41.1] 03/26/2013    Past Psychiatric History:   Past Medical History:  Past Medical History:  Diagnosis Date  .  Anxiety   . Depression   . HIV (human immunodeficiency virus infection) (HCC)   . HIV (human immunodeficiency virus infection) (HCC)    History reviewed. No pertinent surgical history. Family History:  Family History  Problem Relation Age of Onset  . Mental illness Mother   . Bipolar disorder Brother    Family Psychiatric  History: Social History:  Social History   Substance and Sexual Activity  Alcohol Use No  . Frequency: Never     Social History   Substance and Sexual Activity  Drug Use No    Social History   Socioeconomic History  . Marital status: Single    Spouse name: None  . Number of children: None  . Years of education: None  . Highest education level: None  Social Needs  . Financial resource strain: None  . Food insecurity - worry: None  . Food insecurity - inability: None  . Transportation needs - medical: None  . Transportation needs - non-medical: None  Occupational History  . None  Tobacco Use  . Smoking status: Never Smoker  . Smokeless tobacco: Never Used  Substance and Sexual Activity  . Alcohol use: No    Frequency: Never  . Drug use: No  . Sexual activity: Yes  Other Topics Concern  . None  Social History Narrative  . None    Hospital Course:  Tyler Horn was admitted for MDD (major depressive disorder), recurrent severe, without psychosis (HCC) and crisis management.  Pt was treated discharged with the medications listed below under Medication List.  Medical problems were identified and treated as needed.  Home medications were restarted as appropriate.  Improvement was monitored by observation and Tyler Horn 's  daily report of symptom reduction.  Emotional and mental status was monitored by daily self-inventory reports completed by Tyler Horn and clinical staff.         Tyler Horn was evaluated by the treatment team for stability and plans for continued recovery upon discharge. Tyler Horn 's motivation was an integral  factor for scheduling further treatment. Employment, transportation, bed availability, health status, family support, and any pending legal issues were also considered during hospital stay. Pt was offered further treatment options upon discharge including but not limited to Residential, Intensive Outpatient, and Outpatient treatment.  Tyler Horn will follow up with the services as listed below under Follow Up Information.     Upon completion of this admission the patient was both mentally and medically stable for discharge denying suicidal/homicidal ideation, auditory/visual/tactile hallucinations, delusional thoughts and paranoia.    Tyler Horn responded well to treatment with Lexapro 20 mg and Seroquel 100 mg without adverse effects. Pt demonstrated improvement without reported or observed adverse effects to the point of stability appropriate for outpatient management. Pertinent labs include: Lipid,BMP and CBC for which outpatient follow-up is necessary for lab recheck as mentioned below. Reviewed CBC, CMP, BAL, and UDS; all unremarkable aside from noted exceptions.   Physical Findings: AIMS: Facial and Oral Movements Muscles of Facial Expression: None, normal Lips and Perioral Area: None, normal Jaw: None, normal Tongue: None, normal,Extremity Movements Upper (arms, wrists, hands, fingers): None, normal Lower (legs, knees, ankles, toes): None, normal, Trunk Movements Neck, shoulders, hips: None, normal, Overall Severity Severity of abnormal movements (highest score from questions above): None, normal Incapacitation due to abnormal movements: None, normal Patient's awareness of abnormal movements (rate only patient's report): No Awareness, Dental Status Current problems with teeth and/or dentures?: No Does patient usually wear dentures?: No  CIWA:    COWS:     Musculoskeletal: Strength & Muscle Tone: within normal limits Gait & Station: normal Patient leans: N/A  Psychiatric  Specialty Exam:  See SRA BY MD  Physical Exam  Vitals reviewed. Constitutional: He appears well-developed.  Cardiovascular: Normal rate.  Neurological: He is alert.  Psychiatric: He has a normal mood and affect. His behavior is normal.    Review of Systems  Psychiatric/Behavioral: Negative for depression (stable), hallucinations and suicidal ideas. Nervous/anxious: improving.     Blood pressure 117/70, pulse (!) 112, temperature 98.1 F (36.7 C), temperature source Oral, resp. rate 16, height 6\' 2"  (1.88 m), weight 95.3 kg (210 lb).Body mass index is 26.96 kg/m.   Have you used any form of tobacco in the last 30 days? (Cigarettes, Smokeless Tobacco, Cigars, and/or Pipes): No  Has this patient used any form of tobacco in the last 30 days? (Cigarettes, Smokeless Tobacco, Cigars, and/or Pipes)  No  Blood Alcohol level:  Lab Results  Component Value Date   ETH <10 07/13/2017   ETH <11 07/29/2014    Metabolic Disorder Labs:  Lab Results  Component Value Date   HGBA1C 5.1 07/15/2017   MPG 99.67 07/15/2017   No results found for: PROLACTIN Lab Results  Component Value Date   CHOL 181 07/15/2017   TRIG 113 07/15/2017   HDL 31 (L) 07/15/2017   CHOLHDL 5.8 07/15/2017   VLDL 23 07/15/2017   LDLCALC 127 (H) 07/15/2017    See Psychiatric Specialty Exam and Suicide Risk Assessment completed by Attending Physician prior to discharge.  Discharge destination:  Home  Is patient on multiple antipsychotic therapies at discharge:  No   Has Patient had three or  more failed trials of antipsychotic monotherapy by history:  No  Recommended Plan for Multiple Antipsychotic Therapies: NA   Allergies as of 07/18/2017   No Known Allergies     Medication List    STOP taking these medications   fluvoxaMINE 25 MG tablet Commonly known as:  LUVOX   gabapentin 300 MG capsule Commonly known as:  NEURONTIN   mirtazapine 30 MG tablet Commonly known as:  REMERON     TAKE these  medications     Indication  clonazePAM 0.5 MG tablet Commonly known as:  KLONOPIN Take 0.5 mg 2 (two) times daily by mouth.  Indication:  mood   emtricitabine-tenofovir 200-300 MG tablet Commonly known as:  TRUVADA Take 1 tablet by mouth daily. For HIV infection  Indication:  HIV Disease   escitalopram 20 MG tablet Commonly known as:  LEXAPRO Take 1 tablet (20 mg total) daily by mouth. Start taking on:  07/19/2017 What changed:    medication strength  how much to take  Indication:  Major Depressive Disorder   Nevirapine 400 MG Tb24 Commonly known as:  VIRAMUNE XR Take 1 tablet by mouth daily. For HIV infection  Indication:  HIV Disease   QUEtiapine 100 MG tablet Commonly known as:  SEROQUEL Take 1 tablet (100 mg total) at bedtime by mouth. What changed:  Another medication with the same name was removed. Continue taking this medication, and follow the directions you see here.  Indication:  Depressive Phase of Manic-Depression      Follow-up Information    [facility] Follow up.   Specialty:  Behavioral Health Why:  Social worker has made a referral on your behalf for therapy. Awaiting time and date of your appointments. Contact information: [address] [place] [address] [phone]       [facility] Follow up on 11/18/2017.   Why:  Medication management appointment 3/22 at 9am with Dr. Sandria Manly. This was the first available appointment for your preferred provider. Contact information: [address] [place], [place] [address] P: [phone] F: [phone]          Follow-up recommendations:  Activity:  as tolerated Diet:  heart healthy  Comments:  Take all medications as prescribed. Keep all follow-up appointments as scheduled.  Do not consume alcohol or use illegal drugs while on prescription medications. Report any adverse effects from your medications to your  primary care provider promptly.  In the event of recurrent symptoms or worsening symptoms, call 911, a crisis hotline, or go to the nearest emergency department for evaluation.   Signed: Oneta Rack, NP 07/18/2017, 11:48 AM    Patient seen, Suicide Assessment Completed.  Disposition Plan Reviewed

## 2017-07-18 NOTE — Progress Notes (Signed)
Recreation Therapy Notes  Date: 07/18/17 Time: 0930 Location:  [address] Dayroom  Group Topic: Stress Management  Goal Area(s) Addresses:  Patient will verbalize importance of using healthy stress management.  Patient will identify positive emotions associated with healthy stress management.   Behavioral Response: Engaged  Intervention: Stress Management  Activity :  Meditation.  LRT introduced the stress management technique of meditation.  LRT read Horn script for patients to exam the things they need to shed the baggage they have been carrying to prepare for the new things to come.  Education:  Stress Management, Discharge Planning.   Education Outcome: Acknowledges edcuation/In group clarification offered/Needs additional education  Clinical Observations/Feedback: Pt attended group.     Tyler Horn, LRT/CTRS         Tyler RancherLindsay, Tyler Horn 07/18/2017 11:25 AM

## 2017-07-18 NOTE — [facility] Suicide Risk Assessment (Signed)
[facility]   Principal Problem: MDD (major depressive disorder), recurrent severe, without psychosis (HCC) Discharge Diagnoses:  Patient Active Problem List   Diagnosis Date Noted  . Diarrhea [R19.7]   . Anxiety state [F41.1]   . MDD (major depressive disorder), recurrent severe, without psychosis (HCC) [F33.2] 07/13/2017  . Major depressive disorder, single episode, severe without psychotic features (HCC) [F32.2]   . MDD (major depressive disorder), recurrent, severe, with psychosis (HCC) [F33.3] 07/31/2014  . 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 GAD (generalized anxiety disorder) [F41.1] 07/31/2014  . HIV (human immunodeficiency virus infection) (HCC) [B20]   . MDD (major depressive disorder), severe (HCC) [F32.2] 07/30/2014  . Delusional disorder (HCC) [F22] 07/30/2014  . Suicidal ideation [R45.851] 07/30/2014  . Hallucinations [R44.3]   . Depression, major, recurrent (HCC) [F33.9] 10/03/2013  . Anxiety, generalized [F41.1] 03/26/2013    Total Time spent with patient: 30 minutes  Musculoskeletal: Strength & Muscle Tone: within normal limits Gait & Station: normal Patient leans: N/A  Psychiatric Specialty Exam: ROS denies headache, denies chest pain , no shortness of breath, no vomiting   Blood pressure 117/70, pulse (!) 112, temperature 98.1 F (36.7 C), temperature source Oral, resp. rate 16, height 6\' 2"  (1.88 m), weight 95.3 kg (210 lb).Body mass index is 26.96 kg/m.  General Appearance: Neat  Eye Contact::  Good  Speech:  Normal Rate409  Volume:  NA  Mood:  improved and denies depression at this time  Affect:  Appropriate and Full Range  Thought Process:  Linear and Descriptions of Associations: Intact  Orientation:  Full (Time, Place, and Person)  Thought Content:  no hallucinations, no delusions, not internally preoccupied   Suicidal Thoughts:  No denies any suicidal or self injurious ideations, denies any  homicidal or violent ideations   Homicidal Thoughts:  No  Memory:  recent and remote grossly intact   Judgement:  Other:  improving   Insight:  improving   Psychomotor Activity:  Normal  Concentration:  Good  Recall:  Good  Fund of Knowledge:Good  Language: Good  Akathisia:  Negative  Handed:  Right  AIMS (if indicated):     Assets:  Communication Skills Desire for Improvement Resilience  Sleep:  Number of Hours: 6.75  Cognition: WNL  ADL's:  Intact   Mental Status Per Nursing Assessment::   On Admission:     Demographic Factors:  56 year old male, single, lives alone, employed   Loss Factors: Death of therapy dog   Historical Factors: Depression, anxiety, no history of suicide attempts,one prior psychiatric admission in 2015 . HIV (+)    Risk Reduction Factors:   Employed and Positive coping skills or problem solving skills  Continued Clinical Symptoms:  At this time patient presents fully alert, attentive, calm, well related, mood improved and currently presents euthymic, with a fuller range of affect, no thought disorder, no suicidal or self injurious ideations, no hallucinations , no delusions, future oriented. Denies medication side effects. Behavior on unit in good control, pleasant on approach .  Cognitive Features That Contribute To Risk:  No gross cognitive deficits noted upon discharge. Is alert , attentive, and oriented x 3   Suicide Risk:  Mild:  Suicidal ideation of limited frequency, intensity, duration, and specificity.  There are no identifiable plans, no associated intent, mild dysphoria and related symptoms, good self-control (both objective and subjective assessment), few other risk factors, and identifiable protective factors, including available and accessible social support.  Plan Of Care/Follow-up recommendations:  Activity:  as tolerated Diet:  regular Tests:  NA Other:  see below Patient is expressing readiness for discharge and is  leaving unit  in good spirits  Plans to follow up with outpatient therapist and outpatient psychiatrist ( Dr. Sandria ManlyLove)  Plans to attend Peer Support Groups . Has an established PCP and ID Clinic in [place] for management of chronic medical illness  Craige CottaFernando A Stephaine Breshears, MD 07/18/2017, 8:53 AM

## 2017-07-20 ENCOUNTER — Ambulatory Visit (HOSPITAL_COMMUNITY): Payer: Self-pay | Admitting: Licensed Clinical Social Worker

## 2017-08-08 ENCOUNTER — Ambulatory Visit (HOSPITAL_COMMUNITY): Payer: Self-pay | Admitting: Licensed Clinical Social Worker

## 2017-08-11 ENCOUNTER — Other Ambulatory Visit (HOSPITAL_COMMUNITY): Payer: Self-pay | Admitting: Psychiatry

## 2017-08-14 ENCOUNTER — Other Ambulatory Visit (HOSPITAL_COMMUNITY): Payer: Self-pay | Admitting: Psychiatry

## 2019-10-25 ENCOUNTER — Ambulatory Visit (HOSPITAL_COMMUNITY)
Admission: RE | Admit: 2019-10-25 | Discharge: 2019-10-26 | Disposition: A | Discharge status: Home / Self Care | Payer: Self-pay | Attending: Emergency Medicine | Admitting: Emergency Medicine

## 2019-10-25 DIAGNOSIS — F32A Depression, unspecified: Secondary | ICD-10-CM

## 2019-10-25 DIAGNOSIS — Z818 Family history of other mental and behavioral disorders: Secondary | ICD-10-CM | POA: Insufficient documentation

## 2019-10-25 DIAGNOSIS — Z21 Asymptomatic human immunodeficiency virus [HIV] infection status: Secondary | ICD-10-CM | POA: Insufficient documentation

## 2019-10-25 DIAGNOSIS — Z79899 Other long term (current) drug therapy: Secondary | ICD-10-CM | POA: Insufficient documentation

## 2019-10-25 DIAGNOSIS — F339 Major depressive disorder, recurrent, unspecified: Secondary | ICD-10-CM | POA: Insufficient documentation

## 2019-10-25 DIAGNOSIS — R45851 Suicidal ideations: Secondary | ICD-10-CM | POA: Insufficient documentation

## 2019-10-25 DIAGNOSIS — F329 Major depressive disorder, single episode, unspecified: Secondary | ICD-10-CM

## 2019-10-25 DIAGNOSIS — B20 Human immunodeficiency virus [HIV] disease: Secondary | ICD-10-CM

## 2019-10-25 DIAGNOSIS — F331 Major depressive disorder, recurrent, moderate: Secondary | ICD-10-CM | POA: Diagnosis present

## 2019-10-25 DIAGNOSIS — F13232 Sedative, hypnotic or anxiolytic dependence with withdrawal with perceptual disturbance: Secondary | ICD-10-CM

## 2019-10-25 DIAGNOSIS — U071 COVID-19: Secondary | ICD-10-CM | POA: Insufficient documentation

## 2019-10-25 DIAGNOSIS — H9190 Unspecified hearing loss, unspecified ear: Secondary | ICD-10-CM | POA: Insufficient documentation

## 2019-10-25 DIAGNOSIS — F419 Anxiety disorder, unspecified: Secondary | ICD-10-CM | POA: Insufficient documentation
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
## 2019-10-26 ENCOUNTER — Observation Stay (HOSPITAL_COMMUNITY): Payer: Self-pay

## 2019-10-26 ENCOUNTER — Encounter (HOSPITAL_COMMUNITY): Payer: Self-pay | Admitting: Psychiatry

## 2019-10-26 DIAGNOSIS — F13232 Sedative, hypnotic or anxiolytic dependence with withdrawal with perceptual disturbance: Secondary | ICD-10-CM

## 2019-10-26 DIAGNOSIS — F332 Major depressive disorder, recurrent severe without psychotic features: Secondary | ICD-10-CM

## 2019-10-26 DIAGNOSIS — F411 Generalized anxiety disorder: Secondary | ICD-10-CM

## 2019-10-26 HISTORY — DX: Sedative, hypnotic or anxiolytic dependence with withdrawal with perceptual disturbance: F13.232

## 2019-10-26 LAB — COMPREHENSIVE METABOLIC PANEL
ALT: 20 U/L (ref 0–44)
AST: 20 U/L (ref 15–41)
Albumin: 3.9 g/dL (ref 3.5–5.0)
Alkaline Phosphatase: 87 U/L (ref 38–126)
Anion gap: 10 (ref 5–15)
BUN: 21 mg/dL — ABNORMAL HIGH (ref 6–20)
CO2: 24 mmol/L (ref 22–32)
Calcium: 8.7 mg/dL — ABNORMAL LOW (ref 8.9–10.3)
Chloride: 102 mmol/L (ref 98–111)
Creatinine, Ser: 1.02 mg/dL (ref 0.61–1.24)
GFR calc Af Amer: >60 mL/min (ref >60)
GFR calc non Af Amer: >60 mL/min (ref >60)
Glucose, Bld: 96 mg/dL (ref 70–99)
Potassium: 3.6 mmol/L (ref 3.5–5.1)
Sodium: 136 mmol/L (ref 135–145)
Total Bilirubin: 0.5 mg/dL (ref 0.3–1.2)
Total Protein: 7.4 g/dL (ref 6.5–8.1)

## 2019-10-26 LAB — CBC WITH DIFFERENTIAL/PLATELET
Abs Immature Granulocytes: 0.01 10*3/uL (ref 0.00–0.07)
Basophils Absolute: 0 10*3/uL (ref 0.0–0.1)
Basophils Relative: 1 %
Eosinophils Absolute: 0.2 10*3/uL (ref 0.0–0.5)
Eosinophils Relative: 4 %
HCT: 46.7 % (ref 39.0–52.0)
Hemoglobin: 16.3 g/dL (ref 13.0–17.0)
Immature Granulocytes: 0 %
Lymphocytes Relative: 36 %
Lymphs Abs: 1.9 10*3/uL (ref 0.7–4.0)
MCH: 31.6 pg (ref 26.0–34.0)
MCHC: 34.9 g/dL (ref 30.0–36.0)
MCV: 90.5 fL (ref 80.0–100.0)
Monocytes Absolute: 0.6 10*3/uL (ref 0.1–1.0)
Monocytes Relative: 12 %
Neutro Abs: 2.4 10*3/uL (ref 1.7–7.7)
Neutrophils Relative %: 47 %
Platelets: 189 10*3/uL (ref 150–400)
RBC: 5.16 MIL/uL (ref 4.22–5.81)
RDW: 12.7 % (ref 11.5–15.5)
WBC: 5.2 10*3/uL (ref 4.0–10.5)
nRBC: 0 % (ref 0.0–0.2)

## 2019-10-26 LAB — RESPIRATORY PANEL BY RT PCR (FLU A&B, COVID)
Influenza A by PCR: NEGATIVE
Influenza B by PCR: NEGATIVE
SARS Coronavirus 2 by RT PCR: POSITIVE — AB

## 2019-10-26 LAB — ETHANOL: Alcohol, Ethyl (B): <10 mg/dL (ref <10)

## 2019-10-26 LAB — ACETAMINOPHEN LEVEL: Acetaminophen (Tylenol), Serum: <10 ug/mL — ABNORMAL LOW (ref 10–30)

## 2019-10-26 LAB — SALICYLATE LEVEL: Salicylate Lvl: <7 mg/dL — ABNORMAL LOW (ref 7.0–30.0)

## 2019-10-26 MED ORDER — LORAZEPAM 1 MG PO TABS
1.0000 mg | ORAL_TABLET | Freq: Once | ORAL | Status: AC
  Administered 2019-10-26: 06:00:00 1 mg via ORAL

## 2019-10-26 NOTE — Discharge Instructions (Signed)
For your behavioral health needs, you are advised to continue treatment with your current outpatient providers. °

## 2019-10-26 NOTE — ED Provider Notes (Signed)
IVCd by Mom. Hearing imp. COVID positive. Meets Inpatient criteria by psych. Pending UA and UDS. Med cleared. Needs Med Rec. 1 week without meds. Needs home meds reordered   Physical Exam  BP 118/73   Pulse 73   Temp 97.8 F (36.6 C) (Oral)   Resp 18   Ht 6' (1.829 m)   Wt 95.3 kg   SpO2 91%   BMI 28.48 kg/m   Physical Exam Vitals and nursing note reviewed.  Constitutional:      General: He is not in acute distress.    Appearance: He is well-developed. He is not diaphoretic.  HENT:     Head: Atraumatic.  Eyes:     Pupils: Pupils are equal, round, and reactive to light.  Cardiovascular:     Rate and Rhythm: Normal rate and regular rhythm.  Pulmonary:     Effort: Pulmonary effort is normal. No respiratory distress.  Abdominal:     General: There is no distension.     Palpations: Abdomen is soft.  Musculoskeletal:        General: Normal range of motion.     Cervical back: Normal range of motion and neck supple.  Skin:    General: Skin is warm and dry.  Neurological:     Mental Status: He is alert.     ED Course/Procedures     Procedures Labs Reviewed  RESPIRATORY PANEL BY RT PCR (FLU A&B, COVID) - Abnormal; Notable for the following components:      Result Value   SARS Coronavirus 2 by RT PCR POSITIVE (*)    All other components within normal limits  COMPREHENSIVE METABOLIC PANEL - Abnormal; Notable for the following components:   BUN 21 (*)    Calcium 8.7 (*)    All other components within normal limits  ACETAMINOPHEN LEVEL - Abnormal; Notable for the following components:   Acetaminophen (Tylenol), Serum <10 (*)    All other components within normal limits  SALICYLATE LEVEL - Abnormal; Notable for the following components:   Salicylate Lvl <7.0 (*)    All other components within normal limits  CBC WITH DIFFERENTIAL/PLATELET  ETHANOL   MDM  COVID pos, IVCd by Mom. Hearing impaired. Meets inpatient criteria. Medically cleared. Needs to clarify placement of  patient for inpatient given COVID status.  Per Psych note given COVID positive will need to be tx to [facility]. Per nursing [facility] no longer accepting patients. Attempted to consult with Psych NP and TTS. No answer. Will need to contact for placement, Here at [facility] vs other Psych inpatient at outside facility?  Per Psych NP who discussed with attending Dr. Fredderick Phenix  Psych feels patient can contract for safety. They have cleared patient and will discharge home and rescind IVF  Patient discharged by [facility], Mellina Benison A, PA-C 10/26/19 1406    Rolan Bucco, MD 10/27/19 1109

## 2019-10-26 NOTE — ED Notes (Signed)
Pt lying in bed awake. Interpreter at bedside. Full monitor on. Pt denies any needs. Pt changed into scrubs. Will continue to monitor.

## 2019-10-26 NOTE — Consult Note (Signed)
[facility] Psych ED Discharge  10/26/2019 1:13 PM Tyler Horn  MRN:  [id number] Principal Problem: Depression, major, recurrent [facility]) Discharge Diagnoses: Principal Problem:   Depression, major, recurrent ([place])   Subjective: Patient assessed by nurse practitioner along with Dr. Dwyane Dee.  Patient alert and oriented, answers appropriately.  Sign language interpreter assisted with interview, present at bedside. Patient reports he "came to behavioral health to get treatment for depression, I was overwhelmed because I lost my job and several friends to [employer]." Patient denies suicidal and homicidal ideations.  Patient denies history of self-harm.  Patient reports prior suicide attempt "once a long time ago."  Patient denies auditory and visual hallucinations. Patient reports he lives alone in [place].  Patient seen [facility] [place].  Patient sees outpatient psychiatry and therapy via zoom online.  Patient currently compliant with home medications include including Klonopin, Lexapro, Seroquel.  Patient reports case manager with [facility], Laural Benes with [facility] [employer] [facility]. Patient aware of Covid positive status, patient plans to quarantine for 14 days. Patient gives consent to speak with his mother, Tyler Horn. Patient's mother, Tyler Horn: verbalizes concern regarding patient's depression and recent loss of employment. Patient's mother reports loss of employment "was a shock to him." Patient's mother lives in [place] approximately 2.5 hours away. Patient's mother denies knowledge of weapons in patient's home, per patient's mother patient no history of suicide attempts.   Total Time spent with patient: 30 minutes  Past Psychiatric History: Major depressive disorder  Past Medical History:  Past Medical History:  Diagnosis Date  . Anxiety   . Depression   . HIV (human immunodeficiency virus infection) ([place])   . HIV (human  immunodeficiency virus infection) ([place])    No past surgical history on file. Family History:  Family History  Problem Relation Age of Onset  . Mental illness Mother   . Bipolar disorder Brother    Family Psychiatric  History: Denies Social History:  Social History   Substance and Sexual Activity  Alcohol Use No     Social History   Substance and Sexual Activity  Drug Use No    Social History   Socioeconomic History  . Marital status: Single    Spouse name: Not on file  . Number of children: Not on file  . Years of education: Not on file  . Highest education level: Not on file  Occupational History  . Not on file  Tobacco Use  . Smoking status: Never Smoker  . Smokeless tobacco: Never Used  Substance and Sexual Activity  . Alcohol use: No  . Drug use: No  . Sexual activity: Yes  Other Topics Concern  . Not on file  Social History Narrative  . Not on file   Social Determinants of Health   Financial Resource Strain:   . Difficulty of Paying Living Expenses: Not on file  Food Insecurity:   . Worried About Charity fundraiser in the Last Year: Not on file  . Ran Out of Food in the Last Year: Not on file  Transportation Needs:   . Lack of Transportation (Medical): Not on file  . Lack of Transportation (Non-Medical): Not on file  Physical Activity:   . Days of Exercise per Week: Not on file  . Minutes of Exercise per Session: Not on file  Stress:   . Feeling of Stress : Not on file  Social Connections:   . Frequency of Communication with Friends and Family: Not  on file  . Frequency of Social Gatherings with Friends and Family: Not on file  . Attends Religious Services: Not on file  . Active Member of Clubs or Organizations: Not on file  . Attends Banker Meetings: Not on file  . Marital Status: Not on file    Has this patient used any form of tobacco in the last 30 days? (Cigarettes, Smokeless Tobacco, Cigars, and/or Pipes) A prescription for an  FDA-approved tobacco cessation medication was offered at discharge and the patient refused  Current Medications: No current facility-administered medications for this encounter.   Current Outpatient Medications  Medication Sig Dispense Refill  . clonazePAM (KLONOPIN) 0.5 MG tablet Take 0.5 mg by mouth daily as needed for anxiety.     Marland Kitchen emtricitabine-tenofovir (TRUVADA) 200-300 MG per tablet Take 1 tablet by mouth daily. For HIV infection    . escitalopram (LEXAPRO) 20 MG tablet Take 1 tablet (20 mg total) daily by mouth. 30 tablet 0  . Nevirapine (VIRAMUNE XR) 400 MG TB24 Take 1 tablet by mouth daily. For HIV infection 30 tablet   . QUEtiapine (SEROQUEL) 100 MG tablet Take 1 tablet (100 mg total) at bedtime by mouth. 30 tablet 0   PTA Medications: (Not in a hospital admission)   Musculoskeletal: Strength & Muscle Tone: within normal limits Gait & Station: normal Patient leans: N/A  Psychiatric Specialty Exam: Physical Exam Vitals and nursing note reviewed.  Constitutional:      Appearance: He is well-developed.  HENT:     Head: Normocephalic.  Cardiovascular:     Rate and Rhythm: Normal rate.  Pulmonary:     Effort: Pulmonary effort is normal.  Neurological:     Mental Status: He is alert and oriented to person, place, and time.     Review of Systems  Constitutional: Negative.   HENT: Negative.   Eyes: Negative.   Respiratory: Negative.   Cardiovascular: Negative.   Gastrointestinal: Negative.   Genitourinary: Negative.   Musculoskeletal: Negative.   Skin: Negative.   Neurological: Negative.     Blood pressure (!) 117/51, pulse 72, temperature 97.8 F (36.6 C), temperature source Oral, resp. rate 18, height 6' (1.829 m), weight 95.3 kg, SpO2 93 %.Body mass index is 28.48 kg/m.  General Appearance: Casual and Fairly Groomed  Eye Contact:  Good  Speech:  Clear and Coherent and Normal Rate  Volume:  Normal  Mood:  Depressed  Affect:  Appropriate and Congruent   Thought Process:  Coherent, Goal Directed and Descriptions of Associations: Intact  Orientation:  Full (Time, Place, and Person)  Thought Content:  WDL and Logical  Suicidal Thoughts:  No  Homicidal Thoughts:  No  Memory:  Immediate;   Good Recent;   Good Remote;   Good  Judgement:  Good  Insight:  Good  Psychomotor Activity:  Normal  Concentration:  Concentration: Good and Attention Span: Good  Recall:  Good  Fund of Knowledge:  Good  Language:  Good  Akathisia:  No  Handed:  Right  AIMS (if indicated):     Assets:  Communication Skills Desire for Improvement Financial Resources/Insurance Housing Intimacy Leisure Time Physical Health Resilience Social Support  ADL's:  Intact  Cognition:  WNL  Sleep:        Demographic Factors:  Male  Loss Factors: Decrease in vocational status  Historical Factors: NA  Risk Reduction Factors:   Positive social support, Positive therapeutic relationship and Positive coping skills or problem solving skills  Continued Clinical Symptoms:  Cognitive Features That Contribute To Risk:  None    Suicide Risk:  Minimal: No identifiable suicidal ideation.  Patients presenting with no risk factors but with morbid ruminations; may be classified as minimal risk based on the severity of the depressive symptoms    Plan Of Care/Follow-up recommendations:  Other:  Follow up with established outpatient psychiatry  Disposition: Discharge  Patrcia Dolly, FNP 10/26/2019, 1:13 PM

## 2019-10-26 NOTE — ED Notes (Signed)
Pt lying in bed. Full monitor on. Pt denies any needs. Will continue to monitor.

## 2019-10-26 NOTE — ED Triage Notes (Signed)
Pt presenting from [facility] following a Positive covid test. Pt is deaf, no resp distress. Pt is IVC'ed for SI.

## 2019-10-26 NOTE — ED Notes (Signed)
Discharged arranged by Helmut Muster RN Case Manager for transportation. Sitter in Medical sales representative as clean point person, escorted pt to the [place]. Pt work a mask, face shield and gown for the ride with uber. Pt was cooperative and anxious to go.

## 2019-10-26 NOTE — BH Assessment (Signed)
[facility] Assessment Progress Note  Per Berneice Heinrich, FNP, this pt does not require psychiatric hospitalization at this time.  Pt is to be discharged from [facility].  Inetta Fermo reports that pt has extensive wrap-around services in place.  Discharge instructions advise pt to continue treatment with his current outpatient providers.  Pt's nurse has been notified.  Doylene Canning, MA Triage Specialist [phone]

## 2019-10-26 NOTE — ED Provider Notes (Signed)
[facility] DEPT Provider Note   CSN: [id number] Arrival date & time: 10/26/19  0156     History Chief Complaint  Patient presents with  . Depression  . Suicidal    Tyler Horn is a 59 y.o. male with a hx of HIV, anxiety, depression presents to the Emergency Department complaining of gradual, persistent, progressively worsening depression and anxiety.  Patient initially presented to [facility] under IVC initiated by his mother due to decreased oral intake and listlessness at home.  Mother reported the patient was depressed and angry and left messages that he was indicating that he wanted to die.  Patient admits to being suicidal but does not have a plan.  Patient reports he lost his job 2 weeks ago and has been severely depressed since that time.  He has a history of suicide attempt via overdose several years ago but denies self-harm including overdose tonight.  He reports he feels ready to give up and hopeless.  Patient evaluated by [facility] who recommended inpatient treatment.  While there, he had a positive COVID-19 test.  Patient reports he had his first dose of the vaccine on October 05, 2019.  He states 3 days of cough but no other symptoms.  He denies headache, loss of taste or smell, body aches, fever, chills, shortness of breath abdominal pain, nausea, vomiting, diarrhea.  Patient with unknown sick contacts  The history is provided by the patient and medical records. The history is limited by a language barrier. A language interpreter was used.       Past Medical History:  Diagnosis Date  . Anxiety   . Depression   . HIV (human immunodeficiency virus infection) ([place])   . HIV (human immunodeficiency virus infection) [facility])     Patient Active Problem List   Diagnosis Date Noted  . Diarrhea   . Anxiety state   . MDD (major depressive disorder), recurrent severe, without psychosis ([place]) 07/13/2017  . Major depressive disorder, single episode, severe  without psychotic features ([place])   . MDD (major depressive disorder), recurrent, severe, with psychosis ([place]) 07/31/2014  . Moderate benzodiazepine use disorder ([place]) 07/31/2014  . GAD (generalized anxiety disorder) 07/31/2014  . HIV (human immunodeficiency virus infection) ([place])   . MDD (major depressive disorder), severe ([place]) 07/30/2014  . Delusional disorder ([place]) 07/30/2014  . Suicidal ideation 07/30/2014  . Hallucinations   . Depression, major, recurrent ([place]) 10/03/2013  . Anxiety, generalized 03/26/2013    No past surgical history on file.     Family History  Problem Relation Age of Onset  . Mental illness Mother   . Bipolar disorder Brother     Social History   Tobacco Use  . Smoking status: Never Smoker  . Smokeless tobacco: Never Used  Substance Use Topics  . Alcohol use: No  . Drug use: No    Home Medications Prior to Admission medications   Medication Sig Start Date End Date Taking? Authorizing Provider  clonazePAM (KLONOPIN) 0.5 MG tablet Take 0.5 mg 2 (two) times daily by mouth.     [provider]  emtricitabine-tenofovir (TRUVADA) 200-300 MG per tablet Take 1 tablet by mouth daily. For HIV infection 08/06/14   Lindell Spar I, NP  escitalopram (LEXAPRO) 20 MG tablet Take 1 tablet (20 mg total) daily by mouth. 07/19/17   Derrill Center, NP  Nevirapine (VIRAMUNE XR) 400 MG TB24 Take 1 tablet by mouth daily. For HIV infection 08/06/14   Encarnacion Slates, NP  QUEtiapine (SEROQUEL) 100 MG tablet Take 1 tablet (100 mg total) at bedtime by mouth. 07/18/17   Oneta Rack, NP    Allergies    Patient has no known allergies.  Review of Systems   Review of Systems  Constitutional: Negative for appetite change, diaphoresis, fatigue, fever and unexpected weight change.  HENT: Negative for mouth sores.   Eyes: Negative for visual disturbance.  Respiratory: Positive for cough. Negative for chest tightness, shortness of breath and wheezing.    Cardiovascular: Negative for chest pain.  Gastrointestinal: Negative for abdominal pain, constipation, diarrhea, nausea and vomiting.  Endocrine: Negative for polydipsia, polyphagia and polyuria.  Genitourinary: Negative for dysuria, frequency, hematuria and urgency.  Musculoskeletal: Negative for back pain and neck stiffness.  Skin: Negative for rash.  Allergic/Immunologic: Negative for immunocompromised state.  Neurological: Negative for syncope, light-headedness and headaches.  Hematological: Does not bruise/bleed easily.  Psychiatric/Behavioral: Positive for depression and suicidal ideas. Negative for sleep disturbance. The patient is nervous/anxious.     Physical Exam Updated Vital Signs BP 118/73   Pulse 73   Temp 97.8 F (36.6 C) (Oral)   Resp 18   Ht 6' (1.829 m)   Wt 95.3 kg   SpO2 91%   BMI 28.48 kg/m   Physical Exam Vitals and nursing note reviewed.  Constitutional:      General: He is not in acute distress.    Appearance: He is not diaphoretic.  HENT:     Head: Normocephalic.  Eyes:     General: No scleral icterus.    Conjunctiva/sclera: Conjunctivae normal.  Cardiovascular:     Rate and Rhythm: Normal rate and regular rhythm.     Pulses: Normal pulses.          Radial pulses are 2+ on the right side and 2+ on the left side.  Pulmonary:     Effort: No tachypnea, accessory muscle usage, prolonged expiration, respiratory distress or retractions.     Breath sounds: No stridor.     Comments: Equal chest rise. No increased work of breathing. Abdominal:     General: There is no distension.     Palpations: Abdomen is soft.     Tenderness: There is no abdominal tenderness. There is no guarding or rebound.  Musculoskeletal:     Cervical back: Normal range of motion.     Comments: Moves all extremities equally and without difficulty.  Skin:    General: Skin is warm and dry.     Capillary Refill: Capillary refill takes less than 2 seconds.  Neurological:      Mental Status: He is alert.     GCS: GCS eye subscore is 4. GCS verbal subscore is 5. GCS motor subscore is 6.     Comments: Speech is clear and goal oriented.  Psychiatric:        Mood and Affect: Mood is anxious and depressed.        Behavior: Behavior normal. Behavior is cooperative.        Thought Content: Thought content normal.        Cognition and Memory: Cognition normal.        Judgment: Judgment normal.     ED Results / Procedures / Treatments   Labs (all labs ordered are listed, but only abnormal results are displayed) Labs Reviewed  RESPIRATORY PANEL BY RT PCR (FLU A&B, COVID) - Abnormal; Notable for the following components:      Result Value   SARS Coronavirus 2 by RT PCR  POSITIVE (*)    All other components within normal limits  COMPREHENSIVE METABOLIC PANEL - Abnormal; Notable for the following components:   BUN 21 (*)    Calcium 8.7 (*)    All other components within normal limits  ACETAMINOPHEN LEVEL - Abnormal; Notable for the following components:   Acetaminophen (Tylenol), Serum <10 (*)    All other components within normal limits  SALICYLATE LEVEL - Abnormal; Notable for the following components:   Salicylate Lvl <7.0 (*)    All other components within normal limits  CBC WITH DIFFERENTIAL/PLATELET  ETHANOL    EKG EKG Interpretation  Date/Time:  Friday October 26 2019 02:52:43 EST Ventricular Rate:  85 PR Interval:    QRS Duration: 89 QT Interval:  359 QTC Calculation: 427 R Axis:   110 Text Interpretation: Sinus rhythm Right axis deviation Confirmed by Drema Pry [id number]) on 10/26/2019 5:47:35 AM   Radiology DG Chest Port 1 View  Result Date: 10/26/2019 CLINICAL DATA:  COVID-19 positive EXAM: PORTABLE CHEST 1 VIEW COMPARISON:  None. FINDINGS: Single frontal view of the chest demonstrates unremarkable cardiac silhouette. Lung volumes are diminished, with patchy bibasilar consolidation representing atelectasis or bibasilar pneumonia. No  effusion or pneumothorax. No acute bony abnormalities. IMPRESSION: 1. Patchy bibasilar consolidation, favor atelectasis given low lung volumes. Electronically Signed   By: Sharlet Salina M.D.   On: 10/26/2019 03:07    Procedures Procedures (including critical care time)  Medications Ordered in ED Medications  LORazepam (ATIVAN) tablet 1 mg (1 mg Oral Given 10/26/19 8676)    ED Course  I have reviewed the triage vital signs and the nursing notes.  Pertinent labs & imaging results that were available during my care of the patient were reviewed by me and considered in my medical decision making (see chart for details).    MDM Rules/Calculators/A&P                       Azekiel Cremer was evaluated in Emergency Department on 10/26/2019 for the symptoms described in the history of present illness. He was evaluated in the context of the global COVID-19 pandemic, which necessitated consideration that the patient might be at risk for infection with the SARS-CoV-2 virus that causes COVID-19. Institutional protocols and algorithms that pertain to the evaluation of patients at risk for COVID-19 are in a state of rapid change based on information released by regulatory bodies including the CDC and federal and state organizations. These policies and algorithms were followed during the patient's care in the ED.  Patient presents from behavioral health.  He was IVC by his mother who was concerned for his safety given his change in mental status.  Patient is alert, cooperative and forthright.  He does admit to being depressed and suicidal without a plan.  Labs reassuring.  Urinalysis and UDS pending.  At this time there is no evidence of emergency medical condition which requires intervention.  Patient does meet inpatient criteria.  Home meds will need to be ordered after medication conciliation.  I have personally discussed this with the pharmacist who will work on completing this.  6:20 AM UA, UDS and med  rec pending.  Pt signed out to [facility], PA-C who will follow and order home meds.   Final Clinical Impression(s) / ED Diagnoses Final diagnoses:  COVID-19  Suicidal thoughts  Depression, unspecified depression type    Rx / DC Orders ED Discharge Orders    None  Sarena Jezek, Boyd Kerbs 10/26/19 5038    Nira Conn, MD 10/26/19 [phone]

## 2019-10-26 NOTE — ED Provider Notes (Addendum)
Briefly, the patient is a 59 y.o. male with h/o HIV, anxiety, depressionpresents to the Emergency Department complaining of gradual, persistent, progressively worsening depression and anxiety.  Vitals:   10/26/19 0500 10/26/19 0515  BP: 96/66 118/73  Pulse: 71 73  Resp: 16 18  Temp:    SpO2: 91% 91%    CONSTITUTIONAL:  well-appearing, NAD NEURO:  Alert and oriented x 3, no focal deficits EYES:  pupils equal and reactive ENT/NECK:  trachea midline, no JVD CARDIO:  reg rate, reg rhythm, well-perfused PULM:  None labored breathing GI/GU:  Abdomin non-distended MSK/SPINE:  No gross deformities, no edema SKIN:  no rash, atraumatic PSYCH:  anxious   IVC'd by mother Work up with COVID+. Otherwise reassuring.        Nira Conn, MD 10/26/19 [phone]

## 2019-10-26 NOTE — ED Notes (Signed)
Patient resting in bed. This nurse applied o2 saturation monitor to finger, cycled blood pressure cuff. Wrote request on paper for Berneice Heinrich NP to speak with patient's mother, to which patient agreed.
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
## 2019-10-26 NOTE — Progress Notes (Signed)
TOC consult received regarding patient needing assistance with transportation. CSW called [facility] to and confirmed if they can find transportation for patient as patient is COVID+. Transportation Services reports they are able to transport patient. CSW had patient sign a waiver and it was sent over to transportation services to being the process.   Geralyn Corwin, LCSW Transitions of Care Department [facility] ED [phone]

## 2019-10-26 NOTE — ED Notes (Signed)
Pt lying in bed, eye's closed, chest rising and falling. Will continue to monitor.  

## 2019-11-13 ENCOUNTER — Encounter: Payer: Self-pay | Admitting: Family

## 2019-11-13 ENCOUNTER — Ambulatory Visit: Payer: BLUE CROSS/BLUE SHIELD

## 2019-11-13 ENCOUNTER — Other Ambulatory Visit: Payer: BLUE CROSS/BLUE SHIELD

## 2019-11-13 ENCOUNTER — Other Ambulatory Visit (HOSPITAL_COMMUNITY)
Admission: RE | Admit: 2019-11-13 | Discharge: 2019-11-13 | Disposition: A | Discharge status: Home / Self Care | Payer: BLUE CROSS/BLUE SHIELD | Source: Ambulatory Visit | Attending: Family | Admitting: Family

## 2019-11-13 ENCOUNTER — Other Ambulatory Visit: Payer: Self-pay

## 2019-11-13 DIAGNOSIS — B20 Human immunodeficiency virus [HIV] disease: Secondary | ICD-10-CM

## 2019-11-13 DIAGNOSIS — Z113 Encounter for screening for infections with a predominantly sexual mode of transmission: Secondary | ICD-10-CM

## 2019-11-13 DIAGNOSIS — Z79899 Other long term (current) drug therapy: Secondary | ICD-10-CM

## 2019-11-13 NOTE — Addendum Note (Signed)
Addended byJimmy Picket F on: 11/13/2019 09:40 AM   Modules accepted: Orders

## 2019-11-13 NOTE — Addendum Note (Signed)
Addended byJimmy Picket F on: 11/13/2019 10:19 AM   Modules accepted: Orders

## 2019-11-13 NOTE — Addendum Note (Signed)
Addended by: Andree Coss on: 11/13/2019 10:45 AM   Modules accepted: Orders

## 2019-11-14 LAB — T-HELPER CELL (CD4) - (RCID CLINIC ONLY)
CD4 % Helper T Cell: 34 % (ref 33–65)
CD4 T Cell Abs: 681 /uL (ref 400–1790)

## 2019-11-14 LAB — URINE CYTOLOGY ANCILLARY ONLY
Chlamydia: NEGATIVE
Comment: NEGATIVE
Comment: NORMAL
Neisseria Gonorrhea: NEGATIVE

## 2019-11-17 LAB — CBC WITH DIFFERENTIAL/PLATELET
Absolute Monocytes: 640 cells/uL (ref 200–950)
Basophils Absolute: 59 cells/uL (ref 0–200)
Basophils Relative: 0.9 %
Eosinophils Absolute: 211 cells/uL (ref 15–500)
Eosinophils Relative: 3.2 %
HCT: 48.1 % (ref 38.5–50.0)
Hemoglobin: 17 g/dL (ref 13.2–17.1)
Lymphs Abs: 2112 cells/uL (ref 850–3900)
MCH: 32.3 pg (ref 27.0–33.0)
MCHC: 35.3 g/dL (ref 32.0–36.0)
MCV: 91.3 fL (ref 80.0–100.0)
MPV: 9.3 fL (ref 7.5–12.5)
Monocytes Relative: 9.7 %
Neutro Abs: 3577 cells/uL (ref 1500–7800)
Neutrophils Relative %: 54.2 %
Platelets: 337 10*3/uL (ref 140–400)
RBC: 5.27 10*6/uL (ref 4.20–5.80)
RDW: 12.9 % (ref 11.0–15.0)
Total Lymphocyte: 32 %
WBC: 6.6 10*3/uL (ref 3.8–10.8)

## 2019-11-17 LAB — HEPATITIS B CORE ANTIBODY, TOTAL: Hep B Core Total Ab: REACTIVE — AB

## 2019-11-17 LAB — HEPATITIS A ANTIBODY, TOTAL: Hepatitis A AB,Total: NONREACTIVE

## 2019-11-17 LAB — QUANTIFERON-TB GOLD PLUS
Mitogen-NIL: >10 IU/mL
NIL: 0.1 IU/mL
QuantiFERON-TB Gold Plus: NEGATIVE
TB1-NIL: 0.03 IU/mL
TB2-NIL: 0.02 IU/mL

## 2019-11-17 LAB — COMPREHENSIVE METABOLIC PANEL
AG Ratio: 1.3 (calc) (ref 1.0–2.5)
ALT: 32 U/L (ref 9–46)
AST: 23 U/L (ref 10–35)
Albumin: 4.4 g/dL (ref 3.6–5.1)
Alkaline phosphatase (APISO): 152 U/L — ABNORMAL HIGH (ref 35–144)
BUN: 16 mg/dL (ref 7–25)
CO2: 28 mmol/L (ref 20–32)
Calcium: 10 mg/dL (ref 8.6–10.3)
Chloride: 105 mmol/L (ref 98–110)
Creat: 0.93 mg/dL (ref 0.70–1.33)
Globulin: 3.3 g/dL (calc) (ref 1.9–3.7)
Glucose, Bld: 99 mg/dL (ref 65–99)
Potassium: 4.6 mmol/L (ref 3.5–5.3)
Sodium: 135 mmol/L (ref 135–146)
Total Bilirubin: 0.4 mg/dL (ref 0.2–1.2)
Total Protein: 7.7 g/dL (ref 6.1–8.1)

## 2019-11-17 LAB — URINALYSIS
Bilirubin Urine: NEGATIVE
Glucose, UA: NEGATIVE
Leukocytes,Ua: NEGATIVE
Nitrite: NEGATIVE
Specific Gravity, Urine: 1.025 (ref 1.001–1.03)
pH: 5.5 (ref 5.0–8.0)

## 2019-11-17 LAB — LIPID PANEL
Cholesterol: 238 mg/dL — ABNORMAL HIGH (ref <200)
HDL: 38 mg/dL — ABNORMAL LOW (ref >=40)
LDL Cholesterol (Calc): 179 mg/dL (calc) — ABNORMAL HIGH
Non-HDL Cholesterol (Calc): 200 mg/dL (calc) — ABNORMAL HIGH (ref <130)
Total CHOL/HDL Ratio: 6.3 (calc) — ABNORMAL HIGH (ref <5.0)
Triglycerides: 95 mg/dL (ref <150)

## 2019-11-17 LAB — RPR: RPR Ser Ql: REACTIVE — AB

## 2019-11-17 LAB — HEPATITIS B SURFACE ANTIGEN: Hepatitis B Surface Ag: NONREACTIVE

## 2019-11-17 LAB — HCV RNA,QUANTITATIVE REAL TIME PCR
HCV Quantitative Log: 1.52 Log IU/mL — ABNORMAL HIGH
HCV RNA, PCR, QN: 33 IU/mL — ABNORMAL HIGH

## 2019-11-17 LAB — HIV-1 RNA ULTRAQUANT REFLEX TO GENTYP+
HIV 1 RNA Quant: <20 copies/mL
HIV-1 RNA Quant, Log: <1.3 Log copies/mL

## 2019-11-17 LAB — FLUORESCENT TREPONEMAL AB(FTA)-IGG-BLD: Fluorescent Treponemal ABS: REACTIVE — AB

## 2019-11-17 LAB — HLA B*5701: HLA-B*5701 w/rflx HLA-B High: NEGATIVE

## 2019-11-17 LAB — HIV ANTIBODY (ROUTINE TESTING W REFLEX): HIV 1&2 Ab, 4th Generation: REACTIVE — AB

## 2019-11-17 LAB — HEPATITIS C ANTIBODY
Hepatitis C Ab: REACTIVE — AB
SIGNAL TO CUT-OFF: 34.3 — ABNORMAL HIGH (ref <1.00)

## 2019-11-17 LAB — RPR TITER: RPR Titer: 1:16 {titer} — ABNORMAL HIGH

## 2019-11-17 LAB — HIV-1/2 AB - DIFFERENTIATION
HIV-1 antibody: POSITIVE — AB
HIV-2 Ab: NEGATIVE

## 2019-11-17 LAB — HEPATITIS B SURFACE ANTIBODY,QUALITATIVE: Hep B S Ab: REACTIVE — AB

## 2019-11-26 ENCOUNTER — Encounter (HOSPITAL_COMMUNITY): Payer: Self-pay | Admitting: Emergency Medicine

## 2019-11-26 ENCOUNTER — Other Ambulatory Visit: Payer: Self-pay

## 2019-11-26 ENCOUNTER — Emergency Department (HOSPITAL_COMMUNITY)
Admission: EM | Admit: 2019-11-26 | Discharge: 2019-11-28 | Disposition: A | Discharge status: Other Acute Inpatient Hospital | Payer: BLUE CROSS/BLUE SHIELD | Attending: Emergency Medicine | Admitting: Emergency Medicine

## 2019-11-26 ENCOUNTER — Ambulatory Visit (HOSPITAL_COMMUNITY)
Admission: AD | Admit: 2019-11-26 | Discharge: 2019-11-26 | Disposition: A | Discharge status: Home / Self Care | Payer: Self-pay | Attending: Psychiatry | Admitting: Psychiatry

## 2019-11-26 DIAGNOSIS — Y929 Unspecified place or not applicable: Secondary | ICD-10-CM | POA: Insufficient documentation

## 2019-11-26 DIAGNOSIS — R440 Auditory hallucinations: Secondary | ICD-10-CM | POA: Insufficient documentation

## 2019-11-26 DIAGNOSIS — F4310 Post-traumatic stress disorder, unspecified: Secondary | ICD-10-CM | POA: Insufficient documentation

## 2019-11-26 DIAGNOSIS — Z915 Personal history of self-harm: Secondary | ICD-10-CM | POA: Insufficient documentation

## 2019-11-26 DIAGNOSIS — F419 Anxiety disorder, unspecified: Secondary | ICD-10-CM | POA: Insufficient documentation

## 2019-11-26 DIAGNOSIS — F333 Major depressive disorder, recurrent, severe with psychotic symptoms: Secondary | ICD-10-CM

## 2019-11-26 DIAGNOSIS — Z21 Asymptomatic human immunodeficiency virus [HIV] infection status: Secondary | ICD-10-CM | POA: Insufficient documentation

## 2019-11-26 DIAGNOSIS — F329 Major depressive disorder, single episode, unspecified: Secondary | ICD-10-CM | POA: Insufficient documentation

## 2019-11-26 DIAGNOSIS — R45851 Suicidal ideations: Secondary | ICD-10-CM | POA: Insufficient documentation

## 2019-11-26 DIAGNOSIS — Y9389 Activity, other specified: Secondary | ICD-10-CM | POA: Insufficient documentation

## 2019-11-26 DIAGNOSIS — F332 Major depressive disorder, recurrent severe without psychotic features: Secondary | ICD-10-CM

## 2019-11-26 DIAGNOSIS — T450X2A Poisoning by antiallergic and antiemetic drugs, intentional self-harm, initial encounter: Secondary | ICD-10-CM | POA: Insufficient documentation

## 2019-11-26 DIAGNOSIS — Y999 Unspecified external cause status: Secondary | ICD-10-CM | POA: Insufficient documentation

## 2019-11-26 DIAGNOSIS — Z8616 Personal history of COVID-19: Secondary | ICD-10-CM | POA: Insufficient documentation

## 2019-11-26 DIAGNOSIS — X838XXA Intentional self-harm by other specified means, initial encounter: Secondary | ICD-10-CM | POA: Insufficient documentation

## 2019-11-26 DIAGNOSIS — Z1389 Encounter for screening for other disorder: Secondary | ICD-10-CM | POA: Insufficient documentation

## 2019-11-26 DIAGNOSIS — U071 COVID-19: Secondary | ICD-10-CM | POA: Insufficient documentation

## 2019-11-26 DIAGNOSIS — T1491XA Suicide attempt, initial encounter: Secondary | ICD-10-CM

## 2019-11-26 DIAGNOSIS — Z79899 Other long term (current) drug therapy: Secondary | ICD-10-CM | POA: Insufficient documentation

## 2019-11-26 LAB — RAPID URINE DRUG SCREEN, HOSP PERFORMED
Amphetamines: NOT DETECTED
Barbiturates: NOT DETECTED
Benzodiazepines: NOT DETECTED
Cocaine: NOT DETECTED
Opiates: NOT DETECTED
Tetrahydrocannabinol: NOT DETECTED

## 2019-11-26 LAB — CBC WITH DIFFERENTIAL/PLATELET
Abs Immature Granulocytes: 0.05 10*3/uL (ref 0.00–0.07)
Basophils Absolute: 0.1 10*3/uL (ref 0.0–0.1)
Basophils Relative: 1 %
Eosinophils Absolute: 0.2 10*3/uL (ref 0.0–0.5)
Eosinophils Relative: 2 %
HCT: 52.5 % — ABNORMAL HIGH (ref 39.0–52.0)
Hemoglobin: 17.6 g/dL — ABNORMAL HIGH (ref 13.0–17.0)
Immature Granulocytes: 1 %
Lymphocytes Relative: 31 %
Lymphs Abs: 3.1 10*3/uL (ref 0.7–4.0)
MCH: 30.7 pg (ref 26.0–34.0)
MCHC: 33.5 g/dL (ref 30.0–36.0)
MCV: 91.6 fL (ref 80.0–100.0)
Monocytes Absolute: 0.9 10*3/uL (ref 0.1–1.0)
Monocytes Relative: 9 %
Neutro Abs: 5.7 10*3/uL (ref 1.7–7.7)
Neutrophils Relative %: 56 %
Platelets: 381 10*3/uL (ref 150–400)
RBC: 5.73 MIL/uL (ref 4.22–5.81)
RDW: 13 % (ref 11.5–15.5)
WBC: 10.1 10*3/uL (ref 4.0–10.5)
nRBC: 0 % (ref 0.0–0.2)

## 2019-11-26 LAB — COMPREHENSIVE METABOLIC PANEL
ALT: 15 U/L (ref 0–44)
AST: 17 U/L (ref 15–41)
Albumin: 4.9 g/dL (ref 3.5–5.0)
Alkaline Phosphatase: 139 U/L — ABNORMAL HIGH (ref 38–126)
Anion gap: 12 (ref 5–15)
BUN: 24 mg/dL — ABNORMAL HIGH (ref 6–20)
CO2: 25 mmol/L (ref 22–32)
Calcium: 9.6 mg/dL (ref 8.9–10.3)
Chloride: 106 mmol/L (ref 98–111)
Creatinine, Ser: 1.17 mg/dL (ref 0.61–1.24)
GFR calc Af Amer: >60 mL/min (ref >60)
GFR calc non Af Amer: >60 mL/min (ref >60)
Glucose, Bld: 100 mg/dL — ABNORMAL HIGH (ref 70–99)
Potassium: 3.7 mmol/L (ref 3.5–5.1)
Sodium: 143 mmol/L (ref 135–145)
Total Bilirubin: 0.9 mg/dL (ref 0.3–1.2)
Total Protein: 8.5 g/dL — ABNORMAL HIGH (ref 6.5–8.1)

## 2019-11-26 LAB — RESPIRATORY PANEL BY RT PCR (FLU A&B, COVID)
Influenza A by PCR: NEGATIVE
Influenza B by PCR: NEGATIVE
SARS Coronavirus 2 by RT PCR: POSITIVE — AB

## 2019-11-26 LAB — ACETAMINOPHEN LEVEL: Acetaminophen (Tylenol), Serum: <10 ug/mL — ABNORMAL LOW (ref 10–30)

## 2019-11-26 LAB — ETHANOL: Alcohol, Ethyl (B): <10 mg/dL (ref <10)

## 2019-11-26 LAB — SALICYLATE LEVEL: Salicylate Lvl: <7 mg/dL — ABNORMAL LOW (ref 7.0–30.0)

## 2019-11-26 MED ORDER — NEVIRAPINE 200 MG PO TABS
200.0000 mg | ORAL_TABLET | Freq: Two times a day (BID) | ORAL | Status: DC
  Administered 2019-11-27 (×2): 200 mg via ORAL
  Filled 2019-11-26 (×5): qty 1

## 2019-11-26 MED ORDER — QUETIAPINE FUMARATE 100 MG PO TABS: 100.0000 mg | ORAL_TABLET | Freq: Every day | ORAL | Status: DC

## 2019-11-26 MED ORDER — LORAZEPAM 1 MG PO TABS
1.0000 mg | ORAL_TABLET | Freq: Once | ORAL | Status: AC
  Administered 2019-11-26: 1 mg via ORAL
  Filled 2019-11-26: qty 1

## 2019-11-26 MED ORDER — QUETIAPINE FUMARATE 100 MG PO TABS
100.0000 mg | ORAL_TABLET | Freq: Every day | ORAL | Status: DC
  Administered 2019-11-26 – 2019-11-27 (×2): 100 mg via ORAL
  Filled 2019-11-26 (×2): qty 1

## 2019-11-26 MED ORDER — EMTRICITABINE-TENOFOVIR AF 200-25 MG PO TABS
1.0000 | ORAL_TABLET | Freq: Every day | ORAL | Status: DC
  Administered 2019-11-27: 1 via ORAL
  Filled 2019-11-26 (×3): qty 1

## 2019-11-26 MED ORDER — NEVIRAPINE 200 MG PO TABS
ORAL_TABLET | Freq: Every day | ORAL | Status: DC
  Filled 2019-11-26: qty 2

## 2019-11-26 MED ORDER — SODIUM CHLORIDE 0.9 % IV BOLUS: 1000.0000 mL | Freq: Once | INTRAVENOUS | Status: DC

## 2019-11-26 MED ORDER — ESCITALOPRAM OXALATE 10 MG PO TABS
20.0000 mg | ORAL_TABLET | Freq: Every day | ORAL | Status: DC
  Administered 2019-11-26 – 2019-11-27 (×2): 20 mg via ORAL
  Filled 2019-11-26 (×2): qty 2

## 2019-11-26 NOTE — ED Notes (Signed)
Patient resting with eyes closed. Equal chest rise and fall noted. 

## 2019-11-26 NOTE — ED Notes (Signed)
Pt provided ginger ale to drink.  

## 2019-11-26 NOTE — ED Notes (Signed)
Patient given meal tray.

## 2019-11-26 NOTE — BH Assessment (Signed)
[facility] Assessment Progress Note    Case was staffed with Maisie Fus NP who recommends a inpatient admission to assist with stabilization. Patient was sent to [facility] for medical clearance due to overdose that occurred prior to arrival.
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
## 2019-11-26 NOTE — ED Notes (Signed)
[facility] reports they are accepting the patient once he is medically cleared.

## 2019-11-26 NOTE — ED Notes (Signed)
Pt has 2 bags of belongings and has been seen and wanded by security.

## 2019-11-26 NOTE — ED Triage Notes (Signed)
Per sign language interpreter at bedside, patient reports SI x3 weeks. States he lost his job and insurance and has been out of medications x2 weeks. Reports hx depression. States he took approximately thirty OTC sleeping pills yesterday in attempt to harm himself. Also reports visual hallucinations.
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
## 2019-11-27 ENCOUNTER — Encounter (HOSPITAL_COMMUNITY): Payer: Self-pay | Admitting: Registered Nurse

## 2019-11-27 MED ORDER — NEVIRAPINE 200 MG PO TABS: 200.0000 mg | ORAL_TABLET | Freq: Two times a day (BID) | ORAL | 0 refills | Status: DC

## 2019-11-27 MED ORDER — HYDROXYZINE HCL 25 MG PO TABS
50.0000 mg | ORAL_TABLET | Freq: Once | ORAL | Status: AC
  Administered 2019-11-27: 50 mg via ORAL
  Filled 2019-11-27: qty 2

## 2019-11-27 MED ORDER — EMTRICITABINE-TENOFOVIR AF 200-25 MG PO TABS: 1.0000 | ORAL_TABLET | Freq: Every day | ORAL | 0 refills | Status: DC

## 2019-11-27 NOTE — Discharge Summary (Signed)
  Tyler Horn, 59 y.o., male patient seen via tele psych by this provider, Dr. Lucianne Muss; and chart reviewed on 11/27/19.  On evaluation Killian Schwer reporting worsening depression and unable to receive services related to outpatient provider not having an  Interpreter for sign language.  States depression worsened and then suicidal thoughts.    During evaluation Cayle Thunder is alert/oriented x 4; calm/cooperative; and mood is congruent with affect.  He does not appear to be responding to internal/external stimuli or delusional thoughts.  Patient denies homicidal ideation, psychosis, and paranoia.  Patient answered question appropriately.          Disposition: Recommend psychiatric Inpatient admission when medically cleared.    Patient has been accepted to [facility].  Patient is to be transferred to [facility] for inpatient psychiatric treatment

## 2019-11-27 NOTE — Progress Notes (Addendum)
Received Tyler Horn at the change of shift  asleep in his bed with the sitter at the bedside. Report was called to nurse Tyler Horn at [facility]. Tyler Horn was notified to transport and arrived at 0550 hrs. He was transported with incident with his personal belongings at 0700 hrs.

## 2019-11-27 NOTE — BH Assessment (Signed)
BHH Assessment Progress Note  Per Shuvon Rankin, FNP, this voluntary pt requires psychiatric hospitalization at this time.  The following facilities have been contacted to seek placement for this pt, with results as noted:  Beds available, information sent, decision pending: [facility]  Unable to reach: [facility] (left message at 15:12) Earlene Plater (left message at 15:47)   At capacity: [facility] Doran Heater, [place] Behavioral Health Coordinator [phone]

## 2019-11-27 NOTE — ED Notes (Signed)
Patient is complaining of extreme anxiety.  States that the vistaril did not help.

## 2019-11-27 NOTE — ED Provider Notes (Signed)
Patient has prior Covid test in February.  Spoke with infection control and patient is cleared from their standpoint.   Lorre Nick, MD 11/27/19 [phone]

## 2019-11-28 ENCOUNTER — Inpatient Hospital Stay
Admission: EM | Admit: 2019-11-28 | Discharge: 2019-12-03 | DRG: 885 | Disposition: A | Discharge status: Home / Self Care | Payer: Self-pay | Source: Intra-hospital | Attending: Psychiatry | Admitting: Psychiatry

## 2019-11-28 ENCOUNTER — Encounter: Payer: Self-pay | Admitting: Registered Nurse

## 2019-11-28 ENCOUNTER — Other Ambulatory Visit: Payer: Self-pay

## 2019-11-28 DIAGNOSIS — T43226A Underdosing of selective serotonin reuptake inhibitors, initial encounter: Secondary | ICD-10-CM | POA: Diagnosis present

## 2019-11-28 DIAGNOSIS — F322 Major depressive disorder, single episode, severe without psychotic features: Secondary | ICD-10-CM

## 2019-11-28 DIAGNOSIS — T43596A Underdosing of other antipsychotics and neuroleptics, initial encounter: Secondary | ICD-10-CM | POA: Diagnosis present

## 2019-11-28 DIAGNOSIS — Z818 Family history of other mental and behavioral disorders: Secondary | ICD-10-CM

## 2019-11-28 DIAGNOSIS — Z91410 Personal history of adult physical and sexual abuse: Secondary | ICD-10-CM

## 2019-11-28 DIAGNOSIS — G4700 Insomnia, unspecified: Secondary | ICD-10-CM | POA: Diagnosis present

## 2019-11-28 DIAGNOSIS — R109 Unspecified abdominal pain: Secondary | ICD-10-CM | POA: Diagnosis not present

## 2019-11-28 DIAGNOSIS — F333 Major depressive disorder, recurrent, severe with psychotic symptoms: Principal | ICD-10-CM | POA: Diagnosis present

## 2019-11-28 DIAGNOSIS — Z79899 Other long term (current) drug therapy: Secondary | ICD-10-CM

## 2019-11-28 DIAGNOSIS — H905 Unspecified sensorineural hearing loss: Secondary | ICD-10-CM | POA: Diagnosis present

## 2019-11-28 DIAGNOSIS — Z8616 Personal history of COVID-19: Secondary | ICD-10-CM

## 2019-11-28 DIAGNOSIS — R1110 Vomiting, unspecified: Secondary | ICD-10-CM | POA: Diagnosis not present

## 2019-11-28 DIAGNOSIS — Z91138 Patient's unintentional underdosing of medication regimen for other reason: Secondary | ICD-10-CM

## 2019-11-28 DIAGNOSIS — Z560 Unemployment, unspecified: Secondary | ICD-10-CM

## 2019-11-28 DIAGNOSIS — F4310 Post-traumatic stress disorder, unspecified: Secondary | ICD-10-CM | POA: Diagnosis present

## 2019-11-28 DIAGNOSIS — B20 Human immunodeficiency virus [HIV] disease: Secondary | ICD-10-CM | POA: Diagnosis present

## 2019-11-28 DIAGNOSIS — R45851 Suicidal ideations: Secondary | ICD-10-CM | POA: Diagnosis present

## 2019-11-28 DIAGNOSIS — Z21 Asymptomatic human immunodeficiency virus [HIV] infection status: Secondary | ICD-10-CM | POA: Diagnosis present

## 2019-11-28 LAB — LIPID PANEL
Cholesterol: 171 mg/dL (ref 0–200)
HDL: 30 mg/dL — ABNORMAL LOW (ref >40)
LDL Cholesterol: 123 mg/dL — ABNORMAL HIGH (ref 0–99)
Total CHOL/HDL Ratio: 5.7 RATIO
Triglycerides: 89 mg/dL (ref <150)
VLDL: 18 mg/dL (ref 0–40)

## 2019-11-28 LAB — TSH: TSH: 0.803 u[IU]/mL (ref 0.350–4.500)

## 2019-11-28 MED ORDER — ALUM & MAG HYDROXIDE-SIMETH 200-200-20 MG/5ML PO SUSP
30.0000 mL | ORAL | Status: DC | PRN
  Administered 2019-12-02 (×2): 30 mL via ORAL
  Filled 2019-11-28 (×2): qty 30

## 2019-11-28 MED ORDER — HYDROXYZINE HCL 25 MG PO TABS
25.0000 mg | ORAL_TABLET | Freq: Three times a day (TID) | ORAL | Status: DC | PRN
  Administered 2019-11-28: 25 mg via ORAL
  Filled 2019-11-28: qty 1

## 2019-11-28 MED ORDER — ACETAMINOPHEN 325 MG PO TABS: 650.0000 mg | ORAL_TABLET | Freq: Four times a day (QID) | ORAL | Status: DC | PRN

## 2019-11-28 MED ORDER — FLUOXETINE HCL 20 MG PO CAPS
20.0000 mg | ORAL_CAPSULE | Freq: Every day | ORAL | Status: DC
  Administered 2019-11-29 – 2019-12-03 (×5): 20 mg via ORAL
  Filled 2019-11-28 (×5): qty 1

## 2019-11-28 MED ORDER — ESCITALOPRAM OXALATE 10 MG PO TABS
20.0000 mg | ORAL_TABLET | Freq: Every day | ORAL | Status: DC
  Administered 2019-11-28: 20 mg via ORAL
  Filled 2019-11-28: qty 2

## 2019-11-28 MED ORDER — NEVIRAPINE 50 MG/5ML PO SUSP
200.0000 mg | Freq: Two times a day (BID) | ORAL | Status: DC
  Administered 2019-11-28 – 2019-12-03 (×9): 200 mg via ORAL
  Filled 2019-11-28 (×12): qty 20

## 2019-11-28 MED ORDER — EMTRICITABINE-TENOFOVIR AF 200-25 MG PO TABS: 1.0000 | ORAL_TABLET | Freq: Every day | ORAL | Status: DC

## 2019-11-28 MED ORDER — EMTRICITABINE-TENOFOVIR AF 200-25 MG PO TABS
1.0000 | ORAL_TABLET | Freq: Every day | ORAL | Status: DC
  Administered 2019-11-28 – 2019-12-03 (×6): 1 via ORAL
  Filled 2019-11-28 (×6): qty 1

## 2019-11-28 MED ORDER — CLONAZEPAM 0.5 MG PO TABS
0.5000 mg | ORAL_TABLET | Freq: Four times a day (QID) | ORAL | Status: DC | PRN
  Administered 2019-11-28 – 2019-12-03 (×10): 0.5 mg via ORAL
  Filled 2019-11-28 (×11): qty 1

## 2019-11-28 MED ORDER — QUETIAPINE FUMARATE 100 MG PO TABS: 100.0000 mg | ORAL_TABLET | Freq: Every day | ORAL | Status: DC

## 2019-11-28 MED ORDER — QUETIAPINE FUMARATE 200 MG PO TABS
200.0000 mg | ORAL_TABLET | Freq: Every day | ORAL | Status: DC
  Administered 2019-11-28 – 2019-12-02 (×5): 200 mg via ORAL
  Filled 2019-11-28 (×5): qty 1

## 2019-11-28 MED ORDER — MAGNESIUM HYDROXIDE 400 MG/5ML PO SUSP: 30.0000 mL | Freq: Every day | ORAL | Status: DC | PRN

## 2019-11-28 MED ORDER — TRAZODONE HCL 50 MG PO TABS
50.0000 mg | ORAL_TABLET | Freq: Every evening | ORAL | Status: DC | PRN
  Administered 2019-11-29 – 2019-12-01 (×2): 50 mg via ORAL
  Filled 2019-11-28 (×2): qty 1
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
## 2019-11-28 NOTE — Progress Notes (Signed)
  Date: 11/28/2019  Time: 9:30 am   Location: Craft room   Behavioral response: N/A   Intervention Topic: Problem-Solving   Discussion/Intervention: Patient did not attend group.   Clinical Observations/Feedback:  Patient did not attend group.   Darrly Loberg LRT/CTRS
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
## 2019-11-28 NOTE — BHH Suicide Risk Assessment (Signed)
[facility] Admission Suicide Risk Assessment   Nursing information obtained from:    Demographic factors:    Current Mental Status:    Loss Factors:    Historical Factors:    Risk Reduction Factors:     Total Time spent with patient: 1 hour Principal Problem: MDD (major depressive disorder), severe ([place]) Diagnosis:  Principal Problem:   MDD (major depressive disorder), severe ([place]) Active Problems:   HIV (human immunodeficiency virus infection) ([place])   PTSD (post-traumatic stress disorder)  Subjective Data: Patient seen chart reviewed.  59 year old man with a history of recurrent depression reports that he recently took an overdose of over-the-counter sleeping pills with suicidal intent.  Continues to report multiple symptoms of depression and suicidal ideation.  Currently cooperative with treatment denying any intent to harm himself in the hospital.  Continued Clinical Symptoms:  Alcohol Use Disorder Identification Test Final Score (AUDIT): 0 The "Alcohol Use Disorders Identification Test", Guidelines for Use in Primary Care, Second Edition.  World Pharmacologist [facility]). Score between 0-7:  no or low risk or alcohol related problems. Score between 8-15:  moderate risk of alcohol related problems. Score between 16-19:  high risk of alcohol related problems. Score 20 or above:  warrants further diagnostic evaluation for alcohol dependence and treatment.   CLINICAL FACTORS:   Depression:   Anhedonia Impulsivity   Musculoskeletal: Strength & Muscle Tone: within normal limits Gait & Station: normal Patient leans: N/A  Psychiatric Specialty Exam: Physical Exam  Nursing note and vitals reviewed. Constitutional: He appears well-developed and well-nourished.  HENT:  Head: Normocephalic and atraumatic.  Eyes: Pupils are equal, round, and reactive to light. Conjunctivae are normal.  Cardiovascular: Regular rhythm and normal heart sounds.  Respiratory: Effort normal. No respiratory  distress.  GI: Soft.  Musculoskeletal:        General: Normal range of motion.     Cervical back: Normal range of motion.  Neurological: He is alert.  Skin: Skin is warm and dry.  Psychiatric: His affect is blunt. His speech is delayed. He is slowed and withdrawn. Cognition and memory are impaired. He expresses impulsivity. He exhibits a depressed mood. He expresses suicidal ideation. He expresses no homicidal ideation. He expresses no suicidal plans.    Review of Systems  Constitutional: Negative.   HENT: Negative.   Eyes: Negative.   Respiratory: Negative.   Cardiovascular: Negative.   Gastrointestinal: Negative.   Musculoskeletal: Negative.   Skin: Negative.   Neurological: Negative.   Psychiatric/Behavioral: Positive for dysphoric mood, hallucinations and suicidal ideas. The patient is nervous/anxious.     Blood pressure 121/80, pulse 88, temperature 98.4 F (36.9 C), temperature source Oral, resp. rate 18, height 6\' 4"  (1.93 m), weight 98.4 kg, SpO2 99 %.Body mass index is 26.41 kg/m.  General Appearance: Disheveled  Eye Contact:  Fair  Speech:  Normal Rate  Volume:  Normal  Mood:  Depressed  Affect:  Constricted and Depressed  Thought Process:  Coherent  Orientation:  Full (Time, Place, and Person)  Thought Content:  Logical  Suicidal Thoughts:  Yes.  without intent/plan  Homicidal Thoughts:  No  Memory:  Immediate;   Fair Recent;   Poor Remote;   Poor  Judgement:  Impaired  Insight:  Shallow  Psychomotor Activity:  Decreased  Concentration:  Concentration: Poor  Recall:  [employer] of Knowledge:  Fair  Language:  Fair  Akathisia:  No  Handed:  Right  AIMS (if indicated):     Assets:  Communication Skills  Desire for Improvement Housing Resilience Social Support  ADL's:  Impaired  Cognition:  Impaired,  Mild  Sleep:         COGNITIVE FEATURES THAT CONTRIBUTE TO RISK:  None    SUICIDE RISK:   Minimal: No identifiable suicidal ideation.  Patients  presenting with no risk factors but with morbid ruminations; may be classified as minimal risk based on the severity of the depressive symptoms  PLAN OF CARE: Continue 15-minute checks.  Engage in individual and group counseling.  Review medication plan and make alterations as needed.  Reassess dangerousness prior to discharge.  I certify that inpatient services furnished can reasonably be expected to improve the patient's condition.   Mordecai Rasmussen, MD 11/28/2019, 1:23 PM

## 2019-11-28 NOTE — BHH Group Notes (Signed)
Balance In Life 11/28/2019 1PM  Type of Therapy/Topic:  Group Therapy:  Balance in Life  Participation Level:  Did Not Attend  Description of Group:   This group will address the concept of balance and how it feels and looks when one is unbalanced. Patients will be encouraged to process areas in their lives that are out of balance and identify reasons for remaining unbalanced. Facilitators will guide patients in utilizing problem-solving interventions to address and correct the stressor making their life unbalanced. Understanding and applying boundaries will be explored and addressed for obtaining and maintaining a balanced life. Patients will be encouraged to explore ways to assertively make their unbalanced needs known to significant others in their lives, using other group members and facilitator for support and feedback.  Therapeutic Goals: 1. Patient will identify two or more emotions or situations they have that consume much of in their lives. 2. Patient will identify signs/triggers that life has become out of balance:  3. Patient will identify two ways to set boundaries in order to achieve balance in their lives:  4. Patient will demonstrate ability to communicate their needs through discussion and/or role plays  Summary of Patient Progress:    Therapeutic Modalities:   Cognitive Behavioral Therapy Solution-Focused Therapy Assertiveness Training  Dewane Timson T Kalena Mander, LCSW  

## 2019-11-28 NOTE — BHH Group Notes (Signed)
BHH Group Notes:  (Nursing/MHT/Case Management/Adjunct)  Date:  11/28/2019  Time:  8:49 PM  Type of Therapy:  Wrap-Up Group  Participation Level:  Active  Participation Quality:  Appropriate  Affect:  Appropriate  Cognitive:  Alert and Appropriate  Insight:  Appropriate and Good  Engagement in Group:  Engaged  Modes of Intervention:  Clarification  Summary of Progress/Problems:  Tyler Horn 11/28/2019, 8:49 PM

## 2019-11-28 NOTE — Progress Notes (Signed)
Patient came via stretcher with EMS from [facility].Patient is deaf,looks anxious and depressed. Skin assessment done,no contraband found.Breakfast given.Made patient comfortable in the bed.

## 2019-11-28 NOTE — Plan of Care (Signed)
Patient stated that his anxiety is little better, still have passive suicidal thoughts.Contracts for safety.Visible in the milieu.Watching TV.Compliant with medications.Appetite and energy level good.Support and encouragement given.

## 2019-11-28 NOTE — ED Provider Notes (Signed)
Nurse states patient has been accepted at [facility], states she texted me but I have not gotten any texts. She sent to the wrong Dr Lynelle Doctor.   Patient was sleeping, he was awakened.  He is deaf, I wrote out that he was being admitted to [facility] in [place] and he is agreeable.   Devoria Albe, MD 11/28/19 [phone]

## 2019-11-28 NOTE — Tx Team (Signed)
Initial Treatment Plan 11/28/2019 3:03 PM [name] DVO:[id number]    PATIENT STRESSORS: Financial difficulties Health problems   PATIENT STRENGTHS: Ability for insight Average or above average intelligence Work skills   PATIENT IDENTIFIED PROBLEMS: Depression  Suicidal ideation                   DISCHARGE CRITERIA:  Adequate post-discharge living arrangements Medical problems require only outpatient monitoring Verbal commitment to aftercare and medication compliance  PRELIMINARY DISCHARGE PLAN: Attend aftercare/continuing care group Return to previous living arrangement  PATIENT/FAMILY INVOLVEMENT: This treatment plan has been presented to and reviewed with the patient, Tyler Horn, and/or family member,.  The patient and family have been given the opportunity to ask questions and make suggestions.  Leonarda Salon, RN 11/28/2019, 3:03 PM

## 2019-11-29 ENCOUNTER — Encounter: Payer: Self-pay | Admitting: *Deleted

## 2019-11-29 ENCOUNTER — Telehealth: Payer: Self-pay | Admitting: Pharmacy Technician

## 2019-11-29 ENCOUNTER — Encounter: Payer: BLUE CROSS/BLUE SHIELD | Admitting: Family

## 2019-11-29 ENCOUNTER — Ambulatory Visit: Payer: BLUE CROSS/BLUE SHIELD | Admitting: Pharmacist

## 2019-11-29 MED ORDER — PRAZOSIN HCL 1 MG PO CAPS
1.0000 mg | ORAL_CAPSULE | Freq: Every day | ORAL | Status: DC
  Administered 2019-11-29 – 2019-12-02 (×4): 1 mg via ORAL
  Filled 2019-11-29 (×4): qty 1

## 2019-11-29 NOTE — Progress Notes (Addendum)
Recreation Therapy Notes  Date: 11/29/2019  Time: 9:30 am  Location: Room 21   Behavioral response: Appropriate   Intervention Topic: Animal Assisted Therapy   Discussion/Intervention:  Animal Assisted Therapy took place today during group.  Animal Assisted Therapy is the planned inclusion of an animal in a patient's treatment plan. The patients were able to engage in therapy with an animal during group. Participants were educated on what a service dog is and the different between a support dog and a service dog. Patient were informed on the many animal needs there are and how their needs are similar. Individuals were enlightened on the process to get a service animal or support animal. Patients got the opportunity to pet the animal and were offered emotional support from the animal and staff.  Clinical Observations/Feedback:  Patient came to group and was on topic and was focused on what peers and staff had to say. Participant shared their experiences and history with animals. Individual was social with peers, staff and animal while participating in group.   Interpreter#: Steward Drone [id number] Amy [id number]  Chapman Matteucci LRT/CTRS         Tao Satz 11/29/2019 11:26 AM

## 2019-11-29 NOTE — Progress Notes (Signed)
CSW spoke with Morrie Sheldon, pts case manager who reported that her non profit agency will pay $400 of pts rent and she could reach out to community resources and agencies like churches to see if she can get the remainder of his rent covered, but would need consent from pt to do so. CSW inquired about pts follow up and Morrie Sheldon reported that she works with setting up pts HIV follow up, but did refer him to [facility] of the [place] but pt got frustrated because there was no interpreter present for him. Morrie Sheldon reported that [facility] is another option for pt but she believes that [employer] of the [place] will be a better option. CSW reported that she will speak with pt and ask if he gives permission for her to speak with community agencies to secure his rent. CSW reported she would call Morrie Sheldon when pt and interpreter are present.    CSW spoke with pt to inform him of follow up options. Pt reported he prefers to go to [place] and reported they are good with translation services and he is familiar from when he worked with [employer]. CSW called Morrie Sheldon and pt agreed for her to contact his apartment complex and speak with Herbert Seta regarding his rent payment and to inform her that pt is in the hospital. Morrie Sheldon reported she spoke with the [facility] and let them know pt was in the hospital and they will have to contact them when pt is discharged to reschedule his appointment. Pt requested CSW team to keep Morrie Sheldon informed on his discharge date so she can get him set up with volunteer opportunities and other things in the community.   Iris Pert, MSW, LCSW Clinical Social Work 11/29/2019 2:26 PM

## 2019-11-29 NOTE — Tx Team (Addendum)
Interdisciplinary Treatment and Diagnostic Plan Update  11/29/2019 Time of Session: 900am Tyler Horn MRN: [id number]  Principal Diagnosis: MDD (major depressive disorder), severe ([place])  Secondary Diagnoses: Principal Problem:   MDD (major depressive disorder), severe ([place]) Active Problems:   HIV (human immunodeficiency virus infection) ([place])   PTSD (post-traumatic stress disorder)   Current Medications:  Current Facility-Administered Medications  Medication Dose Route Frequency Provider Last Rate Last Admin  . acetaminophen (TYLENOL) tablet 650 mg  650 mg Oral Q6H PRN Money, Lowry Ram, FNP      . alum & mag hydroxide-simeth (MAALOX/MYLANTA) 200-200-20 MG/5ML suspension 30 mL  30 mL Oral Q4H PRN Money, Lowry Ram, FNP      . clonazePAM (KLONOPIN) tablet 0.5 mg  0.5 mg Oral Q6H PRN Clapacs, Madie Reno, MD   0.5 mg at 11/29/19 4401  . emtricitabine-tenofovir AF (DESCOVY) 200-25 MG per tablet 1 tablet  1 tablet Oral Daily Money, Lowry Ram, FNP   1 tablet at 11/29/19 0810  . FLUoxetine (PROZAC) capsule 20 mg  20 mg Oral Daily Clapacs, John T, MD   20 mg at 11/29/19 0810  . hydrOXYzine (ATARAX/VISTARIL) tablet 25 mg  25 mg Oral TID PRN Money, Lowry Ram, FNP   25 mg at 11/28/19 0826  . magnesium hydroxide (MILK OF MAGNESIA) suspension 30 mL  30 mL Oral Daily PRN Money, Lowry Ram, FNP      . nevirapine [employer]) 50 MG/5ML suspension 200 mg  200 mg Oral Q12H Money, Lowry Ram, FNP   200 mg at 11/29/19 0810  . QUEtiapine (SEROQUEL) tablet 200 mg  200 mg Oral QHS Clapacs, John T, MD   200 mg at 11/28/19 2057  . traZODone (DESYREL) tablet 50 mg  50 mg Oral QHS PRN Money, Lowry Ram, FNP       PTA Medications: Medications Prior to Admission  Medication Sig Dispense Refill Last Dose  . emtricitabine-tenofovir AF (DESCOVY) 200-25 MG tablet Take 1 tablet by mouth daily. 30 tablet 0   . escitalopram (LEXAPRO) 20 MG tablet Take 1 tablet (20 mg total) daily by mouth. 30 tablet 0   . nevirapine (VIRAMUNE) 200 MG  tablet Take 1 tablet (200 mg total) by mouth every 12 (twelve) hours. 60 tablet 0   . QUEtiapine (SEROQUEL) 100 MG tablet Take 1 tablet (100 mg total) at bedtime by mouth. 30 tablet 0     Patient Stressors: Financial difficulties Health problems  Patient Strengths: Ability for insight Average or above average intelligence Work skills  Treatment Modalities: Medication Management, Group therapy, Case management,  1 to 1 session with clinician, Psychoeducation, Recreational therapy.   Physician Treatment Plan for Primary Diagnosis: MDD (major depressive disorder), severe ([place]) Long Term Goal(s): Improvement in symptoms so as ready for discharge Improvement in symptoms so as ready for discharge   Short Term Goals: Ability to verbalize feelings will improve Ability to disclose and discuss suicidal ideas Ability to maintain clinical measurements within normal limits will improve Compliance with prescribed medications will improve  Medication Management: Evaluate patient's response, side effects, and tolerance of medication regimen.  Therapeutic Interventions: 1 to 1 sessions, Unit Group sessions and Medication administration.  Evaluation of Outcomes: Progressing  Physician Treatment Plan for Secondary Diagnosis: Principal Problem:   MDD (major depressive disorder), severe ([place]) Active Problems:   HIV (human immunodeficiency virus infection) ([place])   PTSD (post-traumatic stress disorder)  Long Term Goal(s): Improvement in symptoms so as ready for discharge Improvement in symptoms so as ready for discharge  Short Term Goals: Ability to verbalize feelings will improve Ability to disclose and discuss suicidal ideas Ability to maintain clinical measurements within normal limits will improve Compliance with prescribed medications will improve     Medication Management: Evaluate patient's response, side effects, and tolerance of medication regimen.  Therapeutic Interventions: 1 to 1  sessions, Unit Group sessions and Medication administration.  Evaluation of Outcomes: Progressing   RN Treatment Plan for Primary Diagnosis: MDD (major depressive disorder), severe ([facility]) Long Term Goal(s): Knowledge of disease and therapeutic regimen to maintain health will improve  Short Term Goals: Ability to remain free from injury will improve, Ability to participate in decision making will improve, Ability to verbalize feelings will improve, Ability to disclose and discuss suicidal ideas, Ability to identify and develop effective coping behaviors will improve and Compliance with prescribed medications will improve  Medication Management: RN will administer medications as ordered by provider, will assess and evaluate patient's response and provide education to patient for prescribed medication. RN will report any adverse and/or side effects to prescribing provider.  Therapeutic Interventions: 1 on 1 counseling sessions, Psychoeducation, Medication administration, Evaluate responses to treatment, Monitor vital signs and CBGs as ordered, Perform/monitor CIWA, COWS, AIMS and Fall Risk screenings as ordered, Perform wound care treatments as ordered.  Evaluation of Outcomes: Progressing   LCSW Treatment Plan for Primary Diagnosis: MDD (major depressive disorder), severe (H[facility]) Long Term Goal(s): Safe transition to appropriate next level of care at discharge, Engage patient in therapeutic group addressing interpersonal concerns.  Short Term Goals: Engage patient in aftercare planning with referrals and resources, Increase emotional regulation and Increase skills for wellness and recovery  Therapeutic Interventions: Assess for all discharge needs, 1 to 1 time with Social worker, Explore available resources and support systems, Assess for adequacy in community support network, Educate family and significant other(s) on suicide prevention, Complete Psychosocial Assessment, Interpersonal group  therapy.  Evaluation of Outcomes: Progressing   Progress in Treatment: Attending groups: Yes. Participating in groups: No. Taking medication as prescribed: Yes. Toleration medication: Yes. Family/Significant other contact made: No, will contact:  pts mother and case manager Patient understands diagnosis: Yes. Discussing patient identified problems/goals with staff: Yes. Medical problems stabilized or resolved: Yes. Denies suicidal/homicidal ideation: No. Issues/concerns per patient self-inventory: No. Other: N/A  New problem(s) identified: No, Describe:  none  New Short Term/Long Term Goal(s): Detox, elimination of AVH/symptoms of psychosis, medication management for mood stabilization; elimination of SI thoughts; development of comprehensive mental wellness/sobriety plan.   Patient Goals:  "I have to get back to a normal life"  Discharge Plan or Barriers: SPE pamphlet, Mobile Crisis information, and AA/NA information provided to patient for additional community support and resources. CSW assessing for appropriate referrals.  Reason for Continuation of Hospitalization: Anxiety Depression Medication stabilization  Estimated Length of Stay: 5-7 days  Recreational Therapy: Patient Stressors: N/A Patient Goal: Patient will engage in groups without prompting or encouragement from LRT x3 group sessions within 5 recreation therapy group sessions  Attendees: Patient: Tyler Horn  11/29/2019 10:29 AM  Physician: Dr Toni Amend MD 11/29/2019 10:29 AM  Nursing: Torrie Mayers RN 11/29/2019 10:29 AM  RN Care Manager: 11/29/2019 10:29 AM  Social Worker: Zollie Scale Moton LCSW 11/29/2019 10:29 AM  Recreational Therapist: Garret Reddish CTRS LRT 11/29/2019 10:29 AM  Other: Penni Homans LCSW  11/29/2019 10:29 AM  Other:  11/29/2019 10:29 AM  Other: 11/29/2019 10:29 AM    Scribe for Treatment Team: Charlann Lange Moton, LCSW 11/29/2019 10:29 AM

## 2019-11-29 NOTE — Plan of Care (Signed)
Patient participating in treatment.   Problem: Education: Goal: Mental status will improve Outcome: Progressing   Problem: Activity: Goal: Imbalance in normal sleep/wake cycle will improve Outcome: Progressing   Problem: Coping: Goal: Coping ability will improve Outcome: Progressing

## 2019-11-29 NOTE — [facility] Suicide Risk Assessment (Signed)
[facility] INPATIENT:  Family/Significant Other Suicide Prevention Education  Suicide Prevention Education:  Education Completed; Tyler Horn, mother [phone], has been identified by the patient as the family member/significant other with whom the patient will be residing, and identified as the person(s) who will aid the patient in the event of a mental health crisis (suicidal ideations/suicide attempt).  With written consent from the patient, the family member/significant other has been provided the following suicide prevention education, prior to the and/or following the discharge of the patient.  The suicide prevention education provided includes the following:  Suicide risk factors  Suicide prevention and interventions  National Suicide Hotline telephone number  [facility] assessment telephone number  [facility] Emergency Assistance 911  [facility] and/or Residential Mobile Crisis Unit telephone number  Request made of family/significant other to:  Remove weapons (e.g., guns, rifles, knives), all items previously/currently identified as safety concern.    Remove drugs/medications (over-the-counter, prescriptions, illicit drugs), all items previously/currently identified as a safety concern.  The family member/significant other verbalizes understanding of the suicide prevention education information provided.  The family member/significant other agrees to remove the items of safety concern listed above.  CSW spoke with patient's mother. Mother reports that patient has declined since losing his job.  Mother reports that "the day he lost his job he started putting out messages that were 'end of life'".  Mother reports that the patient's last communication with her was on the 29th of March.  She reports that patient told her that "he's tired, confused weal and took a bunch of sleeping pills".    Tyler Horn 11/29/2019, 2:12 PM

## 2019-11-29 NOTE — Plan of Care (Signed)
  Problem: Education: Goal: Knowledge of [facility] Education information/materials will improve Outcome: Progressing Goal: Emotional status will improve Outcome: Progressing Goal: Mental status will improve Outcome: Progressing Goal: Verbalization of understanding the information provided will improve Outcome: Progressing  D: Patient out in the dayroom with other patients. Communicating with staff via pen and paper. Pleasant and cooperative. Denies SI HI and AVH.  A; continue to monitor for safety R: safety maintained.

## 2019-11-29 NOTE — [facility] Suicide Risk Assessment (Signed)
[facility] INPATIENT:  Family/Significant Other Suicide Prevention Education  Suicide Prevention Education:  Contact Attempts:  [name], mother [phone] has been identified by the patient as the family member/significant other with whom the patient will be residing, and identified as the person(s) who will aid the patient in the event of a mental health crisis.  With written consent from the patient, two attempts were made to provide suicide prevention education, prior to and/or following the patient's discharge.  We were unsuccessful in providing suicide prevention education.  A suicide education pamphlet was given to the patient to share with family/significant other.  Date and time of first attempt: 11/29/19 at 10:44am Date and time of second attempt:   CSW left a HIPAA compliant voicemail requesting a call back.   Charlann Lange Mikinzie Maciejewski MSW LCSW 11/29/2019, 10:44 AM

## 2019-11-29 NOTE — [facility] Group Notes (Signed)
[facility] Group Notes:  (Nursing/MHT/Case Management/Adjunct)  Date:  11/29/2019  Time:  10:12 PM  Type of Therapy:  Group Therapy  Participation Level:  Did Not Attend    Landry Mellow 11/29/2019, 10:12 PM

## 2019-11-29 NOTE — Progress Notes (Addendum)
Recreation Therapy Notes  INPATIENT RECREATION THERAPY ASSESSMENT  Patient Details Name: Tyler Horn MRN: [id number] DOB: 11/05/1960 Today's Date: 11/29/2019   Interpreter: Elon Jester [id number]       Information Obtained From: Patient  Able to Participate in Assessment/Interview: Yes  Patient Presentation: Responsive  Reason for Admission (Per Patient): Active Symptoms, Suicidal Ideation  Patient Stressors: Work  Pharmacologist:   Other (Comment)(Nothing)  Leisure Interests (2+):  Individual - Reading  Frequency of Recreation/Participation:    Awareness of Community Resources:     [employer]:     Current Use:    If no, Barriers?:    Expressed Interest in [employer] Information:    [place] of Residence:  [place]  Patient Main Form of Transportation: Set designer  Patient Strengths:  easy going, caring, helpful  Patient Identified Areas of Improvement:  I do not know  Patient Goal for Hospitalization:  Stability  Current SI (including self-harm):  Yes(No plan)  Current HI:  No  Current AVH: Yes  Staff Intervention Plan: Group Attendance, Collaborate with Interdisciplinary Treatment Team  Consent to Intern Participation: N/A  Oleva Koo 11/29/2019, 1:17 PM

## 2019-11-29 NOTE — Telephone Encounter (Signed)
[facility] Patient Advocate Encounter    Findings of the benefits investigation:   Insurance: uninsured (umap app started, RW approved)  [facility] Patient Advocate will follow up once patient arrives for their appointment and confirm this status and apply for patient assistance.  Beulah Gandy, CPhT Specialty Pharmacy Patient [facility] Phone: [phone] Fax: [phone] 11/29/2019 9:45 AM

## 2019-11-29 NOTE — [facility] Group Notes (Signed)
  LCSW Group Therapy Note  11/29/2019 2:26 PM   Type of Therapy/Topic:  Group Therapy:  Feelings about Diagnosis  Participation Level:  Active   Description of Group:   This group will allow patients to explore their thoughts and feelings about diagnoses they have received. Patients will be guided to explore their level of understanding and acceptance of these diagnoses. Facilitator will encourage patients to process their thoughts and feelings about the reactions of others to their diagnosis and will guide patients in identifying ways to discuss their diagnosis with significant others in their lives. This group will be process-oriented, with patients participating in exploration of their own experiences, giving and receiving support, and processing challenge from other group members.   Therapeutic Goals: 1. Patient will demonstrate understanding of diagnosis as evidenced by identifying two or more symptoms of the disorder 2. Patient will be able to express two feelings regarding the diagnosis 3. Patient will demonstrate their ability to communicate their needs through discussion and/or role play  Summary of Patient Progress: Pt was present in group. Interpreter machine was used, [employer] [phone]. Pt was very active in group and was able to identify many different diagnosis and explain to the group what they meant. Pt reported that his father is diagnosed with OCD and that he himself has anxiety.   Therapeutic Modalities:   Cognitive Behavioral Therapy Brief Therapy Feelings Identification    Iris Pert, MSW, LCSW Clinical Social Work 11/29/2019 2:26 PM

## 2019-11-29 NOTE — Progress Notes (Signed)
F - Mood stabilization, anxiety management, and decrease in hallucinations.    D - Patient endorsing heightened levels of anxiety, depression, and hopelessness.  Denied suicidal thoughts while in the hospital.  Appears anxious and worried.  Tyler Horn was visible in the milieu and participated in groups.  Medications accepted as ordered.  No panic attacks or severe emotional lability observed. Tyler Horn's mood was depressed and his affect appeared flat.  The patient napped after breakfast and adequate food/fluids consumed.    A - Therapeutic interaction, medications, and support provided.  Recent changes in mediations accepted without issue.  [employer] used to translate when paper/pencil was not adequate.  Safety checks maintained.     R - Patient acclimating to the unit.  Socially appropriate and pleasant upon contact.

## 2019-11-29 NOTE — Progress Notes (Signed)
CSW lvm for Morrie Sheldon, pts case manager requesting a call back.   Iris Pert, MSW, LCSW Clinical Social Work 11/29/2019 10:38 AM

## 2019-11-29 NOTE — Progress Notes (Signed)
[facility] MD Progress Note  11/29/2019 1:29 PM Tyler Horn  MRN:  [id number]   Subjective: Follow-up for this 59 year old male diagnosed with MDD severe.  ASL interpreter was used for evaluation due to patient using sign language for communication and is deaf.  Patient reports that he has felt a little bit better today.  He states that the medications seem to be doing okay and does not notice any side effects to them.  Reports that he felt like he slept a little bit better, but he has continued to have bad nightmares.  He states that he has had nightmares for years due to his trauma.  He states that he would like to see if the Seroquel could be increased to get rid of his nightmares.  He reports that his appetite is getting better and that his mood is a little more improved.  He denies having any suicidal or homicidal ideations and denies any hallucinations.  He does report that in the mornings when he first wakes up that his mood is usually the worst and he feels a little paranoid because of his nightmares.  He states that it if he can get the nightmares to go away it would make his days go a lot better and improve his mood greatly.  Principal Problem: MDD (major depressive disorder), severe ([place]) Diagnosis: Principal Problem:   MDD (major depressive disorder), severe (HCC) Active Problems:   HIV (human immunodeficiency virus infection) ([place])   PTSD (post-traumatic stress disorder)  Total Time spent with patient: 30 minutes  Past Psychiatric History: Multiple prior hospitalizations as well as visits to the emergency room.  He was just at the emergency room in [place] last month and at that time was transferred to [facility] because he was positive for COVID.  He says that was a very short hospitalization.  He says that he does have a therapist and he has peers support provided by a deafness support group but does not actually have a psychiatrist.  He says the only medicines he remembers  being prescribed were Lexapro and Seroquel.  He has been on the Lexapro for years.  He claims that having now lost his insurance he feels like he can no longer afford his medicine.  Patient is HIV positive by the way although he says that the disease is undetectable and that he keeps up with his medicine.  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  Past Medical History:  Past Medical History:  Diagnosis Date  . Anxiety   . Depression   . HIV (human immunodeficiency virus infection) ([place])   . HIV (human immunodeficiency virus infection) ([place])    History reviewed. No pertinent surgical history. Family History:  Family History  Problem Relation Age of Onset  . Mental illness Mother   . Bipolar disorder Brother    Family Psychiatric  History: None reported Social History:  Social History   Substance and Sexual Activity  Alcohol Use No     Social History   Substance and Sexual Activity  Drug Use No    Social History   Socioeconomic History  . Marital status: Single    Spouse name: Not on file  . Number of children: Not on file  . Years of education: Not on file  . Highest education level: Not on file  Occupational History  . Not on file  Tobacco Use  . Smoking status: Never Smoker  . Smokeless tobacco: Never Used  Substance and Sexual Activity  . Alcohol use: No  . Drug use: No  . Sexual activity: Yes  Other Topics Concern  . Not on file  Social History Narrative  . Not on file   Social Determinants of Health   Financial Resource Strain:   . Difficulty of Paying Living Expenses:   Food Insecurity:   . Worried About Programme researcher, broadcasting/film/video in the Last Year:   . Barista in the Last Year:   Transportation Needs:   . Freight forwarder (Medical):   Marland Kitchen Lack of Transportation (Non-Medical):   Physical Activity:   . Days of Exercise per Week:   . Minutes of Exercise per Session:   Stress:   . Feeling of Stress :   Social  Connections:   . Frequency of Communication with Friends and Family:   . Frequency of Social Gatherings with Friends and Family:   . Attends Religious Services:   . Active Member of Clubs or Organizations:   . Attends Banker Meetings:   Marland Kitchen Marital Status:    Additional Social History:                         Sleep: Fair  Appetite:  Fair  Current Medications: Current Facility-Administered Medications  Medication Dose Route Frequency Provider Last Rate Last Admin  . acetaminophen (TYLENOL) tablet 650 mg  650 mg Oral Q6H PRN Eldredge Veldhuizen, Gerlene Burdock, FNP      . alum & mag hydroxide-simeth (MAALOX/MYLANTA) 200-200-20 MG/5ML suspension 30 mL  30 mL Oral Q4H PRN Rayssa Atha, Gerlene Burdock, FNP      . clonazePAM (KLONOPIN) tablet 0.5 mg  0.5 mg Oral Q6H PRN Clapacs, Jackquline Denmark, MD   0.5 mg at 11/29/19 5784  . emtricitabine-tenofovir AF (DESCOVY) 200-25 MG per tablet 1 tablet  1 tablet Oral Daily Purcell Jungbluth, Gerlene Burdock, FNP   1 tablet at 11/29/19 0810  . FLUoxetine (PROZAC) capsule 20 mg  20 mg Oral Daily Clapacs, John T, MD   20 mg at 11/29/19 0810  . hydrOXYzine (ATARAX/VISTARIL) tablet 25 mg  25 mg Oral TID PRN Marrianne Sica, Gerlene Burdock, FNP   25 mg at 11/28/19 0826  . magnesium hydroxide (MILK OF MAGNESIA) suspension 30 mL  30 mL Oral Daily PRN Rudy Luhmann, Gerlene Burdock, FNP      . nevirapine [facility]) 50 MG/5ML suspension 200 mg  200 mg Oral Q12H Sibbie Flammia, Feliz Beam B, FNP   200 mg at 11/29/19 0810  . prazosin (MINIPRESS) capsule 1 mg  1 mg Oral QHS Jlyn Cerros, Gerlene Burdock, FNP      . QUEtiapine (SEROQUEL) tablet 200 mg  200 mg Oral QHS Clapacs, John T, MD   200 mg at 11/28/19 2057  . traZODone (DESYREL) tablet 50 mg  50 mg Oral QHS PRN Keshun Berrett, Gerlene Burdock, FNP        Lab Results:  Results for orders placed or performed during the hospital encounter of 11/28/19 (from the past 48 hour(s))  TSH     Status: None   Collection Time: 11/28/19  8:55 AM  Result Value Ref Range   TSH 0.803 0.350 - 4.500 uIU/mL    Comment: Performed by  a 3rd Generation assay with a functional sensitivity of <=0.01 uIU/mL. Performed at [facility], [address], [place], [place] [address]   Lipid panel     Status: Abnormal   Collection Time: 11/28/19  8:55 AM  Result Value Ref  Range   Cholesterol 171 0 - 200 mg/dL   Triglycerides 89 <902 mg/dL   HDL 30 (L) >40 mg/dL   Total CHOL/HDL Ratio 5.7 RATIO   VLDL 18 0 - 40 mg/dL   LDL Cholesterol 973 (H) 0 - 99 mg/dL    Comment:        Total Cholesterol/HDL:CHD Risk Coronary Heart Disease Risk Table                     Men   Women  1/2 Average Risk   3.4   3.3  Average Risk       5.0   4.4  2 X Average Risk   9.6   7.1  3 X Average Risk  23.4   11.0        Use the calculated Patient Ratio above and the CHD Risk Table to determine the patient's CHD Risk.        ATP III CLASSIFICATION (LDL):  <100     mg/dL   Optimal  532-992  mg/dL   Near or Above                    Optimal  130-159  mg/dL   Borderline  426-834  mg/dL   High  >196     mg/dL   Very High Performed at [facility], [address] [place], [place] [address]     Blood Alcohol level:  Lab Results  Component Value Date   [facility] <10 11/26/2019   ETH <10 10/26/2019    Metabolic Disorder Labs: Lab Results  Component Value Date   HGBA1C 5.1 07/15/2017   MPG 99.67 07/15/2017   No results found for: PROLACTIN Lab Results  Component Value Date   CHOL 171 11/28/2019   TRIG 89 11/28/2019   HDL 30 (L) 11/28/2019   CHOLHDL 5.7 11/28/2019   VLDL 18 11/28/2019   LDLCALC 123 (H) 11/28/2019   LDLCALC 179 (H) 11/13/2019    Physical Findings: AIMS:  , ,  ,  ,    CIWA:    COWS:     Musculoskeletal: Strength & Muscle Tone: within normal limits Gait & Station: normal Patient leans: N/A  Psychiatric Specialty Exam: Physical Exam  Nursing note and vitals reviewed. Constitutional: He is oriented to person, place, and time. He appears well-developed and well-nourished.  Cardiovascular: Normal  rate.  Respiratory: Effort normal.  Musculoskeletal:        General: Normal range of motion.  Neurological: He is alert and oriented to person, place, and time.  Skin: Skin is warm.  Psychiatric: His mood appears anxious. Thought content is paranoid. He exhibits a depressed mood.    Review of Systems  Constitutional: Negative.   HENT: Negative.   Eyes: Negative.   Respiratory: Negative.   Cardiovascular: Negative.   Gastrointestinal: Negative.   Genitourinary: Negative.   Musculoskeletal: Negative.   Skin: Negative.   Neurological: Negative.   Psychiatric/Behavioral: Positive for dysphoric mood.    Blood pressure 122/75, pulse 95, temperature 97.8 F (36.6 C), temperature source Oral, resp. rate 18, height 6\' 4"  (1.93 m), weight 98.4 kg, SpO2 97 %.Body mass index is 26.41 kg/m.  General Appearance: Casual  Eye Contact:  Good  Speech:  No speech, uses sign language  Volume:  No speech, uses sign language  Mood:  Depressed  Affect:  Congruent  Thought Process:  Coherent and Descriptions of Associations: Intact  Orientation:  Full (Time, Place, and  Person)  Thought Content:  WDL  Suicidal Thoughts:  No  Homicidal Thoughts:  No  Memory:  Immediate;   Fair Recent;   Fair Remote;   Fair  Judgement:  Fair  Insight:  Fair  Psychomotor Activity:  Normal  Concentration:  Concentration: Fair  Recall:  [employer] of Knowledge:  Fair  Language:  Fair  Akathisia:  No  Handed:  Right  AIMS (if indicated):     Assets:  Desire for Improvement Housing Social Support Transportation  ADL's:  Intact  Cognition:  WNL  Sleep:  Number of Hours: 7.5   Assessment: Patient presents in the day room and has been attempting to interact with peers and staff.  Patient is pleasant, calm, cooperative.  Patient does appear to be depressed still but is communicative using the ASL service.  After discussing with patient about his mood as well as his nightmares is decided to continue his Seroquel  at 200 and will start the patient on prazosin 1 mg nightly to try to improve his nightmares.  Patient does report that he is feeling some improvement and has been seeing coming out of his room more and attending groups.  Patient did report that he enjoyed the pet therapy group today because he used to have a dog that was his support animal, however he is reported that his 38-year-old black lab died at 59 years old due to a brain aneurysm so this was kind of a trigger for him seeing the dog again.  However, patient stated he still enjoys spending time with the dogs today.  Treatment Plan Summary: Daily contact with patient to assess and evaluate symptoms and progress in treatment and Medication management Continue Klonopin 0.5 mg p.o. every 6 hours as needed for anxiety Continue Prozac 20 mg p.o. daily for depression Continue Vistaril 25 mg p.o. 3 times daily as needed for anxiety Continue Seroquel 200 mg p.o. nightly for mood stability Continue trazodone 50 mg p.o. nightly as needed for insomnia Start prazosin 1 mg p.o. nightly for nightmares Encourage group therapy participation Continue every 15 minute safety checks  Maryfrances Bunnell, FNP 11/29/2019, 1:29 PM

## 2019-11-29 NOTE — Progress Notes (Signed)
D: Patient out in the dayroom with other patients. Communicating with staff via pen and paper. Pleasant and cooperative. Denies SI HI and AVH.  A; continue to monitor for safety R: safety maintained.

## 2019-11-30 NOTE — Progress Notes (Signed)
Recreation Therapy Notes   Date: 11/30/2019  Time: 9:30 am  Location: Craft Room  Behavioral response: Appropriate   Intervention Topic: Anger management    Interpreter: [id number] Joy   Discussion/Intervention:  Group content on today was focused on anger management. The group defined anger and reasons they become angry. Individuals expressed negative way they have dealt with anger in the past. Patients stated some positive ways they could deal with anger in the future. The group described how anger can affect your health and daily plans. Individuals participated in the intervention "Blowing up anger" where they had a chance to answer questions about themselves and see how much energy they waste on being angry.  Clinical Observations/Feedback:  Patient came to group and defined anger management as understanding your anger and ways to use coping skills to manage anger better. He explained that he could deal with his anger better by identifying his triggers. Participant identified walking, deep breathing and talking as ways he manages his anger. Individual was social with peers and staff while participant in the intervention during group.  Lorinda Copland LRT/CTRS           Elsi Stelzer 11/30/2019 10:46 AM

## 2019-11-30 NOTE — [facility] Group Notes (Signed)
[facility] Group Notes:  (Nursing/MHT/Case Management/Adjunct)  Date:  11/30/2019  Time:  9:58 AM  Type of Therapy:  Community Meeting  Participation Level:  Active  Participation Quality:  Appropriate and Attentive  Affect:  Appropriate  Cognitive:  Alert and Appropriate  Insight:  Appropriate  Engagement in Group:  Engaged  Modes of Intervention:  Discussion and Education  Summary of Progress/Problems:  Kerrie Pleasure 11/30/2019, 9:58 AM

## 2019-11-30 NOTE — Plan of Care (Signed)
  Problem: Education: Goal: Knowledge of the prescribed therapeutic regimen will improve Outcome: Progressing  Patient is compliant with medication regimen.

## 2019-11-30 NOTE — Progress Notes (Signed)
BRIEF PHARMACY NOTE   This patient attended and participated in Medication Management Group counseling led by [facility] staff pharmacist.  This interactive class reviews basic information about prescription medications and education on personal responsibility in medication management.  The class also includes general knowledge of 3 main classes of behavioral medications, including antipsychotics, antidepressants, and mood stabilizers.     Patient behavior was appropriate for group setting.   Educational materials sourced from:  "Medication Do's and Don'ts" from [email or web address]   "Mental Health Medications" from [facility] [email or web address]#part [email or web address]   2 interpreters were used during this group session:  #[id number] Bethann Berkshire AND #[id number] Andria Frames, PharmD, BCPS Clinical Pharmacist 11/30/2019 3:25 PM

## 2019-11-30 NOTE — Progress Notes (Signed)
[facility] MD Progress Note  11/30/2019 11:28 AM Tyler Horn  MRN:  [id number]   Subjective: Follow-up for this 59 year old male diagnosed with MDD severe.  Patient was evaluated using ASL interpreter.  Patient reports that he is feeling much better today.  He states that he slept great last night and had 0 nightmares.  He states that there is still a little bit of depression from time to time but nothing like it was when he came to the hospital.  He reports that he does feel significant improvement and knows that he will be following up at [facility] after he discharges because they have ASL services.  He also reports that he will need transportation back to the hospital in [place] to pick up his car.  Patient reports that he knows that he needs to stabilize a little bit more before his discharge but is already starting to plan what he needs to be doing.  He also reports that he is planning on getting another job after he discharges.  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  Patient denies any suicidal or homicidal ideations and denies any hallucinations.  Principal Problem: MDD (major depressive disorder), severe (HCC) Diagnosis: Principal Problem:   MDD (major depressive disorder), severe (HCC) Active Problems:   HIV (human immunodeficiency virus infection) (H[facility])   PTSD (post-traumatic stress disorder)  Total Time spent with patient: 20 minutes  Past Psychiatric History: Multiple prior hospitalizations as well as visits to the emergency room. He was just at the emergency room in [place] last month and at that time was transferred to [facility] because he was positive for COVID. He says that was a very short hospitalization. He says that he does have a therapist and he has peers support provided by a deafness support group but does not actually have a psychiatrist. He says the only medicines he remembers being prescribed were Lexapro and Seroquel. He has been  on the Lexapro for years. He claims that having now lost his insurance he feels like he can no longer afford his medicine. Patient is HIV positive by the way although he says that the disease is undetectable and that he keeps up with his medicine. 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  Past Medical History:  Past Medical History:  Diagnosis Date  . Anxiety   . Depression   . HIV (human immunodeficiency virus infection) (H[facility])   . HIV (human immunodeficiency virus infection) (H[facility])    History reviewed. No pertinent surgical history. Family History:  Family History  Problem Relation Age of Onset  . Mental illness Mother   . Bipolar disorder Brother    Family Psychiatric  History: None reported Social History:  Social History   Substance and Sexual Activity  Alcohol Use No     Social History   Substance and Sexual Activity  Drug Use No    Social History   Socioeconomic History  . Marital status: Single    Spouse name: Not on file  . Number of children: Not on file  . Years of education: Not on file  . Highest education level: Not on file  Occupational History  . Not on file  Tobacco Use  . Smoking status: Never Smoker  . Smokeless tobacco: Never Used  Substance and Sexual Activity  . Alcohol use: No  . Drug use: No  . Sexual activity: Yes  Other Topics Concern  . Not on file  Social History Narrative  . Not on file   Social Determinants of Health   Financial Resource Strain:   . Difficulty of Paying Living Expenses:   Food Insecurity:   . Worried About Programme researcher, broadcasting/film/video in the Last Year:   . Barista in the Last Year:   Transportation Needs:   . Freight forwarder (Medical):   Marland Kitchen Lack of Transportation (Non-Medical):   Physical Activity:   . Days of Exercise per Week:   . Minutes of Exercise per Session:   Stress:   . Feeling of Stress :   Social Connections:   . Frequency of Communication with Friends and  Family:   . Frequency of Social Gatherings with Friends and Family:   . Attends Religious Services:   . Active Member of Clubs or Organizations:   . Attends Banker Meetings:   Marland Kitchen Marital Status:    Additional Social History:                         Sleep: Good  Appetite:  Good  Current Medications: Current Facility-Administered Medications  Medication Dose Route Frequency Provider Last Rate Last Admin  . acetaminophen (TYLENOL) tablet 650 mg  650 mg Oral Q6H PRN Zaria Taha, Gerlene Burdock, FNP      . alum & mag hydroxide-simeth (MAALOX/MYLANTA) 200-200-20 MG/5ML suspension 30 mL  30 mL Oral Q4H PRN Kelden Lavallee, Gerlene Burdock, FNP      . clonazePAM (KLONOPIN) tablet 0.5 mg  0.5 mg Oral Q6H PRN Clapacs, Jackquline Denmark, MD   0.5 mg at 11/30/19 0829  . emtricitabine-tenofovir AF (DESCOVY) 200-25 MG per tablet 1 tablet  1 tablet Oral Daily Luvern Mischke, Gerlene Burdock, FNP   1 tablet at 11/30/19 0829  . FLUoxetine (PROZAC) capsule 20 mg  20 mg Oral Daily Clapacs, John T, MD   20 mg at 11/30/19 6644  . hydrOXYzine (ATARAX/VISTARIL) tablet 25 mg  25 mg Oral TID PRN Damien Batty, Gerlene Burdock, FNP   25 mg at 11/28/19 0826  . magnesium hydroxide (MILK OF MAGNESIA) suspension 30 mL  30 mL Oral Daily PRN Lanyia Jewel, Gerlene Burdock, FNP      . nevirapine [facility]) 50 MG/5ML suspension 200 mg  200 mg Oral Q12H Zoe Nordin, Gerlene Burdock, FNP   200 mg at 11/30/19 0829  . prazosin (MINIPRESS) capsule 1 mg  1 mg Oral QHS Adah Stoneberg, Gerlene Burdock, FNP   1 mg at 11/29/19 2104  . QUEtiapine (SEROQUEL) tablet 200 mg  200 mg Oral QHS Clapacs, John T, MD   200 mg at 11/29/19 2104  . traZODone (DESYREL) tablet 50 mg  50 mg Oral QHS PRN Mekia Dipinto, Gerlene Burdock, FNP   50 mg at 11/29/19 2104    Lab Results: No results found for this or any previous visit (from the past 48 hour(s)).  Blood Alcohol level:  Lab Results  Component Value Date   ETH <10 11/26/2019   ETH <10 10/26/2019    Metabolic Disorder Labs: Lab Results  Component Value Date   HGBA1C 5.1 07/15/2017    MPG 99.67 07/15/2017   No results found for: PROLACTIN Lab Results  Component Value Date   CHOL 171 11/28/2019   TRIG 89 11/28/2019   HDL 30 (L) 11/28/2019   CHOLHDL 5.7 11/28/2019   VLDL 18 11/28/2019   LDLCALC 123 (H) 11/28/2019   LDLCALC 179 (H) 11/13/2019    Physical Findings: AIMS:  , ,  ,  ,  CIWA:    COWS:     Musculoskeletal: Strength & Muscle Tone: within normal limits Gait & Station: normal Patient leans: N/A  Psychiatric Specialty Exam: Physical Exam  Nursing note and vitals reviewed. Constitutional: He is oriented to person, place, and time. He appears well-developed and well-nourished.  Cardiovascular: Normal rate.  Respiratory: Effort normal.  Musculoskeletal:        General: Normal range of motion.  Neurological: He is alert and oriented to person, place, and time.  Skin: Skin is warm.    Review of Systems  Constitutional: Negative.   HENT: Negative.   Eyes: Negative.   Respiratory: Negative.   Cardiovascular: Negative.   Gastrointestinal: Negative.   Genitourinary: Negative.   Musculoskeletal: Negative.   Skin: Negative.   Neurological: Negative.   Psychiatric/Behavioral: The patient is nervous/anxious.     Blood pressure 121/67, pulse 91, temperature 97.7 F (36.5 C), temperature source Oral, resp. rate 17, height 6\' 4"  (1.93 m), weight 98.4 kg, SpO2 94 %.Body mass index is 26.41 kg/m.  General Appearance: Casual  Eye Contact:  Good  Speech:  No speech, patient is deaf  Volume:  No speech, patient is deaf  Mood:  Euthymic  Affect:  Congruent  Thought Process:  Coherent and Descriptions of Associations: Intact  Orientation:  Full (Time, Place, and Person)  Thought Content:  WDL and Logical  Suicidal Thoughts:  No  Homicidal Thoughts:  No  Memory:  Immediate;   Good Recent;   Good Remote;   Good  Judgement:  Fair  Insight:  Fair  Psychomotor Activity:  Normal  Concentration:  Concentration: Fair  Recall:  [place] of Knowledge:   Fair  Language:  No speech, but ASL interprter reports appropriate signing  Akathisia:  No  Handed:  Right  AIMS (if indicated):     Assets:  Desire for Improvement Housing Resilience Social Support Transportation  ADL's:  Intact  Cognition:  WNL  Sleep:  Number of Hours: 8.15   Assessment: Patient presents in the day room and is pleasant, calm, cooperative.  Patient seems to be showing significant improvement with the medications he has been prescribed.  He reports no nightmares which is decreased his depression and anxiety in the mornings.  He states that he is feeling much better today but still has some minor depression and anxiety.  Feel that patient will probably remain in the hospital throughout the weekend and with potential discharge for Monday.  No changes to medications at this time.  Patient has been attending groups and participating using ASL interpreter.  Treatment Plan Summary: Daily contact with patient to assess and evaluate symptoms and progress in treatment and Medication management Continue Klonopin 0.5 mg p.o. every 6 hours as needed for anxiety Continue Prozac 20 mg p.o. daily for depression Continue Vistaril 25 mg p.o. 3 times daily as needed for anxiety Continue prazosin 1 mg p.o. nightly for nightmares Continue Seroquel 200 mg p.o. nightly for depression Continue trazodone 50 mg p.o. nightly as needed for insomnia Encourage group therapy participation Continue every 15 minute safety checks  Lewis Shock, FNP 11/30/2019, 11:28 AM

## 2019-11-30 NOTE — [facility] Group Notes (Signed)
[facility] Group Notes:  (Nursing/MHT/Case Management/Adjunct)  Date:  11/30/2019  Time:  10:50 PM  Type of Therapy:  Group Therapy  Participation Level:  Minimal   Summary of Progress/Problems: PT was given a document to write out his goals.   Landry Mellow 11/30/2019, 10:50 PM

## 2019-11-30 NOTE — Plan of Care (Signed)
This writer was able to fully assess patient by way of him writing out how he is feeling and his needs/wants:  D- Patient alert and oriented. Patient presented in an anxious, but pleasant mood on assessment stating that he slept better last night, reporting that he didn't have any nightmares. Patient endorsed both depression/anxiety, however, he stated that they both were high before. Patient reported to this writer that the medication is helping. Patient denies SI, HI, AVH, and pain at this time. Patient has no stated goals for today.  A- Scheduled medications administered to patient, per MD orders. Support and encouragement provided.  Routine safety checks conducted every 15 minutes.  Patient informed to notify staff with problems or concerns.  R- No adverse drug reactions noted. Patient contracts for safety at this time. Patient compliant with medications and treatment plan. Patient receptive, calm, and cooperative. Patient interacts well with others on the unit.  Patient remains safe at this time.  Problem: Education: Goal: Knowledge of [facility] Education information/materials will improve Outcome: Progressing Goal: Emotional status will improve Outcome: Progressing Goal: Mental status will improve Outcome: Progressing Goal: Verbalization of understanding the information provided will improve Outcome: Progressing   Problem: Health Behavior/Discharge Planning: Goal: Identification of resources available to assist in meeting health care needs will improve Outcome: Progressing Goal: Compliance with treatment plan for underlying cause of condition will improve Outcome: Progressing   Problem: Education: Goal: Utilization of techniques to improve thought processes will improve Outcome: Progressing Goal: Knowledge of the prescribed therapeutic regimen will improve Outcome: Progressing   Problem: Activity: Goal: Interest or engagement in leisure activities will improve Outcome:  Progressing Goal: Imbalance in normal sleep/wake cycle will improve Outcome: Progressing   Problem: Coping: Goal: Coping ability will improve Outcome: Progressing Goal: Will verbalize feelings Outcome: Progressing   Problem: Role Relationship: Goal: Will demonstrate positive changes in social behaviors and relationships Outcome: Progressing   Problem: Safety: Goal: Ability to identify and utilize support systems that promote safety will improve Outcome: Progressing   Problem: Self-Concept: Goal: Will verbalize positive feelings about self Outcome: Progressing Goal: Level of anxiety will decrease Outcome: Progressing

## 2019-11-30 NOTE — Progress Notes (Signed)
Patient alert and oriented x 3 with period of confusion to time., his affect is flat but he brightens upon approach, patient is HOH  In both ears communication was done by Clinical research associate using the interpretation machine, he was cooperative and communication was effective. 15 minutes safety checks maintained will continue to monitor

## 2019-12-01 NOTE — Progress Notes (Signed)
[facility] MD Progress Note  12/01/2019 4:27 PM Tyler Horn  MRN:  [id number]  Mr. Tyler Horn is a 59yo M with a history of MDD, who was admitted with severe depression.  Patient seen.  Chart reviewed. Patient discussed with nursing; no overnight events reported. Patient was evaluated using ASL interpreter.  Subjective: Patient reports feeling very well. He is just slightly depressed but is feeling much better that no admission. Reports no anxiety during the day on "as needed" medications. Reports good night sleep and no nightmares. He denies suicidal or homicidal ideations. He denies any new complaints, denies any complaints today at all. Denies side effects from medications. He hopes to be discharged on Monnday. He will be following up at [facility] after because they have ASL services.    Principal Problem: MDD (major depressive disorder), severe (HCC) Diagnosis: Principal Problem:   MDD (major depressive disorder), severe (HCC) Active Problems:   HIV (human immunodeficiency virus infection) (HCC)   PTSD (post-traumatic stress disorder)  Total Time spent with patient: 15 minutes  Past Psychiatric History: see H&P  Past Medical History:  Past Medical History:  Diagnosis Date  . Anxiety   . Depression   . HIV (human immunodeficiency virus infection) (HCC)   . HIV (human immunodeficiency virus infection) (HCC)    History reviewed. No pertinent surgical history. Family History:  Family History  Problem Relation Age of Onset  . Mental illness Mother   . Bipolar disorder Brother    Family Psychiatric  History: see H&P Social History:  Social History   Substance and Sexual Activity  Alcohol Use No     Social History   Substance and Sexual Activity  Drug Use No    Social History   Socioeconomic History  . Marital status: Single    Spouse name: Not on file  . Number of children: Not on file  . Years of education: Not on file  . Highest education level: Not on file  Occupational  History  . Not on file  Tobacco Use  . Smoking status: Never Smoker  . Smokeless tobacco: Never Used  Substance and Sexual Activity  . Alcohol use: No  . Drug use: No  . Sexual activity: Yes  Other Topics Concern  . Not on file  Social History Narrative  . Not on file   Social Determinants of Health   Financial Resource Strain:   . Difficulty of Paying Living Expenses:   Food Insecurity:   . Worried About Programme researcher, broadcasting/film/video in the Last Year:   . Barista in the Last Year:   Transportation Needs:   . Freight forwarder (Medical):   Marland Kitchen Lack of Transportation (Non-Medical):   Physical Activity:   . Days of Exercise per Week:   . Minutes of Exercise per Session:   Stress:   . Feeling of Stress :   Social Connections:   . Frequency of Communication with Friends and Family:   . Frequency of Social Gatherings with Friends and Family:   . Attends Religious Services:   . Active Member of Clubs or Organizations:   . Attends Banker Meetings:   Marland Kitchen Marital Status:    Additional Social History:                         Sleep: Good  Appetite:  Good  Current Medications: Current Facility-Administered Medications  Medication Dose Route Frequency Provider Last Rate Last Admin  .  acetaminophen (TYLENOL) tablet 650 mg  650 mg Oral Q6H PRN Money, Lowry Ram, FNP      . alum & mag hydroxide-simeth (MAALOX/MYLANTA) 200-200-20 MG/5ML suspension 30 mL  30 mL Oral Q4H PRN Money, Lowry Ram, FNP      . clonazePAM (KLONOPIN) tablet 0.5 mg  0.5 mg Oral Q6H PRN Clapacs, Madie Reno, MD   0.5 mg at 12/01/19 1612  . emtricitabine-tenofovir AF (DESCOVY) 200-25 MG per tablet 1 tablet  1 tablet Oral Daily Money, Lowry Ram, FNP   1 tablet at 12/01/19 0830  . FLUoxetine (PROZAC) capsule 20 mg  20 mg Oral Daily Clapacs, Tyler T, MD   20 mg at 12/01/19 0830  . hydrOXYzine (ATARAX/VISTARIL) tablet 25 mg  25 mg Oral TID PRN Money, Lowry Ram, FNP   25 mg at 11/28/19 0826  . magnesium  hydroxide (MILK OF MAGNESIA) suspension 30 mL  30 mL Oral Daily PRN Money, Lowry Ram, FNP      . nevirapine [facility]) 50 MG/5ML suspension 200 mg  200 mg Oral Q12H Money, Darnelle Maffucci B, FNP   200 mg at 12/01/19 0830  . prazosin (MINIPRESS) capsule 1 mg  1 mg Oral QHS Money, Lowry Ram, FNP   1 mg at 11/30/19 2110  . QUEtiapine (SEROQUEL) tablet 200 mg  200 mg Oral QHS Clapacs, Tyler T, MD   200 mg at 11/30/19 2110  . traZODone (DESYREL) tablet 50 mg  50 mg Oral QHS PRN Money, Lowry Ram, FNP   50 mg at 11/29/19 2104    Lab Results: No results found for this or any previous visit (from the past 48 hour(s)).  Blood Alcohol level:  Lab Results  Component Value Date   ETH <10 11/26/2019   ETH <10 53/66/4403    Metabolic Disorder Labs: Lab Results  Component Value Date   HGBA1C 5.1 07/15/2017   MPG 99.67 07/15/2017   No results found for: PROLACTIN Lab Results  Component Value Date   CHOL 171 11/28/2019   TRIG 89 11/28/2019   HDL 30 (L) 11/28/2019   CHOLHDL 5.7 11/28/2019   VLDL 18 11/28/2019   LDLCALC 123 (H) 11/28/2019   LDLCALC 179 (H) 11/13/2019    Physical Findings: AIMS:  , ,  ,  ,    CIWA:    COWS:     Musculoskeletal: Strength & Muscle Tone: within normal limits Gait & Station: normal Patient leans: N/A  Psychiatric Specialty Exam: Physical Exam  Review of Systems  Blood pressure 124/82, pulse 95, temperature 97.6 F (36.4 C), temperature source Oral, resp. rate 17, height 6\' 4"  (1.93 m), weight 98.4 kg, SpO2 96 %.Body mass index is 26.41 kg/m.  Tyler Appearance: Fairly Groomed  Eye Contact:  Good  Speech:  patient uses ASL language  Volume:  NA  Mood:  Euthymic  Affect:  Full Range  Thought Process:  Coherent, Goal Directed and Linear  Orientation:  Full (Time, Place, and Person)  Thought Content:  Logical  Suicidal Thoughts:  No  Homicidal Thoughts:  No  Memory:  Immediate;   Good Recent;   Good Remote;   Good  Judgement:  Good  Insight:  Good   Psychomotor Activity:  Normal  Concentration:  Concentration: Fair and Attention Span: Fair  Recall:  [place] of Knowledge:  Good  Language:  Good  Akathisia:  No  Handed:  Right  AIMS (if indicated):     Assets:  Communication Skills Desire for [place]  Support Transportation  ADL's:  Intact  Cognition:  WNL  Sleep:  Number of Hours: 8     Treatment Plan Summary: Daily contact with patient to assess and evaluate symptoms and progress in treatment and Medication management  Mr. Hale is a 59yo M with a history of MDD, who was admitted with severe depression. Patient reports mood improvement - he is less depressed and less anxuious. His sleep improved, no nightmares. No side effects from current medications. The current med combination will be continued without changes.   Impression: Major depressive disorder, recurrent, severe.     Plan:  -continue inpatient psych admission; 15-minute checks; daily contact with patient to assess and evaluate symptoms and progress in treatment; psychoeducation.  -continue scheduled psych medications: Prozac 20mg  PO daily for depression, Seroquel 200mg  PO QHS for mood and sleep, Prazosin 1mg  PO QHS for nightmares.  -continue PRN medications: Klonopin and Vistaril for anxiety, Trazodone for sleep.    -Disposition: home with outpatient f/u.   , MD 12/01/2019, 4:27 PM

## 2019-12-01 NOTE — Progress Notes (Signed)
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 Writer included the patient by writing out what was discussed in group. Writer agreed that the interpreter machine will be used during subsequent groups and it will be communicated to the morning shift.  15 minutes safety checks maintained will continue to monitor

## 2019-12-01 NOTE — [facility] Group Notes (Signed)
[facility] Group Notes:  (Nursing/MHT/Case Management/Adjunct)  Date:  12/01/2019  Time:  5:28 PM  Type of Therapy:  Psychoeducational Skills  Participation Level:  Active  Participation Quality:  Appropriate  Affect:  Appropriate  Cognitive:  Alert and Appropriate  Insight:  Appropriate and Good  Engagement in Group:  Engaged  Modes of Intervention:  Clarification  Summary of Progress/Problems:  [facility] 12/01/2019, 5:28 PM

## 2019-12-01 NOTE — [facility] Group Notes (Signed)
LCSW Group Therapy Note  12/01/2019 1:00pm  Type of Therapy and Topic:  Group Therapy:  Cognitive Distortions  Participation Level:  Active   Description of Group:    Patients in this group will be introduced to the topic of cognitive distortions.  Patients will identify and describe cognitive distortions, describe the feelings these distortions create for them.  Patients will identify one or more situations in their personal life where they have cognitively distorted thinking and will verbalize challenging this cognitive distortion through positive thinking skills.  Patients will practice the skill of using positive affirmations to challenge cognitive distortions using affirmation cards.    Therapeutic Goals:  1. Patient will identify two or more cognitive distortions they have used 2. Patient will identify one or more emotions that stem from use of a cognitive distortion 3. Patient will demonstrate use of a positive affirmation to counter a cognitive distortion through discussion and/or role play. 4. Patient will describe one way cognitive distortions can be detrimental to wellness   Summary of Patient Progress:  Pt identified current cognitive distortions he uses to process events in her life. The pt was able to identify a current issue, the automatic thought and challenge and process a healthier thought process regarding the identified issue.    Therapeutic Modalities:   Cognitive Behavioral Therapy Motivational Interviewing   Teresita Madura, MSW, [facility] Clinical Social Worker  12/01/2019 5:11 PM

## 2019-12-01 NOTE — Progress Notes (Signed)
Assessed patient with assistance interpreter [phone]) Patient denied SI/HI/AVH. Patient endorsed 9/10 anxiety. Is discharged focused. Requests to be have assistance shaving today.

## 2019-12-01 NOTE — Plan of Care (Signed)
  Problem: Self-Concept: Goal: Will verbalize positive feelings about self Outcome: Progressing  Writer communicated with patient through writing he expressed feeling isolated during group time in the dayroom , he wants staff to use interpreter during group. It was agreed moving forward a interpreter machine will used.

## 2019-12-01 NOTE — [facility] Group Notes (Signed)
[facility] Group Notes:  (Nursing/MHT/Case Management/Adjunct)  Date:  12/01/2019  Time:  9:13 PM  Type of Therapy:  Group Therapy  Participation Level:  Active  Participation Quality:  Appropriate and Used the video remote interpreting  Affect:  Appropriate  Cognitive:  Alert  Insight:  Good  Engagement in Group:  Other patients said his name after him.  Modes of Intervention:  Support  Summary of Progress/Problems:  Mayra Neer 12/01/2019, 9:13 PM

## 2019-12-02 MED ORDER — ONDANSETRON HCL 4 MG PO TABS
4.0000 mg | ORAL_TABLET | Freq: Three times a day (TID) | ORAL | Status: DC | PRN
  Administered 2019-12-02: 4 mg via ORAL
  Filled 2019-12-02: qty 1

## 2019-12-02 NOTE — [facility] Group Notes (Signed)
12/02/2019 1:00pm   Type of Therapy and Topic:  Group Therapy:  Change and Accountability  Participation Level:  Did Not Attend  Description of Group In this group, patients discussed power and accountability for change.  The group identified the challenges related to accountability and the difficulty of accepting the outcomes of negative behaviors.  Patients were encouraged to openly discuss a challenge/change they could take responsibility for.  Patients discussed the use of "change talk" and positive thinking as ways to support achievement of personal goals.  The group discussed ways to give support and empowerment to peers.  Therapeutic Goals: 1. Patients will state the relationship between personal power and accountability in the change process 2. Patients will identify the positive and negative consequences of a personal choice they have made 3. Patients will identify one challenge/choice they will take responsibility for making 4. Patients will discuss the role of "change talk" and the impact of positive thinking as it supports successful personal change 5. Patients will verbalize support and affirmation of change efforts in peers  Summary of Patient Progress:  X  Therapeutic Modalities Solution Focused Brief Therapy Motivational Interviewing Cognitive Behavioral Therapy    [place] Davis, MSW, LCSWA Clinical Social Worker  12/02/2019 3:50 PM   

## 2019-12-02 NOTE — Plan of Care (Signed)
This writer was able to fully assess patient by way of him writing out how he is feeling, and his needs:  D- Patient alert and oriented. Patient presented in a pleasant mood on assessment reporting that he slept good last night and had no major complaints to voice at this time. Patient reported that his depression is going down, however, he motioned that he was anxious, in which he requested medication for this purpose. Patient could not state what was making him feel this way. Patient denied SI, HI, AVH, and pain at this time. Patient's goal for today is to "make it through the day, stay positive".  A- Scheduled medications administered to patient, per MD orders. Support and encouragement provided.  Routine safety checks conducted every 15 minutes.  Patient informed to notify staff with problems or concerns.  R- No adverse drug reactions noted. Patient contracts for safety at this time. Patient compliant with medications and treatment plan. Patient receptive, calm, and cooperative. Patient interacts well with others on the unit.  Patient remains safe at this time.  Problem: Education: Goal: Knowledge of  General Education information/materials will improve Outcome: Progressing Goal: Emotional status will improve Outcome: Progressing Goal: Mental status will improve Outcome: Progressing Goal: Verbalization of understanding the information provided will improve Outcome: Progressing   Problem: Health Behavior/Discharge Planning: Goal: Identification of resources available to assist in meeting health care needs will improve Outcome: Progressing Goal: Compliance with treatment plan for underlying cause of condition will improve Outcome: Progressing   Problem: Education: Goal: Utilization of techniques to improve thought processes will improve Outcome: Progressing Goal: Knowledge of the prescribed therapeutic regimen will improve Outcome: Progressing   Problem: Activity: Goal:  Interest or engagement in leisure activities will improve Outcome: Progressing Goal: Imbalance in normal sleep/wake cycle will improve Outcome: Progressing   Problem: Coping: Goal: Coping ability will improve Outcome: Progressing Goal: Will verbalize feelings Outcome: Progressing   Problem: Role Relationship: Goal: Will demonstrate positive changes in social behaviors and relationships Outcome: Progressing   Problem: Safety: Goal: Ability to identify and utilize support systems that promote safety will improve Outcome: Progressing   Problem: Self-Concept: Goal: Will verbalize positive feelings about self Outcome: Progressing Goal: Level of anxiety will decrease Outcome: Progressing

## 2019-12-02 NOTE — Plan of Care (Signed)
Cooperative, he is  assertive with care and medication compliant. He is active in his treatment. He appears to be resting in bed quietly. He used the interpreter machine for group and nursing care discission.

## 2019-12-02 NOTE — Progress Notes (Signed)
[facility] MD Progress Note  12/02/2019 1:56 PM Tyler Horn  MRN:  [id number]  Tyler Horn is a 59yo M with a history of MDD, who was admitted with severe depression.  Patient seen.  Chart reviewed. Patient discussed with nursing; no overnight events reported. Patient was evaluated using ASL interpreter.  Subjective: Patient continues to report good mood, good night sleep without nightmares. He denies suicidal or homicidal ideations. Denies side effects from medications.  He had one episode of vomiting today and complaints about mild-moderate L-sided abdominal pain. He relates it to indigestion. He was given Maalox and Zofran; on reevaluation in 40 min patient reports feeling better ant that his pain is gone.   Principal Problem: MDD (major depressive disorder), severe ([place]) Diagnosis: Principal Problem:   MDD (major depressive disorder), severe (HCC) Active Problems:   HIV (human immunodeficiency virus infection) ([place])   PTSD (post-traumatic stress disorder)  Total Time spent with patient: 15 minutes  Past Psychiatric History: see H&P  Past Medical History:  Past Medical History:  Diagnosis Date  . Anxiety   . Depression   . HIV (human immunodeficiency virus infection) ([place])   . HIV (human immunodeficiency virus infection) ([place])    History reviewed. No pertinent surgical history. Family History:  Family History  Problem Relation Age of Onset  . Mental illness Mother   . Bipolar disorder Brother    Family Psychiatric  History: see H&P Social History:  Social History   Substance and Sexual Activity  Alcohol Use No     Social History   Substance and Sexual Activity  Drug Use No    Social History   Socioeconomic History  . Marital status: Single    Spouse name: Not on file  . Number of children: Not on file  . Years of education: Not on file  . Highest education level: Not on file  Occupational History  . Not on file  Tobacco Use  . Smoking status: Never Smoker  .  Smokeless tobacco: Never Used  Substance and Sexual Activity  . Alcohol use: No  . Drug use: No  . Sexual activity: Yes  Other Topics Concern  . Not on file  Social History Narrative  . Not on file   Social Determinants of Health   Financial Resource Strain:   . Difficulty of Paying Living Expenses:   Food Insecurity:   . Worried About Charity fundraiser in the Last Year:   . Arboriculturist in the Last Year:   Transportation Needs:   . Film/video editor (Medical):   Marland Kitchen Lack of Transportation (Non-Medical):   Physical Activity:   . Days of Exercise per Week:   . Minutes of Exercise per Session:   Stress:   . Feeling of Stress :   Social Connections:   . Frequency of Communication with Friends and Family:   . Frequency of Social Gatherings with Friends and Family:   . Attends Religious Services:   . Active Member of Clubs or Organizations:   . Attends Archivist Meetings:   Marland Kitchen Marital Status:    Additional Social History:                         Sleep: Good  Appetite:  Good  Current Medications: Current Facility-Administered Medications  Medication Dose Route Frequency Provider Last Rate Last Admin  . acetaminophen (TYLENOL) tablet 650 mg  650 mg Oral Q6H PRN Money, Lowry Ram, FNP      .  alum & mag hydroxide-simeth (MAALOX/MYLANTA) 200-200-20 MG/5ML suspension 30 mL  30 mL Oral Q4H PRN Money, Feliz Beam B, FNP   30 mL at 12/02/19 1354  . clonazePAM (KLONOPIN) tablet 0.5 mg  0.5 mg Oral Q6H PRN Clapacs, Jackquline Denmark, MD   0.5 mg at 12/02/19 0818  . emtricitabine-tenofovir AF (DESCOVY) 200-25 MG per tablet 1 tablet  1 tablet Oral Daily Money, Gerlene Burdock, FNP   1 tablet at 12/02/19 0818  . FLUoxetine (PROZAC) capsule 20 mg  20 mg Oral Daily Clapacs, John T, MD   20 mg at 12/02/19 0818  . hydrOXYzine (ATARAX/VISTARIL) tablet 25 mg  25 mg Oral TID PRN Money, Gerlene Burdock, FNP   25 mg at 11/28/19 0826  . magnesium hydroxide (MILK OF MAGNESIA) suspension 30 mL  30 mL  Oral Daily PRN Money, Gerlene Burdock, FNP      . nevirapine [facility]) 50 MG/5ML suspension 200 mg  200 mg Oral Q12H Money, Gerlene Burdock, FNP   200 mg at 12/02/19 0818  . ondansetron (ZOFRAN) tablet 4 mg  4 mg Oral Q8H PRN Tahje Borawski, MD      . prazosin (MINIPRESS) capsule 1 mg  1 mg Oral QHS Money, Gerlene Burdock, FNP   1 mg at 12/01/19 2108  . QUEtiapine (SEROQUEL) tablet 200 mg  200 mg Oral QHS Clapacs, John T, MD   200 mg at 12/01/19 2108  . traZODone (DESYREL) tablet 50 mg  50 mg Oral QHS PRN Money, Gerlene Burdock, FNP   50 mg at 12/01/19 2109    Lab Results: No results found for this or any previous visit (from the past 48 hour(s)).  Blood Alcohol level:  Lab Results  Component Value Date   ETH <10 11/26/2019   ETH <10 10/26/2019    Metabolic Disorder Labs: Lab Results  Component Value Date   HGBA1C 5.1 07/15/2017   MPG 99.67 07/15/2017   No results found for: PROLACTIN Lab Results  Component Value Date   CHOL 171 11/28/2019   TRIG 89 11/28/2019   HDL 30 (L) 11/28/2019   CHOLHDL 5.7 11/28/2019   VLDL 18 11/28/2019   LDLCALC 123 (H) 11/28/2019   LDLCALC 179 (H) 11/13/2019    Physical Findings: AIMS:  , ,  ,  ,    CIWA:    COWS:     Musculoskeletal: Strength & Muscle Tone: within normal limits Gait & Station: normal Patient leans: N/A  Psychiatric Specialty Exam: Abdomen is soft. Mild abdominal tenderness in LLA. No rebound tenderness, no distention, guarding, mass. Upon reevaluation patient denies pain.  Physical Exam  Constitutional: He is oriented to person, place, and time. He appears well-developed and well-nourished.  HENT:  Head: Normocephalic and atraumatic.  Eyes: Pupils are equal, round, and reactive to light. EOM are normal.  Cardiovascular: Normal rate and regular rhythm.  GI: Soft. He exhibits no distension and no mass. There is abdominal tenderness. There is no rebound and no guarding.  Musculoskeletal:     Cervical back: Normal range of motion.  Neurological:  He is alert and oriented to person, place, and time.    Review of Systems   Blood pressure 108/70, pulse (!) 118, temperature (!) 97.5 F (36.4 C), temperature source Oral, resp. rate 18, height 6\' 4"  (1.93 m), weight 98.4 kg, SpO2 95 %.Body mass index is 26.41 kg/m.  General Appearance: Fairly Groomed  Eye Contact:  Good  Speech:  patient uses ASL language  Volume:  NA  Mood:  Euthymic  Affect:  Full Range  Thought Process:  Coherent, Goal Directed and Linear  Orientation:  Full (Time, Place, and Person)  Thought Content:  Logical  Suicidal Thoughts:  No  Homicidal Thoughts:  No  Memory:  Immediate;   Good Recent;   Good Remote;   Good  Judgement:  Good  Insight:  Good  Psychomotor Activity:  Normal  Concentration:  Concentration: Fair and Attention Span: Fair  Recall:  Fair  Fund of Knowledge:  Good  Language:  Good  Akathisia:  No  Handed:  Right  AIMS (if indicated):     Assets:  Communication Skills Desire for Improvement Housing Physical Health Social Support Transportation  ADL's:  Intact  Cognition:  WNL  Sleep:  Number of Hours: 8     Treatment Plan Summary: Daily contact with patient to assess and evaluate symptoms and progress in treatment and Medication management  Tyler Horn is a 59yo M with a history of MDD, who was admitted with severe depression. Patient continues to report mood improvement - he is less depressed and less anxious, good sleep, no nightmares. No side effects from current medications. The current med combination will be continued without changes and patient likely will be ready for discharge tomorrow.  Impression: Major depressive disorder, recurrent, severe.     Plan:  -continue inpatient psych admission; 15-minute checks; daily contact with patient to assess and evaluate symptoms and progress in treatment; psychoeducation.  -continue scheduled psych medications: Prozac 20mg  PO daily for depression, Seroquel 200mg  PO QHS for mood  and sleep, Prazosin 1mg  PO QHS for nightmares.  -continue PRN medications: Klonopin and Vistaril for anxiety, Trazodone for sleep.    -Disposition: home with outpatient f/u.   , MD 12/02/2019, 1:56 PM

## 2019-12-03 MED ORDER — FLUOXETINE HCL 20 MG PO CAPS: 20.0000 mg | ORAL_CAPSULE | Freq: Every day | ORAL | 1 refills | Status: DC

## 2019-12-03 MED ORDER — QUETIAPINE FUMARATE 200 MG PO TABS: 200.0000 mg | ORAL_TABLET | Freq: Every day | ORAL | 0 refills | Status: DC

## 2019-12-03 MED ORDER — TRAZODONE HCL 50 MG PO TABS: 50.0000 mg | ORAL_TABLET | Freq: Every evening | ORAL | 1 refills | Status: DC | PRN

## 2019-12-03 MED ORDER — HYDROXYZINE HCL 25 MG PO TABS: 25.0000 mg | ORAL_TABLET | Freq: Three times a day (TID) | ORAL | 0 refills | Status: DC | PRN

## 2019-12-03 MED ORDER — QUETIAPINE FUMARATE 200 MG PO TABS: 200.0000 mg | ORAL_TABLET | Freq: Every day | ORAL | 1 refills | Status: DC

## 2019-12-03 MED ORDER — HYDROXYZINE HCL 25 MG PO TABS: 25.0000 mg | ORAL_TABLET | Freq: Three times a day (TID) | ORAL | 1 refills | Status: DC | PRN

## 2019-12-03 MED ORDER — FLUOXETINE HCL 20 MG PO CAPS: 20.0000 mg | ORAL_CAPSULE | Freq: Every day | ORAL | 0 refills | Status: DC

## 2019-12-03 MED ORDER — PRAZOSIN HCL 1 MG PO CAPS: 1.0000 mg | ORAL_CAPSULE | Freq: Every day | ORAL | 0 refills | Status: DC

## 2019-12-03 MED ORDER — TRAZODONE HCL 50 MG PO TABS: 50.0000 mg | ORAL_TABLET | Freq: Every evening | ORAL | 0 refills | Status: DC | PRN

## 2019-12-03 MED ORDER — PRAZOSIN HCL 1 MG PO CAPS: 1.0000 mg | ORAL_CAPSULE | Freq: Every day | ORAL | 1 refills | Status: DC

## 2019-12-03 MED ORDER — CLONAZEPAM 0.5 MG PO TABS: 0.5000 mg | ORAL_TABLET | Freq: Four times a day (QID) | ORAL | 0 refills | Status: DC | PRN

## 2019-12-03 NOTE — Plan of Care (Signed)
  Problem: Education: Goal: Knowledge of [facility] Education information/materials will improve Outcome: Progressing Goal: Emotional status will improve Outcome: Progressing Goal: Mental status will improve Outcome: Progressing Goal: Verbalization of understanding the information provided will improve Outcome: Progressing  D: Patient has been visible out on the unit. Communicates with staff through pen and paper. Denies SI, HI and AVH. Medication compliant. Pleasant and cooperative. A: Continue to monitor for safety R: Safety maintained.

## 2019-12-03 NOTE — Progress Notes (Signed)
D- Patient alert and oriented. Patient presents in an anxious, but pleasant mood on assessment reporting that he slept good last night and had no complaints to voice to this Clinical research associate. Patient stated that he has "very little" depression and his anxiety is low. Patient denies SI, HI, AVH, and pain at this time. Patient's goal for today is to "make it through the day", in which he will "go home today", in order to achieve his goal.  A- Scheduled medications administered to patient, per MD orders. Support and encouragement provided.  Routine safety checks conducted every 15 minutes.  Patient informed to notify staff with problems or concerns.  R- No adverse drug reactions noted. Patient contracts for safety at this time. Patient compliant with medications and treatment plan. Patient receptive, calm, and cooperative. Patient interacts well with others on the unit.  Patient remains safe at this time.

## 2019-12-03 NOTE — BHH Suicide Risk Assessment (Signed)
[facility] Discharge Suicide Risk Assessment   Principal Problem: MDD (major depressive disorder), severe (HCC) Discharge Diagnoses: Principal Problem:   MDD (major depressive disorder), severe (HCC) Active Problems:   HIV (human immunodeficiency virus infection) (HCC)   PTSD (post-traumatic stress disorder)   Total Time spent with patient: 30 minutes  Musculoskeletal: Strength & Muscle Tone: within normal limits Gait & Station: normal Patient leans: N/A  Psychiatric Specialty Exam: Review of Systems  Constitutional: Negative.   HENT: Negative.   Eyes: Negative.   Respiratory: Negative.   Cardiovascular: Negative.   Gastrointestinal: Negative.   Musculoskeletal: Negative.   Skin: Negative.   Neurological: Negative.   Psychiatric/Behavioral: Negative.     Blood pressure 125/72, pulse 98, temperature 97.6 F (36.4 C), temperature source Oral, resp. rate 18, height 6\' 4"  (1.93 m), weight 98.4 kg, SpO2 96 %.Body mass index is 26.41 kg/m.  General Appearance: Casual  Eye Contact::  Good  Speech:  Exit interview conducted with the assistance of American sign language interpreter409  Volume:  Normal  Mood:  Euthymic  Affect:  Congruent  Thought Process:  Goal Directed  Orientation:  Full (Time, Place, and Person)  Thought Content:  Logical  Suicidal Thoughts:  No  Homicidal Thoughts:  No  Memory:  Immediate;   Fair Recent;   Fair Remote;   Fair  Judgement:  Fair  Insight:  Fair  Psychomotor Activity:  Normal  Concentration:  Fair  Recall:  [phone] of Knowledge:Fair  Language: Fair  Akathisia:  No  Handed:  Right  AIMS (if indicated):     Assets:  Desire for Improvement Housing Resilience Social Support  Sleep:  Number of Hours: 8  Cognition: WNL  ADL's:  Intact   Mental Status Per Nursing Assessment::   On Admission:  Suicidal ideation indicated by patient  Demographic Factors:  Male, Low socioeconomic status, Living alone and Unemployed  Loss  Factors: Decline in physical health  Historical Factors: Impulsivity  Risk Reduction Factors:   Positive social support, Positive therapeutic relationship and Positive coping skills or problem solving skills  Continued Clinical Symptoms:  Depression:   Anhedonia  Cognitive Features That Contribute To Risk:  None    Suicide Risk:  Minimal: No identifiable suicidal ideation.  Patients presenting with no risk factors but with morbid ruminations; may be classified as minimal risk based on the severity of the depressive symptoms    Plan Of Care/Follow-up recommendations:  Activity:  Activity as tolerated Diet:  Regular diet Other:  Follow-up with local mental health as recommended in discharge planning  [phone], MD 12/03/2019, 11:26 AM

## 2019-12-03 NOTE — Progress Notes (Signed)
Recreation Therapy Notes  Date: 12/03/2019  Time: 9:30 am  Location: Craft Room  Behavioral response: Appropriate   Intervention Topic: Happiness   Interpreter: Jonnie Kind [phone]  Discussion/Intervention:  Group content today was focused on Happiness. The group defined happiness and described where happiness comes from. Individuals identified what makes them happy and how they go about making others happy. Patients expressed things that stop them from being happy and ways they can improve their happiness. The group stated reasons why it is important to be happy. The group participated in the intervention "My Happiness", where they had a chance to identify and express things that make them happy. Clinical Observations/Feedback:  Patient came to group and defined happiness as feeling blessed. He stated that the beach and his support animal makes him happy. Participant explained that happiness is important because it affects your overall health and promotes being productive. Individual was social with peers and staff while participating in the intervention during group.  Cyris Maalouf LRT/CTRS          Cheyene Hamric 12/03/2019 11:07 AM

## 2019-12-03 NOTE — BHH Group Notes (Signed)
LCSW Group Therapy Note   12/03/2019 1:53 PM   Type of Therapy and Topic:  Group Therapy:  Overcoming Obstacles   Participation Level:  Minimal   Description of Group:    In this group patients will be encouraged to explore what they see as obstacles to their own wellness and recovery. They will be guided to discuss their thoughts, feelings, and behaviors related to these obstacles. The group will process together ways to cope with barriers, with attention given to specific choices patients can make. Each patient will be challenged to identify changes they are motivated to make in order to overcome their obstacles. This group will be process-oriented, with patients participating in exploration of their own experiences as well as giving and receiving support and challenge from other group members.   Therapeutic Goals: 1. Patient will identify personal and current obstacles as they relate to admission. 2. Patient will identify barriers that currently interfere with their wellness or overcoming obstacles.  3. Patient will identify feelings, thought process and behaviors related to these barriers. 4. Patient will identify two changes they are willing to make to overcome these obstacles:      Summary of Patient Progress Pt was present in group and participated in the group activity, but did not engage in the group discussion.     Therapeutic Modalities:   Cognitive Behavioral Therapy Solution Focused Therapy Motivational Interviewing Relapse Prevention Therapy  Iris Pert, MSW, LCSW Clinical Social Work 12/03/2019 1:53 PM

## 2019-12-03 NOTE — Progress Notes (Signed)
  [facility] Adult Case Management Discharge Plan :  Will you be returning to the same living situation after discharge:  Yes,  home At discharge, do you have transportation home?: Yes,  safe transport Do you have the ability to pay for your medications: Yes,  mental health  Release of information consent forms completed and in the chart;    Patient to Follow up at: Follow-up Information    [facility] Follow up on 12/06/2019.   Why: You have an appointment scheduled for 12/06/19 at 10am in person. Please arrive 15 mins before your appointment. They have already put in a request for ASL interpreter. Thank You! Contact information: [address] [place] [address] [phone]           Next level of care provider has access to [facility] Link:no  Safety Planning and Suicide Prevention discussed: Yes,  SPE completed with pts mother  Have you used any form of tobacco in the last 30 days? (Cigarettes, Smokeless Tobacco, Cigars, and/or Pipes): No  Has patient been referred to the Quitline?: Patient refused referral  Patient has been referred for addiction treatment: N/A  Mechele Dawley, LCSW 12/03/2019, 2:23 PM

## 2019-12-03 NOTE — Progress Notes (Signed)
Patient ID: Tyler Horn, male   DOB: August 02, 1961, 59 y.o.   MRN: [id number]  With the help of [id number], Interpreter Services:  Discharge Note:  Patient denies SI/HI/AVH at this time. Discharge instructions, AVS, prescriptions, and seven-day supply gone over with patient. Patient agrees to comply with medication management, follow-up visit, and outpatient therapy. Patient belongings returned to patient. Patient questions and concerns addressed and answered. Patient ambulatory off unit. Patient discharged to [facility], via [facility], to retrieve his car.

## 2019-12-03 NOTE — Plan of Care (Signed)
  Problem: Group Participation Goal: STG - Patient will engage in groups without prompting or encouragement from LRT x3 group sessions within 5 recreation therapy group sessions Description: STG - Patient will engage in groups without prompting or encouragement from LRT x3 group sessions within 5 recreation therapy group sessions Outcome: Completed/Met

## 2019-12-03 NOTE — Progress Notes (Signed)
D: Patient has been visible out on the unit. Communicates with staff through pen and paper. Denies SI, HI and AVH. Medication compliant. Pleasant and cooperative. A: Continue to monitor for safety R: Safety maintained.

## 2019-12-03 NOTE — Discharge Summary (Signed)
Physician Discharge Summary Note  Patient:  Tyler Horn is an 59 y.o., male MRN:  [id number] DOB:  [birth year] Patient phone:  [phone] (home)  Patient address:   [address] [place] [address],  Total Time spent with patient: 30 minutes  Date of Admission:  11/28/2019 Date of Discharge: 12/03/19  Reason for Admission:  Patient was interviewed with the assistance of a hospital provided official American sign language interpreter 59 year old man with a long history of depression problems comes to the hospital after presenting with a recent suicide attempt.  Patient reports that he lost his job over a month ago.  Since then he has felt increasingly isolated and more and more depressed.  Has been having tactile and visual hallucinations.  He says he ran out of his medicine a few weeks ago.  He developed a plan to kill himself and specifically went to purchase over-the-counter sleeping medicines and ODD on them but ended up not dying only sleeping for more than a day.  Principal Problem: MDD (major depressive disorder), severe (HCC) Discharge Diagnoses: Principal Problem:   MDD (major depressive disorder), severe (HCC) Active Problems:   HIV (human immunodeficiency virus infection) (HCC)   PTSD (post-traumatic stress disorder)   Past Psychiatric History: Multiple prior hospitalizations as well as visits to the emergency room.  He was just at the emergency room in [place] last month and at that time was transferred to [facility] because he was positive for COVID.  He says that was a very short hospitalization.  He says that he does have a therapist and he has peers support provided by a deafness support group but does not actually have a psychiatrist.  He says the only medicines he remembers being prescribed were Lexapro and Seroquel.  He has been on the Lexapro for years.  He claims that having now lost his insurance he feels like he can no longer afford his medicine.   Patient is HIV positive by the way although he says that the disease is undetectable and that he keeps up with his medicine.  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  Past Medical History:  Past Medical History:  Diagnosis Date  . Anxiety   . Depression   . HIV (human immunodeficiency virus infection) (HCC)   . HIV (human immunodeficiency virus infection) (HCC)    History reviewed. No pertinent surgical history. Family History:  Family History  Problem Relation Age of Onset  . Mental illness Mother   . Bipolar disorder Brother    Family Psychiatric  History: None reported Social History:  Social History   Substance and Sexual Activity  Alcohol Use No     Social History   Substance and Sexual Activity  Drug Use No    Social History   Socioeconomic History  . Marital status: Single    Spouse name: Not on file  . Number of children: Not on file  . Years of education: Not on file  . Highest education level: Not on file  Occupational History  . Not on file  Tobacco Use  . Smoking status: Never Smoker  . Smokeless tobacco: Never Used  Substance and Sexual Activity  . Alcohol use: No  . Drug use: No  . Sexual activity: Yes  Other Topics Concern  . Not on file  Social History Narrative  . Not on file   Social Determinants of Health   Financial Resource Strain:   . Difficulty  of Paying Living Expenses:   Food Insecurity:   . Worried About Programme researcher, broadcasting/film/video in the Last Year:   . Barista in the Last Year:   Transportation Needs:   . Freight forwarder (Medical):   Marland Kitchen Lack of Transportation (Non-Medical):   Physical Activity:   . Days of Exercise per Week:   . Minutes of Exercise per Session:   Stress:   . Feeling of Stress :   Social Connections:   . Frequency of Communication with Friends and Family:   . Frequency of Social Gatherings with Friends and Family:   . Attends Religious Services:   . Active Member of  Clubs or Organizations:   . Attends Banker Meetings:   Marland Kitchen Marital Status:     Hospital Course:  Patient remained on the [facility] unit for 5 days. The patient stabilized on medication and therapy. Patient was discharged on Klonopin 0.5 mg p.o. every 6 hours as needed, Prozac 20 mg daily, Vistaril 25 mg 3 times daily as needed, prazosin 1 mg nightly, Seroquel 200 mg nightly, trazodone 50 mg nightly as needed, and to continue his HIV antiviral medications.  Patient reported that he had enough of these medications at home that he did not need a prescription or samples of these.. Patient has shown improvement with improved mood, affect, sleep, appetite, and interaction. Patient has attended group and participated. Patient has been seen in the day room interacting with peers and staff appropriately. Patient denies any SI/HI/AVH and contracts for safety. Patient agrees to follow up at [facility]. Patient is provided with prescriptions for their medications upon discharge.  Physical Findings: AIMS:  , ,  ,  ,    CIWA:    COWS:     Musculoskeletal: Strength & Muscle Tone: within normal limits Gait & Station: normal Patient leans: N/A  Psychiatric Specialty Exam: Physical Exam  Nursing note and vitals reviewed. Constitutional: He is oriented to person, place, and time. He appears well-developed and well-nourished.  Cardiovascular: Normal rate.  Respiratory: Effort normal.  Musculoskeletal:        General: Normal range of motion.  Neurological: He is alert and oriented to person, place, and time.  Skin: Skin is warm.    Review of Systems  Constitutional: Negative.   HENT: Negative.   Eyes: Negative.   Respiratory: Negative.   Cardiovascular: Negative.   Gastrointestinal: Negative.   Genitourinary: Negative.   Musculoskeletal: Negative.   Skin: Negative.   Neurological: Negative.     Blood pressure 125/72, pulse 98, temperature 97.6 F (36.4 C), temperature source Oral, resp. rate  18, height 6\' 4"  (1.93 m), weight 98.4 kg, SpO2 96 %.Body mass index is 26.41 kg/m.   General Appearance: Casual  Eye Contact::  Good  Speech:  Exit interview conducted with the assistance of American sign language interpreter409  Volume:  Normal  Mood:  Euthymic  Affect:  Congruent  Thought Process:  Goal Directed  Orientation:  Full (Time, Place, and Person)  Thought Content:  Logical  Suicidal Thoughts:  No  Homicidal Thoughts:  No  Memory:  Immediate;   Fair Recent;   Fair Remote;   Fair  Judgement:  Fair  Insight:  Fair  Psychomotor Activity:  Normal  Concentration:  Fair  Recall:  [employer] of Knowledge:Fair  Language: Fair  Akathisia:  No  Handed:  Right  AIMS (if indicated):     Assets:  Desire for Improvement Housing Resilience Social  Support  Sleep:  Number of Hours: 8  Cognition: WNL  ADL's:  Intact   Have you used any form of tobacco in the last 30 days? (Cigarettes, Smokeless Tobacco, Cigars, and/or Pipes): No  Has this patient used any form of tobacco in the last 30 days? (Cigarettes, Smokeless Tobacco, Cigars, and/or Pipes) Yes, No  Blood Alcohol level:  Lab Results  Component Value Date   ETH <10 11/26/2019   ETH <10 10/26/2019    Metabolic Disorder Labs:  Lab Results  Component Value Date   HGBA1C 5.1 07/15/2017   MPG 99.67 07/15/2017   No results found for: PROLACTIN Lab Results  Component Value Date   CHOL 171 11/28/2019   TRIG 89 11/28/2019   HDL 30 (L) 11/28/2019   CHOLHDL 5.7 11/28/2019   VLDL 18 11/28/2019   LDLCALC 123 (H) 11/28/2019   LDLCALC 179 (H) 11/13/2019    See Psychiatric Specialty Exam and Suicide Risk Assessment completed by Attending Physician prior to discharge.  Discharge destination:  Home  Is patient on multiple antipsychotic therapies at discharge:  No   Has Patient had three or more failed trials of antipsychotic monotherapy by history:  No  Recommended Plan for Multiple Antipsychotic  Therapies: NA  Discharge Instructions    Diet - low sodium heart healthy   Complete by: As directed    Increase activity slowly   Complete by: As directed      Allergies as of 12/03/2019   No Known Allergies     Medication List    STOP taking these medications   escitalopram 20 MG tablet Commonly known as: LEXAPRO     TAKE these medications     Indication  clonazePAM 0.5 MG tablet Commonly known as: KLONOPIN Take 1 tablet (0.5 mg total) by mouth every 6 (six) hours as needed (anxiety).  Indication: Feeling Anxious   emtricitabine-tenofovir AF 200-25 MG tablet Commonly known as: DESCOVY Take 1 tablet by mouth daily.  Indication: HIV Disease   FLUoxetine 20 MG capsule Commonly known as: PROZAC Take 1 capsule (20 mg total) by mouth daily. Start taking on: December 04, 2019  Indication: Major Depressive Disorder   hydrOXYzine 25 MG tablet Commonly known as: ATARAX/VISTARIL Take 1 tablet (25 mg total) by mouth 3 (three) times daily as needed for anxiety.  Indication: Feeling Anxious   nevirapine 200 MG tablet Commonly known as: VIRAMUNE Take 1 tablet (200 mg total) by mouth every 12 (twelve) hours.  Indication: HIV Disease   prazosin 1 MG capsule Commonly known as: MINIPRESS Take 1 capsule (1 mg total) by mouth at bedtime.  Indication: Frightening Dreams   QUEtiapine 200 MG tablet Commonly known as: SEROQUEL Take 1 tablet (200 mg total) by mouth at bedtime. What changed:   medication strength  how much to take  Indication: Depressive Phase of Manic-Depression   traZODone 50 MG tablet Commonly known as: DESYREL Take 1 tablet (50 mg total) by mouth at bedtime as needed for sleep.  Indication: Trouble Sleeping      Follow-up Information    [facility] Follow up on 12/06/2019.   Why: You have an appointment scheduled for 4/8 at 10am. Thank You! Contact information: [address] [place] [address] [phone]           Follow-up  recommendations:  Continue activity as tolerated. Continue diet as recommended by your PCP. Ensure to keep all appointments with outpatient providers.  Comments:  Patient is instructed prior to discharge to: Take all  medications as prescribed by his/her mental healthcare provider. Report any adverse effects and or reactions from the medicines to his/her outpatient provider promptly. Patient has been instructed & cautioned: To not engage in alcohol and or illegal drug use while on prescription medicines. In the event of worsening symptoms, patient is instructed to call the crisis hotline, 911 and or go to the nearest ED for appropriate evaluation and treatment of symptoms. To follow-up with his/her primary care provider for your other medical issues, concerns and or health care needs.    Signed: Gerlene Burdock Shronda Boeh, FNP 12/03/2019, 1:15 PM

## 2019-12-03 NOTE — Progress Notes (Signed)
Recreation Therapy Notes  INPATIENT RECREATION TR PLAN  Patient Details Name: Tyler Horn MRN: [id number] DOB: 06/03/1961 Today's Date: 12/03/2019  Rec Therapy Plan Is patient appropriate for Therapeutic Recreation?: Yes Treatment times per week: At least 3 Estimated Length of Stay: 5-7 days TR Treatment/Interventions: Group participation (Comment)  Discharge Criteria Pt will be discharged from therapy if:: Discharged Treatment plan/goals/alternatives discussed and agreed upon by:: Patient/family  Discharge Summary Short term goals set: Patient will engage in groups without prompting or encouragement from LRT x3 group sessions within 5 recreation therapy group sessions Short term goals met: Complete Progress toward goals comments: Groups attended Which groups?: Anger management, AAA/T, Other (Comment)(Happiness) Reason goals not met: N/A Therapeutic equipment acquired: N/A Reason patient discharged from therapy: Discharge from hospital Pt/family agrees with progress & goals achieved: Yes Date patient discharged from therapy: 12/03/19      12/03/2019, 1:54 PM  

## 2019-12-19 ENCOUNTER — Encounter: Payer: Self-pay | Admitting: Family

## 2019-12-19 ENCOUNTER — Ambulatory Visit (INDEPENDENT_AMBULATORY_CARE_PROVIDER_SITE_OTHER): Payer: Self-pay | Admitting: Family

## 2019-12-19 ENCOUNTER — Other Ambulatory Visit: Payer: Self-pay

## 2019-12-19 VITALS — BP 129/86 | HR 105 | Temp 98.5°F | Ht 76.0 in | Wt 226.0 lb

## 2019-12-19 DIAGNOSIS — F331 Major depressive disorder, recurrent, moderate: Secondary | ICD-10-CM

## 2019-12-19 DIAGNOSIS — A515 Early syphilis, latent: Secondary | ICD-10-CM

## 2019-12-19 DIAGNOSIS — Z21 Asymptomatic human immunodeficiency virus [HIV] infection status: Secondary | ICD-10-CM

## 2019-12-19 DIAGNOSIS — Z0000 Encounter for general adult medical examination without abnormal findings: Secondary | ICD-10-CM | POA: Insufficient documentation

## 2019-12-19 DIAGNOSIS — F411 Generalized anxiety disorder: Secondary | ICD-10-CM

## 2019-12-19 MED ORDER — MIRTAZAPINE 15 MG PO TABS: 15.0000 mg | ORAL_TABLET | Freq: Every day | ORAL | 1 refills | Status: DC

## 2019-12-19 MED ORDER — BICTEGRAVIR-EMTRICITAB-TENOFOV 50-200-25 MG PO TABS: 1.0000 | ORAL_TABLET | Freq: Every day | ORAL | 4 refills | Status: DC

## 2019-12-19 NOTE — Patient Instructions (Addendum)
Nice to meet you.  STOP taking PROZAC.   Continue to take your Neviripine and Truvada.   Change to Biktarvy at refill.   Start the Remeron nightly.   Plan for follow up in 2 months or sooner if needed with lab work 1-2 weeks prior to appointment.

## 2019-12-19 NOTE — Assessment & Plan Note (Signed)
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  Treatment performed at 1: 16 with previous RPR being nonreactive.  Follow-up RPR in 3 months to ensure resolution.

## 2019-12-19 NOTE — Assessment & Plan Note (Signed)
Mr. Portocarrero is a 59 year old male with HIV-1 disease initially diagnosed in 2005 with unknown CD4 nadir or viral load.  Currently well controlled with current dose of nevirapine and Truvada.  No signs/symptoms of opportunistic infection or progressive HIV disease.  We reviewed his lab work and discussed the plan of care.  Will ease medication regimen down to 1 pill once a day with Biktarvy.  He will complete his current supply of nevirapine and Truvada and then switch to [employer].  Plan for follow-up in 2 months or sooner if needed with lab work 1 to 2 weeks prior to appointment.

## 2019-12-19 NOTE — Assessment & Plan Note (Signed)
Mr. Leppo continues to have increased levels of anxiety which are currently refractory to his Prozac, Seroquel, and trazodone.  Prozac has been known to increase anxiety levels and will discontinue.  Start mirtazapine to aid with anxiety, depression, and sleep.  Continue with plan to follow-up with psychiatry and adjustments per psychiatry recommendations.

## 2019-12-19 NOTE — Assessment & Plan Note (Signed)
   Immunizations updated in chart today.  Discussed importance of safe sexual practice to reduce risk of STI.  Condoms declined.

## 2019-12-19 NOTE — Assessment & Plan Note (Signed)
Mr. Tyler Horn has labile depression symptoms with increasing levels of anxiety.  No suicidal ideations or signs of psychosis today.  Recommend discontinuing Prozac and starting mirtazapine.  He is seeking counseling through our office as well as psychiatry for medication assistance.  Continue to monitor.

## 2019-12-19 NOTE — Progress Notes (Signed)
Subjective:    Patient ID: Tyler Horn, male    DOB: 11-06-1960, 59 y.o.   MRN: [id number]  Chief Complaint  Patient presents with  . New Patient (Initial Visit)    discuss medication for sleep and anxitey     HPI:  Tyler Horn is a 59 y.o. male with previous medical history of HIV disease (diagnosed in 2005), depression, anxiety, PTSD, and deafness presents today to establish/transfer care for HIV disease from [place], [place]. A sign language interpreter is present to assist in communication.   [name] was previously followed by [facility] in [place], [place] where he has been well controlled and maintained on nevirapine and emtricitabine-tenofovir. Initially diagnosed in 2005 and has had no history of opportunistic infection and does not recall an symptoms associated with acute retroviral syndrome. Initial CD4 nadir and viral load are not currently available. ART regimen in the past has consisted of Atripla while he was in a study and has been on nevirapine and emtricitabine-tenofovir since that time. He is currently unemployed and seeking work. 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 Working with [employer] for assistance as well.  [name] continues to take his nevirapine and emtricitibine-tenofovir (Truvada) as prescribed daily with no adverse side effects and the occasional missed dose.  Overall feeling well today with concerns for increasing levels of anxiety and stress. Denies fevers, chills, night sweats, headaches, changes in vision, neck pain/stiffness, nausea, diarrhea, vomiting, lesions or rashes.   [name] has no problems obtaining his medication from the pharmacy and is currently enrolled in [facility].  No recreational or illicit drug use, tobacco use, or alcohol consumption.  He is sexually active and uses condoms when he is.  Initial clinic blood work completed on 11/26/2019 with viral  load that is undetectable and CD4 count of 681.  STI testing was negative for gonorrhea and chlamydia.  RPR was positive for syphilis with a titer of 1: 16 which was treated approximately 2 weeks ago by the health department with 1 injection of 2,400,000 units of Bicillin.  Kidney function, liver function, electrolytes within normal ranges.  Lipid profile with LDL of 123, HDL 30, and triglycerides of 89.  [name] has been taking Seroquel, Prozac, and clonazepam for his anxiety and depression with no significant improvements in the severity is affecting his ability to sleep.  The Prozac is new as of the last couple weeks when he was seen in the hospital [facility].  No suicidal ideations.  He has a follow-up appointment with psychiatry in a week.  No Known Allergies   Outpatient Medications Prior to Visit  Medication Sig Dispense Refill  . emtricitabine-tenofovir AF (DESCOVY) 200-25 MG tablet Take 1 tablet by mouth daily. 30 tablet 0  . nevirapine (VIRAMUNE) 200 MG tablet Take 1 tablet (200 mg total) by mouth every 12 (twelve) hours. 60 tablet 0  . prazosin (MINIPRESS) 1 MG capsule Take 1 capsule (1 mg total) by mouth at bedtime. 30 capsule 1  . FLUoxetine (PROZAC) 20 MG capsule Take 1 capsule (20 mg total) by mouth daily. 30 capsule 1  . QUEtiapine (SEROQUEL) 200 MG tablet Take 1 tablet (200 mg total) by mouth at bedtime. 30 tablet 1  . hydrOXYzine (ATARAX/VISTARIL) 25 MG tablet Take 1 tablet (25 mg total) by mouth 3 (three) times daily as needed for anxiety. (Patient not taking: Reported on 12/19/2019) 30 tablet 1  .  clonazePAM (KLONOPIN) 0.5 MG tablet Take 1 tablet (0.5 mg total) by mouth every 6 (six) hours as needed (anxiety). (Patient not taking: Reported on 12/19/2019) 30 tablet 0  . traZODone (DESYREL) 50 MG tablet Take 1 tablet (50 mg total) by mouth at bedtime as needed for sleep. (Patient not taking: Reported on 12/19/2019) 30 tablet 1   No facility-administered  medications prior to visit.     Past Medical History:  Diagnosis Date  . Anxiety   . Depression   . HIV (human immunodeficiency virus infection) (HCC)   . HIV (human immunodeficiency virus infection) (HCC)       Past Surgical History:  Procedure Laterality Date  . CATARACT EXTRACTION Left       Family History  Problem Relation Age of Onset  . Mental illness Mother   . Bipolar disorder Brother       Social History   Socioeconomic History  . Marital status: Single    Spouse name: Not on file  . Number of children: Not on file  . Years of education: Not on file  . Highest education level: Not on file  Occupational History  . Not on file  Tobacco Use  . Smoking status: Never Smoker  . Smokeless tobacco: Never Used  Substance and Sexual Activity  . Alcohol use: No  . Drug use: No  . Sexual activity: Yes  Other Topics Concern  . Not on file  Social History Narrative  . Not on file   Social Determinants of Health   Financial Resource Strain:   . Difficulty of Paying Living Expenses:   Food Insecurity:   . Worried About Programme researcher, broadcasting/film/video in the Last Year:   . Barista in the Last Year:   Transportation Needs:   . Freight forwarder (Medical):   Marland Kitchen Lack of Transportation (Non-Medical):   Physical Activity:   . Days of Exercise per Week:   . Minutes of Exercise per Session:   Stress:   . Feeling of Stress :   Social Connections:   . Frequency of Communication with Friends and Family:   . Frequency of Social Gatherings with Friends and Family:   . Attends Religious Services:   . Active Member of Clubs or Organizations:   . Attends Banker Meetings:   Marland Kitchen Marital Status:   Intimate Partner Violence:   . Fear of Current or Ex-Partner:   . Emotionally Abused:   Marland Kitchen Physically Abused:   . Sexually Abused:       Review of Systems  Constitutional: Negative for appetite change, chills, fatigue, fever and unexpected weight change.    Eyes: Negative for visual disturbance.  Respiratory: Negative for cough, chest tightness, shortness of breath and wheezing.   Cardiovascular: Negative for chest pain and leg swelling.  Gastrointestinal: Negative for abdominal pain, constipation, diarrhea, nausea and vomiting.  Genitourinary: Negative for dysuria, flank pain, frequency, genital sores, hematuria and urgency.  Skin: Negative for rash.  Allergic/Immunologic: Negative for immunocompromised state.  Neurological: Negative for dizziness and headaches.  Psychiatric/Behavioral: Positive for sleep disturbance. Negative for dysphoric mood, hallucinations and suicidal ideas. The patient is nervous/anxious.        Objective:    BP 129/86 (BP Location: Left Arm)   Pulse (!) 105   Temp 98.5 F (36.9 C)   Ht 6\' 4"  (1.93 m)   Wt 226 lb (102.5 kg)   SpO2 95%   BMI 27.51 kg/m  Nursing note and  vital signs reviewed.  Physical Exam Constitutional:      General: He is not in acute distress.    Appearance: He is well-developed.  Eyes:     Conjunctiva/sclera: Conjunctivae normal.  Cardiovascular:     Rate and Rhythm: Regular rhythm. Tachycardia present.     Heart sounds: Normal heart sounds. No murmur. No friction rub. No gallop.   Pulmonary:     Effort: Pulmonary effort is normal. No respiratory distress.     Breath sounds: Normal breath sounds. No wheezing or rales.  Chest:     Chest wall: No tenderness.  Abdominal:     General: Bowel sounds are normal.     Palpations: Abdomen is soft.     Tenderness: There is no abdominal tenderness.  Musculoskeletal:     Cervical back: Neck supple.  Lymphadenopathy:     Cervical: No cervical adenopathy.  Skin:    General: Skin is warm and dry.     Findings: No rash.  Neurological:     Mental Status: He is alert and oriented to person, place, and time.  Psychiatric:        Behavior: Behavior normal.        Thought Content: Thought content normal.        Judgment: Judgment normal.          Assessment & Plan:   Patient Active Problem List   Diagnosis Date Noted  . Healthcare maintenance 12/19/2019  . 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 PTSD (post-traumatic stress disorder) 11/28/2019  . 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 Diarrhea   . Anxiety state   . Major depressive disorder, recurrent, severe without psychotic behavior (HCC) 07/13/2017  . Major depressive disorder, single episode, severe without psychotic features (HCC)   . MDD (major depressive disorder), recurrent, severe, with psychosis (HCC) 07/31/2014  . Moderate benzodiazepine use disorder (HCC) 07/31/2014  . GAD (generalized anxiety disorder) 07/31/2014  . HIV (human immunodeficiency virus infection) (HCC)   . MDD (major depressive disorder), severe (HCC) 07/30/2014  . Delusional disorder (HCC) 07/30/2014  . Suicidal ideation 07/30/2014  . Hallucinations   . Depression, major, recurrent (HCC) 10/03/2013  . Anxiety, generalized 03/26/2013     Problem List Items Addressed This Visit      Other   MDD (major depressive disorder), severe (HCC)   Relevant Medications   mirtazapine (REMERON) 15 MG tablet   Anxiety, generalized   Relevant Medications   mirtazapine (REMERON) 15 MG tablet   Depression, major, recurrent (HCC)    Mr. Kulzer has labile depression symptoms with increasing levels of anxiety.  No suicidal ideations or signs of psychosis today.  Recommend discontinuing Prozac and starting mirtazapine.  He is seeking counseling through our office as well as psychiatry for medication assistance.  Continue to monitor.      Relevant Medications   mirtazapine (REMERON) 15 MG tablet   GAD (generalized anxiety disorder)    Mr. Hipple continues to have increased levels of anxiety which are currently refractory to his Prozac, Seroquel, and trazodone.  Prozac has been known to increase anxiety levels and will discontinue.   Start mirtazapine to aid with anxiety, depression, and sleep.  Continue with plan to follow-up with psychiatry and adjustments per psychiatry recommendations.      Relevant Medications   mirtazapine (REMERON) 15 MG tablet   HIV (human immunodeficiency virus infection) (HCC) - Primary    Mr. Sydow is a 59 year old male with HIV-1  disease initially diagnosed in 2005 with unknown CD4 nadir or viral load.  Currently well controlled with current dose of nevirapine and Truvada.  No signs/symptoms of opportunistic infection or progressive HIV disease.  We reviewed his lab work and discussed the plan of care.  Will ease medication regimen down to 1 pill once a day with Biktarvy.  He will complete his current supply of nevirapine and Truvada and then switch to [employer].  Plan for follow-up in 2 months or sooner if needed with lab work 1 to 2 weeks prior to appointment.      Relevant Medications   bictegravir-emtricitabine-tenofovir AF (BIKTARVY) 50-200-25 MG TABS tablet   Healthcare maintenance     Immunizations updated in chart today.  Discussed importance of safe sexual practice to reduce risk of STI.  Condoms declined.      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  Treatment performed at 1: 16 with previous RPR being nonreactive.  Follow-up RPR in 3 months to ensure resolution.      Relevant Medications   bictegravir-emtricitabine-tenofovir AF (BIKTARVY) 50-200-25 MG TABS tablet       I have discontinued Varian Aull's clonazePAM, traZODone, QUEtiapine, and FLUoxetine. I am also having him start on mirtazapine and bictegravir-emtricitabine-tenofovir AF. Additionally, I am having him maintain his emtricitabine-tenofovir AF, nevirapine, prazosin, and hydrOXYzine.   Meds ordered this encounter  Medications  . mirtazapine (REMERON) 15 MG tablet    Sig: Take 1 tablet (15 mg total) by mouth at bedtime.     Dispense:  30 tablet    Refill:  1    Order Specific Question:   Supervising Provider    Answer:   Judyann Munson [[id number]]  . bictegravir-emtricitabine-tenofovir AF (BIKTARVY) 50-200-25 MG TABS tablet    Sig: Take 1 tablet by mouth daily.    Dispense:  30 tablet    Refill:  4    Please do not fill until patient requests refill    Order Specific Question:   Supervising Provider    Answer:   Judyann Munson [[id number]]     Follow-up: Return in about 2 months (around 02/18/2020).    Marcos Eke, MSN, FNP-C Nurse Practitioner [facility] Main number: [phone]

## 2019-12-25 ENCOUNTER — Ambulatory Visit: Payer: Self-pay

## 2019-12-25 DIAGNOSIS — F411 Generalized anxiety disorder: Secondary | ICD-10-CM

## 2019-12-27 ENCOUNTER — Ambulatory Visit: Payer: Self-pay

## 2019-12-27 ENCOUNTER — Other Ambulatory Visit: Payer: Self-pay

## 2020-01-01 ENCOUNTER — Ambulatory Visit: Payer: Self-pay

## 2020-01-01 NOTE — Progress Notes (Signed)
Mental Health Therapist Progress Note   Name: Jorgeluis Gurganus  Interpreter (in person) used  Total time: 30  Type of Service: Individual Outpatient Mental Health Therapy  OBJECTIVE:  Mood: Anxious and Affect: Labile Risk of harm to self or others: No plan to harm self or others  DIAGNOSIS:   GOALS ADDRESSED:  Patient will: 1.  Reduce symptoms of: depression  2.  Increase knowledge and/or ability of: coping skills  3.  Demonstrate ability to: Increase healthy adjustment to current life circumstances  INTERVENTIONS: Interventions utilized:  Supportive Counseling and Link to [employer] met with patient for outpatient mental health individual therapy to include ongoing assessment, support, and reinforcement.  Therapist allowed patient to "check in" since previous session; asking patient to share any positive coping skills they may have used over the previous week, along with any challenges faced.  Therapist provided supportive listening as patient processed their thoughts, emotional responses, and behaviors surrounding several stressors. Patient expresses great frustration and feelings of fear related to his financial situation, including his fear of becoming homeless.  EFFECTIVENESS/PLAN: Client was alert, oriented x3, with no SI, HI, or symptoms of psychosis (risk low).  Overall, patient engaged appropriately with therapist, benefiting from supportive listening and exploration of feelings.  Next session recommended in one to two weeks.   Hughes Better, LCSW

## 2020-01-03 ENCOUNTER — Other Ambulatory Visit: Payer: Self-pay

## 2020-01-03 ENCOUNTER — Ambulatory Visit: Payer: Self-pay

## 2020-01-03 DIAGNOSIS — F411 Generalized anxiety disorder: Secondary | ICD-10-CM

## 2020-01-03 NOTE — Progress Notes (Unsigned)
Mental Health Therapist Progress Note   Name: Tyler Horn  Total time: 60  Type of Service: Individual Outpatient Mental Health Therapy  OBJECTIVE:  Mood: Euthymic and Affect: Appropriate Risk of harm to self or others: Intention to act on plan to harm others No plan to harm self or others  DIAGNOSIS:   GOALS ADDRESSED:  Patient will: 1.  Reduce symptoms of: anxiety  2.  Increase knowledge and/or ability of: coping skills  3.  Demonstrate ability to: Increase healthy adjustment to current life circumstances  INTERVENTIONS: Interventions utilized:  Supportive Counseling Therapist met with patient for outpatient mental health individual therapy to include ongoing assessment, support, and reinforcement.  Therapist allowed patient to "check in" since previous session; asking patient to share any positive coping skills they may have used over the previous week, along with any challenges faced.  Therapist provided supportive listening as patient processed their thoughts, emotional responses, and behaviors surrounding several stressors. Patient shared some of his family history, discussing family dynamics and the overall lack of support he feels from his family.   EFFECTIVENESS/PLAN: Client was alert, oriented x3, with no SI, HI, or symptoms of psychosis (risk low).  Client was pleasant and friendly, engaging openly and appropriately with therapist, benefiting from supportive listening and exploration of feelings.  Next session recommended in one to two weeks.   Hughes Better, LCSW

## 2020-01-08 ENCOUNTER — Other Ambulatory Visit: Payer: Self-pay

## 2020-01-08 ENCOUNTER — Ambulatory Visit: Payer: Self-pay

## 2020-01-08 DIAGNOSIS — F411 Generalized anxiety disorder: Secondary | ICD-10-CM

## 2020-01-08 NOTE — Progress Notes (Unsigned)
Mental Health Therapist Progress Note   Name: Arianna Delsanto  Total time: 30  Type of Service: Individual Outpatient Mental Health Therapy  OBJECTIVE:  Mood: Euthymic and Affect: Appropriate Risk of harm to self or others: No plan to harm self or others  DIAGNOSIS:   GOALS ADDRESSED:  Patient will: 1.  Reduce symptoms of: anxiety  2.  Increase knowledge and/or ability of: coping skills  3.  Demonstrate ability to: Increase healthy adjustment to current life circumstances  INTERVENTIONS: Interventions utilized:  Supportive Counseling Therapist met with patient for outpatient mental health individual therapy to include ongoing assessment, support, and reinforcement.  Therapist allowed patient to "check in" since previous session; asking patient to share any positive coping skills they may have used over the previous week, along with any challenges faced.  Therapist provided supportive listening as patient processed their thoughts, emotional responses, and behaviors surrounding several stressors.   Patient shared that he is beginning to feel more stable; he recently was hired for a full-time position at [employer] as well as continuing to work part-time with his prior company.  . Patient reports that he is going to visit his mother and step-father this coming weeek-end and feels positively about this.  Patient shared that he believes that the majority of his mental health issues began in 2005 when he experienced a trauma; patient was agreeable to speak more about this in upcoming sessions. EFFECTIVENESS/PLAN: Client was alert, oriented x3, with no SI, HI, or symptoms of psychosis (risk low).  Client was pleasant and friendly, engaging openly and appropriately with therapist, benefiting from supportive listening and exploration of feelings.  Next session recommended in one to two weeks.   Hughes Better, LCSW

## 2020-01-15 ENCOUNTER — Other Ambulatory Visit: Payer: Self-pay

## 2020-01-15 ENCOUNTER — Ambulatory Visit: Payer: Self-pay

## 2020-01-24 ENCOUNTER — Other Ambulatory Visit: Payer: Self-pay

## 2020-01-24 ENCOUNTER — Ambulatory Visit: Payer: Self-pay

## 2020-01-31 ENCOUNTER — Telehealth (HOSPITAL_COMMUNITY): Payer: No Payment, Other | Admitting: Psychiatry

## 2020-01-31 ENCOUNTER — Other Ambulatory Visit: Payer: Self-pay

## 2020-02-04 ENCOUNTER — Other Ambulatory Visit: Payer: Self-pay

## 2020-02-04 DIAGNOSIS — Z21 Asymptomatic human immunodeficiency virus [HIV] infection status: Secondary | ICD-10-CM

## 2020-02-04 DIAGNOSIS — A515 Early syphilis, latent: Secondary | ICD-10-CM

## 2020-02-05 LAB — T-HELPER CELL (CD4) - (RCID CLINIC ONLY)
CD4 % Helper T Cell: 30 % — ABNORMAL LOW (ref 33–65)
CD4 T Cell Abs: 706 /uL (ref 400–1790)

## 2020-02-06 LAB — RPR TITER: RPR Titer: 1:4 {titer} — ABNORMAL HIGH

## 2020-02-06 LAB — COMPREHENSIVE METABOLIC PANEL
AG Ratio: 1.6 (calc) (ref 1.0–2.5)
ALT: 8 U/L — ABNORMAL LOW (ref 9–46)
AST: 14 U/L (ref 10–35)
Albumin: 4.5 g/dL (ref 3.6–5.1)
Alkaline phosphatase (APISO): 115 U/L (ref 35–144)
BUN: 21 mg/dL (ref 7–25)
CO2: 28 mmol/L (ref 20–32)
Calcium: 9.3 mg/dL (ref 8.6–10.3)
Chloride: 106 mmol/L (ref 98–110)
Creat: 1.12 mg/dL (ref 0.70–1.33)
Globulin: 2.8 g/dL (calc) (ref 1.9–3.7)
Glucose, Bld: 78 mg/dL (ref 65–99)
Potassium: 3.9 mmol/L (ref 3.5–5.3)
Sodium: 142 mmol/L (ref 135–146)
Total Bilirubin: 0.5 mg/dL (ref 0.2–1.2)
Total Protein: 7.3 g/dL (ref 6.1–8.1)

## 2020-02-06 LAB — HIV-1 RNA QUANT-NO REFLEX-BLD
HIV 1 RNA Quant: <20 copies/mL
HIV-1 RNA Quant, Log: <1.3 Log copies/mL

## 2020-02-06 LAB — RPR: RPR Ser Ql: REACTIVE — AB

## 2020-02-06 LAB — FLUORESCENT TREPONEMAL AB(FTA)-IGG-BLD: Fluorescent Treponemal ABS: REACTIVE — AB

## 2020-02-19 ENCOUNTER — Ambulatory Visit (INDEPENDENT_AMBULATORY_CARE_PROVIDER_SITE_OTHER): Payer: Self-pay | Admitting: Family

## 2020-02-19 ENCOUNTER — Other Ambulatory Visit: Payer: Self-pay

## 2020-02-19 ENCOUNTER — Encounter: Payer: Self-pay | Admitting: Family

## 2020-02-19 ENCOUNTER — Ambulatory Visit: Payer: Self-pay

## 2020-02-19 VITALS — BP 130/83 | HR 65 | Temp 97.9°F | Wt 220.0 lb

## 2020-02-19 DIAGNOSIS — F411 Generalized anxiety disorder: Secondary | ICD-10-CM

## 2020-02-19 DIAGNOSIS — Z0000 Encounter for general adult medical examination without abnormal findings: Secondary | ICD-10-CM

## 2020-02-19 DIAGNOSIS — A515 Early syphilis, latent: Secondary | ICD-10-CM

## 2020-02-19 DIAGNOSIS — Z21 Asymptomatic human immunodeficiency virus [HIV] infection status: Secondary | ICD-10-CM

## 2020-02-19 MED ORDER — BICTEGRAVIR-EMTRICITAB-TENOFOV 50-200-25 MG PO TABS: 1.0000 | ORAL_TABLET | Freq: Every day | ORAL | 4 refills | Status: DC

## 2020-02-19 NOTE — Progress Notes (Signed)
Subjective:    Patient ID: Tyler Horn, male    DOB: 20-Oct-1960, 59 y.o.   MRN: [id number]  Chief Complaint  Patient presents with  . Follow-up     HPI:  Tyler Horn is a 59 y.o. male with HIV disease who was last seen in the office on 12/19/2019 with good adherence and tolerance to his ART regimen of nevirapine and Truvada.  He was changed to [facility] to simplify his regimen.  Blood work at the time showed a viral load that was undetectable with CD4 count of 681.  Most recent blood work completed on 02/04/2020 with viral load that remains undetectable and CD4 count of 706.  Here today for routine follow-up.  Tyler Horn is deaf and requires sign language interpreter who is present to aid in communication.  Tyler Horn continues to take his Biktarvy as prescribed with no adverse side effects or missed doses since his last office visit.  Overall feeling well today.  He has concerns about his most recent syphilis titer which was 1: 16 and questions need for additional penicillin.  Otherwise without concern. Denies fevers, chills, night sweats, headaches, changes in vision, neck pain/stiffness, nausea, diarrhea, vomiting, lesions or rashes.  Tyler Horn has no problems obtaining his medication from the pharmacy and has coverage through [employer].  Has recently obtained employment and will have insurance starting October 1.  Encouraged to complete financial assistance renewal just in case.  Denies feelings of being down, depressed, or hopeless recently.  He is sleeping better with his regimen of Lexapro and Seroquel.  No recreational or illicit drug use, tobacco use, or alcohol consumption.  He is not currently sexually active.    No Known Allergies    Outpatient Medications Prior to Visit  Medication Sig Dispense Refill  . escitalopram (LEXAPRO) 10 MG tablet Take 10 mg by mouth daily.    . hydrOXYzine (ATARAX/VISTARIL) 25 MG tablet Take 1 tablet (25 mg total) by mouth 3 (three) times daily as  needed for anxiety. 30 tablet 1  . prazosin (MINIPRESS) 1 MG capsule Take 1 capsule (1 mg total) by mouth at bedtime. 30 capsule 1  . QUEtiapine (SEROQUEL XR) 200 MG 24 hr tablet Take 200 mg by mouth at bedtime.    . bictegravir-emtricitabine-tenofovir AF (BIKTARVY) 50-200-25 MG TABS tablet Take 1 tablet by mouth daily. 30 tablet 4  . mirtazapine (REMERON) 15 MG tablet Take 1 tablet (15 mg total) by mouth at bedtime. 30 tablet 1  . emtricitabine-tenofovir AF (DESCOVY) 200-25 MG tablet Take 1 tablet by mouth daily. (Patient not taking: Reported on 02/19/2020) 30 tablet 0  . nevirapine (VIRAMUNE) 200 MG tablet Take 1 tablet (200 mg total) by mouth every 12 (twelve) hours. (Patient not taking: Reported on 02/19/2020) 60 tablet 0   No facility-administered medications prior to visit.     Past Medical History:  Diagnosis Date  . Anxiety   . Depression   . HIV (human immunodeficiency virus infection) (HCC)   . HIV (human immunodeficiency virus infection) (HCC)      Past Surgical History:  Procedure Laterality Date  . CATARACT EXTRACTION Left        Review of Systems  Constitutional: Negative for appetite change, chills, fatigue, fever and unexpected weight change.  Eyes: Negative for visual disturbance.  Respiratory: Negative for cough, chest tightness, shortness of breath and wheezing.   Cardiovascular: Negative for chest pain and leg swelling.  Gastrointestinal: Negative for abdominal pain, constipation, diarrhea, nausea and vomiting.  Genitourinary: Negative for dysuria, flank pain, frequency, genital sores, hematuria and urgency.  Skin: Negative for rash.  Allergic/Immunologic: Negative for immunocompromised state.  Neurological: Negative for dizziness and headaches.      Objective:    BP 130/83   Pulse 65   Temp 97.9 F (36.6 C)   Wt 220 lb (99.8 kg)   BMI 26.78 kg/m  Nursing note and vital signs reviewed.  Physical Exam Constitutional:      General: He is not in  acute distress.    Appearance: He is well-developed.  Eyes:     Conjunctiva/sclera: Conjunctivae normal.  Cardiovascular:     Rate and Rhythm: Normal rate and regular rhythm.     Heart sounds: Normal heart sounds. No murmur heard.  No friction rub. No gallop.   Pulmonary:     Effort: Pulmonary effort is normal. No respiratory distress.     Breath sounds: Normal breath sounds. No wheezing or rales.  Chest:     Chest wall: No tenderness.  Abdominal:     General: Bowel sounds are normal.     Palpations: Abdomen is soft.     Tenderness: There is no abdominal tenderness.  Musculoskeletal:     Cervical back: Neck supple.  Lymphadenopathy:     Cervical: No cervical adenopathy.  Skin:    General: Skin is warm and dry.     Findings: No rash.  Neurological:     Mental Status: He is alert and oriented to person, place, and time.  Psychiatric:        Behavior: Behavior normal.        Thought Content: Thought content normal.        Judgment: Judgment normal.      No flowsheet data found.     Assessment & Plan:    Patient Active Problem List   Diagnosis Date Noted  . Healthcare maintenance 12/19/2019  . 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 PTSD (post-traumatic stress disorder) 11/28/2019  . 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 Diarrhea   . Anxiety state   . Major depressive disorder, recurrent, severe without psychotic behavior (HCC) 07/13/2017  . Major depressive disorder, single episode, severe without psychotic features (HCC)   . MDD (major depressive disorder), recurrent, severe, with psychosis (HCC) 07/31/2014  . Moderate benzodiazepine use disorder (HCC) 07/31/2014  . GAD (generalized anxiety disorder) 07/31/2014  . HIV (human immunodeficiency virus infection) (HCC)   . MDD (major depressive disorder), severe (HCC) 07/30/2014  . Delusional disorder (HCC) 07/30/2014  . Suicidal ideation 07/30/2014  .  Hallucinations   . Depression, major, recurrent (HCC) 10/03/2013  . Anxiety, generalized 03/26/2013     Problem List Items Addressed This Visit      Other   GAD (generalized anxiety disorder)    Generalized anxiety disorder and depression significantly improved since obtaining employment and with current dose of Lexapro and Seroquel.  Continue to monitor.      Relevant Medications   escitalopram (LEXAPRO) 10 MG tablet   HIV (human immunodeficiency virus infection) (HCC) - Primary    Tyler Horn has well-controlled HIV disease with good adherence and tolerance to his ART regimen of Biktarvy.  No signs/symptoms of opportunistic infection or progressive HIV disease.  We reviewed his lab work and discussed the plan of care.  Continue current dose of Biktarvy.  Plan for follow-up in 3 months or sooner if needed with lab work 1 to 2 weeks prior to appointment.  Relevant Medications   bictegravir-emtricitabine-tenofovir AF (BIKTARVY) 50-200-25 MG TABS tablet   Other Relevant Orders   HIV-1 RNA quant-no reflex-bld   T-helper cell (CD4)- ([facility] clinic only)   Comprehensive metabolic panel   Healthcare maintenance     Discussed importance of safe sexual practice to reduce risk of STI.  Condoms declined.      Early syphilis, latent    Previously treated for early latent syphilis with titer down from 1: 16 to now 1: 4.  Discussed that this is successful treatment of syphilis with no additional medication needed at the present time.  Currently asymptomatic.  Continue to monitor RPR periodically.      Relevant Medications   bictegravir-emtricitabine-tenofovir AF (BIKTARVY) 50-200-25 MG TABS tablet   Other Relevant Orders   RPR       I have discontinued Tyler Horn's emtricitabine-tenofovir AF, nevirapine, and mirtazapine. I am also having him maintain his prazosin, hydrOXYzine, QUEtiapine, escitalopram, and bictegravir-emtricitabine-tenofovir AF.   Meds ordered this encounter   Medications  . bictegravir-emtricitabine-tenofovir AF (BIKTARVY) 50-200-25 MG TABS tablet    Sig: Take 1 tablet by mouth daily.    Dispense:  30 tablet    Refill:  4    Order Specific Question:   Supervising Provider    Answer:   Judyann Munson [[id number]]     Follow-up: Return in 3 months (on 05/21/2020), or if symptoms worsen or fail to improve.   Marcos Eke, MSN, FNP-C Nurse Practitioner [facility] for Infectious Disease [facility] Main number: [phone]

## 2020-02-19 NOTE — Assessment & Plan Note (Signed)
Generalized anxiety disorder and depression significantly improved since obtaining employment and with current dose of Lexapro and Seroquel.  Continue to monitor.

## 2020-02-19 NOTE — Assessment & Plan Note (Signed)
Previously treated for early latent syphilis with titer down from 1: 16 to now 1: 4.  Discussed that this is successful treatment of syphilis with no additional medication needed at the present time.  Currently asymptomatic.  Continue to monitor RPR periodically.

## 2020-02-19 NOTE — Assessment & Plan Note (Signed)
   Discussed importance of safe sexual practice to reduce risk of STI.  Condoms declined. 

## 2020-02-19 NOTE — Patient Instructions (Signed)
Nice to see you.  Continue to take your Sundance daily as prescribed.   Refills have been sent to the pharmacy for you.  Let us know about your insurance.  Plan for follow up in 3 months or sooner if needed with lab work 1-2 weeks prior to your appointment.   Have a great day and stay safe!

## 2020-02-19 NOTE — Assessment & Plan Note (Signed)
Tyler Horn has well-controlled HIV disease with good adherence and tolerance to his ART regimen of Biktarvy.  No signs/symptoms of opportunistic infection or progressive HIV disease.  We reviewed his lab work and discussed the plan of care.  Continue current dose of Biktarvy.  Plan for follow-up in 3 months or sooner if needed with lab work 1 to 2 weeks prior to appointment.

## 2020-02-21 ENCOUNTER — Other Ambulatory Visit: Payer: Self-pay

## 2020-02-21 ENCOUNTER — Ambulatory Visit: Payer: Self-pay | Admitting: *Deleted

## 2020-02-21 DIAGNOSIS — R600 Localized edema: Secondary | ICD-10-CM

## 2020-02-21 NOTE — Progress Notes (Signed)
Pt in to clinic with coworker. Pt reports wearing bilateral knee compression sleeves yesterday and having lower leg edema worsen. Reports today it is improved, but 1+ edema still noted BLE. He reports his MD recommended compression hose which are supposed to be delivered today to his home. Applied ace bandages in place of compression hose for today while he is at work as he feels his legs are heavy and he has to pull himself up the ladders as opposed to stepping up them with his feet/legs pushing him up the ladder.  Advised pt that hose should be snug, not loose, but he should not experience any numbness in toes/feet. Measure calf if he hasn't already to ensure correct size was ordered. Pt verbalizes understanding and agreement with plan of care. To return to clinic tomorrow if any additional concerns with compression hose or edema.

## 2020-03-31 ENCOUNTER — Encounter: Payer: Self-pay | Admitting: Family

## 2020-04-14 ENCOUNTER — Telehealth: Payer: Self-pay | Admitting: *Deleted

## 2020-04-14 NOTE — Telephone Encounter (Signed)
RN notified this AM of pt reporting to work c/o headache and difficulty breathing with his mask on. Supervisor that spoke with pt felt this was congestion related, not ShOB related.  Pt was sent home to wait for my phone call and further recommendations. Pt is fully vaccinated.   Pt is deaf and preferred text for our communication today. He reported all symptoms had resolved just a few hours after they started today. He had a temp of 100 he measured at home. Fever broke about noon per pt. He denied any congestion, rhinorrhea, HA, ShOB. Only current c/o was dizziness upon standing from sitting or changing positions that resolves itself quickly. Sts he is drinking a lot of gatorade and water and urine is pale yellow, urinating at least every 3-4 hours. Could be BP related or vertigo? Advised pt to contact pcp for any concerns with sx that continue or recur as I do not believe sx are covid related or concerning for need to quarantine currently. Also advised pt he could not return on-site until he is fever free for 24 hours, so he plans to RTW 04/16/20. Pt reminded to contact RN if any symptoms recur or new sx start to reevaluate plan. Pt reported understanding and agreement. HR made aware as well.

## 2020-04-15 ENCOUNTER — Encounter: Payer: Self-pay | Admitting: *Deleted

## 2020-04-15 NOTE — Telephone Encounter (Signed)
Noted.  Please follow up with patient today if new or worsening symptoms schedule for video visit with me.  Medication list needs to be reconciled with patient also.  Verify if patient received 2 moderna covid vaccinations as only one documented in Epic.  Last labs per epic 02/04/2020.

## 2020-04-18 NOTE — Telephone Encounter (Signed)
Noted pending vaccine covid dates from HR records and patient RTW all symptoms resolved VSS.  I also spoke with patient in clinic yesterday briefly.  Respirations even and unlabored, skin warm dry and pink, gait sure and steady in clinic, in/out of chair without difficulty

## 2020-04-18 NOTE — Telephone Encounter (Signed)
Pt RTW on 04/16/20. Seen by RN in clinic 8/19. VSS. 113/79, p89, t97.25F. Denied any current sx. Reported all sx resolved within 4-5 hours the first day of sx. He denied any current questions or concerns. Forgot to question vaccination record. Have requested vaccination record of dates provided to HR to update immunizations in [facility].

## 2020-04-25 ENCOUNTER — Telehealth: Payer: Self-pay | Admitting: *Deleted

## 2020-04-25 NOTE — Telephone Encounter (Signed)
RN notified that pt called out of work today reporting fever and that he was going to [facility].  RN texted with pt (his contact preference 2/2 deafness) and he reported "bad sinus" and "respiratory infection" per UC. Unsure of name of facility. No records located in [facility]/Care Everywhere. Reports Covid testing there negative yesterday, which was also the first day of symptoms. Antibiotics started by UC.   Due to fever, respiratory infection, sinus sx, RN recommended repeat Covid testing during sx Days 3-5. Pt is agreeable. Appt made for first available testing Sunday 8/29 at 1245 at [facility]   Pt fully vaccinated. To begin quarantining now and will need to quarantine through Wed 9/1 and will be eligible to RTW 9/2 if sx improving, no fever for at least 24 hours without antipyretics and negative test. If positive, will extend to 10 day quarantine.   Plan to f/u Tues 8/31 for anticipated results.   Reviewed possible Covid sx including cough, ShOB, sinusitis sx, sore throat, fever/chills, body aches, fatigue, loss of taste/smell, GI sx n/v/d. Also reviewed same day/emergent eval/ER precautions of dizziness/syncope, confusion, blue tint to lips/face, severe ShOB/difficulty breathing.    Pt reported understanding and agreement with plan of care. No further questions/concerns at this time. Pt reminded to contact clinic with test results, any changes in sx or questions/concerns.

## 2020-04-25 NOTE — Telephone Encounter (Signed)
Noted agree with plan of care per CDC and [employer] covid policy

## 2020-04-27 ENCOUNTER — Encounter: Payer: Self-pay | Admitting: *Deleted

## 2020-04-27 DIAGNOSIS — G609 Hereditary and idiopathic neuropathy, unspecified: Secondary | ICD-10-CM | POA: Insufficient documentation

## 2020-04-27 DIAGNOSIS — B009 Herpesviral infection, unspecified: Secondary | ICD-10-CM | POA: Insufficient documentation

## 2020-04-27 DIAGNOSIS — B182 Chronic viral hepatitis C: Secondary | ICD-10-CM

## 2020-04-27 DIAGNOSIS — B029 Zoster without complications: Secondary | ICD-10-CM | POA: Insufficient documentation

## 2020-04-27 HISTORY — DX: Chronic viral hepatitis C: B18.2

## 2020-04-27 NOTE — Telephone Encounter (Signed)
Message left for patient verifying if he went to [facility] at 1245 today for covid testing.  His supervisor contacted me to verify if he received test results and when he can return to work.  I notified supervisor I would call patient and verify if he was cleared to return to work as history of fever and URI symptoms in the past week and repeat covid testing pending as initially tested on day 1 of symptoms rapid test and repeat testing scheduled for today and since patient works in [employer] symptoms improving, fever free off antipyretics 24 hours prior to return to work and no vomiting/diarrhea in previous 24 hours.

## 2020-04-28 ENCOUNTER — Other Ambulatory Visit: Payer: Self-pay

## 2020-04-28 NOTE — Telephone Encounter (Signed)
Noted covid testing scheduled for tomorrow at [facility] location.  I replied to patient text encouraging him to keep his therapist appt today and that RN Rolly Salter would assist him with scheduling his covid test appt.  Patient did not reply to me.

## 2020-04-28 NOTE — Telephone Encounter (Signed)
Pt texted both NP and RN at [phone] today reporting missing his Covid test scheduled yesterday citing depression and anxiety and that he is seeing his therapist virtually today. Encouraged pt to keep that appt. No appts for testing available today. Only available appt for tomorrow scheduled, [facility] at 1250. Pt notified by text of the same.

## 2020-04-29 NOTE — Telephone Encounter (Signed)
Patient verified he has covid testing today and awaiting results

## 2020-05-01 ENCOUNTER — Encounter: Payer: Self-pay | Admitting: Registered Nurse

## 2020-05-01 ENCOUNTER — Telehealth: Payer: Self-pay | Admitting: Registered Nurse

## 2020-05-01 NOTE — Telephone Encounter (Signed)
Patient notified me sent screen shot of PCR test results covid negative.  If no new symptoms, symptoms improving and no fever/chills/vomiting or diarrhea eligible to RTW today.

## 2020-05-01 NOTE — Telephone Encounter (Signed)
Patient reported all symptoms resolved and he was instructed he could return to work today but to talk with supervisor prior to returning to work since shift started at 0600.  HR Tim notified patient cleared to RTW by RN Rolly Salter at 1300.  Late entry.

## 2020-05-01 NOTE — Telephone Encounter (Signed)
Pt notified RN and NP by text of negative covid test result this morning. Pt denies any fever, chills, vomiting or diarrhea in the past 24 hours. He is cleared to return to work today. NP encouraged him to contact his supervisor to discuss schedule as he is scheduled on first shift. HR made aware as well.

## 2020-05-01 NOTE — Telephone Encounter (Signed)
Patient notified me at 66 on 29 Apr 2020 that he had [facility] and awaiting results.  Please follow up with patient to see if he has been notified of results.  Nothing in Epic/care everywhere.

## 2020-05-14 ENCOUNTER — Encounter: Payer: Self-pay | Admitting: Family

## 2020-06-13 ENCOUNTER — Ambulatory Visit: Payer: Self-pay | Admitting: *Deleted

## 2020-06-13 ENCOUNTER — Other Ambulatory Visit: Payer: Self-pay

## 2020-06-13 DIAGNOSIS — R109 Unspecified abdominal pain: Secondary | ICD-10-CM

## 2020-06-13 DIAGNOSIS — R1030 Lower abdominal pain, unspecified: Secondary | ICD-10-CM

## 2020-06-13 LAB — POCT URINALYSIS DIPSTICK
Bilirubin, UA: NEGATIVE
Glucose, UA: NEGATIVE mg/dL
Ketones, POC UA: NEGATIVE mg/dL
Leukocytes, UA: NEGATIVE
Nitrite, UA: NEGATIVE
Specific Gravity, UA: 1.03 (ref 1.005–1.030)
Urobilinogen, UA: 0.2 E.U./dL
pH, UA: 6 (ref 5.0–8.0)

## 2020-06-13 NOTE — Progress Notes (Signed)
Pt in to clinic using Notes on iPhone to report "physical pain in my belly and back. Pain in both ends. Especially with #2. And pain when peeing" RN utilizing [employer] transcribing app to communicate. Asks pt if he feels sx are related to a kidney infection, muscle pain, or something else. He endorses kidney infection concern. Reports only able to urinate a little at a time. UA dip performed. Trace protein and small blood, strongly malodorous.   Advised pt I recommended he see his primary care. Offered to call office to schedule for him. He reports having an appointment scheduled for 10/19 and having medicine at home for the sx, though he could not recall what they are. Declines having RN attempt to schedule with pcp today. Reports he will go to urgent care this weekend if sx worsen and see his MD Tuesday.

## 2020-06-16 MED ORDER — QUETIAPINE FUMARATE 200 MG PO TABS: 200.0000 mg | ORAL_TABLET | Freq: Every day | ORAL | 1 refills | Status: DC

## 2020-06-16 NOTE — Progress Notes (Signed)
Pt confirmed by text that he had no symptoms over the weekend and is fine now. Plans to f/u with pcp at scheduled appt 10/26.

## 2020-06-17 NOTE — Progress Notes (Signed)
Noted symptoms resolved feeling better PCP appt scheduled

## 2020-06-24 ENCOUNTER — Other Ambulatory Visit: Payer: Self-pay

## 2020-06-30 ENCOUNTER — Other Ambulatory Visit: Payer: Self-pay

## 2020-06-30 MED ORDER — QUETIAPINE FUMARATE 200 MG PO TABS
200.0000 mg | ORAL_TABLET | Freq: Every day | ORAL | 1 refills | Status: DC
Start: 2020-06-30 — End: 2020-08-25

## 2020-06-30 MED ORDER — QUETIAPINE FUMARATE 200 MG PO TABS
200.0000 mg | ORAL_TABLET | Freq: Every day | ORAL | 1 refills | Status: DC
Start: 2020-06-30 — End: 2020-06-30

## 2020-07-06 ENCOUNTER — Other Ambulatory Visit: Payer: Self-pay | Admitting: Family

## 2020-07-06 DIAGNOSIS — Z21 Asymptomatic human immunodeficiency virus [HIV] infection status: Secondary | ICD-10-CM

## 2020-07-08 ENCOUNTER — Telehealth: Payer: Self-pay

## 2020-07-08 ENCOUNTER — Other Ambulatory Visit: Payer: Self-pay

## 2020-07-08 ENCOUNTER — Encounter: Payer: Self-pay | Admitting: Family

## 2020-07-08 ENCOUNTER — Ambulatory Visit (INDEPENDENT_AMBULATORY_CARE_PROVIDER_SITE_OTHER): Payer: Self-pay | Admitting: Family

## 2020-07-08 VITALS — BP 143/78 | HR 90 | Wt 202.0 lb

## 2020-07-08 DIAGNOSIS — Z0000 Encounter for general adult medical examination without abnormal findings: Secondary | ICD-10-CM

## 2020-07-08 DIAGNOSIS — R634 Abnormal weight loss: Secondary | ICD-10-CM

## 2020-07-08 DIAGNOSIS — Z21 Asymptomatic human immunodeficiency virus [HIV] infection status: Secondary | ICD-10-CM

## 2020-07-08 DIAGNOSIS — H539 Unspecified visual disturbance: Secondary | ICD-10-CM

## 2020-07-08 DIAGNOSIS — F333 Major depressive disorder, recurrent, severe with psychotic symptoms: Secondary | ICD-10-CM

## 2020-07-08 DIAGNOSIS — Z113 Encounter for screening for infections with a predominantly sexual mode of transmission: Secondary | ICD-10-CM

## 2020-07-08 NOTE — Patient Instructions (Signed)
Nice to see you.  We will continue your Biktarvy.  We will check your lab work today.  We will check your financial status and work on getting you over to [employer] [facility] or Internal Medicine  Continue to work with [employer].   Plan for follow up pending blood work results.

## 2020-07-08 NOTE — Telephone Encounter (Signed)
Per FNP CMA called [facility] to schedule patient new patient appointment. Per scheduling patient will be able to meet financial counselor regarding assistance. Office is requesting referral be placed in Epic before scheduling.  They will contact patient with appointment information once referral is received.

## 2020-07-08 NOTE — Progress Notes (Signed)
Subjective:    Patient ID: Tyler Horn, male    DOB: 10-26-1960, 59 y.o.   MRN: [id number]  Chief Complaint  Patient presents with  . Follow-up    balance concerns and rt eye sight, lt eye issues, weightloss,     HPI:  Tyler Horn is a 59 y.o. male with HIV disease who was last seen in the office on 02/19/2020 with good adherence and tolerance to his ART regimen of Biktarvy.  Viral load at the time was found to be undetectable with CD4 count of 706.  He was working full-time and working with [employer].  Here today for routine follow-up.  Tyler Horn continues to take his Biktarvy daily as prescribed with no adverse side effects or missed doses since his last office visit.  He has several concerns today including unexpected weight loss, losing his balance and changes in vision.  Severity of the symptoms have resulted in him stopping work.  He has been eating but continues to lose weight of unclear origin.  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  Denies any trauma or injury.  No fevers, chills, night sweats, neck pain, rashes, or lesions.  Tyler Horn has no problems obtaining his medication from the pharmacy.  Has had increasing levels of anxiety and denies feelings of being down, depressed, or hopeless.  No recreational or illicit drug use, tobacco use, or alcohol consumption.  Now receiving Social Security disability.  His last day of work was 06/26/2020.  He is not able to drive anymore.  Tyler Horn is hearing impaired and a sign language interpreter is present via computer to aid in communication.  Wt Readings from Last 3 Encounters:  07/08/20 202 lb (91.6 kg)  02/19/20 220 lb (99.8 kg)  12/19/19 226 lb (102.5 kg)      No Known Allergies    Outpatient Medications Prior to Visit  Medication Sig Dispense Refill  . clonazePAM (KLONOPIN) 0.5 MG tablet Take by mouth.    . escitalopram (LEXAPRO) 20 MG  tablet Take by mouth.    . QUEtiapine (SEROQUEL) 200 MG tablet Take 1 tablet (200 mg total) by mouth at bedtime. 30 tablet 1  . BIKTARVY 50-200-25 MG TABS tablet TAKE 1 TABLET BY MOUTH DAILY 30 tablet 0  . hydrOXYzine (ATARAX/VISTARIL) 25 MG tablet Take 1 tablet (25 mg total) by mouth 3 (three) times daily as needed for anxiety. (Patient not taking: Reported on 07/08/2020) 30 tablet 1  . nevirapine (VIRAMUNE) 200 MG tablet Take by mouth. (Patient not taking: Reported on 07/08/2020)    . prazosin (MINIPRESS) 1 MG capsule Take 1 capsule (1 mg total) by mouth at bedtime. (Patient not taking: Reported on 07/08/2020) 30 capsule 1   No facility-administered medications prior to visit.     Past Medical History:  Diagnosis Date  . Anxiety   . Depression   . HIV (human immunodeficiency virus infection) (HCC)   . HIV (human immunodeficiency virus infection) (HCC)      Past Surgical History:  Procedure Laterality Date  . CATARACT EXTRACTION Left        Review of Systems  Constitutional: Positive for unexpected weight change. Negative for appetite change, chills, fatigue and fever.  Eyes: Positive for itching and visual disturbance. Negative for photophobia, pain, discharge and redness.  Respiratory: Negative for cough, chest tightness, shortness of breath and wheezing.   Cardiovascular: Negative for chest pain and leg  swelling.  Gastrointestinal: Negative for abdominal pain, constipation, diarrhea, nausea and vomiting.  Genitourinary: Negative for dysuria, flank pain, frequency, genital sores, hematuria and urgency.  Skin: Negative for rash.  Allergic/Immunologic: Negative for immunocompromised state.  Neurological: Negative for dizziness and headaches.  Psychiatric/Behavioral: Negative for agitation, confusion, dysphoric mood, self-injury and sleep disturbance. The patient is nervous/anxious.       Objective:    BP (!) 143/78   Pulse 90   Wt 202 lb (91.6 kg)   BMI 24.59 kg/m    Nursing note and vital signs reviewed.  Physical Exam Constitutional:      General: He is not in acute distress.    Appearance: He is well-developed.  Eyes:     Extraocular Movements: Extraocular movements intact.     Pupils: Pupils are equal, round, and reactive to light.  Cardiovascular:     Rate and Rhythm: Normal rate and regular rhythm.     Heart sounds: Normal heart sounds.  Pulmonary:     Effort: Pulmonary effort is normal.     Breath sounds: Normal breath sounds.  Skin:    General: Skin is warm and dry.  Neurological:     Mental Status: He is alert and oriented to person, place, and time.  Psychiatric:        Mood and Affect: Mood is anxious.        Behavior: Behavior normal.        Thought Content: Thought content normal.        Judgment: Judgment is impulsive.      No flowsheet data found.     Assessment & Plan:     Patient Active Problem List   Diagnosis Date Noted  . Vision changes 07/09/2020  . Weight loss 07/09/2020  . Chronic hepatitis C (HCC) 04/27/2020  . Idiopathic peripheral neuropathy 04/27/2020  . Herpes simplex 04/27/2020  . Herpes zoster 04/27/2020  . Healthcare maintenance 12/19/2019  . 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 Post-traumatic stress disorder, unspecified 11/28/2019  . 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 Diarrhea   . Anxiety state   . Severe recurrent major depression without psychotic features (HCC) 07/13/2017  . Major depressive disorder, recurrent, severe without psychotic behavior (HCC) 07/13/2017  . MDD (major depressive disorder), recurrent, severe, with psychosis (HCC) 07/31/2014  . Benzodiazepine misuse 07/31/2014  . Human immunodeficiency virus infection (HCC)   . Severe major depression (HCC) 07/30/2014  . Delusional disorder (HCC) 07/30/2014  . Suicidal thoughts 07/30/2014  . Hallucinations   . Major depressive disorder, recurrent episode  (HCC) 10/03/2013  . Anxiety, generalized 03/26/2013  . Generalized anxiety disorder 03/26/2013     Problem List Items Addressed This Visit      Other   MDD (major depressive disorder), recurrent, severe, with psychosis (HCC)    After speaking with his case manager I am concerned he may have exacerbation of his depression leading to psychosis resulting in these describes symptoms and decreased oral intake and weight loss.  Case management is working on establishing an appointment to review his information and determine the best direction for his care.  Continues to need psychiatry for management of medication and mood.      Human immunodeficiency virus infection (HCC) - Primary    Tyler Horn continues to have well-controlled HIV disease with good adherence and tolerance to his ART regimen of Biktarvy.  No signs/symptoms of opportunistic infection or progressive HIV disease.  We reviewed previous lab  work and discussed plan of care.  Check blood work today.  Continue current dose of Biktarvy.  Plan for follow-up in 2 months or sooner if needed.      Relevant Medications   bictegravir-emtricitabine-tenofovir AF (BIKTARVY) 50-200-25 MG TABS tablet   Other Relevant Orders   COMPLETE METABOLIC PANEL WITH GFR (Completed)   HIV-1 RNA quant-no reflex-bld   T-helper cell (CD4)- ([facility] clinic only) (Completed)   CBC w/Diff (Completed)   Vision changes    Tyler Horn has visual changes of unclear origin with no trauma or head injury.  He has not been sexually active and unlikely syphilis which was previously treated.  May need further evaluation by ophthalmology.  Will work on referring to primary care.  Addendum: It was determined after his appointment that he drove himself in his personal vehicle despite saying he was unable to drive.      Weight loss    Tyler Horn has had 20 pounds of weight loss over the last 5 months of unclear origin.  After speaking to his case manager who has delivered him  ensure and food, it is unclear if he is eating on a regular basis.  There appears to be more to the story as he is provided different stories to different providers.  He is due for colonoscopy and recommend other cancer screenings. He has no symptoms of TB. May need further work up through internal medicine.       Relevant Orders   Ambulatory referral to Internal Medicine    Other Visit Diagnoses    Screening for STDs (sexually transmitted diseases)       Relevant Orders   RPR (Completed)   Routine medical exam       Relevant Orders   Ambulatory referral to Internal Medicine   Asymptomatic HIV infection (HCC)       Relevant Medications   bictegravir-emtricitabine-tenofovir AF (BIKTARVY) 50-200-25 MG TABS tablet       I have changed Tyler Horn. I am also having him maintain his prazosin, hydrOXYzine, escitalopram, clonazePAM, nevirapine, and QUEtiapine.   Meds ordered this encounter  Medications  . bictegravir-emtricitabine-tenofovir AF (BIKTARVY) 50-200-25 MG TABS tablet    Sig: Take 1 tablet by mouth daily.    Dispense:  30 tablet    Refill:  2    Order Specific Question:   Supervising Provider    Answer:   Judyann Munson [[id number]]     Follow-up: Return in about 1 month (around 08/07/2020), or if symptoms worsen or fail to improve.   Tyler Eke, MSN, FNP-C Nurse Practitioner [facility] for Infectious Disease [facility] Main number: [phone]

## 2020-07-09 DIAGNOSIS — R634 Abnormal weight loss: Secondary | ICD-10-CM | POA: Insufficient documentation

## 2020-07-09 DIAGNOSIS — H539 Unspecified visual disturbance: Secondary | ICD-10-CM | POA: Insufficient documentation

## 2020-07-09 LAB — T-HELPER CELL (CD4) - (RCID CLINIC ONLY)
CD4 % Helper T Cell: 34 % (ref 33–65)
CD4 T Cell Abs: 667 /uL (ref 400–1790)

## 2020-07-09 MED ORDER — BIKTARVY 50-200-25 MG PO TABS: 1.0000 | ORAL_TABLET | Freq: Every day | ORAL | 2 refills | Status: DC

## 2020-07-09 NOTE — Assessment & Plan Note (Signed)
Mr. Tallman continues to have well-controlled HIV disease with good adherence and tolerance to his ART regimen of Biktarvy.  No signs/symptoms of opportunistic infection or progressive HIV disease.  We reviewed previous lab work and discussed plan of care.  Check blood work today.  Continue current dose of Biktarvy.  Plan for follow-up in 2 months or sooner if needed.

## 2020-07-09 NOTE — Assessment & Plan Note (Signed)
After speaking with his case manager I am concerned he may have exacerbation of his depression leading to psychosis resulting in these describes symptoms and decreased oral intake and weight loss.  Case management is working on establishing an appointment to review his information and determine the best direction for his care.  Continues to need psychiatry for management of medication and mood.

## 2020-07-09 NOTE — Assessment & Plan Note (Addendum)
Mr. Dalgleish has visual changes of unclear origin with no trauma or head injury.  He has not been sexually active and unlikely syphilis which was previously treated.  May need further evaluation by ophthalmology.  Will work on referring to primary care.  Addendum: It was determined after his appointment that he drove himself in his personal vehicle despite saying he was unable to drive.

## 2020-07-09 NOTE — Assessment & Plan Note (Signed)
Tyler Horn has had 20 pounds of weight loss over the last 5 months of unclear origin.  After speaking to his case manager who has delivered him ensure and food, it is unclear if he is eating on a regular basis.  There appears to be more to the story as he is provided different stories to different providers.  He is due for colonoscopy and recommend other cancer screenings. He has no symptoms of TB. May need further work up through internal medicine.

## 2020-07-14 LAB — RPR TITER: RPR Titer: 1:2 {titer} — ABNORMAL HIGH

## 2020-07-14 LAB — CBC WITH DIFFERENTIAL/PLATELET
Absolute Monocytes: 499 cells/uL (ref 200–950)
Basophils Absolute: 58 cells/uL (ref 0–200)
Basophils Relative: 0.9 %
Eosinophils Absolute: 173 cells/uL (ref 15–500)
Eosinophils Relative: 2.7 %
HCT: 47 % (ref 38.5–50.0)
Hemoglobin: 15.9 g/dL (ref 13.2–17.1)
Lymphs Abs: 2106 cells/uL (ref 850–3900)
MCH: 30.3 pg (ref 27.0–33.0)
MCHC: 33.8 g/dL (ref 32.0–36.0)
MCV: 89.5 fL (ref 80.0–100.0)
MPV: 9.6 fL (ref 7.5–12.5)
Monocytes Relative: 7.8 %
Neutro Abs: 3565 cells/uL (ref 1500–7800)
Neutrophils Relative %: 55.7 %
Platelets: 327 10*3/uL (ref 140–400)
RBC: 5.25 10*6/uL (ref 4.20–5.80)
RDW: 13.6 % (ref 11.0–15.0)
Total Lymphocyte: 32.9 %
WBC: 6.4 10*3/uL (ref 3.8–10.8)

## 2020-07-14 LAB — COMPLETE METABOLIC PANEL WITH GFR
AG Ratio: 1.6 (calc) (ref 1.0–2.5)
ALT: 15 U/L (ref 9–46)
AST: 18 U/L (ref 10–35)
Albumin: 4.3 g/dL (ref 3.6–5.1)
Alkaline phosphatase (APISO): 84 U/L (ref 35–144)
BUN: 16 mg/dL (ref 7–25)
CO2: 28 mmol/L (ref 20–32)
Calcium: 9.7 mg/dL (ref 8.6–10.3)
Chloride: 107 mmol/L (ref 98–110)
Creat: 0.91 mg/dL (ref 0.70–1.33)
GFR, Est African American: 107 mL/min/{1.73_m2} (ref >=60)
GFR, Est Non African American: 92 mL/min/{1.73_m2} (ref >=60)
Globulin: 2.7 g/dL (calc) (ref 1.9–3.7)
Glucose, Bld: 91 mg/dL (ref 65–99)
Potassium: 3.6 mmol/L (ref 3.5–5.3)
Sodium: 142 mmol/L (ref 135–146)
Total Bilirubin: 0.6 mg/dL (ref 0.2–1.2)
Total Protein: 7 g/dL (ref 6.1–8.1)

## 2020-07-14 LAB — HIV-1 RNA QUANT-NO REFLEX-BLD
HIV 1 RNA Quant: <20 Copies/mL
HIV-1 RNA Quant, Log: <1.3 Log cps/mL

## 2020-07-14 LAB — FLUORESCENT TREPONEMAL AB(FTA)-IGG-BLD: Fluorescent Treponemal ABS: REACTIVE — AB

## 2020-07-14 LAB — RPR: RPR Ser Ql: REACTIVE — AB

## 2020-08-07 ENCOUNTER — Encounter: Payer: Self-pay | Admitting: Family

## 2020-08-07 ENCOUNTER — Ambulatory Visit: Payer: Self-pay | Admitting: Family

## 2020-08-07 ENCOUNTER — Other Ambulatory Visit: Payer: Self-pay

## 2020-08-07 VITALS — BP 138/79 | HR 80 | Temp 97.6°F | Wt 192.0 lb

## 2020-08-07 DIAGNOSIS — R634 Abnormal weight loss: Secondary | ICD-10-CM

## 2020-08-07 DIAGNOSIS — Z21 Asymptomatic human immunodeficiency virus [HIV] infection status: Secondary | ICD-10-CM

## 2020-08-07 DIAGNOSIS — F411 Generalized anxiety disorder: Secondary | ICD-10-CM

## 2020-08-07 DIAGNOSIS — H539 Unspecified visual disturbance: Secondary | ICD-10-CM

## 2020-08-07 MED ORDER — HYDROXYZINE HCL 25 MG PO TABS: 25.0000 mg | ORAL_TABLET | Freq: Three times a day (TID) | ORAL | 1 refills | Status: DC | PRN

## 2020-08-07 NOTE — Assessment & Plan Note (Signed)
Mr. Bloxham continues to have generalized anxiety and remains on Lexapro and Seroquel.  Restart hydroxyzine as needed for panic attacks.  Would avoid controlled substances as there has been concern in the past about his use of medications.  Consider counseling if needed.  Recommend follow-up with psychiatrist.

## 2020-08-07 NOTE — Assessment & Plan Note (Signed)
BMI places him at normal weight.  He is requesting Ensure to help with his nutrition intake.  We will coordinate with his case manager following his meeting next week.

## 2020-08-07 NOTE — Patient Instructions (Signed)
Nice to see you.  Continue to take your Maud daily as prescribed.  We will work on getting you Ensure  Follow up with your Case Manager.   Recommend following up with Primary Care and being seen by opthalmology  Plan for follow up 2 months or sooner if needed.   Have a great day and stay safe!  Happy Holidays!

## 2020-08-07 NOTE — Assessment & Plan Note (Signed)
Vision changes previously described remain unchanged since last office visit.  Unable to see ophthalmologist in the interim secondary to lack of insurance.  Encouraged to follow-up with primary care for further evaluation.

## 2020-08-07 NOTE — Progress Notes (Signed)
Subjective:    Patient ID: Tyler Horn, male    DOB: [birth year], 59 y.o.   MRN: [id number]  Chief Complaint  Patient presents with  . Follow-up    Requesting anxiety meds      HPI:  Tyler Horn is a 59 y.o. male with HIV disease who was last seen on 07/08/20 with good adherence and tolerance to his ART regimen of Biktarvy. Viral load was undetectable and CD4 count of 667. Here today for routine follow up. Tyler Horn is hearing impaired and a sign language interpreter is present to aid in communication.  Tyler Horn has been taking his Biktarvy daily as prescribed with no adverse side effects or missed doses. He has continued concerns about his eyesight and his levels of anxiety, both of which are unchanged since previous visit. Continues to drive limited and as needed. No fevers or chills. Requesting non-controlled medication to help with his anxiety for which he is taking Lexapro and Seroquel. Denies fevers, chills, night sweats, headaches, changes in vision, neck pain/stiffness, nausea, diarrhea, vomiting, lesions or rashes.  Tyler Horn has no problems obtaining medication from the pharmacy. Currently with [facility] for HIV care. Continues to work with [facility]/Case management and attempting to get Social Security Disability. Denies any recreational or illicit drug use, tobacco use or alcohol consumption. Declines condoms. Has a meeting scheduled with his case manager for next Thursday. Would like Ensure as he continues to lose weight.      No Known Allergies    Outpatient Medications Prior to Visit  Medication Sig Dispense Refill  . bictegravir-emtricitabine-tenofovir AF (BIKTARVY) 50-200-25 MG TABS tablet Take 1 tablet by mouth daily. 30 tablet 2  . escitalopram (LEXAPRO) 20 MG tablet Take by mouth.    . QUEtiapine (SEROQUEL) 200 MG tablet Take 1 tablet (200 mg total) by mouth at bedtime. 30 tablet 1  . clonazePAM (KLONOPIN) 0.5 MG tablet Take by mouth. (Patient not taking: Reported  on 08/07/2020)    . nevirapine (VIRAMUNE) 200 MG tablet Take by mouth. (Patient not taking: No sig reported)    . prazosin (MINIPRESS) 1 MG capsule Take 1 capsule (1 mg total) by mouth at bedtime. (Patient not taking: No sig reported) 30 capsule 1  . hydrOXYzine (ATARAX/VISTARIL) 25 MG tablet Take 1 tablet (25 mg total) by mouth 3 (three) times daily as needed for anxiety. (Patient not taking: No sig reported) 30 tablet 1   No facility-administered medications prior to visit.     Past Medical History:  Diagnosis Date  . Anxiety   . Depression   . HIV (human immunodeficiency virus infection) (HCC)   . HIV (human immunodeficiency virus infection) (HCC)      Past Surgical History:  Procedure Laterality Date  . CATARACT EXTRACTION Left        Review of Systems  Constitutional: Negative for appetite change, chills, fatigue, fever and unexpected weight change.  Eyes: Negative for visual disturbance.  Respiratory: Negative for cough, chest tightness, shortness of breath and wheezing.   Cardiovascular: Negative for chest pain and leg swelling.  Gastrointestinal: Negative for abdominal pain, constipation, diarrhea, nausea and vomiting.  Genitourinary: Negative for dysuria, flank pain, frequency, genital sores, hematuria and urgency.  Skin: Negative for rash.  Allergic/Immunologic: Negative for immunocompromised state.  Neurological: Negative for dizziness and headaches.      Objective:    BP 138/79   Pulse 80   Temp 97.6 F (36.4 C) (Oral)   Wt 192 lb (87.1 kg)  BMI 23.37 kg/m  Nursing note and vital signs reviewed.  Physical Exam Constitutional:      General: He is not in acute distress.    Appearance: He is well-developed.  HENT:     Mouth/Throat:     Mouth: Oropharynx is clear and moist.  Eyes:     Conjunctiva/sclera: Conjunctivae normal.  Cardiovascular:     Rate and Rhythm: Normal rate and regular rhythm.     Pulses: Intact distal pulses.     Heart sounds:  Normal heart sounds. No murmur heard. No friction rub. No gallop.   Pulmonary:     Effort: Pulmonary effort is normal. No respiratory distress.     Breath sounds: Normal breath sounds. No wheezing or rales.  Chest:     Chest wall: No tenderness.  Abdominal:     General: Bowel sounds are normal.     Palpations: Abdomen is soft.     Tenderness: There is no abdominal tenderness.  Musculoskeletal:     Cervical back: Neck supple.  Lymphadenopathy:     Cervical: No cervical adenopathy.  Skin:    General: Skin is warm and dry.     Findings: No rash.  Neurological:     Mental Status: He is alert and oriented to person, place, and time.  Psychiatric:        Attention and Perception: Attention normal.        Mood and Affect: Mood normal.        Behavior: Behavior normal.        Thought Content: Thought content normal.        Judgment: Judgment normal.      No flowsheet data found.     Assessment & Plan:    Patient Active Problem List   Diagnosis Date Noted  . Vision changes 07/09/2020  . Weight loss 07/09/2020  . Chronic hepatitis C (HCC) 04/27/2020  . Idiopathic peripheral neuropathy 04/27/2020  . Herpes simplex 04/27/2020  . Herpes zoster 04/27/2020  . Healthcare maintenance 12/19/2019  . 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 Post-traumatic stress disorder, unspecified 11/28/2019  . 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 Diarrhea   . Anxiety state   . Severe recurrent major depression without psychotic features (HCC) 07/13/2017  . Major depressive disorder, recurrent, severe without psychotic behavior (HCC) 07/13/2017  . MDD (major depressive disorder), recurrent, severe, with psychosis (HCC) 07/31/2014  . Benzodiazepine misuse 07/31/2014  . Human immunodeficiency virus infection (HCC)   . Severe major depression (HCC) 07/30/2014  . Delusional disorder (HCC) 07/30/2014  . Suicidal thoughts 07/30/2014   . Hallucinations   . Major depressive disorder, recurrent episode (HCC) 10/03/2013  . Anxiety, generalized 03/26/2013  . Generalized anxiety disorder 03/26/2013     Problem List Items Addressed This Visit      Other   Anxiety, generalized    Tyler Horn continues to have generalized anxiety and remains on Lexapro and Seroquel.  Restart hydroxyzine as needed for panic attacks.  Would avoid controlled substances as there has been concern in the past about his use of medications.  Consider counseling if needed.  Recommend follow-up with psychiatrist.      Relevant Medications   hydrOXYzine (ATARAX/VISTARIL) 25 MG tablet   Human immunodeficiency virus infection [facility])    Tyler Horn continues to have well-controlled HIV disease with good adherence and tolerance to his ART regimen of Biktarvy.  No signs/symptoms of opportunistic infection or progressive HIV disease.  Changes  in vision likely unrelated to HIV.  Reviewed previous blood work and discussed plan of care.  Continue current dose of Biktarvy.  Plan for follow-up in 2 months or sooner if needed.      Vision changes    Vision changes previously described remain unchanged since last office visit.  Unable to see ophthalmologist in the interim secondary to lack of insurance.  Encouraged to follow-up with primary care for further evaluation.      Weight loss    BMI places him at normal weight.  He is requesting Ensure to help with his nutrition intake.  We will coordinate with his case manager following his meeting next week.          I am having Tyler Horn maintain his prazosin, escitalopram, clonazePAM, nevirapine, QUEtiapine, Biktarvy, and hydrOXYzine.   Meds ordered this encounter  Medications  . hydrOXYzine (ATARAX/VISTARIL) 25 MG tablet    Sig: Take 1 tablet (25 mg total) by mouth 3 (three) times daily as needed for anxiety.    Dispense:  30 tablet    Refill:  1    Order Specific Question:   Supervising Provider     Answer:   Judyann Munson [[id number]]     Follow-up: Return in about 2 months (around 10/08/2020), or if symptoms worsen or fail to improve.   Marcos Eke, MSN, FNP-C Nurse Practitioner [facility] for Infectious Disease [facility] Main number: [phone]

## 2020-08-07 NOTE — Assessment & Plan Note (Signed)
Mr. Gavin continues to have well-controlled HIV disease with good adherence and tolerance to his ART regimen of Biktarvy.  No signs/symptoms of opportunistic infection or progressive HIV disease.  Changes in vision likely unrelated to HIV.  Reviewed previous blood work and discussed plan of care.  Continue current dose of Biktarvy.  Plan for follow-up in 2 months or sooner if needed.

## 2020-08-21 ENCOUNTER — Ambulatory Visit: Payer: Self-pay

## 2020-08-23 ENCOUNTER — Other Ambulatory Visit: Payer: Self-pay | Admitting: Family

## 2020-09-16 ENCOUNTER — Other Ambulatory Visit: Payer: Self-pay

## 2020-09-16 ENCOUNTER — Ambulatory Visit: Payer: Self-pay

## 2020-09-16 ENCOUNTER — Other Ambulatory Visit: Payer: Self-pay | Admitting: Family

## 2020-09-30 ENCOUNTER — Ambulatory Visit: Payer: Self-pay

## 2020-10-06 MED ORDER — HYDROXYZINE HCL 25 MG PO TABS: 25.0000 mg | ORAL_TABLET | Freq: Three times a day (TID) | ORAL | 1 refills | Status: DC | PRN

## 2020-10-13 ENCOUNTER — Other Ambulatory Visit: Payer: Self-pay

## 2020-10-13 ENCOUNTER — Emergency Department (HOSPITAL_COMMUNITY)
Admission: EM | Admit: 2020-10-13 | Discharge: 2020-10-13 | Disposition: A | Discharge status: Home / Self Care | Payer: Self-pay | Attending: Emergency Medicine | Admitting: Emergency Medicine

## 2020-10-13 ENCOUNTER — Emergency Department (HOSPITAL_COMMUNITY): Payer: Self-pay

## 2020-10-13 ENCOUNTER — Encounter (HOSPITAL_COMMUNITY): Payer: Self-pay | Admitting: Emergency Medicine

## 2020-10-13 DIAGNOSIS — R399 Unspecified symptoms and signs involving the genitourinary system: Secondary | ICD-10-CM

## 2020-10-13 DIAGNOSIS — N200 Calculus of kidney: Secondary | ICD-10-CM

## 2020-10-13 DIAGNOSIS — R339 Retention of urine, unspecified: Secondary | ICD-10-CM | POA: Insufficient documentation

## 2020-10-13 DIAGNOSIS — Z21 Asymptomatic human immunodeficiency virus [HIV] infection status: Secondary | ICD-10-CM | POA: Insufficient documentation

## 2020-10-13 LAB — BASIC METABOLIC PANEL
Anion gap: 14 (ref 5–15)
BUN: 20 mg/dL (ref 6–20)
CO2: 23 mmol/L (ref 22–32)
Calcium: 10.1 mg/dL (ref 8.9–10.3)
Chloride: 103 mmol/L (ref 98–111)
Creatinine, Ser: 1.09 mg/dL (ref 0.61–1.24)
GFR, Estimated: >60 mL/min (ref >60)
Glucose, Bld: 98 mg/dL (ref 70–99)
Potassium: 4.2 mmol/L (ref 3.5–5.1)
Sodium: 140 mmol/L (ref 135–145)

## 2020-10-13 LAB — CBC WITH DIFFERENTIAL/PLATELET
Abs Immature Granulocytes: 0.04 10*3/uL (ref 0.00–0.07)
Basophils Absolute: 0.1 10*3/uL (ref 0.0–0.1)
Basophils Relative: 1 %
Eosinophils Absolute: 0.1 10*3/uL (ref 0.0–0.5)
Eosinophils Relative: 1 %
HCT: 52.3 % — ABNORMAL HIGH (ref 39.0–52.0)
Hemoglobin: 18.1 g/dL — ABNORMAL HIGH (ref 13.0–17.0)
Immature Granulocytes: 0 %
Lymphocytes Relative: 35 %
Lymphs Abs: 3.3 10*3/uL (ref 0.7–4.0)
MCH: 31.2 pg (ref 26.0–34.0)
MCHC: 34.6 g/dL (ref 30.0–36.0)
MCV: 90 fL (ref 80.0–100.0)
Monocytes Absolute: 0.9 10*3/uL (ref 0.1–1.0)
Monocytes Relative: 9 %
Neutro Abs: 5.3 10*3/uL (ref 1.7–7.7)
Neutrophils Relative %: 54 %
Platelets: 384 10*3/uL (ref 150–400)
RBC: 5.81 MIL/uL (ref 4.22–5.81)
RDW: 13.4 % (ref 11.5–15.5)
WBC: 9.7 10*3/uL (ref 4.0–10.5)
nRBC: 0 % (ref 0.0–0.2)

## 2020-10-13 LAB — URINALYSIS, ROUTINE W REFLEX MICROSCOPIC
Bacteria, UA: NONE SEEN
Bilirubin Urine: NEGATIVE
Glucose, UA: NEGATIVE mg/dL
Ketones, ur: NEGATIVE mg/dL
Leukocytes,Ua: NEGATIVE
Nitrite: NEGATIVE
Protein, ur: NEGATIVE mg/dL
RBC / HPF: >50 RBC/hpf — ABNORMAL HIGH (ref 0–5)
Specific Gravity, Urine: 1.023 (ref 1.005–1.030)
pH: 5 (ref 5.0–8.0)

## 2020-10-13 MED ORDER — KETOROLAC TROMETHAMINE 30 MG/ML IJ SOLN
30.0000 mg | Freq: Once | INTRAMUSCULAR | Status: AC
  Administered 2020-10-13: 30 mg via INTRAVENOUS
  Filled 2020-10-13: qty 1

## 2020-10-13 MED ORDER — LORAZEPAM 2 MG/ML IJ SOLN
0.5000 mg | Freq: Once | INTRAMUSCULAR | Status: AC
  Administered 2020-10-13: 0.5 mg via INTRAVENOUS
  Filled 2020-10-13: qty 1

## 2020-10-13 NOTE — ED Notes (Signed)
Bladder scan showed 19, 20, 18 mL. Bladder tender to palpation.

## 2020-10-13 NOTE — ED Provider Notes (Signed)
[facility]-EMERGENCY DEPT Provider Note   CSN: [id number] Arrival date & time: 10/13/20  2952     History Chief Complaint  Patient presents with  . Urinary Retention    Tyler Horn is a 60 y.o. male.  HPI   60 year old male presents to the emergency department for evaluation of reported urinary retention.  Patient is deaf, interpreter/ASL used for evaluation.  Patient states the last 3 days he has had urinary frequency and the urge to go however when he tries to go he is only able to relieve himself a couple drops.  Denies any hematuria or foul odor to the urine.  He states he now has a fullness sensation in his lower abdomen that is very uncomfortable.  Denies any urinary problems in the past.  No history of kidney stones or BPH.  He has never required a Foley before.  Denies any fever or other systemic illness symptoms.  No diarrhea or other abdominal/back pain.  Past Medical History:  Diagnosis Date  . Anxiety   . Depression   . HIV (human immunodeficiency virus infection) (HCC)   . HIV (human immunodeficiency virus infection) [facility])     Patient Active Problem List   Diagnosis Date Noted  . Vision changes 07/09/2020  . Weight loss 07/09/2020  . Chronic hepatitis C (HCC) 04/27/2020  . Idiopathic peripheral neuropathy 04/27/2020  . Herpes simplex 04/27/2020  . Herpes zoster 04/27/2020  . Healthcare maintenance 12/19/2019  . 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 Post-traumatic stress disorder, unspecified 11/28/2019  . 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 Diarrhea   . Anxiety state   . Severe recurrent major depression without psychotic features (HCC) 07/13/2017  . Major depressive disorder, recurrent, severe without psychotic behavior (HCC) 07/13/2017  . MDD (major depressive disorder), recurrent, severe, with psychosis (HCC) 07/31/2014  . Benzodiazepine misuse 07/31/2014  .  Human immunodeficiency virus infection (HCC)   . Severe major depression (HCC) 07/30/2014  . Delusional disorder (HCC) 07/30/2014  . Suicidal thoughts 07/30/2014  . Hallucinations   . Major depressive disorder, recurrent episode (HCC) 10/03/2013  . Anxiety, generalized 03/26/2013  . Generalized anxiety disorder 03/26/2013    Past Surgical History:  Procedure Laterality Date  . CATARACT EXTRACTION Left        Family History  Problem Relation Age of Onset  . Mental illness Mother   . Bipolar disorder Brother     Social History   Tobacco Use  . Smoking status: Never Smoker  . Smokeless tobacco: Never Used  Substance Use Topics  . Alcohol use: No  . Drug use: No    Home Medications Prior to Admission medications   Medication Sig Start Date End Date Taking? Authorizing Provider  bictegravir-emtricitabine-tenofovir AF (BIKTARVY) 50-200-25 MG TABS tablet Take 1 tablet by mouth daily. 07/09/20   Veryl Speak, FNP  clonazePAM (KLONOPIN) 0.5 MG tablet Take by mouth. Patient not taking: Reported on 08/07/2020 12/26/19   [provider]  escitalopram (LEXAPRO) 20 MG tablet Take by mouth. 06/09/20   [provider]  hydrOXYzine (ATARAX/VISTARIL) 25 MG tablet Take 1 tablet (25 mg total) by mouth 3 (three) times daily as needed for anxiety. 10/06/20   Veryl Speak, FNP  nevirapine (VIRAMUNE) 200 MG tablet Take by mouth. Patient not taking: No sig reported 12/26/19   [provider]  prazosin (MINIPRESS) 1 MG capsule Take 1 capsule (1 mg total) by mouth at bedtime. Patient not  taking: No sig reported 12/03/19   Money, Gerlene Burdock, FNP  QUEtiapine (SEROQUEL) 200 MG tablet TAKE 1 TABLET BY MOUTH AT BEDTIME 08/25/20   Veryl Speak, FNP    Allergies    Patient has no known allergies.  Review of Systems   Review of Systems  Constitutional: Negative for chills and fever.  Respiratory: Negative for shortness of breath.   Cardiovascular: Negative for  chest pain.  Gastrointestinal: Positive for abdominal pain. Negative for diarrhea and vomiting.  Endocrine: Negative for polydipsia.  Genitourinary: Positive for decreased urine volume, difficulty urinating, dysuria, frequency and urgency. Negative for flank pain, hematuria, penile discharge, penile pain, scrotal swelling and testicular pain.  Musculoskeletal: Negative for back pain.  Skin: Negative for rash.  Neurological: Negative for headaches.  Psychiatric/Behavioral: Negative for confusion.    Physical Exam Updated Vital Signs BP 113/74 (BP Location: Right Arm)   Pulse 87   Temp 97.6 F (36.4 C) (Oral)   Resp 20   SpO2 100%   Physical Exam Vitals and nursing note reviewed.  Constitutional:      Appearance: Normal appearance.     Comments: Constantly shifting in bed, appears uncomfortable  HENT:     Head: Normocephalic.     Mouth/Throat:     Mouth: Mucous membranes are moist.  Cardiovascular:     Rate and Rhythm: Normal rate.  Pulmonary:     Effort: Pulmonary effort is normal. No respiratory distress.  Abdominal:     Comments: Tenderness and fullness palpated in the suprapubic area, abdomen is otherwise soft and benign, no flank pain  Musculoskeletal:     Comments: Equal pulses in 4/4 extremities, no discoloration  Skin:    General: Skin is warm.  Neurological:     Mental Status: He is alert and oriented to person, place, and time. Mental status is at baseline.  Psychiatric:     Comments: Tearful, anxious and frustrated     ED Results / Procedures / Treatments   Labs (all labs ordered are listed, but only abnormal results are displayed) Labs Reviewed  URINALYSIS, ROUTINE W REFLEX MICROSCOPIC  BASIC METABOLIC PANEL  CBC WITH DIFFERENTIAL/PLATELET    EKG None  Radiology No results found.  Procedures Procedures   Medications Ordered in ED Medications - No data to display  ED Course  I have reviewed the triage vital signs and the nursing  notes.  Pertinent labs & imaging results that were available during my care of the patient were reviewed by me and considered in my medical decision making (see chart for details).    MDM Rules/Calculators/A&P                          60 year old deaf male presents the emergency department with reported urinary retention.  He appears uncomfortable on exam, easily gets frustrated with multiple questions, has tenderness and fullness palpated to the suprapubic area, he is unable to give a urine sample.  Bedside bladder scan is only reading 20-30 mL however patient is very tender during reading.  The rest of his abdominal exam is benign, he denies any vomiting or diarrhea.  He is afebrile.  Given his physical exam plan is for Foley placement to see if this relieves him of a large amount of urine and symptoms, will evaluate with labs.  Difficult exam/history given interpreter and cooperation.  Foley was placed, immediately drained 30 cc of urine, patient had improvement of symptoms.  Blood work is  reassuring, kidney function is normal, no signs of infection, urinalysis shows large amount of blood.  Due to patient's pain level and not being convinced this was of urinary retention a CAT scan was done.  This shows bilateral nephrolithiasis with some mild inflammation of the bladder and the Foley in place.  I suspect the patient could have passed a stone which was causing his symptoms.  Rest of the CAT scan is normal, no AAA, no other acute finding.  On my reevaluation patient is completely symptom-free, again increasing my suspicion for a past stone.  We were able to remove the Foley, he was able to pass urine.  He has follow-up with his primary doctor tomorrow and will ask for urology referral, I have given him urology follow-up just in case.  I have gone over the results and instructions extensively with ASL interpreter.  Patient will be discharged and treated as an outpatient.  Discharge plan and strict return  to ED precautions discussed, patient verbalizes understanding and agreement.  Final Clinical Impression(s) / ED Diagnoses Final diagnoses:  None    Rx / DC Orders ED Discharge Orders    None       Rozelle Logan, DO 10/13/20 1337

## 2020-10-13 NOTE — Discharge Instructions (Addendum)
You have been seen and discharged from the emergency department.  You had a Foley placed and removed at this visit.  Your CAT scan shows kidney stones up in the kidney, it is possible that you have passed a kidney stone which caused your symptoms earlier.  We are not going to give you antibiotics at this visit since there are no findings of acute infection and will send the urine for culture.  If this comes back abnormal you will be contacted. Your primary doctor and urologist can repeat a urine test as well.  Follow-up with your primary provider and establish care with a urologist for reevaluation. Take home medications as prescribed. If you have any worsening symptoms or further concerns for health please return to an emergency department for further evaluation.

## 2020-10-13 NOTE — ED Triage Notes (Signed)
Pt deaf, using ASL interpretor via [place]. Reports not urinated in 3 days. Only having a few drops when comes out. Pt very restless.

## 2020-10-14 ENCOUNTER — Emergency Department (HOSPITAL_COMMUNITY)
Admission: EM | Admit: 2020-10-14 | Discharge: 2020-10-14 | Disposition: A | Discharge status: Home / Self Care | Payer: Self-pay | Attending: Emergency Medicine | Admitting: Emergency Medicine

## 2020-10-14 ENCOUNTER — Encounter (HOSPITAL_COMMUNITY): Payer: Self-pay

## 2020-10-14 ENCOUNTER — Ambulatory Visit: Payer: Self-pay

## 2020-10-14 DIAGNOSIS — F419 Anxiety disorder, unspecified: Secondary | ICD-10-CM | POA: Insufficient documentation

## 2020-10-14 DIAGNOSIS — Z79899 Other long term (current) drug therapy: Secondary | ICD-10-CM | POA: Insufficient documentation

## 2020-10-14 DIAGNOSIS — R339 Retention of urine, unspecified: Secondary | ICD-10-CM | POA: Insufficient documentation

## 2020-10-14 DIAGNOSIS — F32A Depression, unspecified: Secondary | ICD-10-CM | POA: Insufficient documentation

## 2020-10-14 DIAGNOSIS — Z21 Asymptomatic human immunodeficiency virus [HIV] infection status: Secondary | ICD-10-CM | POA: Insufficient documentation

## 2020-10-14 LAB — URINE CULTURE: Culture: NO GROWTH

## 2020-10-14 MED ORDER — LORAZEPAM 0.5 MG PO TABS: 0.5000 mg | ORAL_TABLET | Freq: Three times a day (TID) | ORAL | 0 refills | Status: DC | PRN

## 2020-10-14 NOTE — Progress Notes (Unsigned)
Mental Health Therapist Progress Note   Name: Avantae Bither  Total time: 30  Type of Service: Individual Outpatient Mental Health Therapy  OBJECTIVE:  Mood: Euthymic and Affect: Appropriate Risk of harm to self or others: No plan to harm self or others  DIAGNOSIS:   GOALS ADDRESSED:  Patient will: 1.  Reduce symptoms of: stress  2.  Increase knowledge and/or ability of: coping skills  3.  Demonstrate ability to: Increase healthy adjustment to current life circumstances  INTERVENTIONS: Interventions utilized:  Supportive Counseling Therapist met with patient for outpatient mental health individual therapy to include ongoing assessment, support, and reinforcement.  Therapist allowed patient to "check in" since previous session; asking patient to share any positive coping skills they may have used over the previous week, along with any challenges faced.  Therapist provided supportive listening as patient processed their thoughts, emotional responses, and behaviors surrounding several stressors.  EFFECTIVENESS/PLAN: Client was alert, oriented x3, with no SI, HI, or symptoms of psychosis (risk low).  Client was pleasant and friendly, engaging openly and appropriately with therapist, benefiting from supportive listening and exploration of feelings.  Next session recommended in one to two weeks.   Hughes Better, LCSW

## 2020-10-14 NOTE — ED Notes (Signed)
Bladder scan volume: 57mL.

## 2020-10-14 NOTE — ED Provider Notes (Signed)
[facility]-EMERGENC[facility] DEPT Provider Note   CSN: [id number] Arrival date & time: 10/14/20  7858     History Chief Complaint  Patient presents with  . Urinary Retention    Shadrack Brummitt is a 60 y.o. male.  60 year old male presents to the ER with initial complaint of urinary retention, seen in the ER yesterday for same and found to have small volume of urine in the bladder.  Patient had a Foley catheter placed which drained small volume of urine consistent with bladder scanner findings.  Found to have blood in his urine and was sent for CT scan, and the and thought to have maybe passed a stone, Foley was removed and patient was able to void independently, referred to urology.  Patient returns emergency room today, states that he was unable to void until he got here and then had an accident and urinated on the floor.  Patient states that this time he feels like he is having a panic attack, feels like his panic attacks are keeping him from being able to empty his bladder and is requesting medication for his anxiety.  Patient states that he has tried taking Vistaril as prescribed by his provider without relief of his panic attacks.  Patient is scheduled to see a therapist today and plans to asked to see the psychiatrist.  Patient states that his psychiatrist will no longer prescribe him his medications.  Patient denies suicidal homicidal ideation.  No other complaints or concerns today. Medical history of HIV, compliant with medications, anxiety, depression.  A language interpreter was used (ASL).       Past Medical History:  Diagnosis Date  . Anxiety   . Depression   . HIV (human immunodeficiency virus infection) (HCC)   . HIV (human immunodeficiency virus infection) [facility])     Patient Active Problem List   Diagnosis Date Noted  . Vision changes 07/09/2020  . Weight loss 07/09/2020  . Chronic hepatitis C (HCC) 04/27/2020  . Idiopathic peripheral neuropathy  04/27/2020  . Herpes simplex 04/27/2020  . Herpes zoster 04/27/2020  . Healthcare maintenance 12/19/2019  . 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 Post-traumatic stress disorder, unspecified 11/28/2019  . 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 Diarrhea   . Anxiety state   . Severe recurrent major depression without psychotic features (HCC) 07/13/2017  . Major depressive disorder, recurrent, severe without psychotic behavior (HCC) 07/13/2017  . MDD (major depressive disorder), recurrent, severe, with psychosis (HCC) 07/31/2014  . Benzodiazepine misuse 07/31/2014  . Human immunodeficiency virus infection (HCC)   . Severe major depression (HCC) 07/30/2014  . Delusional disorder (HCC) 07/30/2014  . Suicidal thoughts 07/30/2014  . Hallucinations   . Major depressive disorder, recurrent episode (HCC) 10/03/2013  . Anxiety, generalized 03/26/2013  . Generalized anxiety disorder 03/26/2013    Past Surgical History:  Procedure Laterality Date  . CATARACT EXTRACTION Left        Family History  Problem Relation Age of Onset  . Mental illness Mother   . Bipolar disorder Brother     Social History   Tobacco Use  . Smoking status: Never Smoker  . Smokeless tobacco: Never Used  Substance Use Topics  . Alcohol use: No  . Drug use: No    Home Medications Prior to Admission medications   Medication Sig Start Date End Date Taking? Authorizing Provider  LORazepam (ATIVAN) 0.5 MG tablet Take 1 tablet (0.5 mg total) by mouth  3 (three) times daily as needed for up to 3 doses for anxiety. 10/14/20  Yes Jeannie Fend, PA-C  bictegravir-emtricitabine-tenofovir AF (BIKTARVY) 50-200-25 MG TABS tablet Take 1 tablet by mouth daily. 07/09/20   Veryl Speak, FNP  cholecalciferol (VITAMIN D3) 25 MCG (1000 UNIT) tablet Take 1,000 Units by mouth daily.    [provider]  clonazePAM (KLONOPIN) 0.5 MG tablet Take by  mouth. Patient not taking: No sig reported 12/26/19   [provider]  escitalopram (LEXAPRO) 20 MG tablet Take 20 mg by mouth daily. 06/09/20   [provider]  hydrOXYzine (ATARAX/VISTARIL) 25 MG tablet Take 1 tablet (25 mg total) by mouth 3 (three) times daily as needed for anxiety. 10/06/20   Veryl Speak, FNP  ibuprofen (ADVIL) 100 MG tablet Take 100 mg by mouth every 6 (six) hours as needed for fever.    [provider]  Lactobacillus (PROBIOTIC ACIDOPHILUS PO) Take 1 tablet by mouth daily.    [provider]  Omega-3 Fatty Acids (FISH OIL) 1000 MG CAPS Take 1,000 mg by mouth daily.    [provider]  prazosin (MINIPRESS) 1 MG capsule Take 1 capsule (1 mg total) by mouth at bedtime. Patient not taking: No sig reported 12/03/19   Money, Gerlene Burdock, FNP  psyllium (METAMUCIL) 58.6 % packet Take 1 packet by mouth daily.    [provider]  QUEtiapine (SEROQUEL) 200 MG tablet TAKE 1 TABLET BY MOUTH AT BEDTIME Patient taking differently: Take 200 mg by mouth at bedtime. 08/25/20   Veryl Speak, FNP    Allergies    Patient has no known allergies.  Review of Systems   Review of Systems  Respiratory: Negative for shortness of breath.   Cardiovascular: Negative for chest pain.  Gastrointestinal: Negative for abdominal pain and vomiting.  Genitourinary: Positive for difficulty urinating. Negative for dysuria.  Skin: Negative for wound.  Allergic/Immunologic: Positive for immunocompromised state.  Psychiatric/Behavioral: Negative for suicidal ideas. The patient is nervous/anxious.   All other systems reviewed and are negative.   Physical Exam Updated Vital Signs BP 136/88 (BP Location: Left Arm)   Pulse 90   Temp 97.6 F (36.4 C) (Oral)   Resp 18   SpO2 96%   Physical Exam Vitals and nursing note reviewed.  Constitutional:      General: He is not in acute distress.    Appearance: He is well-developed and well-nourished. He  is not diaphoretic.  HENT:     Head: Normocephalic and atraumatic.  Eyes:     Pupils: Pupils are equal, round, and reactive to light.  Cardiovascular:     Rate and Rhythm: Normal rate and regular rhythm.     Pulses: Normal pulses.     Heart sounds: Normal heart sounds.  Pulmonary:     Effort: Pulmonary effort is normal.     Breath sounds: Normal breath sounds.  Skin:    General: Skin is warm and dry.     Findings: No erythema or rash.  Neurological:     Mental Status: He is alert and oriented to person, place, and time.  Psychiatric:        Attention and Perception: Attention normal.        Mood and Affect: Mood is anxious. Affect is not angry or inappropriate.        Behavior: Behavior is hyperactive. Behavior is not aggressive or withdrawn.        Thought Content: Thought content is not paranoid.  Thought content does not include homicidal or suicidal ideation.        Cognition and Memory: Memory normal.     ED Results / Procedures / Treatments   Labs (all labs ordered are listed, but only abnormal results are displayed) Labs Reviewed  URINALYSIS, ROUTINE W REFLEX MICROSCOPIC    EKG None  Radiology CT Abdomen Pelvis Wo Contrast  Result Date: 10/13/2020 CLINICAL DATA:  Abdominal pain and dysuria EXAM: CT ABDOMEN AND PELVIS WITHOUT CONTRAST TECHNIQUE: Multidetector CT imaging of the abdomen and pelvis was performed following the standard protocol without oral or IV contrast. COMPARISON:  None. FINDINGS: Lower chest: There is bibasilar lung atelectatic change. There is no lung base edema or consolidation. Hepatobiliary: No focal liver lesions are appreciable on this noncontrast enhanced study. Gallbladder wall is not appreciably thickened. There is no evident biliary duct dilatation. 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 Pancreas: No pancreatic mass or inflammatory focus evident. Spleen: No splenic lesions evident.  Adrenals/Urinary Tract: Adrenals bilaterally appear normal. There is a cyst arising from the lower pole of the right kidney measuring 2.2 x 1.8 cm. There is no appreciable hydronephrosis on either side. There is a 2 mm calculus in the lower pole of the right kidney with a nearby 1 mm calculus in this area. On the left, there is a 2 mm calculus in the lower pole region. There is no appreciable ureteral calculus on either side. There is a Foley catheter within a decompressed urinary bladder. The urinary bladder wall appears thickened. Air within the bladder is likely due to iatrogenic introduction with Foley placement. Stomach/Bowel: There is no bowel wall or mesenteric thickening. No bowel obstruction. The terminal ileum appears normal. There is no evident free air or portal venous air. Vascular/Lymphatic: There is no abdominal aortic aneurysm. There is aortic atherosclerosis. No adenopathy is appreciable in the abdomen or pelvis. Reproductive: There are scattered prostatic calculi. Prostate and seminal vesicles appear normal in size and contour. Other: Appendix appears normal. No evident abscess or ascites in the abdomen or pelvis. Musculoskeletal: There are foci of degenerative change in the lumbar spine and lower thoracic spine. No blastic or lytic bone lesions. No intramuscular or abdominal wall lesion evident. IMPRESSION: 1. Urinary bladder decompressed with Foley catheter. There is suspected thickening of urinary bladder wall. 2. 1-2 mm nonobstructing calculi in each kidney. No hydronephrosis or ureteral calculus on either side. 3. No bowel wall thickening or bowel obstruction. No abscess in the abdomen or pelvis. Appendix appears normal. 4.  Aortic Atherosclerosis (ICD10-I70.0). Electronically Signed   By: Bretta Bang III M.D.   On: 10/13/2020 11:40    Procedures Procedures   Medications Ordered in ED Medications - No data to display  ED Course  I have reviewed the triage vital signs and the  nursing notes.  Pertinent labs & imaging results that were available during my care of the patient were reviewed by me and considered in my medical decision making (see chart for details).  Clinical Course as of 10/14/20 0749  Tue Oct 14, 2020  834 60 year old male with complaint of panic attack. Patient was able to void upon arrival in the ER and no longer complains of retention but feels that his anxiety is causing his retention and is requesting, initially 1-2 pills rx to take until he can see his provider, then at dc planning requesting 2-3. Database reviewed, patient was previously prescribed Klonopin, has not  filled this prescription since April. Patient is driving today and is needing to be dc to follow up with his therapist as scheduled at 9:30AM today.  Plan is to dc with rx for 0.5mg  Ativan, #3, for him to take when not driving/safe. With plan to discuss further management with his provider.  [LM]    Clinical Course User Index [LM] Alden HippMurphy, Chantell Kunkler A, PA-C   MDM Rules/Calculators/A&P                          Final Clinical Impression(s) / ED Diagnoses Final diagnoses:  Anxiety    Rx / DC Orders ED Discharge Orders         Ordered    LORazepam (ATIVAN) 0.5 MG tablet  3 times daily PRN        10/14/20 0729           Jeannie FendMurphy, Deshannon Seide A, PA-C 10/14/20 [id number]    Mancel BaleWentz, Elliott, MD 10/14/20 1615

## 2020-10-14 NOTE — ED Triage Notes (Signed)
Patient arrived stating yesterday he had urinary retention and had a catheter placed and removed, arrived again stating he has not urinated since. Patient declines any pain but states he is having a panic attack.

## 2020-10-14 NOTE — Discharge Instructions (Addendum)
Follow up with your therapist today as planned. Take Ativan as needed as prescribed.

## 2020-10-14 NOTE — ED Notes (Signed)
Pt rx was sent to pharmacy and advised pt to pick up med and go to scheduled therapy appt today. Pt understood, gave pt water, pt said thank you and did not have any questions

## 2020-10-14 NOTE — ED Notes (Signed)
Pt urinated on floor. Will attempt UA again. Pt aware

## 2020-10-15 ENCOUNTER — Other Ambulatory Visit: Payer: Self-pay

## 2020-10-15 ENCOUNTER — Emergency Department (HOSPITAL_COMMUNITY)
Admission: EM | Admit: 2020-10-15 | Discharge: 2020-10-16 | Disposition: A | Discharge status: Other Acute Inpatient Hospital | Payer: Self-pay | Attending: Emergency Medicine | Admitting: Emergency Medicine

## 2020-10-15 DIAGNOSIS — B20 Human immunodeficiency virus [HIV] disease: Secondary | ICD-10-CM | POA: Insufficient documentation

## 2020-10-15 DIAGNOSIS — F333 Major depressive disorder, recurrent, severe with psychotic symptoms: Secondary | ICD-10-CM | POA: Insufficient documentation

## 2020-10-15 DIAGNOSIS — Z20822 Contact with and (suspected) exposure to covid-19: Secondary | ICD-10-CM | POA: Insufficient documentation

## 2020-10-15 DIAGNOSIS — Z760 Encounter for issue of repeat prescription: Secondary | ICD-10-CM | POA: Insufficient documentation

## 2020-10-15 DIAGNOSIS — F322 Major depressive disorder, single episode, severe without psychotic features: Secondary | ICD-10-CM | POA: Diagnosis present

## 2020-10-15 DIAGNOSIS — Z79899 Other long term (current) drug therapy: Secondary | ICD-10-CM | POA: Insufficient documentation

## 2020-10-15 LAB — RESP PANEL BY RT-PCR (FLU A&B, COVID) ARPGX2
Influenza A by PCR: NEGATIVE
Influenza B by PCR: NEGATIVE
SARS Coronavirus 2 by RT PCR: NEGATIVE

## 2020-10-15 MED ORDER — ACETAMINOPHEN 325 MG PO TABS
650.0000 mg | ORAL_TABLET | ORAL | Status: DC | PRN
  Administered 2020-10-15 – 2020-10-16 (×3): 650 mg via ORAL
  Filled 2020-10-15 (×4): qty 2

## 2020-10-15 MED ORDER — LORAZEPAM 1 MG PO TABS
1.0000 mg | ORAL_TABLET | ORAL | Status: AC | PRN
  Administered 2020-10-15: 1 mg via ORAL
  Filled 2020-10-15: qty 1

## 2020-10-15 MED ORDER — STERILE WATER FOR INJECTION IJ SOLN
INTRAMUSCULAR | Status: AC
  Administered 2020-10-15: 10 mL
  Filled 2020-10-15: qty 10

## 2020-10-15 MED ORDER — ZIPRASIDONE MESYLATE 20 MG IM SOLR
20.0000 mg | INTRAMUSCULAR | Status: AC | PRN
  Administered 2020-10-15: 20 mg via INTRAMUSCULAR
  Filled 2020-10-15: qty 20

## 2020-10-15 MED ORDER — RISPERIDONE 1 MG PO TBDP
2.0000 mg | ORAL_TABLET | Freq: Three times a day (TID) | ORAL | Status: DC | PRN
  Administered 2020-10-15 – 2020-10-16 (×2): 2 mg via ORAL
  Filled 2020-10-15 (×2): qty 2

## 2020-10-15 NOTE — ED Provider Notes (Signed)
Care assumed from [place], [name], at shift change, please see their notes for full documentation of patient's complaint/HPI. Briefly, pt here with anxiety, restlessness, SI, and auditory hallucinations. Pt had labwork done 2 days ago; no change in symptoms and therefore pt medically cleared today without labwork. Awaiting TTS eval. Plan is to dispo acccordingly.   Physical Exam  BP 113/74 (BP Location: Right Arm)   Pulse (!) 105   Temp 97.7 F (36.5 C) (Oral)   Resp 16   Ht 6\' 4"  (1.93 m)   Wt 87.1 kg   SpO2 97%   BMI 23.37 kg/m   Physical Exam Vitals and nursing note reviewed.  Constitutional:      Appearance: He is not ill-appearing.  HENT:     Head: Normocephalic and atraumatic.  Eyes:     Conjunctiva/sclera: Conjunctivae normal.  Cardiovascular:     Rate and Rhythm: Normal rate and regular rhythm.  Pulmonary:     Effort: Pulmonary effort is normal.     Breath sounds: Normal breath sounds.  Skin:    General: Skin is warm and dry.     Coloration: Skin is not jaundiced.  Neurological:     Mental Status: He is alert.     ED Course/Procedures   Clinical Course as of 10/15/20 2212  Wed Oct 15, 2020  1035 Patient resting comfortably in bed. [SJ]    Clinical Course User Index [SJ] Joy, Shawn C, PA-C    Procedures  MDM   TTS called multiple times by nursing staff to determine when pt was going to be seen. Care delayed at pt received geodon earlier this morning. He is now easily arousable and awaiting eval. Oncoming team to dispo him accordingly.       08-01-1993, PA-C 10/15/20 2214    2215, MD 10/19/20 1504

## 2020-10-15 NOTE — ED Triage Notes (Signed)
Pt came from home via EMS. Pt is deaf. C/C: pt wrote a note stating "Please call 911. I am experiencing delusion/ seeing things/ feel ants walking all over me. I'm thinking about hurting myself. Medication messed up!!" PMH of panic attack, depression, aggression.  CBG: 183 Pt states he has difficulty urinating.

## 2020-10-15 NOTE — Medical Student Note (Addendum)
[facility]-EMERGENCY DEPT Provider Student Note For educational purposes for Medical, PA and NP students only and not part of the legal medical record.   CSN: [id number] Arrival date & time: 10/15/20  4709  History   Chief Complaint Chief Complaint  Patient presents with  . Hallucinations  . Suicidal  . Medication Refill    HPI Tyler Horn is a 60 y.o. male with PMHx significant for anxiety, depression, suicidal ideations, HIV who presents with agitation.  Sign language interpreter used for interview and physical exam. Patient is pacing the room throughout interview. He states that he ran out of his medications at the end of January. He was to have a follow-up meeting with psychiatry in March but ran out of medications before then. He states he takes Lexapro, Ativan, Seroquel and Hydroxyzine.   He states he has been extremely restless, unable to sleep and pacing around for quite some time. Endorses tha the feels like ants are crawling on him, hearing voices, and feels tense. He states he picked up a knife this morning to hurt himself but put it back down and had a friend to call 911.   Of note he was seen in ED on 10/13/2020 and yesterday, 10/14/2020 for urinary symptoms and anxiety, respectively. With regard to urinary complaint, had bladder scan with only 20-19mL, foley placed with 30cc urine removed. This did not improve his symptoms. At that time kidney function WNL, no signs of infection and UA with large blood. They did CT which showed bilateral nephrolithiasis. He was symptom free at time of discharge. With regard to anxiety and presentation on 10/14/2020 stated that he was having a panic attack. State he felt his panic attacks were keeping him from voiding. Patient was to see psychiatry on 10/14/2020 but that his psychiatrist will no longer prescribe him his medications. No SI/HI then.   Past Medical History:  Diagnosis Date  . Anxiety   . Depression   . HIV (human immunodeficiency  virus infection) (HCC)   . HIV (human immunodeficiency virus infection) [facility])     Patient Active Problem List   Diagnosis Date Noted  . Vision changes 07/09/2020  . Weight loss 07/09/2020  . Chronic hepatitis C (HCC) 04/27/2020  . Idiopathic peripheral neuropathy 04/27/2020  . Herpes simplex 04/27/2020  . Herpes zoster 04/27/2020  . Healthcare maintenance 12/19/2019  . 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 Post-traumatic stress disorder, unspecified 11/28/2019  . 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 Diarrhea   . Anxiety state   . Severe recurrent major depression without psychotic features (HCC) 07/13/2017  . Major depressive disorder, recurrent, severe without psychotic behavior (HCC) 07/13/2017  . MDD (major depressive disorder), recurrent, severe, with psychosis (HCC) 07/31/2014  . Benzodiazepine misuse 07/31/2014  . Human immunodeficiency virus infection (HCC)   . Severe major depression (HCC) 07/30/2014  . Delusional disorder (HCC) 07/30/2014  . Suicidal thoughts 07/30/2014  . Hallucinations   . Major depressive disorder, recurrent episode (HCC) 10/03/2013  . Anxiety, generalized 03/26/2013  . Generalized anxiety disorder 03/26/2013    Past Surgical History:  Procedure Laterality Date  . CATARACT EXTRACTION Left      Home Medications    Prior to Admission medications   Medication Sig Start Date End Date Taking? Authorizing Provider  bictegravir-emtricitabine-tenofovir AF (BIKTARVY) 50-200-25 MG TABS tablet Take 1 tablet by mouth daily. 07/09/20   Veryl Speak, FNP  cholecalciferol (VITAMIN D3) 25 MCG (1000 UNIT) tablet Take  1,000 Units by mouth daily.    [provider]  clonazePAM (KLONOPIN) 0.5 MG tablet Take by mouth. Patient not taking: No sig reported 12/26/19   [provider]  escitalopram (LEXAPRO) 20 MG tablet Take 20 mg by mouth daily. 06/09/20   [provider]  hydrOXYzine (ATARAX/VISTARIL) 25 MG tablet Take 1 tablet (25 mg total) by mouth 3 (three) times daily as needed for anxiety. 10/06/20   Veryl Speak, FNP  ibuprofen (ADVIL) 100 MG tablet Take 100 mg by mouth every 6 (six) hours as needed for fever.    [provider]  Lactobacillus (PROBIOTIC ACIDOPHILUS PO) Take 1 tablet by mouth daily.    [provider]  LORazepam (ATIVAN) 0.5 MG tablet Take 1 tablet (0.5 mg total) by mouth 3 (three) times daily as needed for up to 3 doses for anxiety. 10/14/20   Jeannie Fend, PA-C  Omega-3 Fatty Acids (FISH OIL) 1000 MG CAPS Take 1,000 mg by mouth daily.    [provider]  prazosin (MINIPRESS) 1 MG capsule Take 1 capsule (1 mg total) by mouth at bedtime. Patient not taking: No sig reported 12/03/19   Money, Gerlene Burdock, FNP  psyllium (METAMUCIL) 58.6 % packet Take 1 packet by mouth daily.    [provider]  QUEtiapine (SEROQUEL) 200 MG tablet TAKE 1 TABLET BY MOUTH AT BEDTIME Patient taking differently: Take 200 mg by mouth at bedtime. 08/25/20   Veryl Speak, FNP    Family History Family History  Problem Relation Age of Onset  . Mental illness Mother   . Bipolar disorder Brother     Social History Social History   Tobacco Use  . Smoking status: Never Smoker  . Smokeless tobacco: Never Used  Substance Use Topics  . Alcohol use: No  . Drug use: No   Allergies   Patient has no known allergies.   Review of Systems Review of Systems  Constitutional: Positive for diaphoresis. Negative for fever.  HENT: Negative.   Eyes: Negative.   Respiratory: Negative.   Cardiovascular: Negative.   Gastrointestinal: Negative.   Endocrine: Negative.   Genitourinary: Positive for difficulty urinating and urgency.  Musculoskeletal: Negative.   Skin: Negative.   Allergic/Immunologic: Negative.   Neurological: Negative.   Hematological: Negative.   Psychiatric/Behavioral: Positive for agitation,  hallucinations, sleep disturbance and suicidal ideas. The patient is nervous/anxious and is hyperactive.      Physical Exam Updated Vital Signs BP 117/74 (BP Location: Left Arm)   Pulse (!) 108   Temp 97.6 F (36.4 C) (Oral)   Resp 10   Ht 6\' 4"  (1.93 m)   Wt 87.1 kg   SpO2 96%   BMI 23.37 kg/m   Physical Exam Vitals and nursing note reviewed.  Constitutional:      General: He is in acute distress.     Appearance: He is not toxic-appearing.  HENT:     Head: Normocephalic and atraumatic.  Eyes:     Extraocular Movements: Extraocular movements intact.     Pupils: Pupils are equal, round, and reactive to light.  Cardiovascular:     Rate and Rhythm: Tachycardia present.  Pulmonary:     Effort: Pulmonary effort is normal.  Abdominal:     General: Abdomen is flat.     Palpations: Abdomen is soft.  Musculoskeletal:        General: Normal range of motion.     Cervical back: Normal range of motion.  Skin:  General: Skin is warm and dry.     Capillary Refill: Capillary refill takes less than 2 seconds.  Neurological:     General: No focal deficit present.     Mental Status: He is alert and oriented to person, place, and time.  Psychiatric:        Attention and Perception: He perceives visual hallucinations.        Mood and Affect: Mood is anxious. Affect is labile.        Speech: Speech is rapid and pressured.        Behavior: Behavior is agitated and hyperactive.        Thought Content: Thought content is delusional. Thought content includes suicidal ideation.        Cognition and Memory: Memory normal.        Judgment: Judgment is impulsive.    ED Treatments / Results  Labs (all labs ordered are listed, but only abnormal results are displayed) Labs Reviewed  RESP PANEL BY RT-PCR (FLU A&B, COVID) [id number]   EKG Sinus arrhythmia   Radiology CT Abdomen Pelvis Wo Contrast  Result Date: 10/13/2020 CLINICAL DATA:  Abdominal pain and dysuria EXAM: CT ABDOMEN AND  PELVIS WITHOUT CONTRAST TECHNIQUE: Multidetector CT imaging of the abdomen and pelvis was performed following the standard protocol without oral or IV contrast. COMPARISON:  None. FINDINGS: Lower chest: There is bibasilar lung atelectatic change. There is no lung base edema or consolidation. Hepatobiliary: No focal liver lesions are appreciable on this noncontrast enhanced study. Gallbladder wall is not appreciably thickened. There is no evident biliary duct dilatation. 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 Pancreas: No pancreatic mass or inflammatory focus evident. Spleen: No splenic lesions evident. Adrenals/Urinary Tract: Adrenals bilaterally appear normal. There is a cyst arising from the lower pole of the right kidney measuring 2.2 x 1.8 cm. There is no appreciable hydronephrosis on either side. There is a 2 mm calculus in the lower pole of the right kidney with a nearby 1 mm calculus in this area. On the left, there is a 2 mm calculus in the lower pole region. There is no appreciable ureteral calculus on either side. There is a Foley catheter within a decompressed urinary bladder. The urinary bladder wall appears thickened. Air within the bladder is likely due to iatrogenic introduction with Foley placement. Stomach/Bowel: There is no bowel wall or mesenteric thickening. No bowel obstruction. The terminal ileum appears normal. There is no evident free air or portal venous air. Vascular/Lymphatic: There is no abdominal aortic aneurysm. There is aortic atherosclerosis. No adenopathy is appreciable in the abdomen or pelvis. Reproductive: There are scattered prostatic calculi. Prostate and seminal vesicles appear normal in size and contour. Other: Appendix appears normal. No evident abscess or ascites in the abdomen or pelvis. Musculoskeletal: There are foci of degenerative change in the lumbar spine and lower thoracic spine. No blastic or lytic bone  lesions. No intramuscular or abdominal wall lesion evident. IMPRESSION: 1. Urinary bladder decompressed with Foley catheter. There is suspected thickening of urinary bladder wall. 2. 1-2 mm nonobstructing calculi in each kidney. No hydronephrosis or ureteral calculus on either side. 3. No bowel wall thickening or bowel obstruction. No abscess in the abdomen or pelvis. Appendix appears normal. 4.  Aortic Atherosclerosis (ICD10-I70.0). Electronically Signed   By: Bretta BangWilliam  Woodruff III M.D.   On: 10/13/2020 11:40   Procedures Procedures (including critical care time)  Medications Ordered in ED Medications  risperiDONE (  RISPERDAL M-TABS) disintegrating tablet 2 mg (2 mg Oral Given 10/15/20 1532)    And  LORazepam (ATIVAN) tablet 1 mg (1 mg Oral Given 10/15/20 0925)    And  ziprasidone (GEODON) injection 20 mg (20 mg Intramuscular Given 10/15/20 0925)  acetaminophen (TYLENOL) tablet 650 mg (650 mg Oral Given 10/15/20 0948)  sterile water (preservative free) injection (10 mLs  Given 10/15/20 2094)   Initial Impression / Assessment and Plan / ED Course  I have reviewed the triage vital signs and the nursing notes.  Pertinent labs & imaging results that were available during my care of the patient were reviewed by me and considered in my medical decision making (see chart for details).  Clinical Course as of 10/15/20 1609  Wed Oct 15, 2020  1035 Patient resting comfortably in bed. [SJ]    Clinical Course User Index [SJ] Anselm Pancoast, PA-C   Eathon Valade is a 70JGG with PMH listed above who presents with psychosis.   Differential includes medication withdrawal, psychosis 2/2 delusional disorder and MDD with psychosis, AIDS Mania.   Unlikely AIDS mania given last CD4 on 07/08/2020 was 667. Does not have history of bipolar disorder. Compliant on ART therapy.  Active psychosis likely 2/2 no refills on his medicines. Do not suspect organic cause of psychosis. Given 20mg  IM Geodon, 1mg  ativan PO. Now  resting comfortably. Will reassess.  Final Clinical Impressions(s) / ED Diagnoses   Final diagnoses:  None    New Prescriptions New Prescriptions   No medications on file

## 2020-10-15 NOTE — Discharge Planning (Signed)
RNCM following for disposition needs. °Cammy Sanjurjo J. Sharis Keeran, RN, BSN, NCM [phone] ° °

## 2020-10-15 NOTE — BH Assessment (Signed)
Per pt's nurse, pt was given medication for agitation and can't participate in TTS assessment at this time. 
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
## 2020-10-15 NOTE — Progress Notes (Signed)
TTS in progress.  Sign language Interpreter at bedside via iPad.
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
## 2020-10-15 NOTE — ED Provider Notes (Signed)
[facility]-EMERGENCY DEPT Provider Note   CSN: [id number] Arrival date & time: 10/15/20  3343     History Chief Complaint  Patient presents with  . Hallucinations  . Suicidal  . Medication Refill    Marquez Ceesay is a 60 y.o. male.  A language interpreter was used (ASL).        Shepherd Finnan is a 61 y.o. male, with a history of HIV, anxiety, depression, presenting to the ED with complaint of anxiety, restlessness, suicidal ideations, hearing voices. He also has complaints of urinary frequency and sensation of urinary retention for the last 2 to 3 weeks.  Plan:  Patient states, "I have been having thoughts of hurting myself.  This morning, I picked up a knife to cut myself, but then put it down and called for help."  Medication compliance: States he ran out of his medications the last week in January.  He called his psychiatrist's office, his psychiatrist was reportedly unavailable, and the doctor that was available told him he would need to be seen prior to them refilling his medications.  Denies fever, chest pain, shortness of breath, abdominal pain, dysuria, hematuria, N/V/D, self-harm, or any other complaints.      Last CD4 count 667 and last HIV quant undetectable, both July 08, 2020.  Past Medical History:  Diagnosis Date  . Anxiety   . Depression   . HIV (human immunodeficiency virus infection) (HCC)   . HIV (human immunodeficiency virus infection) [facility])     Patient Active Problem List   Diagnosis Date Noted  . Vision changes 07/09/2020  . Weight loss 07/09/2020  . Chronic hepatitis C (HCC) 04/27/2020  . Idiopathic peripheral neuropathy 04/27/2020  . Herpes simplex 04/27/2020  . Herpes zoster 04/27/2020  . Healthcare maintenance 12/19/2019  . 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 Post-traumatic stress disorder, unspecified 11/28/2019  . 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 Diarrhea   . Anxiety state   . Severe recurrent major depression without psychotic features (HCC) 07/13/2017  . Major depressive disorder, recurrent, severe without psychotic behavior (HCC) 07/13/2017  . MDD (major depressive disorder), recurrent, severe, with psychosis (HCC) 07/31/2014  . Benzodiazepine misuse 07/31/2014  . Human immunodeficiency virus infection (HCC)   . Severe major depression (HCC) 07/30/2014  . Delusional disorder (HCC) 07/30/2014  . Suicidal thoughts 07/30/2014  . Hallucinations   . Major depressive disorder, recurrent episode (HCC) 10/03/2013  . Anxiety, generalized 03/26/2013  . Generalized anxiety disorder 03/26/2013    Past Surgical History:  Procedure Laterality Date  . CATARACT EXTRACTION Left        Family History  Problem Relation Age of Onset  . Mental illness Mother   . Bipolar disorder Brother     Social History   Tobacco Use  . Smoking status: Never Smoker  . Smokeless tobacco: Never Used  Substance Use Topics  . Alcohol use: No  . Drug use: No    Home Medications Prior to Admission medications   Medication Sig Start Date End Date Taking? Authorizing Provider  bictegravir-emtricitabine-tenofovir AF (BIKTARVY) 50-200-25 MG TABS tablet Take 1 tablet by mouth daily. 07/09/20  Yes Veryl Speak, FNP  cholecalciferol (VITAMIN D3) 25 MCG (1000 UNIT) tablet Take 1,000 Units by mouth daily.   Yes [provider]  escitalopram (LEXAPRO) 20 MG tablet Take 20 mg by mouth daily. 06/09/20  Yes [provider]  ibuprofen (ADVIL) 100 MG tablet Take 100 mg  by mouth every 6 (six) hours as needed for fever.   Yes [provider]  Lactobacillus (PROBIOTIC ACIDOPHILUS PO) Take 1 tablet by mouth daily.   Yes [provider]  LORazepam (ATIVAN) 0.5 MG tablet Take 1 tablet (0.5 mg total) by mouth 3 (three) times daily as needed for up to 3 doses for anxiety. 10/14/20  Yes Jeannie FendMurphy, Laura A, PA-C  Omega-3  Fatty Acids (FISH OIL) 1000 MG CAPS Take 1,000 mg by mouth daily.   Yes [provider]  psyllium (METAMUCIL) 58.6 % packet Take 1 packet by mouth daily.   Yes [provider]  QUEtiapine (SEROQUEL) 200 MG tablet TAKE 1 TABLET BY MOUTH AT BEDTIME Patient taking differently: Take 200 mg by mouth at bedtime. 08/25/20  Yes Veryl Speakalone, Gregory D, FNP  hydrOXYzine (ATARAX/VISTARIL) 25 MG tablet Take 1 tablet (25 mg total) by mouth 3 (three) times daily as needed for anxiety. 10/06/20   Veryl Speakalone, Gregory D, FNP  prazosin (MINIPRESS) 1 MG capsule Take 1 capsule (1 mg total) by mouth at bedtime. Patient not taking: No sig reported 12/03/19   Money, Gerlene Burdockravis B, FNP    Allergies    Patient has no known allergies.  Review of Systems   Review of Systems  Constitutional: Negative for diaphoresis and fever.  Respiratory: Negative for cough and shortness of breath.   Cardiovascular: Negative for chest pain.  Gastrointestinal: Negative for abdominal pain, blood in stool, diarrhea, nausea and vomiting.  Genitourinary: Positive for difficulty urinating. Negative for dysuria, flank pain, hematuria, scrotal swelling and testicular pain.  Musculoskeletal: Positive for back pain.  Neurological: Negative for dizziness, syncope and weakness.  Psychiatric/Behavioral: Positive for suicidal ideas. The patient is nervous/anxious.   All other systems reviewed and are negative.   Physical Exam Updated Vital Signs BP (!) 119/57 (BP Location: Left Arm)   Pulse 89   Temp 97.6 F (36.4 C) (Oral)   Resp 18   Ht 6\' 4"  (1.93 m)   Wt 87.1 kg   SpO2 98%   BMI 23.37 kg/m   Physical Exam Vitals and nursing note reviewed.  Constitutional:      General: He is not in acute distress.    Appearance: He is well-developed. He is not diaphoretic.  HENT:     Head: Normocephalic and atraumatic.     Mouth/Throat:     Mouth: Mucous membranes are moist.     Pharynx: Oropharynx is clear.  Eyes:      Conjunctiva/sclera: Conjunctivae normal.  Cardiovascular:     Rate and Rhythm: Normal rate and regular rhythm.     Pulses: Normal pulses.          Radial pulses are 2+ on the right side and 2+ on the left side.       Posterior tibial pulses are 2+ on the right side and 2+ on the left side.     Heart sounds: Normal heart sounds.     Comments: Tactile temperature in the extremities appropriate and equal bilaterally. Pulmonary:     Effort: Pulmonary effort is normal. No respiratory distress.     Breath sounds: Normal breath sounds.  Abdominal:     Palpations: Abdomen is soft.     Tenderness: There is no abdominal tenderness. There is no guarding.     Comments: Benign abdominal exam.  Musculoskeletal:     Cervical back: Neck supple.     Right lower leg: No edema.     Left lower leg: No edema.  Lymphadenopathy:     Cervical: No cervical adenopathy.  Skin:    General: Skin is warm and dry.  Neurological:     Mental Status: He is alert.  Psychiatric:        Mood and Affect: Mood is anxious.        Speech: Speech is rapid and pressured (through hand gestures and sign language).        Behavior: Behavior is agitated and hyperactive.        Thought Content: Thought content includes suicidal ideation. Thought content includes suicidal plan.     ED Results / Procedures / Treatments   Labs (all labs ordered are listed, but only abnormal results are displayed) Labs Reviewed  RESP PANEL BY RT-PCR (FLU A&B, COVID) [id number]    EKG EKG Interpretation  Date/Time:  Wednesday October 15 2020 09:35:08 EST Ventricular Rate:  104 PR Interval:    QRS Duration: 90 QT Interval:  342 QTC Calculation: 450 R Axis:   84 Text Interpretation: Sinus tachycardia Multiform ventricular premature complexes Aberrant conduction of SV complex(es) No acute changes No significant change since last tracing Confirmed by Derwood Kaplan [id number]) on 10/15/2020 1:28:59 PM   Radiology No results  found.  Procedures Procedures   Medications Ordered in ED Medications  risperiDONE (RISPERDAL M-TABS) disintegrating tablet 2 mg (2 mg Oral Given 10/15/20 1532)    And  LORazepam (ATIVAN) tablet 1 mg (1 mg Oral Given 10/15/20 0925)    And  ziprasidone (GEODON) injection 20 mg (20 mg Intramuscular Given 10/15/20 0925)  acetaminophen (TYLENOL) tablet 650 mg (650 mg Oral Given 10/15/20 0948)  sterile water (preservative free) injection (10 mLs  Given 10/15/20 4010)    ED Course  I have reviewed the triage vital signs and the nursing notes.  Pertinent labs & imaging results that were available during my care of the patient were reviewed by me and considered in my medical decision making (see chart for details).  Clinical Course as of 10/15/20 1736  Wed Oct 15, 2020  1035 Patient resting comfortably in bed. [SJ]    Clinical Course User Index [SJ] Irie Fiorello, Hillard Danker, PA-C   MDM Rules/Calculators/A&P                          Patient presents with anxiety, agitation, suicidal ideations, auditory hallucinations. He states the symptoms have been worsening for the last couple weeks since he has been off his medications. Patient was unable to be evaluated by behavioral health during my shift since he was given Geodon due to his agitation.  At the end of my shift, care was handed off to [employer], [name]. Plan: Patient waiting to be evaluated by psych team.   Findings and plan of care discussed with attending physician, Derwood Kaplan, MD. Dr. Rhunette Croft personally evaluated and examined this patient.  Chart review reveals patient was seen February 14.  He had complaints of urinary retention and urinary frequency at that time.  UA showed "large" hemoglobin, but urine culture returned without growth.  Creatinine 1.09 at that time.  Foley catheter was placed due to patient's complaint of urinary retention, with 30 cc urine drained.  CT abdomen/pelvis on the same day revealed decompressed bladder,  possible bladder wall thickening.  No hydronephrosis or ureteral calculi.  Foley catheter was removed and patient was able to urinate. Patient has been able to urinate here in the ED today. Patient was also seen in the ED yesterday.  Psych was not consulted at that time as the patient was noted to have a counseling appointment scheduled for later that morning.  Final Clinical Impression(s) / ED Diagnoses Final diagnoses:  None    Rx / DC Orders ED Discharge Orders    None       Concepcion Living 10/15/20 1742    Derwood Kaplan, MD 10/16/20 [phone]

## 2020-10-15 NOTE — ED Notes (Signed)
Gave bedside report to [place], Charity fundraiser in [employer]

## 2020-10-15 NOTE — ED Triage Notes (Signed)
Pt states he feels like hurting himself today. His plan this morning was to cut himself with a kitchen knife. Pt states "I picked up the knife this morning to hurt myself but put it back down and asked a friend to call 911"

## 2020-10-16 ENCOUNTER — Encounter (HOSPITAL_COMMUNITY): Payer: Self-pay | Admitting: Family

## 2020-10-16 ENCOUNTER — Inpatient Hospital Stay (HOSPITAL_COMMUNITY)
Admission: AD | Admit: 2020-10-16 | Discharge: 2020-10-23 | DRG: 885 | Disposition: A | Discharge status: Home / Self Care | Payer: Federal, State, Local not specified - Other | Source: Intra-hospital | Attending: Psychiatry | Admitting: Psychiatry

## 2020-10-16 DIAGNOSIS — Z8616 Personal history of COVID-19: Secondary | ICD-10-CM

## 2020-10-16 DIAGNOSIS — R45851 Suicidal ideations: Secondary | ICD-10-CM | POA: Diagnosis present

## 2020-10-16 DIAGNOSIS — N401 Enlarged prostate with lower urinary tract symptoms: Secondary | ICD-10-CM | POA: Diagnosis present

## 2020-10-16 DIAGNOSIS — Z818 Family history of other mental and behavioral disorders: Secondary | ICD-10-CM

## 2020-10-16 DIAGNOSIS — H905 Unspecified sensorineural hearing loss: Secondary | ICD-10-CM | POA: Diagnosis present

## 2020-10-16 DIAGNOSIS — Z21 Asymptomatic human immunodeficiency virus [HIV] infection status: Secondary | ICD-10-CM | POA: Diagnosis present

## 2020-10-16 DIAGNOSIS — F4310 Post-traumatic stress disorder, unspecified: Secondary | ICD-10-CM | POA: Diagnosis present

## 2020-10-16 DIAGNOSIS — F411 Generalized anxiety disorder: Secondary | ICD-10-CM | POA: Diagnosis present

## 2020-10-16 DIAGNOSIS — F333 Major depressive disorder, recurrent, severe with psychotic symptoms: Principal | ICD-10-CM | POA: Diagnosis present

## 2020-10-16 DIAGNOSIS — Z79899 Other long term (current) drug therapy: Secondary | ICD-10-CM | POA: Diagnosis not present

## 2020-10-16 DIAGNOSIS — G4700 Insomnia, unspecified: Secondary | ICD-10-CM | POA: Diagnosis present

## 2020-10-16 DIAGNOSIS — B9620 Unspecified Escherichia coli [E. coli] as the cause of diseases classified elsewhere: Secondary | ICD-10-CM | POA: Diagnosis present

## 2020-10-16 DIAGNOSIS — N390 Urinary tract infection, site not specified: Secondary | ICD-10-CM | POA: Diagnosis present

## 2020-10-16 DIAGNOSIS — F332 Major depressive disorder, recurrent severe without psychotic features: Secondary | ICD-10-CM | POA: Diagnosis present

## 2020-10-16 DIAGNOSIS — R3911 Hesitancy of micturition: Secondary | ICD-10-CM | POA: Diagnosis present

## 2020-10-16 DIAGNOSIS — R338 Other retention of urine: Secondary | ICD-10-CM | POA: Diagnosis present

## 2020-10-16 DIAGNOSIS — Z1611 Resistance to penicillins: Secondary | ICD-10-CM | POA: Diagnosis present

## 2020-10-16 LAB — RAPID URINE DRUG SCREEN, HOSP PERFORMED
Amphetamines: NOT DETECTED
Barbiturates: NOT DETECTED
Benzodiazepines: POSITIVE — AB
Cocaine: NOT DETECTED
Opiates: NOT DETECTED
Tetrahydrocannabinol: NOT DETECTED

## 2020-10-16 MED ORDER — MAGNESIUM HYDROXIDE 400 MG/5ML PO SUSP: 30.0000 mL | Freq: Every day | ORAL | Status: DC | PRN

## 2020-10-16 MED ORDER — VITAMIN D 25 MCG (1000 UNIT) PO TABS
1000.0000 [IU] | ORAL_TABLET | Freq: Every day | ORAL | Status: DC
  Administered 2020-10-16: 1000 [IU] via ORAL
  Filled 2020-10-16: qty 1

## 2020-10-16 MED ORDER — BICTEGRAVIR-EMTRICITAB-TENOFOV 50-200-25 MG PO TABS
1.0000 | ORAL_TABLET | Freq: Every day | ORAL | Status: DC
  Administered 2020-10-17 – 2020-10-23 (×7): 1 via ORAL
  Filled 2020-10-16 (×8): qty 1

## 2020-10-16 MED ORDER — MELATONIN 5 MG PO TABS
5.0000 mg | ORAL_TABLET | Freq: Every day | ORAL | Status: DC
  Administered 2020-10-17 – 2020-10-20 (×4): 5 mg via ORAL
  Filled 2020-10-16 (×7): qty 1

## 2020-10-16 MED ORDER — QUETIAPINE FUMARATE 100 MG PO TABS
200.0000 mg | ORAL_TABLET | Freq: Every day | ORAL | Status: DC
Start: 2020-10-16 — End: 2020-10-16

## 2020-10-16 MED ORDER — HYDROXYZINE HCL 25 MG PO TABS
25.0000 mg | ORAL_TABLET | Freq: Three times a day (TID) | ORAL | Status: DC | PRN
  Administered 2020-10-16: 25 mg via ORAL
  Filled 2020-10-16: qty 1

## 2020-10-16 MED ORDER — ZIPRASIDONE MESYLATE 20 MG IM SOLR: 20.0000 mg | Freq: Once | INTRAMUSCULAR | Status: DC

## 2020-10-16 MED ORDER — ALUM & MAG HYDROXIDE-SIMETH 200-200-20 MG/5ML PO SUSP: 30.0000 mL | ORAL | Status: DC | PRN

## 2020-10-16 MED ORDER — VITAMIN D3 25 MCG PO TABS
1000.0000 [IU] | ORAL_TABLET | Freq: Every day | ORAL | Status: DC
  Administered 2020-10-17 – 2020-10-23 (×7): 1000 [IU] via ORAL
  Filled 2020-10-16 (×8): qty 1

## 2020-10-16 MED ORDER — ESCITALOPRAM OXALATE 10 MG PO TABS
20.0000 mg | ORAL_TABLET | Freq: Every day | ORAL | Status: DC
  Administered 2020-10-16: 20 mg via ORAL
  Filled 2020-10-16: qty 2

## 2020-10-16 MED ORDER — MELATONIN 5 MG PO TABS
5.0000 mg | ORAL_TABLET | Freq: Every day | ORAL | Status: DC
  Administered 2020-10-16: 5 mg via ORAL
  Filled 2020-10-16: qty 1

## 2020-10-16 MED ORDER — ESCITALOPRAM OXALATE 20 MG PO TABS
20.0000 mg | ORAL_TABLET | Freq: Every day | ORAL | Status: DC
  Administered 2020-10-17: 20 mg via ORAL
  Filled 2020-10-16 (×2): qty 1

## 2020-10-16 MED ORDER — BICTEGRAVIR-EMTRICITAB-TENOFOV 50-200-25 MG PO TABS
1.0000 | ORAL_TABLET | Freq: Every day | ORAL | Status: DC
  Administered 2020-10-16: 1 via ORAL
  Filled 2020-10-16: qty 1

## 2020-10-16 MED ORDER — QUETIAPINE FUMARATE 200 MG PO TABS
200.0000 mg | ORAL_TABLET | Freq: Every day | ORAL | Status: DC
  Administered 2020-10-16: 200 mg via ORAL
  Filled 2020-10-16 (×3): qty 1

## 2020-10-16 MED ORDER — HYDROXYZINE HCL 25 MG PO TABS
25.0000 mg | ORAL_TABLET | Freq: Three times a day (TID) | ORAL | Status: DC | PRN
  Administered 2020-10-16 – 2020-10-17 (×2): 25 mg via ORAL
  Filled 2020-10-16: qty 1

## 2020-10-16 MED ORDER — ACETAMINOPHEN 325 MG PO TABS
650.0000 mg | ORAL_TABLET | Freq: Four times a day (QID) | ORAL | Status: DC | PRN
  Administered 2020-10-16 – 2020-10-19 (×5): 650 mg via ORAL
  Filled 2020-10-16 (×5): qty 2

## 2020-10-16 NOTE — BH Assessment (Signed)
BHH Assessment Progress Note  Per Berneice Heinrich, NP, this voluntary pt requires psychiatric hospitalization at this time.  Brook, RN, [facility] has assigned pt to [facility] Rm 304-1 to the service of Landry Mellow, MD.  [facility] will be ready to receive pt at 20:00.  [name] Melene Plan, DO and pt's nurse, Beth, have been notified.  Beth agrees to have pt sign consent, to send original paperwork along with pt via [employer], and to call report to [phone].  Doylene Canning, [place] [facility] Coordinator [phone]

## 2020-10-16 NOTE — Consult Note (Signed)
Telepsych Consultation   Reason for Consult: Psychiatry provider reassessment Referring Physician: Dr. Dalene Seltzer Location of Patient: [facility] emergency department Location of Provider: Behavioral Health TTS Department  Patient Identification: Tyler Horn MRN:  [id number] Principal Diagnosis: Severe major depression (HCC) Diagnosis:  Principal Problem:   Severe major depression (HCC)   Total Time spent with patient: 30 minutes  Subjective:   Tyler Horn is a 60 y.o. male patient.  Patient states "yesterday I had a knife and I thought about cutting myself, instead I took a walk."  HPI:   Assessment completed with use of sign language interpreter. Patient reports suicidal ideations, times "a few days."  Patient continues to endorse suicidal ideations, denies plan or intent to harm self at this time.  Patient reports recent stressors include loss of his job and insurance approximately 1 year ago.  Patient reports history of suicidal ideations, patient reports he has never had a suicide attempt.  Patient has been diagnosed with major depressive disorder, generalized anxiety disorder and PTSD.  Patient reports he has not had his home medications including Lexapro and Seroquel for several weeks as he did attempt to have medications refilled but [facility] refuses at this time.  Patient resides alone in [place].  Patient denies access to weapons.  Patient receives disability benefits.  Patient denies alcohol and substance use currently.  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  Patient endorses average sleep and decreased appetite.  Patient assessed by nurse practitioner.  Patient alert and oriented, answers appropriately.  Patient endorses visual hallucinations including feeling that he sees ghosts talking to him.  Patient denies command hallucinations.  Patient offered support and encouragement.  Current home medications reinitiated prior to my assessment.   Patient reports hydroxyzine is not effective and believes Ativan is more effective for him, discussed that this would not be the most effective choice at this time, patient verbalizes understanding.  Discussed plan to seek inpatient psychiatric treatment, patient agrees with this plan.  Patient reports four prior hospitalizations related to psychiatry needs.  Past Psychiatric History: Major depressive disorder with psychosis, delusional disorder, suicidal thoughts, hallucinations, benzodiazepine misuse, generalized anxiety, sedative, hypnotic, or anxiolytic dependence, PTSD  Risk to Self:   yes Risk to Others:  denies Prior Inpatient Therapy:   [facility] and [facility] Prior Outpatient Therapy:   Currently followed by Macario Carls   Past Medical History:  Past Medical History:  Diagnosis Date  . Anxiety   . Depression   . HIV (human immunodeficiency virus infection) (HCC)   . HIV (human immunodeficiency virus infection) (HCC)     Past Surgical History:  Procedure Laterality Date  . CATARACT EXTRACTION Left    Family History:  Family History  Problem Relation Age of Onset  . Mental illness Mother   . Bipolar disorder Brother    Family Psychiatric  History: None reported Social History:  Social History   Substance and Sexual Activity  Alcohol Use No     Social History   Substance and Sexual Activity  Drug Use No    Social History   Socioeconomic History  . Marital status: Single    Spouse name: Not on file  . Number of children: Not on file  . Years of education: Not on file  . Highest education level: Not on file  Occupational History  . Not on file  Tobacco Use  . Smoking status: Never Smoker  . Smokeless tobacco: Never Used  Substance and Sexual  Activity  . Alcohol use: No  . Drug use: No  . Sexual activity: Yes  Other Topics Concern  . Not on file  Social History Narrative  . Not on file   Social Determinants of Health   Financial Resource Strain:  Not on file  Food Insecurity: Not on file  Transportation Needs: Not on file  Physical Activity: Not on file  Stress: Not on file  Social Connections: Not on file   Additional Social History:    Allergies:  No Known Allergies  Labs:  Results for orders placed or performed during the hospital encounter of 10/15/20 (from the past 48 hour(s))  Resp Panel by RT-PCR (Flu A&B, Covid) Nasopharyngeal Swab     Status: None   Collection Time: 10/15/20  9:35 AM   Specimen: Nasopharyngeal Swab; Nasopharyngeal(NP) swabs in vial transport medium  Result Value Ref Range   SARS Coronavirus 2 by RT PCR NEGATIVE NEGATIVE    Comment: (NOTE) SARS-CoV-2 target nucleic acids are NOT DETECTED.  The SARS-CoV-2 RNA is generally detectable in upper respiratory specimens during the acute phase of infection. The lowest concentration of SARS-CoV-2 viral copies this assay can detect is 138 copies/mL. A negative result does not preclude SARS-Cov-2 infection and should not be used as the sole basis for treatment or other patient management decisions. A negative result may occur with  improper specimen collection/handling, submission of specimen other than nasopharyngeal swab, presence of viral mutation(s) within the areas targeted by this assay, and inadequate number of viral copies(<138 copies/mL). A negative result must be combined with clinical observations, patient history, and epidemiological information. The expected result is Negative.  Fact Sheet for Patients:  BloggerCourse.com  Fact Sheet for Healthcare Providers:  SeriousBroker.it  This test is no t yet approved or cleared by the [place] FDA and  has been authorized for detection and/or diagnosis of SARS-CoV-2 by FDA under an Emergency Use Authorization (EUA). This EUA will remain  in effect (meaning this test can be used) for the duration of the COVID-19 declaration under Section  564(b)(1) of the Act, 21 U.S.C.section 360bbb-3(b)(1), unless the authorization is terminated  or revoked sooner.       Influenza A by PCR NEGATIVE NEGATIVE   Influenza B by PCR NEGATIVE NEGATIVE    Comment: (NOTE) The Xpert Xpress SARS-CoV-2/FLU/RSV plus assay is intended as an aid in the diagnosis of influenza from Nasopharyngeal swab specimens and should not be used as a sole basis for treatment. Nasal washings and aspirates are unacceptable for Xpert Xpress SARS-CoV-2/FLU/RSV testing.  Fact Sheet for Patients: BloggerCourse.com  Fact Sheet for Healthcare Providers: SeriousBroker.it  This test is not yet approved or cleared by the [place] FDA and has been authorized for detection and/or diagnosis of SARS-CoV-2 by FDA under an Emergency Use Authorization (EUA). This EUA will remain in effect (meaning this test can be used) for the duration of the COVID-19 declaration under Section 564(b)(1) of the Act, 21 U.S.C. section 360bbb-3(b)(1), unless the authorization is terminated or revoked.  Performed at [facility], [address], [place], [place] [address]     Medications:  Current Facility-Administered Medications  Medication Dose Route Frequency Provider Last Rate Last Admin  . acetaminophen (TYLENOL) tablet 650 mg  650 mg Oral Q4H PRN Derwood Kaplan, MD   650 mg at 10/16/20 0017  . bictegravir-emtricitabine-tenofovir AF (BIKTARVY) 50-200-25 MG per tablet 1 tablet  1 tablet Oral Daily Alvira Monday, MD   1 tablet at 10/16/20 1026  .  cholecalciferol (VITAMIN D3) tablet 1,000 Units  1,000 Units Oral Daily Alvira Monday, MD   1,000 Units at 10/16/20 1027  . escitalopram (LEXAPRO) tablet 20 mg  20 mg Oral Daily Alvira Monday, MD   20 mg at 10/16/20 1026  . hydrOXYzine (ATARAX/VISTARIL) tablet 25 mg  25 mg Oral TID PRN Alvira Monday, MD   25 mg at 10/16/20 1026  . melatonin tablet 5 mg  5 mg  Oral QHS Roxy Horseman, PA-C   5 mg at 10/16/20 0033  . QUEtiapine (SEROQUEL) tablet 200 mg  200 mg Oral QHS Schlossman, Erin, MD      . risperiDONE (RISPERDAL M-TABS) disintegrating tablet 2 mg  2 mg Oral Q8H PRN Rhunette Croft, Ankit, MD   2 mg at 10/16/20 0800  . ziprasidone (GEODON) injection 20 mg  20 mg Intramuscular Once Alvira Monday, MD       Current Outpatient Medications  Medication Sig Dispense Refill  . bictegravir-emtricitabine-tenofovir AF (BIKTARVY) 50-200-25 MG TABS tablet Take 1 tablet by mouth daily. 30 tablet 2  . cholecalciferol (VITAMIN D3) 25 MCG (1000 UNIT) tablet Take 1,000 Units by mouth daily.    Marland Kitchen escitalopram (LEXAPRO) 20 MG tablet Take 20 mg by mouth daily.    Marland Kitchen ibuprofen (ADVIL) 100 MG tablet Take 100 mg by mouth every 6 (six) hours as needed for fever.    . Lactobacillus (PROBIOTIC ACIDOPHILUS PO) Take 1 tablet by mouth daily.    Marland Kitchen LORazepam (ATIVAN) 0.5 MG tablet Take 1 tablet (0.5 mg total) by mouth 3 (three) times daily as needed for up to 3 doses for anxiety. 3 tablet 0  . Omega-3 Fatty Acids (FISH OIL) 1000 MG CAPS Take 1,000 mg by mouth daily.    . psyllium (METAMUCIL) 58.6 % packet Take 1 packet by mouth daily.    . QUEtiapine (SEROQUEL) 200 MG tablet TAKE 1 TABLET BY MOUTH AT BEDTIME (Patient taking differently: Take 200 mg by mouth at bedtime.) 30 tablet 1  . hydrOXYzine (ATARAX/VISTARIL) 25 MG tablet Take 1 tablet (25 mg total) by mouth 3 (three) times daily as needed for anxiety. 30 tablet 1  . prazosin (MINIPRESS) 1 MG capsule Take 1 capsule (1 mg total) by mouth at bedtime. (Patient not taking: No sig reported) 30 capsule 1    Musculoskeletal: Strength & Muscle Tone: within normal limits Gait & Station: normal Patient leans: N/A  Psychiatric Specialty Exam: Physical Exam Vitals and nursing note reviewed.  Constitutional:      Appearance: He is well-developed.  HENT:     Head: Normocephalic.  Cardiovascular:     Rate and Rhythm: Normal  rate.  Pulmonary:     Effort: Pulmonary effort is normal.  Neurological:     Mental Status: He is alert and oriented to person, place, and time.  Psychiatric:        Attention and Perception: Attention and perception normal.        Mood and Affect: Affect normal. Mood is depressed.        Speech: Speech normal.        Behavior: Behavior normal. Behavior is cooperative.        Thought Content: Thought content includes suicidal ideation.        Cognition and Memory: Cognition and memory normal.     Review of Systems  Constitutional: Negative.   HENT: Negative.   Eyes: Negative.   Respiratory: Negative.   Cardiovascular: Negative.   Gastrointestinal: Negative.   Genitourinary: Negative.   Musculoskeletal: Negative.  Skin: Negative.   Neurological: Negative.   Psychiatric/Behavioral: Positive for suicidal ideas.    Blood pressure 97/68, pulse 90, temperature (!) 97.5 F (36.4 C), temperature source Oral, resp. rate 20, height 6\' 4"  (1.93 m), weight 87.1 kg, SpO2 98 %.Body mass index is 23.37 kg/m.  General Appearance: Casual  Eye Contact:  Good  Speech:  NA  Volume:  na  Mood:  Depressed  Affect:  Depressed  Thought Process:  Coherent, Goal Directed and Descriptions of Associations: Intact  Orientation:  Full (Time, Place, and Person)  Thought Content:  Logical  Suicidal Thoughts:  Yes.  without intent/plan  Homicidal Thoughts:  No  Memory:  Immediate;   Good Recent;   Good Remote;   Good  Judgement:  Fair  Insight:  Fair  Psychomotor Activity:  Normal  Concentration:  Concentration: Good and Attention Span: Good  Recall:  Good  Fund of Knowledge:  Good  Language:  Good  Akathisia:  No  Handed:  Right  AIMS (if indicated):     Assets:  Communication Skills Desire for Improvement Financial Resources/Insurance Housing Intimacy Leisure Time Physical Health Resilience Social Support Talents/Skills Transportation  ADL's:  Intact  Cognition:  WNL  Sleep:         Treatment Plan Summary: Patient reviewed with Dr. Bronwen BettersLaubach. QTC 450, 10/15/2020   Disposition: Recommend psychiatric Inpatient admission when medically cleared. Supportive therapy provided about ongoing stressors.  This service was provided via telemedicine using a 2-way, interactive audio and video technology.  Names of all persons participating in this telemedicine service and their role in this encounter. Name: Annice PihSteven Catano Role: Patient  Name: Berneice Heinrichina Tate Role: FNP  Name: Dr. Bronwen BettersLaubach Role: Psychiatrist    Patrcia Dollyina L Tate, FNP 10/16/2020 12:57 PM

## 2020-10-16 NOTE — ED Notes (Signed)
Pt pacing in room. Agitated. Anxious. Stated feels like ants crawling on arms.  PRN risperidone given ( see flowsheet and MAR)

## 2020-10-16 NOTE — ED Provider Notes (Signed)
  Physical Exam  BP 97/68 (BP Location: Left Arm)   Pulse 90   Temp (!) 97.5 F (36.4 C) (Oral)   Resp 20   Ht 6\' 4"  (1.93 m)   Wt 87.1 kg   SpO2 98%   BMI 23.37 kg/m   Physical Exam  ED Course/Procedures   Clinical Course as of 10/16/20 1359  Wed Oct 15, 2020  1035 Patient resting comfortably in bed. [SJ]    Clinical Course User Index [SJ] Joy, Shawn C, PA-C    Procedures  MDM  Received care of pt at 7AM. Currently awaiting psychiatric placement.  Had described urinary retentive symptoms which he had evaluated when he initially presented to ED 2/14--had bladder scan with minimal urine followed by catheterization which confirmed only 30cc left in bladder as well as CT abdomen pelvis which did not show acute abnormalities. UA was not consistent with UTI.  Given work up do not see emergent etiology of urinary symptoms or suspect true urinary retention.  Patient sleeping at time of my rounding without acute concerns from nursing.  He later became agitated and required geodon.       03-23-1975, MD 10/17/20 [id number]

## 2020-10-17 ENCOUNTER — Encounter (HOSPITAL_COMMUNITY): Payer: Self-pay

## 2020-10-17 ENCOUNTER — Other Ambulatory Visit: Payer: Self-pay

## 2020-10-17 MED ORDER — HYDROXYZINE HCL 50 MG PO TABS: 50.0000 mg | ORAL_TABLET | ORAL | Status: DC | PRN

## 2020-10-17 MED ORDER — ESCITALOPRAM OXALATE 10 MG PO TABS
10.0000 mg | ORAL_TABLET | Freq: Every day | ORAL | Status: DC
  Administered 2020-10-18 – 2020-10-20 (×3): 10 mg via ORAL
  Filled 2020-10-17 (×4): qty 1

## 2020-10-17 MED ORDER — CLONAZEPAM 0.5 MG PO TABS
0.5000 mg | ORAL_TABLET | Freq: Three times a day (TID) | ORAL | Status: DC | PRN
  Administered 2020-10-17 – 2020-10-19 (×5): 0.5 mg via ORAL
  Filled 2020-10-17 (×5): qty 1

## 2020-10-17 MED ORDER — TAMSULOSIN HCL 0.4 MG PO CAPS
0.4000 mg | ORAL_CAPSULE | Freq: Every day | ORAL | Status: DC
  Administered 2020-10-17 – 2020-10-21 (×5): 0.4 mg via ORAL
  Filled 2020-10-17 (×6): qty 1

## 2020-10-17 MED ORDER — QUETIAPINE FUMARATE 100 MG PO TABS
100.0000 mg | ORAL_TABLET | Freq: Every day | ORAL | Status: DC
  Administered 2020-10-17 – 2020-10-22 (×6): 100 mg via ORAL
  Filled 2020-10-17 (×7): qty 1

## 2020-10-17 MED ORDER — ENSURE ENLIVE PO LIQD
237.0000 mL | ORAL | Status: DC
  Administered 2020-10-17 – 2020-10-22 (×6): 237 mL via ORAL
  Filled 2020-10-17 (×7): qty 237

## 2020-10-17 MED ORDER — CLONAZEPAM 1 MG PO TABS
1.0000 mg | ORAL_TABLET | Freq: Once | ORAL | Status: AC
  Administered 2020-10-17: 1 mg via ORAL
  Filled 2020-10-17: qty 1

## 2020-10-17 NOTE — Tx Team (Signed)
Initial Treatment Plan 10/17/2020 1:58 AM [name] OHF:[id number]    PATIENT STRESSORS: Financial difficulties Health problems Medication change or noncompliance Traumatic event   PATIENT STRENGTHS: Capable of independent living General fund of knowledge Motivation for treatment/growth Supportive family/friends   PATIENT IDENTIFIED PROBLEMS: Anxiety   Depression  "Medication adjustment"  "Socialise more"               DISCHARGE CRITERIA:  Ability to meet basic life and health needs Improved stabilization in mood, thinking, and/or behavior Medical problems require only outpatient monitoring Motivation to continue treatment in a less acute level of care  PRELIMINARY DISCHARGE PLAN: Attend aftercare/continuing care group Attend PHP/IOP Outpatient therapy Return to previous living arrangement  PATIENT/FAMILY INVOLVEMENT: This treatment plan has been presented to and reviewed with the patient, Tyler Horn, and/or family member.  The patient and family have been given the opportunity to ask questions and make suggestions.  Bethann Punches, RN 10/17/2020, 1:58 AM

## 2020-10-17 NOTE — Progress Notes (Signed)
   10/17/20 2045  Psych Admission Type (Psych Patients Only)  Admission Status Voluntary  Psychosocial Assessment  Patient Complaints Anxiety;Depression  Eye Contact Fair  Facial Expression Animated;Anxious;Worried  Affect Anxious;Appropriate to circumstance  Speech Other (Comment) (deaf)  Interaction Assertive  Motor Activity Fidgety;Pacing;Restless  Appearance/Hygiene Unremarkable  Behavior Characteristics Cooperative;Appropriate to situation;Anxious  Mood Anxious;Pleasant  Thought Process  Coherency WDL  Content WDL  Delusions None reported or observed  Perception WDL  Judgment Poor  Confusion None  Danger to Self  Current suicidal ideation? Denies  Danger to Others  Danger to Others None reported or observed   Pt denies SI, HI, AVH. Endorses pain 6/10 in lower back most likely d/t bouts of urinary retention. Pt states started on Flomax today and his back doesn't feel all that pressure. Pt rates anxiety 5/10 and depression 9/10 d/t his urinary issues.

## 2020-10-17 NOTE — Plan of Care (Signed)
  Problem: Coping: Goal: Ability to identify and develop effective coping behavior will improve Outcome: Progressing   Problem: Education: Goal: Utilization of techniques to improve thought processes will improve Outcome: Progressing Goal: Knowledge of the prescribed therapeutic regimen will improve Outcome: Progressing
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
## 2020-10-17 NOTE — Progress Notes (Signed)
Patient's cane located in TCU at [facility].  Security will transport to [facility].  Chyrl [place], [facility] notified.
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
## 2020-10-17 NOTE — Progress Notes (Signed)
Tyler Horn is a 60 y.o. male Voluntary admitted for suicide ideation with a plan to cut himself with a kitchen knife. Pt stated he has been suicidal for some time now since he run out of psych mediations due to change in his insurance. Pt stated has been having difficulty urinating due urinary retention; because of this he has been having panic attacks like, been hearing voices and lack of sleep. Pt stated he has not been eating well due to poor appetite and has lost over 40 lbs. Pt denies use of drugs or alcohol, pt is HIV positive and on medications for that. Admission completed by the use of interpretor via ipad as pt is deaf. Pt is currently reporting passive SI but denies AVH and contracted for safety. Consents signed, skin/belongings search completed and pt oriented to unit. Pt stable at this time. Pt given the opportunity to express concerns and ask questions. Pt given toiletries. Will continue to monitor.

## 2020-10-17 NOTE — Tx Team (Signed)
Interdisciplinary Treatment and Diagnostic Plan Update  10/17/2020 Time of Session: 9:35am  Tyler Horn MRN: [id number]  Principal Diagnosis: <principal problem not specified>  Secondary Diagnoses: Active Problems:   MDD (major depressive disorder), recurrent episode, severe (HCC)   Current Medications:  Current Facility-Administered Medications  Medication Dose Route Frequency Provider Last Rate Last Admin  . acetaminophen (TYLENOL) tablet 650 mg  650 mg Oral Q6H PRN Tate, Tina L, FNP   650 mg at 10/16/20 2232  . alum & mag hydroxide-simeth (MAALOX/MYLANTA) 200-200-20 MG/5ML suspension 30 mL  30 mL Oral Q4H PRN Tate, Tina L, FNP      . bictegravir-emtricitabine-tenofovir AF (BIKTARVY) 50-200-25 MG per tablet 1 tablet  1 tablet Oral Daily Tate, Tina L, FNP   1 tablet at 10/17/20 0747  . clonazePAM (KLONOPIN) tablet 0.5 mg  0.5 mg Oral TID PRN Nwoko, Agnes I, NP      . clonazePAM (KLONOPIN) tablet 1 mg  1 mg Oral Once Nwoko, Agnes I, NP      . [START ON 10/18/2020] escitalopram (LEXAPRO) tablet 10 mg  10 mg Oral Daily Nwoko, Agnes I, NP      . hydrOXYzine (ATARAX/VISTARIL) tablet 25 mg  25 mg Oral TID PRN Tate, Tina L, FNP   25 mg at 10/17/20 0907  . magnesium hydroxide (MILK OF MAGNESIA) suspension 30 mL  30 mL Oral Daily PRN Tate, Tina L, FNP      . melatonin tablet 5 mg  5 mg Oral QHS Tate, Tina L, FNP      . QUEtiapine (SEROQUEL) tablet 100 mg  100 mg Oral QHS Nwoko, Agnes I, NP      . tamsulosin (FLOMAX) capsule 0.4 mg  0.4 mg Oral Daily Nwoko, Agnes I, NP      . Vitamin D3 (Vitamin D) tablet 1,000 Units  1,000 Units Oral Daily Tate, Tina L, FNP   1,000 Units at 10/17/20 0747   PTA Medications: Medications Prior to Admission  Medication Sig Dispense Refill Last Dose  . bictegravir-emtricitabine-tenofovir AF (BIKTARVY) 50-200-25 MG TABS tablet Take 1 tablet by mouth daily. 30 tablet 2   . cholecalciferol (VITAMIN D3) 25 MCG (1000 UNIT) tablet Take 1,000 Units by mouth daily.     .  escitalopram (LEXAPRO) 20 MG tablet Take 20 mg by mouth daily.     . hydrOXYzine (ATARAX/VISTARIL) 25 MG tablet Take 1 tablet (25 mg total) by mouth 3 (three) times daily as needed for anxiety. 30 tablet 1   . ibuprofen (ADVIL) 100 MG tablet Take 100 mg by mouth every 6 (six) hours as needed for fever.     . Lactobacillus (PROBIOTIC ACIDOPHILUS PO) Take 1 tablet by mouth daily.     . LORazepam (ATIVAN) 0.5 MG tablet Take 1 tablet (0.5 mg total) by mouth 3 (three) times daily as needed for up to 3 doses for anxiety. 3 tablet 0   . Omega-3 Fatty Acids (FISH OIL) 1000 MG CAPS Take 1,000 mg by mouth daily.     . prazosin (MINIPRESS) 1 MG capsule Take 1 capsule (1 mg total) by mouth at bedtime. (Patient not taking: No sig reported) 30 capsule 1   . psyllium (METAMUCIL) 58.6 % packet Take 1 packet by mouth daily.     . QUEtiapine (SEROQUEL) 200 MG tablet TAKE 1 TABLET BY MOUTH AT BEDTIME (Patient taking differently: Take 200 mg by mouth at bedtime.) 30 tablet 1     Patient Stressors: Financial difficulties Health problems Medication change or noncompliance   Traumatic event  Patient Strengths: Capable of independent living General fund of knowledge Motivation for treatment/growth Supportive family/friends  Treatment Modalities: Medication Management, Group therapy, Case management,  1 to 1 session with clinician, Psychoeducation, Recreational therapy.   Physician Treatment Plan for Primary Diagnosis: <principal problem not specified> Long Term Goal(s): Improvement in symptoms so as ready for discharge Improvement in symptoms so as ready for discharge   Short Term Goals: Ability to identify changes in lifestyle to reduce recurrence of condition will improve Ability to verbalize feelings will improve Ability to disclose and discuss suicidal ideas Ability to demonstrate self-control will improve Ability to identify and develop effective coping behaviors will improve Ability to maintain  clinical measurements within normal limits will improve Compliance with prescribed medications will improve  Medication Management: Evaluate patient's response, side effects, and tolerance of medication regimen.  Therapeutic Interventions: 1 to 1 sessions, Unit Group sessions and Medication administration.  Evaluation of Outcomes: Not Met  Physician Treatment Plan for Secondary Diagnosis: Active Problems:   MDD (major depressive disorder), recurrent episode, severe ([place])  Long Term Goal(s): Improvement in symptoms so as ready for discharge Improvement in symptoms so as ready for discharge   Short Term Goals: Ability to identify changes in lifestyle to reduce recurrence of condition will improve Ability to verbalize feelings will improve Ability to disclose and discuss suicidal ideas Ability to demonstrate self-control will improve Ability to identify and develop effective coping behaviors will improve Ability to maintain clinical measurements within normal limits will improve Compliance with prescribed medications will improve     Medication Management: Evaluate patient's response, side effects, and tolerance of medication regimen.  Therapeutic Interventions: 1 to 1 sessions, Unit Group sessions and Medication administration.  Evaluation of Outcomes: Not Met   RN Treatment Plan for Primary Diagnosis: <principal problem not specified> Long Term Goal(s): Knowledge of disease and therapeutic regimen to maintain health will improve  Short Term Goals: Ability to remain free from injury will improve, Ability to participate in decision making will improve, Ability to verbalize feelings will improve, Ability to disclose and discuss suicidal ideas and Ability to identify and develop effective coping behaviors will improve  Medication Management: RN will administer medications as ordered by provider, will assess and evaluate patient's response and provide education to patient for prescribed  medication. RN will report any adverse and/or side effects to prescribing provider.  Therapeutic Interventions: 1 on 1 counseling sessions, Psychoeducation, Medication administration, Evaluate responses to treatment, Monitor vital signs and CBGs as ordered, Perform/monitor CIWA, COWS, AIMS and Fall Risk screenings as ordered, Perform wound care treatments as ordered.  Evaluation of Outcomes: Not Met   LCSW Treatment Plan for Primary Diagnosis: <principal problem not specified> Long Term Goal(s): Safe transition to appropriate next level of care at discharge, Engage patient in therapeutic group addressing interpersonal concerns.  Short Term Goals: Engage patient in aftercare planning with referrals and resources, Increase social support, Increase emotional regulation, Facilitate acceptance of mental health diagnosis and concerns, Identify triggers associated with mental health/substance abuse issues and Increase skills for wellness and recovery  Therapeutic Interventions: Assess for all discharge needs, 1 to 1 time with Social worker, Explore available resources and support systems, Assess for adequacy in community support network, Educate family and significant other(s) on suicide prevention, Complete Psychosocial Assessment, Interpersonal group therapy.  Evaluation of Outcomes: Not Met   Progress in Treatment: Attending groups: Yes. Participating in groups: Yes. Taking medication as prescribed: No. Toleration medication: No. Family/Significant other contact made: Yes,  individual(s) contacted:  Mother Patient understands diagnosis: Yes. Discussing patient identified problems/goals with staff: Yes. Medical problems stabilized or resolved: Yes. Denies suicidal/homicidal ideation: Yes. Issues/concerns per patient self-inventory: No.   New problem(s) identified: No, Describe:  None   New Short Term/Long Term Goal(s): medication stabilization, elimination of SI thoughts, development of  comprehensive mental wellness plan.   Patient Goals:  "To get mentally, physically, and emotionally stable"   Discharge Plan or Barriers: Patient recently admitted. CSW will continue to follow and assess for appropriate referrals and possible discharge planning.   Reason for Continuation of Hospitalization: Anxiety Depression Hallucinations Medication stabilization Suicidal ideation  Estimated Length of Stay: 3 to 5 day   Attendees: Patient: Tyler Horn  10/17/2020   Physician: Lala Lund, MD 10/17/2020   Nursing:  10/17/2020   RN Care Manager: 10/17/2020   Social Worker: Verdis Frederickson, [place]  10/17/2020   Recreational Therapist:  10/17/2020   Other:  10/17/2020   Other:  10/17/2020   Other: 10/17/2020     Scribe for Treatment Team: Darleen Crocker, [place] 10/17/2020 11:23 AM

## 2020-10-17 NOTE — Progress Notes (Signed)
Recreation Therapy Notes  Date:  2.18.22 Time: 1005 Location: [address] Day Room  Group Topic: Stress Management  Goal Area(s) Addresses:  Patient will identify positive stress management techniques. Patient will identify benefits of using stress management post d/c.  Behavioral Response: None  Intervention: Stress Management  Activity: Meditation.  LRT played a meditation focused on loving-kindness of self and others.  Patients were to listen and learn to speak positive affirmations over self and others.  Education: Stress Management, Discharge Planning.   Education Outcome: Acknowledges Education  Clinical Observations/Feedback: With the help of the social worker Sharyl Nimrod), the interpreter was contacted and informed about group and told that it would be a meditation group.  Interpreter spoke with patient and they determined it would be hard to patient to focus during the session.  As a result, pt did not attend group.    Caroll Rancher, LRT/CTRS    Caroll Rancher A 10/17/2020 11:42 AM

## 2020-10-17 NOTE — Progress Notes (Signed)
NUTRITION ASSESSMENT RD working remotely.  Pt identified as at risk on the Malnutrition Screen Tool  INTERVENTION: - will order Ensure Enlive once/day, each supplement provides 350 kcal and 20 grams of protein.  NUTRITION DIAGNOSIS: Unintentional weight loss related to sub-optimal intake as evidenced by pt report.   Goal: Pt to meet >/= 90% of their estimated nutrition needs.  Monitor:  PO intake  Assessment:  Patient admitted with severe, recurrent MDD with psychosis.   Patient is deaf and communicates with ASL. RD covering remotely.   He [facility] staff that he has been experiencing severe depression and anxiety with SI after running out of his psych meds 4 months ago.   Weight today is 198 lb, weight on 07/08/20 was 201 lb, and weight on 02/19/20 was 219 lb. This indicates 21 lb weight loss (9.6% body weight) in the past 8 months; not significant for time frame.  60 y.o. male  Height: Ht Readings from Last 1 Encounters:  10/17/20 6\' 4"  (1.93 m)    Weight: Wt Readings from Last 1 Encounters:  10/17/20 89.8 kg    Weight Hx: Wt Readings from Last 10 Encounters:  10/17/20 89.8 kg  10/15/20 87.1 kg  08/07/20 87.1 kg  07/08/20 91.6 kg  02/19/20 99.8 kg  12/19/19 102.5 kg  11/28/19 98.4 kg  10/26/19 95.3 kg  07/13/17 95.3 kg    BMI:  Body mass index is 24.1 kg/m. Pt meets criteria for normal weight based on current BMI.  Estimated Nutritional Needs: Kcal: 25-30 kcal/kg Protein: > 1 gram protein/kg Fluid: 1 ml/kcal  Diet Order:  Diet Order            Diet regular Room service appropriate? Yes; Fluid consistency: Thin  Diet effective now                Pt is also offered choice of unit snacks mid-morning and mid-afternoon.  Pt is eating as desired.   Lab results and medications reviewed.      07/15/17, MS, RD, LDN, CNSC Inpatient Clinical Dietitian RD pager # available in [facility]  After hours/weekend pager # available in [facility]
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
## 2020-10-18 LAB — URINALYSIS, COMPLETE (UACMP) WITH MICROSCOPIC
Bilirubin Urine: NEGATIVE
Glucose, UA: NEGATIVE mg/dL
Hgb urine dipstick: NEGATIVE
Ketones, ur: NEGATIVE mg/dL
Nitrite: NEGATIVE
Protein, ur: 30 mg/dL — AB
Specific Gravity, Urine: 1.029 (ref 1.005–1.030)
WBC, UA: >50 WBC/hpf — ABNORMAL HIGH (ref 0–5)
pH: 5 (ref 5.0–8.0)

## 2020-10-18 LAB — LIPID PANEL
Cholesterol: 142 mg/dL (ref 0–200)
HDL: 39 mg/dL — ABNORMAL LOW (ref >40)
LDL Cholesterol: 93 mg/dL (ref 0–99)
Total CHOL/HDL Ratio: 3.6 RATIO
Triglycerides: 52 mg/dL (ref <150)
VLDL: 10 mg/dL (ref 0–40)

## 2020-10-18 LAB — HEMOGLOBIN A1C
Hgb A1c MFr Bld: 5.1 % (ref 4.8–5.6)
Mean Plasma Glucose: 99.67 mg/dL

## 2020-10-18 LAB — TSH: TSH: 2.025 u[IU]/mL (ref 0.350–4.500)

## 2020-10-18 NOTE — Progress Notes (Addendum)
D. Pt has been calm, cooperative on the unit, and has been visible in the dayroom interacting well with peers and staff. Per pt's self inventory, pt rated his depression, hopelessness and anxiety all 8's this am. Pt reports that his goal today was "to continue working on stabilization" and that he will "participate in groups and play games" to help him to meet that goal.Pt currently denies SI/HI and AVH A. Labs and vitals monitored. Pt compliant with medications. Pt supported emotionally and encouraged to express concerns and ask questions.   R. Pt remains safe with 15 minute checks. Will continue POC.

## 2020-10-18 NOTE — Progress Notes (Signed)
[facility]  10/18/2020 1:07 PM Tyler Horn  MRN:  [id number]   In brief: Tyler Horn 60 year old male with a history of Major depressive Disorder. [facility] of worsening depression which triggered severe anxiety and suicidal ideation with a plan to cut himself with a kitchen knife. This represents one of several [place] system psychiatric admissions, the most recent being March 2021.   Subjective:  "I feel better today, less depressed"  Patient was seen face to face on the unit, chart reviewed and case discussed with the treatment team. Patient is hearing impaired and uses a sign language interpreter. Today the sign language interpreter service did not answer and the assessment was carried out using pen and paper. Patient stated he is feeling less depressed and anxious since being admitted. He does not appear restless or fidgety today since his medications were adjusted. He denies suicidal ideation, plan or intent. He denies auditory and visual hallucinations. He is taking his medications without complaint of side effects. He denies urinary pain and flank pain today. Patient was started on Flomax 2/18 for urinary retention and hesitancy. He stated his urinary flow is improving since starting the Flomax 2 days ago. He stated he is sleeping well and his appetite is fair. He is attending group therapy, with the use of the ASL interpreter service,  and interacting with peers and staff appropriately. Encouragement and support offered.     Of note: [facility] with urinary complaints. His CT showed bilateral nephrolithiasis and mild inflammation of the bladder, suspect that he passed a stone. (see MD's note dated 10/13/2020).  A foley was placed and later removed, which may explain the hematuria on his urinalysis. A repeat UA [facility] not obtained [facility], will repeat today.    Principal Problem: MDD (major depressive disorder),  recurrent, severe, with psychosis (HCC) Diagnosis: Principal Problem:   MDD (major depressive disorder), recurrent, severe, with psychosis (HCC) Active Problems:   MDD (major depressive disorder), recurrent episode, severe (HCC)  Total Time spent with patient: 30 minutes  Past Psychiatric History: Major depressive disorder with psychosis, PTSD, Anxiety  Past Medical History:  Past Medical History:  Diagnosis Date  . Anxiety   . Depression   . HIV (human immunodeficiency virus infection) (HCC)   . HIV (human immunodeficiency virus infection) (HCC)     Past Surgical History:  Procedure Laterality Date  . CATARACT EXTRACTION Left    Family History:  Family History  Problem Relation Age of Onset  . Mental illness Mother   . Bipolar disorder Brother    Family Psychiatric  History: Denies knowing of any family history Social History:  Social History   Substance and Sexual Activity  Alcohol Use No     Social History   Substance and Sexual Activity  Drug Use No    Social History   Socioeconomic History  . Marital status: Single    Spouse name: Not on file  . Number of children: Not on file  . Years of education: Not on file  . Highest education level: Not on file  Occupational History  . Not on file  Tobacco Use  . Smoking status: Never Smoker  . Smokeless tobacco: Never Used  Substance and Sexual Activity  . Alcohol use: No  . Drug use: No  . Sexual activity: Yes  Other Topics Concern  . Not on file  Social History Narrative  .  Not on file   Social Determinants of Health   Financial Resource Strain: Not on file  Food Insecurity: Not on file  Transportation Needs: Not on file  Physical Activity: Not on file  Stress: Not on file  Social Connections: Not on file   Additional Social History:    Pain Medications: See MAR Prescriptions: See MAR Over the Counter: See MAR History of alcohol / drug use?: No history of alcohol / drug abuse     Sleep:  Good  Appetite:  Good  Current Medications: Current Facility-Administered Medications  Medication Dose Route Frequency Provider Last Rate Last Admin  . acetaminophen (TYLENOL) tablet 650 mg  650 mg Oral Q6H PRN Patrcia Dolly, FNP   650 mg at 10/18/20 0809  . alum & mag hydroxide-simeth (MAALOX/MYLANTA) 200-200-20 MG/5ML suspension 30 mL  30 mL Oral Q4H PRN Patrcia Dolly, FNP      . bictegravir-emtricitabine-tenofovir AF (BIKTARVY) 50-200-25 MG per tablet 1 tablet  1 tablet Oral Daily Patrcia Dolly, FNP   1 tablet at 10/18/20 0807  . clonazePAM (KLONOPIN) tablet 0.5 mg  0.5 mg Oral TID PRN Armandina Stammer I, NP   0.5 mg at 10/18/20 0837  . escitalopram (LEXAPRO) tablet 10 mg  10 mg Oral Daily Armandina Stammer I, NP   10 mg at 10/18/20 0807  . feeding supplement (ENSURE ENLIVE / ENSURE PLUS) liquid 237 mL  237 mL Oral Q24H Antonieta Pert, MD   237 mL at 10/17/20 1537  . hydrOXYzine (ATARAX/VISTARIL) tablet 50 mg  50 mg Oral Q4H PRN Armandina Stammer I, NP      . magnesium hydroxide (MILK OF MAGNESIA) suspension 30 mL  30 mL Oral Daily PRN Patrcia Dolly, FNP      . melatonin tablet 5 mg  5 mg Oral QHS Patrcia Dolly, FNP   5 mg at 10/17/20 2105  . QUEtiapine (SEROQUEL) tablet 100 mg  100 mg Oral QHS Armandina Stammer I, NP   100 mg at 10/17/20 2105  . tamsulosin (FLOMAX) capsule 0.4 mg  0.4 mg Oral Daily Armandina Stammer I, NP   0.4 mg at 10/18/20 9622  . Vitamin D3 (Vitamin D) tablet 1,000 Units  1,000 Units Oral Daily Patrcia Dolly, FNP   1,000 Units at 10/18/20 2979    Lab Results:  Results for orders placed or performed during the hospital encounter of 10/16/20 (from the past 48 hour(s))  TSH     Status: None   Collection Time: 10/18/20  7:05 AM  Result Value Ref Range   TSH 2.025 0.350 - 4.500 uIU/mL    Comment: Performed by a 3rd Generation assay with a functional sensitivity of <=0.01 uIU/mL. Performed at [facility], [address] [place], [place] [address]   Hemoglobin A1c      Status: None   Collection Time: 10/18/20  7:05 AM  Result Value Ref Range   Hgb A1c MFr Bld 5.1 4.8 - 5.6 %    Comment: (NOTE) Pre diabetes:          5.7%-6.4%  Diabetes:              >6.4%  Glycemic control for   <7.0% adults with diabetes    Mean Plasma Glucose 99.67 mg/dL    Comment: Performed at [facility], [address] [place], [place] [address]  Lipid panel     Status: Abnormal   Collection Time: 10/18/20  7:05 AM  Result  Value Ref Range   Cholesterol 142 0 - 200 mg/dL   Triglycerides 52 <629 mg/dL   HDL 39 (L) >52 mg/dL   Total CHOL/HDL Ratio 3.6 RATIO   VLDL 10 0 - 40 mg/dL   LDL Cholesterol 93 0 - 99 mg/dL    Comment:        Total Cholesterol/HDL:CHD Risk Coronary Heart Disease Risk Table                     Men   Women  1/2 Average Risk   3.4   3.3  Average Risk       5.0   4.4  2 X Average Risk   9.6   7.1  3 X Average Risk  23.4   11.0        Use the calculated Patient Ratio above and the CHD Risk Table to determine the patient's CHD Risk.        ATP III CLASSIFICATION (LDL):  <100     mg/dL   Optimal  841-324  mg/dL   Near or Above                    Optimal  130-159  mg/dL   Borderline  401-027  mg/dL   High  >253     mg/dL   Very High Performed at [facility], [address], [place], [place] [address]     Blood Alcohol level:  Lab Results  Component Value Date   ETH <10 11/26/2019   ETH <10 10/26/2019    Metabolic Disorder Labs: Lab Results  Component Value Date   HGBA1C 5.1 10/18/2020   MPG 99.67 10/18/2020   MPG 99.67 07/15/2017   No results found for: PROLACTIN Lab Results  Component Value Date   CHOL 142 10/18/2020   TRIG 52 10/18/2020   HDL 39 (L) 10/18/2020   CHOLHDL 3.6 10/18/2020   VLDL 10 10/18/2020   LDLCALC 93 10/18/2020   LDLCALC 123 (H) 11/28/2019    Physical Findings: AIMS: Facial and Oral Movements Muscles of Facial Expression: None, normal Lips and Perioral Area: None,  normal Jaw: None, normal Tongue: None, normal,Extremity Movements Upper (arms, wrists, hands, fingers): None, normal Lower (legs, knees, ankles, toes): None, normal, Trunk Movements Neck, shoulders, hips: None, normal, Overall Severity Severity of abnormal movements (highest score from questions above): None, normal Incapacitation due to abnormal movements: None, normal Patient's awareness of abnormal movements (rate only patient's report): No Awareness, Dental Status Current problems with teeth and/or dentures?: No Does patient usually wear dentures?: No  CIWA:  CIWA-Ar Total: 3 COWS:  COWS Total Score: 3  Musculoskeletal: Strength & Muscle Tone: within normal limits Gait & Station: normal Patient leans: N/A  Psychiatric Specialty Exam: Physical Exam Constitutional:      Appearance: Normal appearance.  HENT:     Head: Normocephalic.  Musculoskeletal:        General: Normal range of motion.     Cervical back: Normal range of motion.  Neurological:     Mental Status: He is alert and oriented to person, place, and time.  Psychiatric:        Attention and Perception: Attention normal.        Mood and Affect: Mood is anxious and depressed.        Behavior: Behavior is cooperative.     Comments: Poor language skills, patient is deaf      Review of Systems  Constitutional: Negative.  HENT: Negative for rhinorrhea and sneezing.        Patient is hearing impaired   Gastrointestinal: Negative.   Genitourinary: Positive for difficulty urinating and urgency. Negative for dysuria and flank pain.       Patient taking Flomax    Neurological: Negative.   Psychiatric/Behavioral: The patient is nervous/anxious.        Depression     Blood pressure 114/63, pulse 100, temperature 97.8 F (36.6 C), temperature source Oral, resp. rate 16, height 6\' 4"  (1.93 m), weight 89.8 kg, SpO2 97 %.Body mass index is 24.1 kg/m.  General Appearance: Casual, adequate hygiene  Eye Contact:  Good   Speech:  Garbled, patient is deaf  Volume:  Decreased, patient is deaf  Mood:  Depressed  Affect:  Congruent and Depressed  Thought Process:  Coherent, Goal Directed and Descriptions of Associations: Intact  Orientation:  Full (Time, Place, and Person)  Thought Content:  Hallucinations: Auditory  Suicidal Thoughts:  No  Homicidal Thoughts:  No  Memory:  Immediate;   Good Recent;   Good Remote;   Fair  Judgement:  Fair  Insight:  Present  Psychomotor Activity:  Normal  Concentration:  Concentration: Fair and Attention Span: Fair  Recall:  of Knowledge:  Good  Language:  Patient is deaf, poor language skills  Akathisia:  No  Handed:  Right  AIMS (if indicated):     Assets:  Desire for Improvement Housing Resilience Social Support  ADL's:  Intact  Cognition:  WNL  Sleep:  Number of Hours: 6.5     Treatment Plan Summary:  Daily contact with patient to assess and evaluate symptoms and progress in treatment and Medication management   1. Admit for crisis management and stabilization, estimated length of stay 3-5 days.   2. Medication management to reduce current symptoms to base line and improve the patient's overall level of functioning:See MAR, Md's SRA &treatment plan.  Depression & Anxiety:                    Clonazepam 0.5 mg po tid prn for severe anxiety x 5 days, then stop.                 Continue Lexapro 10 mg po daily for depression.                 Continue Seroquel  100 mg po Q hs for mood control.                 Continue Vistaril 50 mg po tid prn for anxiety.                 Continue trial of gabapentin 100 mg po tid for agitation.  Urinary Retention & Frequency:                    Continue Flomax 0.4 mg daily   HIV:                     Continue Biktarvy 50-200-25 mg daily ( patient'shome                     medication)     [employer], NP 10/18/2020, 1:07 PM

## 2020-10-18 NOTE — Progress Notes (Signed)
[facility] Group Notes:  (Nursing/MHT/Case Management/Adjunct)  Date:  10/18/2020  Time:  2015  Type of Therapy:  wrap up group  Participation Level:  Active  Participation Quality:  Appropriate, Attentive, Sharing and Supportive  Affect:  Appropriate  Cognitive:  Disorganized  Insight:  Improving  Engagement in Group:  Engaged  Modes of Intervention:  Clarification, Education and Support  Summary of Progress/Problems: Positive thinking and positive change were discussed. Teleinterpreter/Telesigner was used during group time.   Marcille Buffy 10/18/2020, 8:34 PM

## 2020-10-18 NOTE — [facility] Group Notes (Signed)
LCSW Group Therapy Note  10/04/2020   10:00-11:00am   Topic:  Anger Triggers and Coping Skills  Participation Level:  Active with ASL interpreter  Description of Group:   In this group, patients learned how to recognize the physical, cognitive, emotional, and behavioral responses they have to anger-provoking situations.  They identified their own common triggers and typical reactions then analyzed how these reactions are possibly beneficial and possibly unhelpful.  Focus was placed on how helpful it is to recognize the underlying emotions to anger in order to address these for more permanent resolution.  Emphasis was also on identifying possible replacement thoughts for the automatic thoughts generated in various situations shared by the group.  Therapeutic Goals: 1. Patients will share situations that commonly incite their anger and how they typically respond 2. Patients will identify how their coping skills work for them and/or against them 3. Patients will explore possible alternative thoughts to their automatic ones 4. Patients will learn that anger itself is normal and that healthier reactions can assist with resolving conflict rather than worsening situations  Summary of Patient Progress:  The patient shared that his level of anger is usually about 2-3 out of 10 and said he is often angry when someone is not paying attention to what he is saying because he is using sign language.  He also shared with the group that the deaf community is very hard-working and people within that group can be offended when their hearing counterparts stand around talking, because they do not stand around doing such, so it can give them the impression they are working harder than their hearing co-workers.  He shared a great deal throughout group and took command of the tablet in order to connect with an interpreter.  Therapeutic Modalities:   Cognitive Behavioral Therapy Processing  [name]

## 2020-10-18 NOTE — Progress Notes (Signed)
[place] NOVEL CORONAVIRUS (COVID-19) DAILY CHECK-OFF SYMPTOMS - answer yes or no to each - every day NO YES  Have you had a fever in the past 24 hours?  . Fever (Temp > 37.80C / 100F) X   Have you had any of these symptoms in the past 24 hours? . New Cough .  Sore Throat  .  Shortness of Breath .  Difficulty Breathing .  Unexplained Body Aches   X   Have you had any one of these symptoms in the past 24 hours not related to allergies?   . Runny Nose .  Nasal Congestion .  Sneezing   X   If you have had runny nose, nasal congestion, sneezing in the past 24 hours, has it worsened?  X   EXPOSURES - check yes or no X   Have you traveled outside the state in the past 14 days?  X   Have you been in contact with someone with a confirmed diagnosis of COVID-19 or PUI in the past 14 days without wearing appropriate PPE?  X   Have you been living in the same home as a person with confirmed diagnosis of COVID-19 or a PUI (household contact)?    X   Have you been diagnosed with COVID-19?    X              What to do next: Answered NO to all: Answered YES to anything:   Proceed with unit schedule Follow the [facility] Inpatient Flowsheet.   

## 2020-10-19 DIAGNOSIS — F333 Major depressive disorder, recurrent, severe with psychotic symptoms: Secondary | ICD-10-CM | POA: Diagnosis not present

## 2020-10-19 MED ORDER — HYDROXYZINE HCL 50 MG PO TABS
50.0000 mg | ORAL_TABLET | Freq: Four times a day (QID) | ORAL | Status: DC | PRN
  Administered 2020-10-20 – 2020-10-22 (×5): 50 mg via ORAL
  Filled 2020-10-19 (×5): qty 1

## 2020-10-19 MED ORDER — BUSPIRONE HCL 5 MG PO TABS
5.0000 mg | ORAL_TABLET | Freq: Two times a day (BID) | ORAL | Status: DC
  Administered 2020-10-19 – 2020-10-20 (×2): 5 mg via ORAL
  Filled 2020-10-19 (×5): qty 1

## 2020-10-19 MED ORDER — CLONAZEPAM 0.125 MG PO TBDP
0.2500 mg | ORAL_TABLET | Freq: Two times a day (BID) | ORAL | Status: DC | PRN
  Administered 2020-10-19 – 2020-10-21 (×3): 0.25 mg via ORAL
  Filled 2020-10-19 (×3): qty 2

## 2020-10-19 MED ORDER — CEPHALEXIN 500 MG PO CAPS
500.0000 mg | ORAL_CAPSULE | Freq: Three times a day (TID) | ORAL | Status: DC
  Administered 2020-10-19 – 2020-10-23 (×12): 500 mg via ORAL
  Filled 2020-10-19 (×2): qty 1
  Filled 2020-10-19: qty 2
  Filled 2020-10-19 (×2): qty 1
  Filled 2020-10-19: qty 2
  Filled 2020-10-19 (×10): qty 1
  Filled 2020-10-19: qty 2
  Filled 2020-10-19: qty 1

## 2020-10-19 NOTE — [facility] Group Notes (Signed)
[facility] LCSW Group Therapy Note  10/19/2020    Type of Therapy and Topic:  Group Therapy:  Adding Supports Including Yourself  Participation Level:  Did Not Attend    [name]

## 2020-10-19 NOTE — [facility] Group Notes (Signed)
Psychoeducational Group Note  Date: 10-19-20 Time:  1300  Group Topic/Focus:  Making Healthy Choices:   The focus of this group is to help patients identify negative/unhealthy choices they were using prior to admission and identify positive/healthier coping strategies to replace them upon discharge.In this group, patients started asking about the brain and how the brain works with and how the chemicals work for those who use substances, the pros and cons of saboxone.  Participation Level:  Active  Participation Quality:  Appropriate  Affect:  Appropriate  Cognitive:  Oriented  Insight:  Improving  Engagement in Group:  Engaged  Additional Comments: Came in late to the grouop, but when he arrived he parcipitated.  Dione Housekeeper

## 2020-10-19 NOTE — Progress Notes (Signed)
[place] NOVEL CORONAVIRUS (COVID-19) DAILY CHECK-OFF SYMPTOMS - answer yes or no to each - every day NO YES  Have you had a fever in the past 24 hours?  . Fever (Temp > 37.80C / 100F) X   Have you had any of these symptoms in the past 24 hours? . New Cough .  Sore Throat  .  Shortness of Breath .  Difficulty Breathing .  Unexplained Body Aches   X   Have you had any one of these symptoms in the past 24 hours not related to allergies?   . Runny Nose .  Nasal Congestion .  Sneezing   X   If you have had runny nose, nasal congestion, sneezing in the past 24 hours, has it worsened?  X   EXPOSURES - check yes or no X   Have you traveled outside the state in the past 14 days?  X   Have you been in contact with someone with a confirmed diagnosis of COVID-19 or PUI in the past 14 days without wearing appropriate PPE?  X   Have you been living in the same home as a person with confirmed diagnosis of COVID-19 or a PUI (household contact)?    X   Have you been diagnosed with COVID-19?    X              What to do next: Answered NO to all: Answered YES to anything:   Proceed with unit schedule Follow the [facility] Inpatient Flowsheet.   

## 2020-10-19 NOTE — Progress Notes (Addendum)
   10/19/20 1500  Psych Admission Type (Psych Patients Only)  Admission Status Voluntary  Psychosocial Assessment  Patient Complaints Anxiety  Eye Contact Fair  Facial Expression Animated  Affect Appropriate to circumstance  Speech Logical/coherent  Interaction Assertive  Motor Activity Other (Comment) (WDL)  Appearance/Hygiene Unremarkable  Behavior Characteristics Cooperative;Appropriate to situation  Mood Anxious;Pleasant  Aggressive Behavior  Targets Other (Comment) (Pt stated none)  Type of Behavior Verbal  Thought Process  Coherency WDL  Content WDL  Delusions None reported or observed  Perception WDL  Hallucination None reported or observed  Judgment Poor  Confusion None  Danger to Self  Current suicidal ideation? Denies  Self-Injurious Behavior No self-injurious ideation or behavior indicators observed or expressed   Agreement Not to Harm Self Yes  Description of Agreement Verbal  Danger to Others  Danger to Others None reported or observed   D: Pt observed on the unit interacting appropriately with peers and staff, and attending groups with ASL interpreter via iPad. Per pt's self inventory, pt rated his depression, hopelessness and anxiety a 4/5/6, respectively. Pt states that his goal today is "to increase my stabilization", and that he will "participate in groups" to help him do this. Pt currently denies SI/HI and A/VH.  A: Labs and VS monitored. Pt given and educated on medications. Pt supported emotionally and encouraged to express concerns and ask questions.   R. Pt remains safe with 15 minute checks. Will continue POC.

## 2020-10-19 NOTE — Progress Notes (Signed)
   10/19/20 0056  Psych Admission Type (Psych Patients Only)  Admission Status Voluntary  Psychosocial Assessment  Patient Complaints Anxiety  Eye Contact Fair  Facial Expression Animated  Affect Appropriate to circumstance  Speech Logical/coherent  Interaction Assertive  Motor Activity Other (Comment) (WDL)  Appearance/Hygiene Unremarkable  Behavior Characteristics Appropriate to situation  Mood Anxious;Pleasant  Thought Process  Coherency WDL  Content WDL  Delusions None reported or observed  Perception WDL  Hallucination None reported or observed  Judgment Poor  Confusion None  Danger to Self  Current suicidal ideation? Denies  Self-Injurious Behavior No self-injurious ideation or behavior indicators observed or expressed   Agreement Not to Harm Self Yes  Description of Agreement Verbal  Danger to Others  Danger to Others None reported or observed

## 2020-10-19 NOTE — Progress Notes (Signed)
[facility] MD Progress Note  10/19/2020 11:22 AM Jaice Digioia  MRN:  [id number]   In brief: Joshuwa Vecchio 60 year old male with a history of Major depressive Disorder. He presented to [facility] with complaint of worsening depression which triggered severe anxiety and suicidal ideation with a plan to cut himself with a kitchen knife. This represents one of several [facility] psychiatric admissions, the most recent being March 2021.   Subjective:  "I feel better today, less depressed and I am voiding better today but not completely flowing well"  Patient was seen face to face on the unit, chart reviewed and case discussed with the treatment team. Patient is hearing impaired and uses a sign language interpreter. This assessment today was done with the assistance of sign Language interpreter.  Patient Patient reports improvement in his mood, rates depression 5/10 with 10 being severe depression.  He states that as soon as he resumed taking his medications he started feeling much better.  He also has having difficuity voiding but stated that with Flomax he is also seeing some improvement as he is voiding and flow of urine is improving.  He received Klonopin three times yesterday as needed for anxiety.  Provider informed patient the addictive nature of Klonopin and also made him aware that we will not be sending him home on it.  Patient was given alternative offers of Hydroxyzine and Buspirone which he agrees to take in place of Klonopin.  Both medications were prescribed.  He was reassured that we will offer 30 days prescription for all of his medications on discharge until he sees his outpatient provider.  He denies feeling suicidal or having thoughts of wanting to hurt himself.  He also denies AVH and no Paranoia.  We will continue to monitor urine flow and urgency. Principal Problem: MDD (major depressive disorder), recurrent, severe, with psychosis (HCC) Diagnosis: Principal Problem:   MDD (major depressive  disorder), recurrent, severe, with psychosis (HCC) Active Problems:   MDD (major depressive disorder), recurrent episode, severe (HCC)  Total Time spent with patient: 30 minutes  Past Psychiatric History: Major depressive disorder with psychosis, PTSD, Anxiety  Past Medical History:  Past Medical History:  Diagnosis Date  . Anxiety   . Depression   . HIV (human immunodeficiency virus infection) (HCC)   . HIV (human immunodeficiency virus infection) (HCC)     Past Surgical History:  Procedure Laterality Date  . CATARACT EXTRACTION Left    Family History:  Family History  Problem Relation Age of Onset  . Mental illness Mother   . Bipolar disorder Brother    Family Psychiatric  History: Denies knowing of any family history Social History:  Social History   Substance and Sexual Activity  Alcohol Use No     Social History   Substance and Sexual Activity  Drug Use No    Social History   Socioeconomic History  . Marital status: Single    Spouse name: Not on file  . Number of children: Not on file  . Years of education: Not on file  . Highest education level: Not on file  Occupational History  . Not on file  Tobacco Use  . Smoking status: Never Smoker  . Smokeless tobacco: Never Used  Substance and Sexual Activity  . Alcohol use: No  . Drug use: No  . Sexual activity: Yes  Other Topics Concern  . Not on file  Social History Narrative  . Not on file   Social Determinants of  Health   Financial Resource Strain: Not on file  Food Insecurity: Not on file  Transportation Needs: Not on file  Physical Activity: Not on file  Stress: Not on file  Social Connections: Not on file   Additional Social History:    Pain Medications: See MAR Prescriptions: See MAR Over the Counter: See MAR History of alcohol / drug use?: No history of alcohol / drug abuse     Sleep: Good  Appetite:  Good  Current Medications: Current Facility-Administered Medications   Medication Dose Route Frequency Provider Last Rate Last Admin  . acetaminophen (TYLENOL) tablet 650 mg  650 mg Oral Q6H PRN Patrcia Dolly, FNP   650 mg at 10/18/20 2119  . alum & mag hydroxide-simeth (MAALOX/MYLANTA) 200-200-20 MG/5ML suspension 30 mL  30 mL Oral Q4H PRN Patrcia Dolly, FNP      . bictegravir-emtricitabine-tenofovir AF (BIKTARVY) 50-200-25 MG per tablet 1 tablet  1 tablet Oral Daily Patrcia Dolly, FNP   1 tablet at 10/19/20 0753  . busPIRone (BUSPAR) tablet 5 mg  5 mg Oral BID Welford Roche, Atalaya Zappia C, NP      . clonazePAM (KLONOPIN) tablet 0.25 mg  0.25 mg Oral BID PRN Dahlia Byes C, NP      . escitalopram (LEXAPRO) tablet 10 mg  10 mg Oral Daily Armandina Stammer I, NP   10 mg at 10/19/20 0754  . feeding supplement (ENSURE ENLIVE / ENSURE PLUS) liquid 237 mL  237 mL Oral Q24H Antonieta Pert, MD   237 mL at 10/19/20 1048  . hydrOXYzine (ATARAX/VISTARIL) tablet 50 mg  50 mg Oral Q6H PRN Welford Roche, Adrien Shankar C, NP      . magnesium hydroxide (MILK OF MAGNESIA) suspension 30 mL  30 mL Oral Daily PRN Patrcia Dolly, FNP      . melatonin tablet 5 mg  5 mg Oral QHS Patrcia Dolly, FNP   5 mg at 10/18/20 2120  . QUEtiapine (SEROQUEL) tablet 100 mg  100 mg Oral QHS Armandina Stammer I, NP   100 mg at 10/18/20 2120  . tamsulosin (FLOMAX) capsule 0.4 mg  0.4 mg Oral Daily Armandina Stammer I, NP   0.4 mg at 10/19/20 0753  . Vitamin D3 (Vitamin D) tablet 1,000 Units  1,000 Units Oral Daily Patrcia Dolly, FNP   1,000 Units at 10/19/20 1610    Lab Results:  Results for orders placed or performed during the hospital encounter of 10/16/20 (from the past 48 hour(s))  TSH     Status: None   Collection Time: 10/18/20  7:05 AM  Result Value Ref Range   TSH 2.025 0.350 - 4.500 uIU/mL    Comment: Performed by a 3rd Generation assay with a functional sensitivity of <=0.01 uIU/mL. Performed at [facility], [address], [place], [place] [address]   Hemoglobin A1c     Status: None   Collection  Time: 10/18/20  7:05 AM  Result Value Ref Range   Hgb A1c MFr Bld 5.1 4.8 - 5.6 %    Comment: (NOTE) Pre diabetes:          5.7%-6.4%  Diabetes:              >6.4%  Glycemic control for   <7.0% adults with diabetes    Mean Plasma Glucose 99.67 mg/dL    Comment: Performed at [facility], [address], [place], [place] [address]  Lipid panel     Status: Abnormal  Collection Time: 10/18/20  7:05 AM  Result Value Ref Range   Cholesterol 142 0 - 200 mg/dL   Triglycerides 52 <941 mg/dL   HDL 39 (L) >74 mg/dL   Total CHOL/HDL Ratio 3.6 RATIO   VLDL 10 0 - 40 mg/dL   LDL Cholesterol 93 0 - 99 mg/dL    Comment:        Total Cholesterol/HDL:CHD Risk Coronary Heart Disease Risk Table                     Men   Women  1/2 Average Risk   3.4   3.3  Average Risk       5.0   4.4  2 X Average Risk   9.6   7.1  3 X Average Risk  23.4   11.0        Use the calculated Patient Ratio above and the CHD Risk Table to determine the patient's CHD Risk.        ATP III CLASSIFICATION (LDL):  <100     mg/dL   Optimal  081-448  mg/dL   Near or Above                    Optimal  130-159  mg/dL   Borderline  185-631  mg/dL   High  >497     mg/dL   Very High Performed at [facility], [address], [place], [place] [address]   Urinalysis, Complete w Microscopic Urine, Random     Status: Abnormal   Collection Time: 10/18/20  2:57 PM  Result Value Ref Range   Color, Urine AMBER (A) YELLOW    Comment: BIOCHEMICALS MAY BE AFFECTED BY COLOR   APPearance TURBID (A) CLEAR   Specific Gravity, Urine 1.029 1.005 - 1.030   pH 5.0 5.0 - 8.0   Glucose, UA NEGATIVE NEGATIVE mg/dL   Hgb urine dipstick NEGATIVE NEGATIVE   Bilirubin Urine NEGATIVE NEGATIVE   Ketones, ur NEGATIVE NEGATIVE mg/dL   Protein, ur 30 (A) NEGATIVE mg/dL   Nitrite NEGATIVE NEGATIVE   Leukocytes,Ua LARGE (A) NEGATIVE   RBC / HPF 0-5 0 - 5 RBC/hpf   WBC, UA >50 (H) 0 - 5 WBC/hpf   Bacteria, UA RARE (A)  NONE SEEN   WBC Clumps PRESENT    Mucus PRESENT     Comment: Performed at [facility], [address], [place], [place] [address]    Blood Alcohol level:  Lab Results  Component Value Date   ETH <10 11/26/2019   ETH <10 10/26/2019    Metabolic Disorder Labs: Lab Results  Component Value Date   HGBA1C 5.1 10/18/2020   MPG 99.67 10/18/2020   MPG 99.67 07/15/2017   No results found for: PROLACTIN Lab Results  Component Value Date   CHOL 142 10/18/2020   TRIG 52 10/18/2020   HDL 39 (L) 10/18/2020   CHOLHDL 3.6 10/18/2020   VLDL 10 10/18/2020   LDLCALC 93 10/18/2020   LDLCALC 123 (H) 11/28/2019    Physical Findings: AIMS: Facial and Oral Movements Muscles of Facial Expression: None, normal Lips and Perioral Area: None, normal Jaw: None, normal Tongue: None, normal,Extremity Movements Upper (arms, wrists, hands, fingers): None, normal Lower (legs, knees, ankles, toes): None, normal, Trunk Movements Neck, shoulders, hips: None, normal, Overall Severity Severity of abnormal movements (highest score from questions above): None, normal Incapacitation due to abnormal movements: None, normal Patient's awareness of abnormal movements (rate  only patient's report): No Awareness, Dental Status Current problems with teeth and/or dentures?: No Does patient usually wear dentures?: No  CIWA:  CIWA-Ar Total: 3 COWS:  COWS Total Score: 3  Musculoskeletal: Strength & Muscle Tone: within normal limits Gait & Station: normal Patient leans: N/A  Psychiatric Specialty Exam: Physical Exam Constitutional:      Appearance: Normal appearance.  HENT:     Head: Normocephalic.  Musculoskeletal:        General: Normal range of motion.     Cervical back: Normal range of motion.  Neurological:     Mental Status: He is alert and oriented to person, place, and time.  Psychiatric:        Attention and Perception: Attention normal.        Mood and Affect: Mood is anxious  and depressed.        Behavior: Behavior is cooperative.     Comments: Poor language skills, patient is deaf      Review of Systems  Constitutional: Negative.   HENT: Negative for rhinorrhea and sneezing.        Patient is hearing impaired   Gastrointestinal: Negative.   Genitourinary: Positive for urgency. Negative for dysuria and flank pain.       Patient taking Flomax    Neurological: Negative.   Psychiatric/Behavioral: The patient is nervous/anxious.        Depression     Blood pressure 103/60, pulse (!) 104, temperature 97.8 F (36.6 C), temperature source Oral, resp. rate 16, height 6\' 4"  (1.93 m), weight 89.8 kg, SpO2 96 %.Body mass index is 24.1 kg/m.  General Appearance: Casual, adequate hygiene  Eye Contact:  Good  Speech:  Garbled, patient is deaf  Volume:  Uses Sign Languate with SIGN language interpreter., patient is deaf  Mood:  Depressed- Less depressed and anxious since starting back on his medications.  Affect:  Congruent, Depressed and some improvement since starting on his medications.  Thought Process:  Coherent, Goal Directed and Descriptions of Associations: Intact  Orientation:  Full (Time, Place, and Person)  Thought Content:  Logical and Denies AH since getting back on his medications.  Suicidal Thoughts:  No  Homicidal Thoughts:  No  Memory:  Immediate;   Good Recent;   Good Remote;   Fair  Judgement:  Fair  Insight:  Present  Psychomotor Activity:  Normal  Concentration:  Concentration: Fair and Attention Span: Fair  Recall:  of Knowledge:  Good  Language:  Patient is deaf, poor language skills  Akathisia:  No  Handed:  Right  AIMS (if indicated):     Assets:  Desire for Improvement Housing Resilience Social Support  ADL's:  Intact  Cognition:  WNL  Sleep:  Number of Hours: 6.75     Treatment Plan Summary:  Daily contact with patient to assess and evaluate symptoms and progress in treatment and Medication management    1. Admit for crisis management and stabilization, estimated length of stay 3-5 days.   2. Medication management to reduce current symptoms to base line and improve the patient's overall level of functioning:See MAR, Md's SRA &treatment plan.  Depression & Anxiety:                  Change Clonazepam 0.5 mg  To 0.25 mg po bid as needed for anxiety.  Plan to wean off      before Discharge  Continue Lexapro 10 mg po daily for depression.                 Continue Seroquel  100 mg po Q hs for mood control.                 Continue Vistaril 50 mg po q 6 hours prn for anxiety.                 Continue trial of gabapentin 100 mg po tid for agitation     Start Buspirone 5 mg po bid for anxiety..  Urinary Retention & Frequency:                    Continue Flomax 0.4 mg daily   HIV:                     Continue Biktarvy 50-200-25 mg daily ( patient'shome                     medication)     Earney NavyJosephine C Santita Hunsberger, NP -PMHNP-BC 10/19/2020, 11:22 AM

## 2020-10-19 NOTE — Progress Notes (Signed)
   10/19/20 0052  COVID-19 Daily Checkoff  Have you had a fever (temp > 37.80C/100F)  in the past 24 hours?  No  If you have had runny nose, nasal congestion, sneezing in the past 24 hours, has it worsened? No  COVID-19 EXPOSURE  Have you traveled outside the state in the past 14 days? No  Have you been in contact with someone with a confirmed diagnosis of COVID-19 or PUI in the past 14 days without wearing appropriate PPE? No  Have you been living in the same home as a person with confirmed diagnosis of COVID-19 or a PUI (household contact)? No  Have you been diagnosed with COVID-19? No

## 2020-10-19 NOTE — Progress Notes (Signed)
Urinalysis result from yesterday seems positive for UTI-Large Leucocytes, WBC clumps and WBC greater than 50.  Urine  culture is added.  Pending culture result Keflex will be started while we wait for culture result.   Keflex will be discontinued once Culture is negative.

## 2020-10-20 MED ORDER — BUSPIRONE HCL 7.5 MG PO TABS
7.5000 mg | ORAL_TABLET | Freq: Two times a day (BID) | ORAL | Status: DC
  Administered 2020-10-20 – 2020-10-21 (×2): 7.5 mg via ORAL
  Filled 2020-10-20 (×5): qty 1

## 2020-10-20 MED ORDER — ESCITALOPRAM OXALATE 20 MG PO TABS
20.0000 mg | ORAL_TABLET | Freq: Every day | ORAL | Status: DC
  Administered 2020-10-21 – 2020-10-23 (×3): 20 mg via ORAL
  Filled 2020-10-20 (×4): qty 1

## 2020-10-20 NOTE — Progress Notes (Signed)
   10/20/20 0015  Psych Admission Type (Psych Patients Only)  Admission Status Voluntary  Psychosocial Assessment  Patient Complaints Anxiety  Eye Contact Fair  Facial Expression Animated  Affect Appropriate to circumstance  Speech Logical/coherent  Interaction Assertive  Motor Activity Other (Comment) (WDL)  Appearance/Hygiene Unremarkable  Behavior Characteristics Cooperative  Mood Anxious  Aggressive Behavior  Targets Other (Comment) (Pt stated none)  Type of Behavior Verbal  Thought Process  Coherency WDL  Content WDL  Delusions None reported or observed  Perception WDL  Hallucination None reported or observed  Judgment Impaired  Confusion None  Danger to Self  Current suicidal ideation? Denies  Self-Injurious Behavior No self-injurious ideation or behavior indicators observed or expressed   Agreement Not to Harm Self Yes  Description of Agreement Verbal  Danger to Others  Danger to Others None reported or observed  D: Patient presented with anxious mood. Pt rated depression and anxiety as 4 on 0-10 scale. Pt reported he "feels well but working on his urinary problems". A: Medications administered as prescribed. Support and encouragement provided as needed.  R: Patient remains safe on the unit. Will continue to monitor for safety and stability.

## 2020-10-20 NOTE — Progress Notes (Signed)
Progress note    10/20/20 0732  Psych Admission Type (Psych Patients Only)  Admission Status Voluntary  Psychosocial Assessment  Patient Complaints Anxiety  Eye Contact Fair  Facial Expression Animated;Anxious  Affect Anxious  Speech Other (Comment) (deaf)  Interaction Assertive  Motor Activity Fidgety  Appearance/Hygiene Unremarkable  Behavior Characteristics Cooperative;Appropriate to situation;Anxious  Mood Anxious;Pleasant  Thought Process  Coherency WDL  Content WDL  Delusions None reported or observed  Perception WDL  Hallucination None reported or observed  Judgment Poor  Confusion None  Danger to Self  Current suicidal ideation? Denies  Danger to Others  Danger to Others None reported or observed

## 2020-10-20 NOTE — Progress Notes (Signed)
[facility] MD Progress Note  10/20/2020 10:40 AM Tyler Horn  MRN:  [id number]   In brief: Tyler Horn 60 year old male with a history of Major depressive Disorder. He presented to [facility] with complaint of worsening depression which triggered severe anxiety and suicidal ideation with a plan to cut himself with a kitchen knife. This represents one of several [facility] psychiatric admissions, the most recent previously being March 2021.   Subjective:  Patient states "I am less anxious now that I can urinate better. I'm going to give the buspar a try for my anxiety. I did not really realize that klonopin was habit forming."  Patient was seen face to face on the unit, chart reviewed and case discussed with the treatment team. Patient is hearing impaired and uses a sign language interpreter. This assessment today was done with the assistance of sign Language interpreter.  Patient Patient reports improvement in his mood, rates depression 3/10 with 10 being severe depression.  He states that as soon as he resumed taking his medications he started feeling much better.  He also was having difficuity voiding but stated that with Flomax he is also seeing some improvement as he is voiding and flow of urine is improving. Reports anxiety level 5/10 today.  Patient has been given alternative offers of Hydroxyzine and Buspirone which he agrees to take in place of Klonopin.  He was reassured that we will offer 30 days prescription for all of his medications on discharge until he sees his outpatient provider.  He denies feeling suicidal or having thoughts of wanting to hurt himself.  He also denies AVH and no Paranoia.  We will continue to monitor urine flow and urgency. Urine culture still pending.   Principal Problem: MDD (major depressive disorder), recurrent, severe, with psychosis (HCC) Diagnosis: Principal Problem:   MDD (major depressive disorder), recurrent, severe, with psychosis (HCC) Active Problems:    MDD (major depressive disorder), recurrent episode, severe (HCC)  Total Time spent with patient: 20 minutes  Past Psychiatric History: Major depressive disorder with psychosis, PTSD, Anxiety  Past Medical History:  Past Medical History:  Diagnosis Date  . Anxiety   . Depression   . HIV (human immunodeficiency virus infection) (HCC)   . HIV (human immunodeficiency virus infection) (HCC)     Past Surgical History:  Procedure Laterality Date  . CATARACT EXTRACTION Left    Family History:  Family History  Problem Relation Age of Onset  . Mental illness Mother   . Bipolar disorder Brother    Family Psychiatric  History: Denies knowing of any family history Social History:  Social History   Substance and Sexual Activity  Alcohol Use No     Social History   Substance and Sexual Activity  Drug Use No    Social History   Socioeconomic History  . Marital status: Single    Spouse name: Not on file  . Number of children: Not on file  . Years of education: Not on file  . Highest education level: Not on file  Occupational History  . Not on file  Tobacco Use  . Smoking status: Never Smoker  . Smokeless tobacco: Never Used  Substance and Sexual Activity  . Alcohol use: No  . Drug use: No  . Sexual activity: Yes  Other Topics Concern  . Not on file  Social History Narrative  . Not on file   Social Determinants of Health   Financial Resource Strain: Not on file  Food  Insecurity: Not on file  Transportation Needs: Not on file  Physical Activity: Not on file  Stress: Not on file  Social Connections: Not on file   Additional Social History:    Pain Medications: See MAR Prescriptions: See MAR Over the Counter: See MAR History of alcohol / drug use?: No history of alcohol / drug abuse     Sleep: Good  Appetite:  Good  Current Medications: Current Facility-Administered Medications  Medication Dose Route Frequency Provider Last Rate Last Admin  . acetaminophen  (TYLENOL) tablet 650 mg  650 mg Oral Q6H PRN Patrcia Dolly, FNP   650 mg at 10/19/20 2102  . alum & mag hydroxide-simeth (MAALOX/MYLANTA) 200-200-20 MG/5ML suspension 30 mL  30 mL Oral Q4H PRN Patrcia Dolly, FNP      . bictegravir-emtricitabine-tenofovir AF (BIKTARVY) 50-200-25 MG per tablet 1 tablet  1 tablet Oral Daily Patrcia Dolly, FNP   1 tablet at 10/20/20 0732  . busPIRone (BUSPAR) tablet 5 mg  5 mg Oral BID Dahlia Byes C, NP   5 mg at 10/20/20 0732  . cephALEXin (KEFLEX) capsule 500 mg  500 mg Oral Q8H Onuoha, Josephine C, NP   500 mg at 10/20/20 0733  . clonazepam (KLONOPIN) disintegrating tablet 0.25 mg  0.25 mg Oral BID PRN Dahlia Byes C, NP   0.25 mg at 10/20/20 1000  . escitalopram (LEXAPRO) tablet 10 mg  10 mg Oral Daily Armandina Stammer I, NP   10 mg at 10/20/20 0732  . feeding supplement (ENSURE ENLIVE / ENSURE PLUS) liquid 237 mL  237 mL Oral Q24H Antonieta Pert, MD   237 mL at 10/19/20 1048  . hydrOXYzine (ATARAX/VISTARIL) tablet 50 mg  50 mg Oral Q6H PRN Dahlia Byes C, NP   50 mg at 10/20/20 1000  . magnesium hydroxide (MILK OF MAGNESIA) suspension 30 mL  30 mL Oral Daily PRN Patrcia Dolly, FNP      . melatonin tablet 5 mg  5 mg Oral QHS Patrcia Dolly, FNP   5 mg at 10/19/20 2101  . QUEtiapine (SEROQUEL) tablet 100 mg  100 mg Oral QHS Armandina Stammer I, NP   100 mg at 10/19/20 2101  . tamsulosin (FLOMAX) capsule 0.4 mg  0.4 mg Oral Daily Armandina Stammer I, NP   0.4 mg at 10/20/20 0731  . Vitamin D3 (Vitamin D) tablet 1,000 Units  1,000 Units Oral Daily Patrcia Dolly, FNP   1,000 Units at 10/20/20 1751    Lab Results:  Results for orders placed or performed during the hospital encounter of 10/16/20 (from the past 48 hour(s))  Urinalysis, Complete w Microscopic Urine, Random     Status: Abnormal   Collection Time: 10/18/20  2:57 PM  Result Value Ref Range   Color, Urine AMBER (A) YELLOW    Comment: BIOCHEMICALS MAY BE AFFECTED BY COLOR   APPearance TURBID (A) CLEAR    Specific Gravity, Urine 1.029 1.005 - 1.030   pH 5.0 5.0 - 8.0   Glucose, UA NEGATIVE NEGATIVE mg/dL   Hgb urine dipstick NEGATIVE NEGATIVE   Bilirubin Urine NEGATIVE NEGATIVE   Ketones, ur NEGATIVE NEGATIVE mg/dL   Protein, ur 30 (A) NEGATIVE mg/dL   Nitrite NEGATIVE NEGATIVE   Leukocytes,Ua LARGE (A) NEGATIVE   RBC / HPF 0-5 0 - 5 RBC/hpf   WBC, UA >50 (H) 0 - 5 WBC/hpf   Bacteria, UA RARE (A) NONE SEEN   WBC Clumps PRESENT    Mucus PRESENT  Comment: Performed at [facility], [address] [place], [place] [address]    Blood Alcohol level:  Lab Results  Component Value Date   ETH <10 11/26/2019   ETH <10 10/26/2019    Metabolic Disorder Labs: Lab Results  Component Value Date   HGBA1C 5.1 10/18/2020   MPG 99.67 10/18/2020   MPG 99.67 07/15/2017   No results found for: PROLACTIN Lab Results  Component Value Date   CHOL 142 10/18/2020   TRIG 52 10/18/2020   HDL 39 (L) 10/18/2020   CHOLHDL 3.6 10/18/2020   VLDL 10 10/18/2020   LDLCALC 93 10/18/2020   LDLCALC 123 (H) 11/28/2019    Physical Findings: AIMS: Facial and Oral Movements Muscles of Facial Expression: None, normal Lips and Perioral Area: None, normal Jaw: None, normal Tongue: None, normal,Extremity Movements Upper (arms, wrists, hands, fingers): None, normal Lower (legs, knees, ankles, toes): None, normal, Trunk Movements Neck, shoulders, hips: None, normal, Overall Severity Severity of abnormal movements (highest score from questions above): None, normal Incapacitation due to abnormal movements: None, normal Patient's awareness of abnormal movements (rate only patient's report): No Awareness, Dental Status Current problems with teeth and/or dentures?: No Does patient usually wear dentures?: No  CIWA:  CIWA-Ar Total: 3 COWS:  COWS Total Score: 3  Musculoskeletal: Strength & Muscle Tone: within normal limits Gait & Station: normal Patient leans: N/A  Psychiatric Specialty  Exam: Physical Exam Constitutional:      Appearance: Normal appearance.  HENT:     Head: Normocephalic.  Musculoskeletal:        General: Normal range of motion.     Cervical back: Normal range of motion.  Neurological:     Mental Status: He is alert and oriented to person, place, and time.  Psychiatric:        Attention and Perception: Attention normal.        Mood and Affect: Mood is anxious and depressed.        Behavior: Behavior is cooperative.     Comments: Poor language skills, patient is deaf      Review of Systems  Constitutional: Negative.   HENT: Negative for rhinorrhea and sneezing.        Patient is hearing impaired   Gastrointestinal: Negative.   Genitourinary: Positive for urgency. Negative for dysuria and flank pain.       Patient taking Flomax    Neurological: Negative.   Psychiatric/Behavioral: The patient is nervous/anxious.        Depression     Blood pressure 109/74, pulse (!) 107, temperature (!) 97.5 F (36.4 C), temperature source Oral, resp. rate 20, height 6\' 4"  (1.93 m), weight 89.8 kg, SpO2 95 %.Body mass index is 24.1 kg/m.  General Appearance: Casual, adequate hygiene  Eye Contact:  Good  Speech:  Garbled, patient is deaf  Volume:  Uses Sign Languate with SIGN language interpreter., patient is deaf  Mood:  Depressed-improving  Affect:  Congruent, Depressed and some improvement since starting on his medications.  Thought Process:  Coherent, Goal Directed and Descriptions of Associations: Intact  Orientation:  Full (Time, Place, and Person)  Thought Content:  Logical and Denies AH since getting back on his medications.  Suicidal Thoughts:  No  Homicidal Thoughts:  No  Memory:  Immediate;   Good Recent;   Good Remote;   Fair  Judgement:  Fair  Insight:  Present  Psychomotor Activity:  Normal  Concentration:  Concentration: Fair and Attention Span: Fair  Recall:  Fair  Fund of Knowledge:  Good  Language:  Patient is deaf, poor language  skills  Akathisia:  No  Handed:  Right  AIMS (if indicated):     Assets:  Desire for Improvement Housing Resilience Social Support Talents/Skills  ADL's:  Intact  Cognition:  WNL  Sleep:  Number of Hours: 6.75     Treatment Plan Summary:  Daily contact with patient to assess and evaluate symptoms and progress in treatment and Medication management   1. Admit for crisis management and stabilization, estimated length of stay 3-5 days.   2. Medication management to reduce current symptoms to base line and improve the patient's overall level of functioning:See MAR, Md's SRA &treatment plan.  Depression & Anxiety:                  Change Clonazepam 0.5 mg  To 0.25 mg po bid as needed for anxiety.  Plan to wean off  before Discharge. Patient is using less frequently                 Continue Lexapro 10 mg po daily for depression.                 Continue Seroquel  100 mg po Q hs for mood control.                 Continue Vistaril 50 mg po q 6 hours prn for anxiety.                 Continue trial of gabapentin 100 mg po tid for agitation     Increase Buspirone to 7.5 mg po bid for anxiety..  Urinary Retention & Frequency:                    Continue Flomax 0.4 mg daily   HIV:                     Continue Biktarvy 50-200-25 mg daily ( patient'shome                     medication)     Johnesha Acheampong, NP -PMHNP-BC 10/20/2020, 10:40 AM

## 2020-10-20 NOTE — Progress Notes (Signed)
Progress note

## 2020-10-20 NOTE — [facility] Group Notes (Signed)
LCSW Group Therapy Note  10/20/2020  1:00pm  Type of Therapy and Topic:  Group Therapy - Healthy vs Unhealthy Coping Skills  Participation Level:  Active   Description of Group The focus of this group was to determine what unhealthy coping techniques typically are used by group members and what healthy coping techniques would be helpful in coping with various problems. Patients were guided in becoming aware of the differences between healthy and unhealthy coping techniques. Patients were asked to identify 2-3 healthy coping skills they would like to learn to use more effectively.  Therapeutic Goals 1. Patients learned that coping is what human beings do all day long to deal with various situations in their lives 2. Patients defined and discussed healthy vs unhealthy coping techniques 3. Patients identified their preferred coping techniques and identified whether these were healthy or unhealthy 4. Patients determined 2-3 healthy coping skills they would like to become more familiar with and use more often. 5. Patients provided support and ideas to each other   Summary of Patient Progress:  During group, Ekam expressed that he uses the sun as part of his therapy. Patient proved open to input from peers and feedback from CSW. Patient demonstrated that he is open to engaging with peers and trying new things such as basketball. Ankush was respectful of peers, and participated throughout the entire session.   Therapeutic Modalities Cognitive Behavioral Therapy Motivational Interviewing  Catha Brow 10/20/2020  12:49 PM

## 2020-10-20 NOTE — Progress Notes (Signed)
Recreation Therapy Notes  Date: 2.21.22 Time: 0930 Location: [address] Group Room  Group Topic: Stress Management   Goal Area(s) Addresses:  Patient will actively participate in stress management techniques presented during session.   Intervention: Stress management techniques  Activity: Guided Imagery.  LRT read a script that took patients on a journey to enjoy the sights, sounds and adventure of what the forest has to offer.  Patients were to sit quietly, focus on breathing and imagine themselves in the peaceful wonders of the forest.  Education:  Stress Management, Discharge Planning.   Education Outcome: Acknowledges education  Clinical Observations/Feedback: Patient did not attend group session.    Caroll Rancher, LRT/CTRS         Lillia Abed, Terrilee Dudzik A 10/20/2020 11:12 AM

## 2020-10-20 NOTE — Plan of Care (Signed)
  Problem: Education: Goal: Knowledge of  General Education information/materials will improve Outcome: Progressing Goal: Emotional status will improve Outcome: Progressing Goal: Mental status will improve Outcome: Progressing Goal: Verbalization of understanding the information provided will improve Outcome: Progressing   

## 2020-10-21 LAB — URINE CULTURE: Culture: >=100000 — AB

## 2020-10-21 MED ORDER — CLONAZEPAM 0.25 MG PO TBDP
0.5000 mg | ORAL_TABLET | Freq: Two times a day (BID) | ORAL | Status: AC | PRN
  Administered 2020-10-21: 0.5 mg via ORAL
  Filled 2020-10-21: qty 2

## 2020-10-21 MED ORDER — TAMSULOSIN HCL 0.4 MG PO CAPS
0.8000 mg | ORAL_CAPSULE | Freq: Every day | ORAL | Status: DC
  Administered 2020-10-22 – 2020-10-23 (×2): 0.8 mg via ORAL
  Filled 2020-10-21 (×3): qty 2

## 2020-10-21 MED ORDER — BUSPIRONE HCL 10 MG PO TABS
10.0000 mg | ORAL_TABLET | Freq: Three times a day (TID) | ORAL | Status: DC
  Administered 2020-10-21 – 2020-10-22 (×2): 10 mg via ORAL
  Filled 2020-10-21 (×2): qty 1
  Filled 2020-10-21: qty 2
  Filled 2020-10-21 (×2): qty 1
  Filled 2020-10-21: qty 2
  Filled 2020-10-21: qty 1

## 2020-10-21 MED ORDER — TAMSULOSIN HCL 0.4 MG PO CAPS
0.4000 mg | ORAL_CAPSULE | Freq: Every day | ORAL | Status: AC
  Administered 2020-10-21: 0.4 mg via ORAL
  Filled 2020-10-21: qty 1

## 2020-10-21 NOTE — Progress Notes (Signed)
   10/21/20 2126  Psych Admission Type (Psych Patients Only)  Admission Status Voluntary  Psychosocial Assessment  Patient Complaints Other (Comment) (preoccupied with urination)  Eye Contact Fair  Facial Expression Animated  Affect Preoccupied  Speech Other (Comment) (deaf)  Interaction Assertive  Motor Activity Other (Comment) (wnl)  Appearance/Hygiene Unremarkable  Behavior Characteristics Cooperative  Mood Pleasant  Thought Process  Coherency WDL  Content WDL  Delusions None reported or observed  Perception WDL  Hallucination None reported or observed  Judgment Poor  Confusion None  Danger to Self  Current suicidal ideation? Denies  Danger to Others  Danger to Others None reported or observed   Pt seen in room. Assessed using ASL interpreter. Pt denies SI, HI, AVH and pain. Says Buspar is helping with his anxiety today.

## 2020-10-21 NOTE — Progress Notes (Signed)
Recreation Therapy Notes  Animal-Assisted Activity (AAA) Program Checklist/Progress Notes Patient Eligibility Criteria Checklist & Daily Group note for Rec Tx Intervention  Date: 2.22.22 Time: 1430 Location: [address]   AAA/T Program Assumption of Risk Form signed by Engineer, production or Parent Legal Guardian YES   Patient is free of allergies or severe asthma YES  Patient reports no fear of animals YES   Patient reports no history of cruelty to animals YES  Patient understands his/her participation is voluntary YES  Patient washes hands before animal contact YES   Patient washes hands after animal contact YES   Behavioral Response: Engaged  Education: Charity fundraiser, Appropriate Animal Interaction   Education Outcome: Acknowledges understanding/In group clarification offered/Needs additional education.   Clinical Observations/Feedback: Pt attended and participated in activity.    Caroll Rancher, LRT/CTRS         Caroll Rancher A 10/21/2020 3:57 PM

## 2020-10-21 NOTE — [facility] Suicide Risk Assessment (Signed)
[facility] INPATIENT:  Family/Significant Other Suicide Prevention Education  Suicide Prevention Education:  Education Completed; Tyler Horn [phone] (Mother) has been identified by the patient as the family member/significant other with whom the patient will be residing, and identified as the person(s) who will aid the patient in the event of a mental health crisis (suicidal ideations/suicide attempt).  With written consent from the patient, the family member/significant other has been provided the following suicide prevention education, prior to the and/or following the discharge of the patient.  The suicide prevention education provided includes the following:  Suicide risk factors  Suicide prevention and interventions  National Suicide Hotline telephone number  [facility] assessment telephone number  [facility] Emergency Assistance 911  [facility] and/or Residential Mobile Crisis Unit telephone number  Request made of family/significant other to:  Remove weapons (e.g., guns, rifles, knives), all items previously/currently identified as safety concern.    Remove drugs/medications (over-the-counter, prescriptions, illicit drugs), all items previously/currently identified as a safety concern.  The family member/significant other verbalizes understanding of the suicide prevention education information provided.  The family member/significant other agrees to remove the items of safety concern listed above.  CSW spoke with Tyler Horn who states that her son was taking his medications but they expired and when he went to his P.A. Tyler Horn, he was told that he had to go to a psychiatrist instead.  Tyler Horn states that her son did not have a psychiatrist and went without his medications for several days.  Tyler Horn states that her son has been working with case worker by the name of Tyler Horn at [facility] [phone] or [phone] ext [phone].  Tyler Horn states that Tyler Horn set her son up with an appointment to see Tyler Horn on March 3rd to get his regular medications and to add Xanax to his current medications.  Tyler Horn states that she lives in [place] and that her son lives here in [place] alone.  Tyler Horn stated that she did not want to provide the CSW with any further information and stated that Tyler Horn could provide this information if it was needed. Tyler Horn also states that she does not believe there to be any firearms or weapons in the home.  CSW completed SPE with Tyler Horn.   Tyler Horn 10/21/2020, 2:14 PM

## 2020-10-21 NOTE — Progress Notes (Signed)
   10/21/20 0915  Psych Admission Type (Psych Patients Only)  Admission Status Voluntary  Psychosocial Assessment  Patient Complaints Anxiety;Shakiness  Eye Contact Fair  Facial Expression Animated;Anxious  Affect Anxious  Speech Other (Comment) (deaf)  Interaction Assertive  Motor Activity Fidgety  Appearance/Hygiene Unremarkable  Behavior Characteristics Anxious;Cooperative  Mood Anxious;Pleasant  Thought Process  Coherency WDL  Content WDL  Delusions None reported or observed  Perception WDL  Hallucination None reported or observed  Judgment Poor  Confusion None  Danger to Self  Current suicidal ideation? Denies  Self-Injurious Behavior No self-injurious ideation or behavior indicators observed or expressed   Agreement Not to Harm Self Yes  Description of Agreement Verbal  Danger to Others  Danger to Others None reported or observed

## 2020-10-21 NOTE — Progress Notes (Signed)
DAR NOTE: Pt present with bright affect and pleasant mood in the unit.  Pt denies physical pain, took all his meds as scheduled. Pt's safety ensured with 15 minute and environmental checks. Pt currently denies SI/HI and A/V hallucinations. Pt verbally agrees to seek staff if SI/HI or A/VH occurs and to consult with staff before acting on these thoughts. Will continue POC.

## 2020-10-21 NOTE — Progress Notes (Signed)
[facility] MD Progress Note  10/21/2020 12:07 PM Tyler Horn  MRN:  [id number] Subjective: Patient is a 60 year old male with a history of major depression who presented to the [facility] emergency department on 10/16/2020 with a complaint of worsening depression, worsening anxiety and suicidal ideation.  Objective: Patient is seen and examined.  Patient is a 60 year old male with the above-stated past psychiatric history and a past medical history significant for benign prostatic hypertrophy, urinary tract infection as well as hearing impairment.  The patient is seen today with the electronic translator.  He stated he continues to be anxious.  He stated his anxiety was a bit better yesterday, but it is back to where it was.  He is very much concerned about his urinary system.  He stated he felt as though his ability to maintain a stream of urine had improved yesterday, but he is more focused on worrying about it today.  He had been written to start buspirone 7.5 mg p.o. twice daily for anxiety.  He also continues to have hydroxyzine as well as clonazepam.  He stated his sleep was good.  He denied any suicidal or homicidal ideation, but stated that when his anxiety gets very high he becomes more focused on suicidal things.  Review of his laboratories from 2/14 showed normal electrolytes with a creatinine at 1.09.  Lipid panel was normal.  His CBC showed an elevation of his hemoglobin and hematocrit at 18.1 and 52.3.  The rest of his CBC was normal.  Differential was all normal.  Hemoglobin A1c was 5.1.  TSH was 2.025.  Respiratory panel for influenza A, B and coronavirus were all negative.  Urinalysis on 2/19 showed his urine to be turbid, large leukocytes, 30 mg per DL of protein, rare bacteria, and white blood cells were present with greater than 50 white blood cells.  Squamous epithelial cells were 0-5 showing a good sample.  His urine culture showed greater than 100,000 colonies of E. coli.  It was resistant to  ampicillin, ampicillin and sulbactam as well as trimethoprim 1 sulfa.  It was sensitive to cefazolin and the patient has been treated with Keflex throughout the hospitalization.  His vital signs are stable, he is afebrile.  He slept 6.75 hours last night.  He denied any suicidal or homicidal ideation.  He denied any auditory or visual hallucinations.  Principal Problem: MDD (major depressive disorder), recurrent, severe, with psychosis (HCC) Diagnosis: Principal Problem:   MDD (major depressive disorder), recurrent, severe, with psychosis (HCC) Active Problems:   MDD (major depressive disorder), recurrent episode, severe (HCC)  Total Time spent with patient: 20 minutes  Past Psychiatric History: See admission H&P  Past Medical History:  Past Medical History:  Diagnosis Date  . Anxiety   . Depression   . HIV (human immunodeficiency virus infection) (HCC)   . HIV (human immunodeficiency virus infection) (HCC)     Past Surgical History:  Procedure Laterality Date  . CATARACT EXTRACTION Left    Family History:  Family History  Problem Relation Age of Onset  . Mental illness Mother   . Bipolar disorder Brother    Family Psychiatric  History: See admission H&P Social History:  Social History   Substance and Sexual Activity  Alcohol Use No     Social History   Substance and Sexual Activity  Drug Use No    Social History   Socioeconomic History  . Marital status: Single    Spouse name: Not on file  . Number  of children: Not on file  . Years of education: Not on file  . Highest education level: Not on file  Occupational History  . Not on file  Tobacco Use  . Smoking status: Never Smoker  . Smokeless tobacco: Never Used  Substance and Sexual Activity  . Alcohol use: No  . Drug use: No  . Sexual activity: Yes  Other Topics Concern  . Not on file  Social History Narrative  . Not on file   Social Determinants of Health   Financial Resource Strain: Not on file   Food Insecurity: Not on file  Transportation Needs: Not on file  Physical Activity: Not on file  Stress: Not on file  Social Connections: Not on file   Additional Social History:    Pain Medications: See MAR Prescriptions: See MAR Over the Counter: See MAR History of alcohol / drug use?: No history of alcohol / drug abuse                    Sleep: Good  Appetite:  Good  Current Medications: Current Facility-Administered Medications  Medication Dose Route Frequency Provider Last Rate Last Admin  . acetaminophen (TYLENOL) tablet 650 mg  650 mg Oral Q6H PRN Patrcia Dollyate, Tina L, FNP   650 mg at 10/19/20 2102  . alum & mag hydroxide-simeth (MAALOX/MYLANTA) 200-200-20 MG/5ML suspension 30 mL  30 mL Oral Q4H PRN Patrcia Dollyate, Tina L, FNP      . bictegravir-emtricitabine-tenofovir AF (BIKTARVY) 50-200-25 MG per tablet 1 tablet  1 tablet Oral Daily Patrcia Dollyate, Tina L, FNP   1 tablet at 10/21/20 0920  . busPIRone (BUSPAR) tablet 10 mg  10 mg Oral TID Antonieta Pertlary, Greg Lawson, MD      . cephALEXin [facility](KEFLEX) capsule 500 mg  500 mg Oral Q8H Onuoha, Josephine C, NP   500 mg at 10/21/20 [id number]  . clonazePAM (KLONOPIN) disintegrating tablet 0.5 mg  0.5 mg Oral BID PRN Antonieta Pertlary, Greg Lawson, MD      . escitalopram (LEXAPRO) tablet 20 mg  20 mg Oral Daily Cristofano, Worthy RancherPaul A, MD   20 mg at 10/21/20 [id number]  . feeding supplement (ENSURE ENLIVE / ENSURE PLUS) liquid 237 mL  237 mL Oral Q24H Antonieta Pertlary, Greg Lawson, MD   237 mL at 10/20/20 1452  . hydrOXYzine (ATARAX/VISTARIL) tablet 50 mg  50 mg Oral Q6H PRN Dahlia Byesnuoha, Josephine C, NP   50 mg at 10/21/20 0924  . magnesium hydroxide (MILK OF MAGNESIA) suspension 30 mL  30 mL Oral Daily PRN Patrcia Dollyate, Tina L, FNP      . QUEtiapine (SEROQUEL) tablet 100 mg  100 mg Oral QHS Armandina StammerNwoko, Agnes I, NP   100 mg at 10/20/20 2042  . tamsulosin (FLOMAX) capsule 0.4 mg  0.4 mg Oral QPC supper Antonieta Pertlary, Greg Lawson, MD      . Melene Muller[START ON 10/22/2020] tamsulosin [facility](FLOMAX) capsule 0.8 mg  0.8 mg Oral Daily Jola Babinskilary, Marlane MingleGreg  Lawson, MD      . Vitamin D3 (Vitamin D) tablet 1,000 Units  1,000 Units Oral Daily Patrcia Dollyate, Tina L, FNP   1,000 Units at 10/21/20 [id number]    Lab Results: No results found for this or any previous visit (from the past 48 hour(s)).  Blood Alcohol level:  Lab Results  Component Value Date   ETH <10 11/26/2019   ETH <10 10/26/2019    Metabolic Disorder Labs: Lab Results  Component Value Date   HGBA1C 5.1 10/18/2020   MPG 99.67 10/18/2020   MPG 99.67  07/15/2017   No results found for: PROLACTIN Lab Results  Component Value Date   CHOL 142 10/18/2020   TRIG 52 10/18/2020   HDL 39 (L) 10/18/2020   CHOLHDL 3.6 10/18/2020   VLDL 10 10/18/2020   LDLCALC 93 10/18/2020   LDLCALC 123 (H) 11/28/2019    Physical Findings: AIMS: Facial and Oral Movements Muscles of Facial Expression: None, normal Lips and Perioral Area: None, normal Jaw: None, normal Tongue: None, normal,Extremity Movements Upper (arms, wrists, hands, fingers): None, normal Lower (legs, knees, ankles, toes): None, normal, Trunk Movements Neck, shoulders, hips: None, normal, Overall Severity Severity of abnormal movements (highest score from questions above): None, normal Incapacitation due to abnormal movements: None, normal Patient's awareness of abnormal movements (rate only patient's report): No Awareness, Dental Status Current problems with teeth and/or dentures?: No Does patient usually wear dentures?: No  CIWA:  CIWA-Ar Total: 3 COWS:  COWS Total Score: 3  Musculoskeletal: Strength & Muscle Tone: within normal limits Gait & Station: normal Patient leans: N/A  Psychiatric Specialty Exam: Physical Exam Vitals and nursing note reviewed.     Review of Systems  Blood pressure 108/88, pulse (!) 109, temperature 97.7 F (36.5 C), temperature source Oral, resp. rate 18, height 6\' 4"  (1.93 m), weight 89.8 kg, SpO2 98 %.Body mass index is 24.1 kg/m.  General Appearance: Casual  Eye Contact:  Fair  Speech:  He  is hearing impaired, and we used the for American sign language.  Volume:  Patient spoke with sign language.  Mood:  Anxious  Affect:  Congruent  Thought Process:  Coherent and Descriptions of Associations: Intact  Orientation:  Full (Time, Place, and Person)  Thought Content:  Rumination  Suicidal Thoughts:  No  Homicidal Thoughts:  No  Memory:  Immediate;   Good Recent;   Good Remote;   Good  Judgement:  Intact  Insight:  Fair  Psychomotor Activity:  Increased  Concentration:  Concentration: Fair and Attention Span: Fair  Recall:  Manufacturing engineer of Knowledge:  Fair  Language:  Good  Akathisia:  Negative  Handed:  Right  AIMS (if indicated):     Assets:  Desire for Improvement Resilience  ADL's:  Intact  Cognition:  WNL  Sleep:  Number of Hours: 6.75     Treatment Plan Summary: Daily contact with patient to assess and evaluate symptoms and progress in treatment, Medication management and Plan : Patient is seen and examined.  Patient is a 60 year old male with the above-stated past psychiatric history who is seen in follow-up.   Diagnosis: 1.  Major depression, recurrent, severe without psychotic features. 2.  Generalized anxiety disorder. 3.  Benign prostatic hypertrophy. 4.  Urinary tract infection  Pertinent findings on examination today: 1.  Patient remains significantly anxious. 2.  He stated his mood was better, but the anxiety often causes his mood to worsen. 3.  He still continues to have problems with regard to his urinary flow.  Plan: 1.  Increase buspirone to 10 mg p.o. 3 times daily for generalized anxiety. 2.  Continue Biktarvy 50 mg/200 mg / 25 mg p.o. daily for HIV. 3.  Continue Keflex 500 mg p.o. every 8 hours for urinary tract infection. 4.  Increase clonazepam to 0.5 mg p.o. twice daily as needed anxiety. 5.  Continue Lexapro 20 mg p.o. daily for anxiety and depression. 6.  Continue Seroquel 100 mg p.o. nightly for mood stability  and insomnia. 7.  Increase Flomax to 0.8 mg p.o. daily  for benign prostatic hypertrophy. 8.  Continue vitamin D at 1000 units p.o. daily for nutritional supplementation. 9.  Disposition planning-in progress.  Antonieta Pert, MD 10/21/2020, 12:07 PM

## 2020-10-22 ENCOUNTER — Encounter: Payer: Self-pay | Admitting: Family

## 2020-10-22 MED ORDER — BUSPIRONE HCL 15 MG PO TABS
15.0000 mg | ORAL_TABLET | Freq: Three times a day (TID) | ORAL | Status: DC
  Administered 2020-10-22 – 2020-10-23 (×2): 15 mg via ORAL
  Filled 2020-10-22 (×5): qty 1

## 2020-10-22 MED ORDER — CLONAZEPAM 0.25 MG PO TBDP
0.5000 mg | ORAL_TABLET | Freq: Two times a day (BID) | ORAL | Status: AC | PRN
  Administered 2020-10-22: 0.5 mg via ORAL
  Filled 2020-10-22: qty 2

## 2020-10-22 NOTE — Progress Notes (Signed)
[facility] MD Progress Note  10/22/2020 12:22 PM Tyler Horn  MRN:  [id number] Subjective:  Patient is a 60 year old male with a history of major depression who presented to the [facility] emergency department on 10/16/2020 with a complaint of worsening depression, worsening anxiety and suicidal ideation.  Objective: Patient is seen and examined.  Patient is a 60 year old male with the above-stated past psychiatric history and a past medical history significant for benign prostatic hypertrophy, urinary tract infection as well as hearing impairment.  The patient is seen today with electronic translator.  The patient stated his anxiety was slightly better than yesterday.  He appears to be less tremulous today.  He was also seen with nursing today.  He stated he is attempting to not use the Klonopin given its potential for being habit-forming.  He stated today that he had been on the combination of the Seroquel and the Lexapro for 15 years, and been off of it for approximately a month.  We discussed the fact that it would probably take 3 to 4 weeks before it really had benefit.  He denied suicidal or homicidal ideation.  We discussed whether or not he would be stable to be able to be discharged to home tomorrow, and be less isolated.  During the course of the hospitalization he has basically remained in his room.  He has not been able to go to groups because of his anxiety.  He stated that he has an appointment tomorrow morning with his HIV doctor, and after that has a therapy appointment.  He is fine with being discharged tomorrow if we can get him transportation to the HIV clinic.  He denied any side effects of his current medications.  He stated his urination issues seem to be improved as well.  His vital signs are stable, he is afebrile.  Pulse oximetry on room air is 98%.  No new laboratories.  He slept 6.5 hours last night.  Principal Problem: MDD (major depressive disorder), recurrent, severe, with psychosis  (HCC) Diagnosis: Principal Problem:   MDD (major depressive disorder), recurrent, severe, with psychosis (HCC) Active Problems:   MDD (major depressive disorder), recurrent episode, severe (HCC)  Total Time spent with patient: 20 minutes  Past Psychiatric History: See admission H&P  Past Medical History:  Past Medical History:  Diagnosis Date  . Anxiety   . Depression   . HIV (human immunodeficiency virus infection) (HCC)   . HIV (human immunodeficiency virus infection) (HCC)     Past Surgical History:  Procedure Laterality Date  . CATARACT EXTRACTION Left    Family History:  Family History  Problem Relation Age of Onset  . Mental illness Mother   . Bipolar disorder Brother    Family Psychiatric  History: See admission H&P Social History:  Social History   Substance and Sexual Activity  Alcohol Use No     Social History   Substance and Sexual Activity  Drug Use No    Social History   Socioeconomic History  . Marital status: Single    Spouse name: Not on file  . Number of children: Not on file  . Years of education: Not on file  . Highest education level: Not on file  Occupational History  . Not on file  Tobacco Use  . Smoking status: Never Smoker  . Smokeless tobacco: Never Used  Substance and Sexual Activity  . Alcohol use: No  . Drug use: No  . Sexual activity: Yes  Other Topics Concern  .  Not on file  Social History Narrative  . Not on file   Social Determinants of Health   Financial Resource Strain: Not on file  Food Insecurity: Not on file  Transportation Needs: Not on file  Physical Activity: Not on file  Stress: Not on file  Social Connections: Not on file   Additional Social History:    Pain Medications: See MAR Prescriptions: See MAR Over the Counter: See MAR History of alcohol / drug use?: No history of alcohol / drug abuse                    Sleep: Good  Appetite:  Fair  Current Medications: Current  Facility-Administered Medications  Medication Dose Route Frequency Provider Last Rate Last Admin  . acetaminophen (TYLENOL) tablet 650 mg  650 mg Oral Q6H PRN Patrcia Dolly, FNP   650 mg at 10/19/20 2102  . alum & mag hydroxide-simeth (MAALOX/MYLANTA) 200-200-20 MG/5ML suspension 30 mL  30 mL Oral Q4H PRN Patrcia Dolly, FNP      . bictegravir-emtricitabine-tenofovir AF (BIKTARVY) 50-200-25 MG per tablet 1 tablet  1 tablet Oral Daily Patrcia Dolly, FNP   1 tablet at 10/22/20 0932  . busPIRone (BUSPAR) tablet 15 mg  15 mg Oral TID Antonieta Pert, MD      . cephALEXin [facility]) capsule 500 mg  500 mg Oral Q8H Onuoha, Josephine C, NP   500 mg at 10/22/20 1062  . escitalopram (LEXAPRO) tablet 20 mg  20 mg Oral Daily Cristofano, Worthy Rancher, MD   20 mg at 10/22/20 0932  . feeding supplement (ENSURE ENLIVE / ENSURE PLUS) liquid 237 mL  237 mL Oral Q24H Antonieta Pert, MD   237 mL at 10/21/20 1543  . hydrOXYzine (ATARAX/VISTARIL) tablet 50 mg  50 mg Oral Q6H PRN Dahlia Byes C, NP   50 mg at 10/22/20 0931  . magnesium hydroxide (MILK OF MAGNESIA) suspension 30 mL  30 mL Oral Daily PRN Patrcia Dolly, FNP      . QUEtiapine (SEROQUEL) tablet 100 mg  100 mg Oral QHS Armandina Stammer I, NP   100 mg at 10/21/20 2109  . tamsulosin (FLOMAX) capsule 0.8 mg  0.8 mg Oral Daily Antonieta Pert, MD   0.8 mg at 10/22/20 0932  . Vitamin D3 (Vitamin D) tablet 1,000 Units  1,000 Units Oral Daily Patrcia Dolly, FNP   1,000 Units at 10/22/20 6948    Lab Results: No results found for this or any previous visit (from the past 48 hour(s)).  Blood Alcohol level:  Lab Results  Component Value Date   ETH <10 11/26/2019   ETH <10 10/26/2019    Metabolic Disorder Labs: Lab Results  Component Value Date   HGBA1C 5.1 10/18/2020   MPG 99.67 10/18/2020   MPG 99.67 07/15/2017   No results found for: PROLACTIN Lab Results  Component Value Date   CHOL 142 10/18/2020   TRIG 52 10/18/2020   HDL 39 (L) 10/18/2020    CHOLHDL 3.6 10/18/2020   VLDL 10 10/18/2020   LDLCALC 93 10/18/2020   LDLCALC 123 (H) 11/28/2019    Physical Findings: AIMS: Facial and Oral Movements Muscles of Facial Expression: None, normal Lips and Perioral Area: None, normal Jaw: None, normal Tongue: None, normal,Extremity Movements Upper (arms, wrists, hands, fingers): None, normal Lower (legs, knees, ankles, toes): None, normal, Trunk Movements Neck, shoulders, hips: None, normal, Overall Severity Severity of abnormal movements (highest score from questions above): None,  normal Incapacitation due to abnormal movements: None, normal Patient's awareness of abnormal movements (rate only patient's report): No Awareness, Dental Status Current problems with teeth and/or dentures?: No Does patient usually wear dentures?: No  CIWA:  CIWA-Ar Total: 3 COWS:  COWS Total Score: 3  Musculoskeletal: Strength & Muscle Tone: within normal limits Gait & Station: normal Patient leans: N/A  Psychiatric Specialty Exam: Physical Exam Vitals and nursing note reviewed.  Constitutional:      Appearance: Normal appearance.  HENT:     Head: Normocephalic and atraumatic.  Pulmonary:     Effort: Pulmonary effort is normal.  Neurological:     Mental Status: He is alert and oriented to person, place, and time.     Review of Systems  Blood pressure (!) 103/38, pulse 79, temperature (!) 97.4 F (36.3 C), temperature source Oral, resp. rate 18, height 6\' 4"  (1.93 m), weight 89.8 kg, SpO2 98 %.Body mass index is 24.1 kg/m.  General Appearance: Casual  Eye Contact:  Fair  Speech:  Patient is hearing impaired, and uses sign language.  Volume:  Patient is hearing impaired and uses sign language.  Mood:  Anxious  Affect:  Congruent  Thought Process:  Coherent and Descriptions of Associations: Intact  Orientation:  Full (Time, Place, and Person)  Thought Content:  Logical  Suicidal Thoughts:  No  Homicidal Thoughts:  No  Memory:  Immediate;    Good Recent;   Good Remote;   Good  Judgement:  Intact  Insight:  Fair  Psychomotor Activity:  Increased  Concentration:  Concentration: Good and Attention Span: Good  Recall:  Good  Fund of Knowledge:  Good  Language:  Patient is hearing impaired and uses sign language.  Akathisia:  Negative  Handed:  Right  AIMS (if indicated):     Assets:  Desire for Improvement Financial Resources/Insurance Housing Resilience Social Support Talents/Skills  ADL's:  Intact  Cognition:  WNL  Sleep:  Number of Hours: 6.5     Treatment Plan Summary: Daily contact with patient to assess and evaluate symptoms and progress in treatment, Medication management and Plan : Patient is seen and examined.  Patient is a 60 year old male with the above-stated past psychiatric history who is seen in follow-up.   Diagnosis: 1.  Major depression, recurrent, severe without psychotic features. 2.  Generalized anxiety disorder. 3.  Benign prostatic hypertrophy. 4.  Urinary tract infection  Pertinent findings on examination today: 1.  Anxiety is slightly decreased. 2.  Depression is under better control. 3.  Less complaints of urinary retention. 4.  No suicidality or homicidality.  Plan: 1.  Increase buspirone to 15 mg p.o. 3 times daily for generalized anxiety. 2.  Continue Biktarvy 50 mg/200 mg / 25 mg p.o. daily for HIV. 3.  Continue Keflex 500 mg p.o. every 8 hours for urinary tract infection. 4.  Increase clonazepam to 0.5 mg p.o. twice daily as needed anxiety. 5.  Continue Lexapro 20 mg p.o. daily for anxiety and depression. 6.  Continue Seroquel 100 mg p.o. nightly for mood stability and insomnia. 7.  Continue Flomax to 0.8 mg p.o. daily for benign prostatic hypertrophy. 8.  Continue vitamin D at 1000 units p.o. daily for nutritional supplementation. 9.  Disposition planning-in progress, if all goes well tonight we will plan on discharge tomorrow morning.  46, MD 10/22/2020,  12:22 PM

## 2020-10-22 NOTE — Tx Team (Signed)
Interdisciplinary Treatment and Diagnostic Plan Update  10/22/2020 Time of Session: 9:35am  Tyler Horn MRN: [id number]  Principal Diagnosis: MDD (major depressive disorder), recurrent, severe, with psychosis (HCC)  Secondary Diagnoses: Principal Problem:   MDD (major depressive disorder), recurrent, severe, with psychosis (HCC) Active Problems:   MDD (major depressive disorder), recurrent episode, severe (HCC)   Current Medications:  Current Facility-Administered Medications  Medication Dose Route Frequency Provider Last Rate Last Admin  . acetaminophen (TYLENOL) tablet 650 mg  650 mg Oral Q6H PRN Patrcia Dolly, FNP   650 mg at 10/19/20 2102  . alum & mag hydroxide-simeth (MAALOX/MYLANTA) 200-200-20 MG/5ML suspension 30 mL  30 mL Oral Q4H PRN Patrcia Dolly, FNP      . bictegravir-emtricitabine-tenofovir AF (BIKTARVY) 50-200-25 MG per tablet 1 tablet  1 tablet Oral Daily Patrcia Dolly, FNP   1 tablet at 10/21/20 0920  . busPIRone (BUSPAR) tablet 10 mg  10 mg Oral TID Antonieta Pert, MD   10 mg at 10/21/20 1809  . cephALEXin (KEFLEX) capsule 500 mg  500 mg Oral Q8H Onuoha, Josephine C, NP   500 mg at 10/22/20 0962  . escitalopram (LEXAPRO) tablet 20 mg  20 mg Oral Daily Cristofano, Worthy Rancher, MD   20 mg at 10/21/20 8366  . feeding supplement (ENSURE ENLIVE / ENSURE PLUS) liquid 237 mL  237 mL Oral Q24H Antonieta Pert, MD   237 mL at 10/21/20 1543  . hydrOXYzine (ATARAX/VISTARIL) tablet 50 mg  50 mg Oral Q6H PRN Dahlia Byes C, NP   50 mg at 10/21/20 1814  . magnesium hydroxide (MILK OF MAGNESIA) suspension 30 mL  30 mL Oral Daily PRN Patrcia Dolly, FNP      . QUEtiapine (SEROQUEL) tablet 100 mg  100 mg Oral QHS Armandina Stammer I, NP   100 mg at 10/21/20 2109  . tamsulosin (FLOMAX) capsule 0.8 mg  0.8 mg Oral Daily Jola Babinski, Marlane Mingle, MD      . Vitamin D3 (Vitamin D) tablet 1,000 Units  1,000 Units Oral Daily Patrcia Dolly, FNP   1,000 Units at 10/21/20 0920   PTA  Medications: Medications Prior to Admission  Medication Sig Dispense Refill Last Dose  . bictegravir-emtricitabine-tenofovir AF (BIKTARVY) 50-200-25 MG TABS tablet Take 1 tablet by mouth daily. 30 tablet 2   . cholecalciferol (VITAMIN D3) 25 MCG (1000 UNIT) tablet Take 1,000 Units by mouth daily.     Marland Kitchen escitalopram (LEXAPRO) 20 MG tablet Take 20 mg by mouth daily.     . hydrOXYzine (ATARAX/VISTARIL) 25 MG tablet Take 1 tablet (25 mg total) by mouth 3 (three) times daily as needed for anxiety. 30 tablet 1   . ibuprofen (ADVIL) 100 MG tablet Take 100 mg by mouth every 6 (six) hours as needed for fever.     . Lactobacillus (PROBIOTIC ACIDOPHILUS PO) Take 1 tablet by mouth daily.     Marland Kitchen LORazepam (ATIVAN) 0.5 MG tablet Take 1 tablet (0.5 mg total) by mouth 3 (three) times daily as needed for up to 3 doses for anxiety. 3 tablet 0   . Omega-3 Fatty Acids (FISH OIL) 1000 MG CAPS Take 1,000 mg by mouth daily.     . prazosin (MINIPRESS) 1 MG capsule Take 1 capsule (1 mg total) by mouth at bedtime. (Patient not taking: No sig reported) 30 capsule 1   . psyllium (METAMUCIL) 58.6 % packet Take 1 packet by mouth daily.     . QUEtiapine (SEROQUEL) 200 MG  tablet TAKE 1 TABLET BY MOUTH AT BEDTIME (Patient taking differently: Take 200 mg by mouth at bedtime.) 30 tablet 1     Patient Stressors: Financial difficulties Health problems Medication change or noncompliance Traumatic event  Patient Strengths: Capable of independent living General fund of knowledge Motivation for treatment/growth Supportive family/friends  Treatment Modalities: Medication Management, Group therapy, Case management,  1 to 1 session with clinician, Psychoeducation, Recreational therapy.   Physician Treatment Plan for Primary Diagnosis: MDD (major depressive disorder), recurrent, severe, with psychosis (HCC) Long Term Goal(s): Improvement in symptoms so as ready for discharge Improvement in symptoms so as ready for discharge    Short Term Goals: Ability to identify changes in lifestyle to reduce recurrence of condition will improve Ability to verbalize feelings will improve Ability to disclose and discuss suicidal ideas Ability to demonstrate self-control will improve Ability to identify and develop effective coping behaviors will improve Ability to maintain clinical measurements within normal limits will improve Compliance with prescribed medications will improve  Medication Management: Evaluate patient's response, side effects, and tolerance of medication regimen.  Therapeutic Interventions: 1 to 1 sessions, Unit Group sessions and Medication administration.  Evaluation of Outcomes: Progressing  Physician Treatment Plan for Secondary Diagnosis: Principal Problem:   MDD (major depressive disorder), recurrent, severe, with psychosis (HCC) Active Problems:   MDD (major depressive disorder), recurrent episode, severe (HCC)  Long Term Goal(s): Improvement in symptoms so as ready for discharge Improvement in symptoms so as ready for discharge   Short Term Goals: Ability to identify changes in lifestyle to reduce recurrence of condition will improve Ability to verbalize feelings will improve Ability to disclose and discuss suicidal ideas Ability to demonstrate self-control will improve Ability to identify and develop effective coping behaviors will improve Ability to maintain clinical measurements within normal limits will improve Compliance with prescribed medications will improve     Medication Management: Evaluate patient's response, side effects, and tolerance of medication regimen.  Therapeutic Interventions: 1 to 1 sessions, Unit Group sessions and Medication administration.  Evaluation of Outcomes: Progressing   RN Treatment Plan for Primary Diagnosis: MDD (major depressive disorder), recurrent, severe, with psychosis (HCC) Long Term Goal(s): Knowledge of disease and therapeutic regimen to  maintain health will improve  Short Term Goals: Ability to remain free from injury will improve, Ability to participate in decision making will improve, Ability to verbalize feelings will improve, Ability to disclose and discuss suicidal ideas and Ability to identify and develop effective coping behaviors will improve  Medication Management: RN will administer medications as ordered by provider, will assess and evaluate patient's response and provide education to patient for prescribed medication. RN will report any adverse and/or side effects to prescribing provider.  Therapeutic Interventions: 1 on 1 counseling sessions, Psychoeducation, Medication administration, Evaluate responses to treatment, Monitor vital signs and CBGs as ordered, Perform/monitor CIWA, COWS, AIMS and Fall Risk screenings as ordered, Perform wound care treatments as ordered.  Evaluation of Outcomes: Progressing   LCSW Treatment Plan for Primary Diagnosis: MDD (major depressive disorder), recurrent, severe, with psychosis (HCC) Long Term Goal(s): Safe transition to appropriate next level of care at discharge, Engage patient in therapeutic group addressing interpersonal concerns.  Short Term Goals: Engage patient in aftercare planning with referrals and resources, Increase social support, Increase emotional regulation, Facilitate acceptance of mental health diagnosis and concerns, Identify triggers associated with mental health/substance abuse issues and Increase skills for wellness and recovery  Therapeutic Interventions: Assess for all discharge needs, 1 to 1 time with  Social worker, Explore available resources and support systems, Assess for adequacy in community support network, Educate family and significant other(s) on suicide prevention, Complete Psychosocial Assessment, Interpersonal group therapy.  Evaluation of Outcomes: Progressing   Progress in Treatment: Attending groups: Yes. Participating in groups:  Yes. Taking medication as prescribed: Yes. Toleration medication: Yes. Family/Significant other contact made: Yes, individual(s) contacted:  Mother Patient understands diagnosis: Yes. Discussing patient identified problems/goals with staff: Yes. Medical problems stabilized or resolved: Yes. Denies suicidal/homicidal ideation: Yes. Issues/concerns per patient self-inventory: No.   New problem(s) identified: No, Describe:  None   New Short Term/Long Term Goal(s): medication stabilization, elimination of SI thoughts, development of comprehensive mental wellness plan.   Patient Goals:  "To get mentally, physically, and emotionally stable"   Discharge Plan or Barriers: Patient recently admitted. CSW will continue to follow and assess for appropriate referrals and possible discharge planning.   Reason for Continuation of Hospitalization: Anxiety Medication stabilization  Estimated Length of Stay: 3 to 5 days  Attendees: Patient: Tyler Horn 10/22/2020   Physician: Landry Mellow, MD 10/22/2020   Nursing:  10/22/2020   RN Care Manager: 10/22/2020   Social Worker: Fredirick Lathe, Theresia Majors  10/22/2020   Recreational Therapist:  10/22/2020   Other:  10/22/2020   Other:  10/22/2020   Other: 10/22/2020      Scribe for Treatment Team: Felizardo Hoffmann, Theresia Majors 10/22/2020 9:08 AM

## 2020-10-22 NOTE — Progress Notes (Signed)
Recreation Therapy Notes  Date: 2.23.22 Time: 0930 Location: [address] Dayroom  Group Topic: Stress Management  Goal Area(s) Addresses:  Patient will identify positive stress management techniques. Patient will identify benefits of using stress management post d/c.  Intervention: Stress Management  Activity:  Meditation.  LRT played a meditation that focused on extending love and kindness to self and others.  Patients were to listen and follow along as meditation played to engage in activity.    Education:  Stress Management, Discharge Planning.   Education Outcome: Acknowledges Education  Clinical Observations/Feedback: Pt did not attend group session.    Caroll Rancher, LRT/CTRS         Lillia Abed, Aianna Fahs A 10/22/2020 11:10 AM

## 2020-10-22 NOTE — [facility] Group Notes (Signed)
LCSW Group Therapy Note  Type of Therapy/Topic: Group Therapy: Six Dimensions of Wellness  Participation Level: Active  Description of Group: Worksheet Packet   This group will address the concept of wellness and the six concepts of wellness: occupational, physical, social, intellectual, spiritual, and emotional. Patients will be encouraged to process areas in their lives that are out of balance and identify reasons for remaining unbalanced. Patients will be encouraged to explore ways to practice healthy habits daily to attain better physical and mental health outcomes.  Therapeutic Goals:  1. Identify aspects of wellness that they are doing well.  2. Identify aspects of wellness that they would like to improve upon.  3. Identify one action they can take to improve an aspect of wellness in their lives.  Tyler Horn accepted the worksheet packet for group.

## 2020-10-22 NOTE — Progress Notes (Signed)
Spirituality group facilitated by Chaplain Matthew Stalnaker, MDiv, BCC.  Group Description:  Group focused on topic of hope.  Patients participated in facilitated discussion around topic, connecting with one another around experiences and definitions for hope.  Group members engaged with visual explorer photos, reflecting on what hope looks like for them today.  Group engaged in discussion around how their definitions of hope are present today in hospital.   Modalities: Psycho-social ed, Adlerian, Narrative, MI Patient Progress: Pt was present during group   [name]  Counseling Intern @ [employer]  

## 2020-10-22 NOTE — Progress Notes (Signed)
Psychoeducational Group Note  Date:  10/22/2020 Time:  2250  Group Topic/Focus:  Wrap-Up Group:   The focus of this group is to help patients review their daily goal of treatment and discuss progress on daily workbooks.  Participation Level: Did Not Attend  Participation Quality:  Not Applicable  Affect:  Not Applicable  Cognitive:  Not Applicable  Insight:  Not Applicable  Engagement in Group: Not Applicable  Additional Comments:  The patient did not attend group.   Hazle Coca S 10/22/2020, 10:50 PM

## 2020-10-22 NOTE — Progress Notes (Signed)
Pt denied SI/HI/AVH.  Pt endorsed anxiety.  Pt disclosed that he would like to discharge tomorrow for a doctor's appointment and therapy appt.  Pt was able to go outside with peers today to get some fresh air.  Pt appears to be in no acute distress at this time.  Pt remains safe on unit with q 15 min checks  In place.

## 2020-10-22 NOTE — Progress Notes (Addendum)
   10/22/20 2022  Psych Admission Type (Psych Patients Only)  Admission Status Voluntary  Psychosocial Assessment  Patient Complaints Anxiety  Eye Contact Fair  Facial Expression Animated  Affect Anxious  Speech Other (Comment) (pt is deaf uses ASL)  Interaction Assertive  Motor Activity Other (Comment) (wnl)  Appearance/Hygiene Unremarkable  Behavior Characteristics Cooperative;Anxious  Mood Anxious;Pleasant  Thought Process  Coherency WDL  Content WDL  Delusions None reported or observed  Perception WDL  Hallucination None reported or observed  Judgment Poor  Confusion None  Danger to Self  Current suicidal ideation? Denies  Danger to Others  Danger to Others None reported or observed   Pt assessed using ASL interpreter Darci [phone]. Pt denied SI, HI, AVH and pain. Rated anxiety 2-3/10 d/t medications given earlier. Pt a little anxious about leaving tomorrow. Pt told that the doctor would complete his discharge orders and paperwork in the morning first thing so he can be discharged well in advance of his appointment tomorrow.

## 2020-10-23 ENCOUNTER — Ambulatory Visit (INDEPENDENT_AMBULATORY_CARE_PROVIDER_SITE_OTHER): Payer: Self-pay | Admitting: Family

## 2020-10-23 ENCOUNTER — Ambulatory Visit: Payer: Self-pay

## 2020-10-23 ENCOUNTER — Encounter: Payer: Self-pay | Admitting: Family

## 2020-10-23 ENCOUNTER — Other Ambulatory Visit: Payer: Self-pay

## 2020-10-23 VITALS — BP 109/73 | HR 102 | Temp 97.7°F | Ht 76.0 in | Wt 202.6 lb

## 2020-10-23 DIAGNOSIS — Z0000 Encounter for general adult medical examination without abnormal findings: Secondary | ICD-10-CM

## 2020-10-23 DIAGNOSIS — Z21 Asymptomatic human immunodeficiency virus [HIV] infection status: Secondary | ICD-10-CM

## 2020-10-23 DIAGNOSIS — F333 Major depressive disorder, recurrent, severe with psychotic symptoms: Secondary | ICD-10-CM

## 2020-10-23 MED ORDER — HYDROXYZINE HCL 25 MG PO TABS: 25.0000 mg | ORAL_TABLET | Freq: Three times a day (TID) | ORAL | 0 refills | Status: DC | PRN

## 2020-10-23 MED ORDER — CEPHALEXIN 500 MG PO CAPS: 500.0000 mg | ORAL_CAPSULE | Freq: Three times a day (TID) | ORAL | 0 refills | Status: DC

## 2020-10-23 MED ORDER — ESCITALOPRAM OXALATE 20 MG PO TABS: 20.0000 mg | ORAL_TABLET | Freq: Every day | ORAL | 0 refills | Status: DC

## 2020-10-23 MED ORDER — BUSPIRONE HCL 15 MG PO TABS: 15.0000 mg | ORAL_TABLET | Freq: Three times a day (TID) | ORAL | 0 refills | Status: DC

## 2020-10-23 MED ORDER — QUETIAPINE FUMARATE 100 MG PO TABS: 100.0000 mg | ORAL_TABLET | Freq: Every day | ORAL | 0 refills | Status: DC

## 2020-10-23 MED ORDER — TAMSULOSIN HCL 0.4 MG PO CAPS: 0.8000 mg | ORAL_CAPSULE | Freq: Every day | ORAL | 0 refills | Status: DC

## 2020-10-23 MED ORDER — BIKTARVY 50-200-25 MG PO TABS: 1.0000 | ORAL_TABLET | Freq: Every day | ORAL | 5 refills | Status: DC

## 2020-10-23 NOTE — Progress Notes (Signed)
Discharge Note:  Patient denies SI/HI AVH at this time. Discharge instructions, AVS, prescriptions and transition record gone over with patient. Patient agrees to comply with medication management, follow-up visit, and outpatient therapy. Patient belongings returned to patient. Patient questions and concerns addressed and answered.  Patient ambulatory off unit.  Patient discharged to home.   

## 2020-10-23 NOTE — Progress Notes (Signed)
Subjective:    Patient ID: Tyler Horn, male    DOB: Jan 21, 1961, 60 y.o.   MRN: [id number]  No chief complaint on file.    HPI:  Tyler Horn is a 60 y.o. male with HIV disease last seen on 08/07/2020 with well-controlled HIV and good adherence and tolerance to his ART regimen of Biktarvy.  Viral load at the time was undetectable with CD4 count of 667.  In the interim Tyler Horn has been to the hospital on several occasions for urinary retention and was recently released earlier today from behavioral health.  Here today for routine follow-up.  Tyler Horn is hearing impaired and a sign language interpreter is present to aid in communication.  Tyler Horn continues to take his Biktarvy daily as prescribed with no adverse side effects or missed doses since his last office visit.  Overall feeling okay today and ready to be home.  Feeling shaky and like he needs his regular medications. Denies fevers, chills, night sweats, headaches, changes in vision, neck pain/stiffness, nausea, diarrhea, vomiting, lesions or rashes.  Tyler Horn has no problems obtaining medication from the pharmacy.  He has a new psychiatrist that he will be working with starting in a week.  He has postponed his current counseling appointment due to his recent hospitalization.  He is due for routine dental care.  Declines condoms.  Wishes to hold off on vaccines.  No Known Allergies    Outpatient Medications Prior to Visit  Medication Sig Dispense Refill  . busPIRone (BUSPAR) 15 MG tablet Take 1 tablet (15 mg total) by mouth 3 (three) times daily. 90 tablet 0  . cephALEXin (KEFLEX) 500 MG capsule Take 1 capsule (500 mg total) by mouth every 8 (eight) hours. 12 capsule 0  . hydrOXYzine (ATARAX/VISTARIL) 25 MG tablet Take 1 tablet (25 mg total) by mouth 3 (three) times daily as needed for anxiety. 30 tablet 0  . ibuprofen (ADVIL) 100 MG tablet Take 100 mg by mouth every 6 (six) hours as needed for fever.    Marland Kitchen QUEtiapine  (SEROQUEL) 100 MG tablet Take 1 tablet (100 mg total) by mouth at bedtime. 30 tablet 0  . [START ON 10/24/2020] tamsulosin (FLOMAX) 0.4 MG CAPS capsule Take 2 capsules (0.8 mg total) by mouth daily. 30 capsule 0  . bictegravir-emtricitabine-tenofovir AF (BIKTARVY) 50-200-25 MG TABS tablet Take 1 tablet by mouth daily. 30 tablet 2  . cholecalciferol (VITAMIN D3) 25 MCG (1000 UNIT) tablet Take 1,000 Units by mouth daily. (Patient not taking: Reported on 10/23/2020)    . escitalopram (LEXAPRO) 20 MG tablet Take 1 tablet (20 mg total) by mouth daily. (Patient not taking: Reported on 10/23/2020) 30 tablet 0  . Lactobacillus (PROBIOTIC ACIDOPHILUS PO) Take 1 tablet by mouth daily. (Patient not taking: Reported on 10/23/2020)    . Omega-3 Fatty Acids (FISH OIL) 1000 MG CAPS Take 1,000 mg by mouth daily. (Patient not taking: Reported on 10/23/2020)    . psyllium (METAMUCIL) 58.6 % packet Take 1 packet by mouth daily. (Patient not taking: Reported on 10/23/2020)     No facility-administered medications prior to visit.     Past Medical History:  Diagnosis Date  . Anxiety   . Depression   . HIV (human immunodeficiency virus infection) (HCC)   . HIV (human immunodeficiency virus infection) (HCC)      Past Surgical History:  Procedure Laterality Date  . CATARACT EXTRACTION Left        Review of Systems  Constitutional: Negative for  appetite change, chills, fatigue, fever and unexpected weight change.  Eyes: Negative for visual disturbance.  Respiratory: Negative for cough, chest tightness, shortness of breath and wheezing.   Cardiovascular: Negative for chest pain and leg swelling.  Gastrointestinal: Negative for abdominal pain, constipation, diarrhea, nausea and vomiting.  Genitourinary: Negative for dysuria, flank pain, frequency, genital sores, hematuria and urgency.  Skin: Negative for rash.  Allergic/Immunologic: Negative for immunocompromised state.  Neurological: Negative for dizziness and  headaches.      Objective:    BP 109/73   Pulse (!) 102   Temp 97.7 F (36.5 C) (Oral)   Ht 6\' 4"  (1.93 m)   Wt 202 lb 9.6 oz (91.9 kg)   BMI 24.66 kg/m  Nursing note and vital signs reviewed.  Physical Exam Constitutional:      General: He is not in acute distress.    Appearance: He is well-developed.  HENT:     Mouth/Throat:     Mouth: Oropharynx is clear and moist.  Eyes:     Conjunctiva/sclera: Conjunctivae normal.  Cardiovascular:     Rate and Rhythm: Normal rate and regular rhythm.     Pulses: Intact distal pulses.     Heart sounds: Normal heart sounds. No murmur heard. No friction rub. No gallop.   Pulmonary:     Effort: Pulmonary effort is normal. No respiratory distress.     Breath sounds: Normal breath sounds. No wheezing or rales.  Chest:     Chest wall: No tenderness.  Abdominal:     General: Bowel sounds are normal.     Palpations: Abdomen is soft.     Tenderness: There is no abdominal tenderness.  Musculoskeletal:     Cervical back: Neck supple.  Lymphadenopathy:     Cervical: No cervical adenopathy.  Skin:    General: Skin is warm and dry.     Findings: No rash.  Neurological:     Mental Status: He is alert and oriented to person, place, and time.  Psychiatric:        Mood and Affect: Mood and affect normal.        Behavior: Behavior normal.        Thought Content: Thought content normal.        Judgment: Judgment normal.      No flowsheet data found.     Assessment & Plan:    Patient Active Problem List   Diagnosis Date Noted  . MDD (major depressive disorder), recurrent episode, severe (HCC) 10/16/2020  . Vision changes 07/09/2020  . Weight loss 07/09/2020  . Chronic hepatitis C (HCC) 04/27/2020  . Idiopathic peripheral neuropathy 04/27/2020  . Herpes simplex 04/27/2020  . Herpes zoster 04/27/2020  . Healthcare maintenance 12/19/2019  . 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 Post-traumatic stress disorder, unspecified 11/28/2019   . 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 Diarrhea   . Anxiety state   . Severe recurrent major depression without psychotic features (HCC) 07/13/2017  . Major depressive disorder, recurrent, severe without psychotic behavior (HCC) 07/13/2017  . MDD (major depressive disorder), recurrent, severe, with psychosis (HCC) 07/31/2014  . Benzodiazepine misuse 07/31/2014  . Human immunodeficiency virus infection (HCC)   . Severe major depression (HCC) 07/30/2014  . Delusional disorder (HCC) 07/30/2014  . Suicidal thoughts 07/30/2014  . Hallucinations   . Major depressive disorder, recurrent episode (HCC) 10/03/2013  . Anxiety, generalized 03/26/2013  . Generalized anxiety disorder 03/26/2013     Problem  List Items Addressed This Visit      Other   MDD (major depressive disorder), recurrent, severe, with psychosis (HCC) - Primary    Tyler Horn was recently released from behavioral health this morning following inpatient treatment.  Feeling okay thus far.  Postponed counseling appointment for today and has appointment with new psychiatrist in a week.  Reviewed medications and will continue to monitor.      Human immunodeficiency virus infection [facility])    Tyler Horn generally has well-controlled HIV disease with good adherence and tolerance to his ART regimen of Biktarvy.  No signs/symptoms of opportunistic infection or progressive HIV disease.  Reviewed previous lab work and discussed plan of care.  Check blood work today.  Continue current dose of Biktarvy.  Plan for follow-up in 2 months or sooner if needed.       Relevant Medications   bictegravir-emtricitabine-tenofovir AF (BIKTARVY) 50-200-25 MG TABS tablet   Other Relevant Orders   COMPLETE METABOLIC PANEL WITH GFR   HIV-1 RNA quant-no reflex-bld   T-helper cell (CD4)- ([facility] clinic only)   Healthcare maintenance     Discussed importance of safe sexual practice to  reduce risk of STI.  Condoms declined.  Vaccines declined today.  Due for routine dental care and will refer to [facility].           I am having Paiden Shortridge maintain his Lactobacillus (PROBIOTIC ACIDOPHILUS PO), psyllium, ibuprofen, cholecalciferol, Fish Oil, cephALEXin, busPIRone, escitalopram, hydrOXYzine, QUEtiapine, tamsulosin, and Biktarvy.   Meds ordered this encounter  Medications  . bictegravir-emtricitabine-tenofovir AF (BIKTARVY) 50-200-25 MG TABS tablet    Sig: Take 1 tablet by mouth daily.    Dispense:  30 tablet    Refill:  5    Order Specific Question:   Supervising Provider    Answer:   Judyann Munson [[id number]]     Follow-up: Return in about 2 months (around 12/21/2020), or if symptoms worsen or fail to improve.   Marcos Eke, MSN, FNP-C Nurse Practitioner [facility] Main number: [phone]

## 2020-10-23 NOTE — Discharge Summary (Signed)
Physician Discharge Summary Note  Patient:  Tyler Horn is an 60 y.o., male MRN:  [id number] DOB:  [birth year] Patient phone:  [phone] (home)  Patient address:   [address] [place] [address],  Total Time spent with patient: 30 minutes  Date of Admission:  10/16/2020  Date of Discharge: 10/23/20  Reason for Admission: Worsening symptoms of depression triggering suicidal ideations.   Principal Problem: MDD (major depressive disorder), recurrent, severe, with psychosis (HCC)  Discharge Diagnoses: Principal Problem:   MDD (major depressive disorder), recurrent, severe, with psychosis (HCC) Active Problems:   MDD (major depressive disorder), recurrent episode, severe (HCC)  Past Psychiatric History: Multiple prior hospitalizations as well as visits to the emergency rooms.                                              Major depressive disorder, recurrent episodes.  Past Medical History:  Past Medical History:  Diagnosis Date  . Anxiety   . Depression   . HIV (human immunodeficiency virus infection) (HCC)   . HIV (human immunodeficiency virus infection) (HCC)     Past Surgical History:  Procedure Laterality Date  . CATARACT EXTRACTION Left    Family History:  Family History  Problem Relation Age of Onset  . Mental illness Mother   . Bipolar disorder Brother    Family Psychiatric  History: See H&P  Social History:  Social History   Substance and Sexual Activity  Alcohol Use No     Social History   Substance and Sexual Activity  Drug Use No    Social History   Socioeconomic History  . Marital status: Single    Spouse name: Not on file  . Number of children: Not on file  . Years of education: Not on file  . Highest education level: Not on file  Occupational History  . Not on file  Tobacco Use  . Smoking status: Never Smoker  . Smokeless tobacco: Never Used  Substance and Sexual Activity  . Alcohol use: No  . Drug use: No  . Sexual  activity: Yes  Other Topics Concern  . Not on file  Social History Narrative  . Not on file   Social Determinants of Health   Financial Resource Strain: Not on file  Food Insecurity: Not on file  Transportation Needs: Not on file  Physical Activity: Not on file  Stress: Not on file  Social Connections: Not on file   Hospital Course: (Per Md's admission evaluation notes): This is one of several admission assessments in the [facility] system for this 60 year old Caucasian male with hx of Major depressive disorder. He has been a patient in this [facility] x numerous times with similar presentations. He is being admitted this time around from the [facility] with complaint of worsening symptoms of depression triggering severe anxiety & suicidal ideations with plans to cut himself with a kitchen knife. Chart review indicated that patient may not have been on his mental health medications x 4 weeks due to lack of medications after losing his job & health insurance. He was complaining of auditory hallucinations, worsening insomnia & urine retention. He is admitted for mood stabilization treatments. This patient is deaf, communicates using sign language. This assessment was conducted using a sign language interpreters Evangeline Dakin). During this assessment [name] reports via the language interpreter, "  I was having bad depression & anxiety causing me to think of hurting myself. I have been out of my mental health medications x 4 months. My primary psychiatric provider went away. I have been on medications x many years. I also recently retired after working for 32 years in my job. After the semi-retirement, my health insurance changed. I was unable to re-fill my medications & with my psychiatrist moving away, I felt stranded, did not know what to do. Then, about a week without my medicines, the depression & anxiety returned. Then, the symptoms worsened in the last 2 weeks. I was re-started on my Seroquel  200 mg & Lexapro 20 mg 2 days ago. I guess the medicines needs time to get in my system because I'm still not feeling right. I was doing well on my medicines until my insurance changed. I'm feeling very anxious right now. I'm unable to pee or empty my bladder because of my anxiety. I have problem having a bowel movement as well because the anxiety freezes my whole body up. I'm also having bad back spasm. I can't stay still. I feel restless, anxious & fidgety. I was hearing voices 2 days ago as well. I don't sleep well at night. I need help". Rates depression #8 & anxiety#9.   This is one of several psychiatric discharge summaries from the [facility] systems for this 60 year old Caucasian male with hx of chronic mental illness & multiple psychiatric admissions. He is known in this [facility] from his previous admissions for worsening symptoms of Major depressive disorder, recurrent, severe with psychosis. He has been tried on multiple psychotropic medications for his symptoms. This time around, he says he has been compliant with his treatment regimen only that he ran out of his medications due to losing his primary psychiatric care provider due to change in his insurance status. He reported on admission that he was forced to an early retirement from his job & that led to him losing his health insurance. He was brought to the [facility] this time around for evaluation & treatment for worsening depression with psychosis triggering suicidal ideations. [name] is hearing impaired & all his communication while hospitalized here were conducted using a sign language interpreter.   After evaluation of his presenting symptoms this time around, Arben was recommended for mood stabilization treatments. The medication regimen for his presenting symptoms were discussed & with his consent initiated. He received, stabilized & was discharged on the medications as listed below on his discharge medication lists. He was also enrolled &  participated in the group counseling sessions being offered & held on this unit. He did choose the group sessions that he wants to attend. He learned coping skills. He presented on this admission, other chronic medical conditions that required treatment & monitoring. He was resumed/discharged on all his pertinent home medications for those health issues. He tolerated his treatment regimen without any adverse effects or reactions reported. His symptoms did respond adequately to his treatment regimen warranting this discharge.  During the course of his hospitalization, the 15-minute checks were adequate to ensure Eisen's safety. Patient did not display any dangerous, violent or suicidal behavior on the unit. He interacted with staff appropriately. He participated appropriately in the group sessions/therapies. His medications were addressed & adjusted to meet his needs. He was recommended for outpatient follow-up care & medication management upon discharge to assure his continuity of care.  At the time of discharge, patient is not reporting any acute suicidal/homicidal  ideations. He feels more confident about his self-care & in managing his symptoms. He currently denies any new issues or concerns. Education and supportive counseling provided throughout her hospital stay & upon discharge.  Today upon his discharge evaluation with the attending psychiatrist, Bryan shares he is doing well. He denies any other specific concerns. He is sleeping well. His appetite is good. He denies other physical complaints. He denies AH/VH, delusional thoughts or paranoia. He did not appear to be responding to any internal stimuli. He feels that his medications have been helpful & is in agreement to continue his current treatment regimen as recommended. He was able to engage in safety planning including plan to return to [facility] or contact emergency services if he feels unable to maintain his own safety or the safety of others. Pt had no  further questions, comments, or concerns. He left [facility] with all personal belongings in no apparent distress to attend his scheduled medical doctor's appointment. Transportation per the [employer]..  Physical Findings: AIMS: Facial and Oral Movements Muscles of Facial Expression: None, normal Lips and Perioral Area: None, normal Jaw: None, normal Tongue: None, normal,Extremity Movements Upper (arms, wrists, hands, fingers): None, normal Lower (legs, knees, ankles, toes): None, normal, Trunk Movements Neck, shoulders, hips: None, normal, Overall Severity Severity of abnormal movements (highest score from questions above): None, normal Incapacitation due to abnormal movements: None, normal Patient's awareness of abnormal movements (rate only patient's report): No Awareness, Dental Status Current problems with teeth and/or dentures?: No Does patient usually wear dentures?: No  CIWA:  CIWA-Ar Total: 3 COWS:  COWS Total Score: 3  Musculoskeletal: Strength & Muscle Tone: within normal limits Gait & Station: normal Patient leans: N/A  Psychiatric Specialty Exam: Physical Exam Vitals and nursing note reviewed.  Constitutional:      Appearance: He is well-developed.  HENT:     Head: Normocephalic.     Comments: Patient is deaf. Uses sign language to communicate.    Nose: Nose normal.     Mouth/Throat:     Pharynx: Oropharynx is clear.  Eyes:     Pupils: Pupils are equal, round, and reactive to light.  Cardiovascular:     Rate and Rhythm: Normal rate.     Pulses: Normal pulses.  Pulmonary:     Effort: Pulmonary effort is normal.  Genitourinary:    Comments: Deferred. Hx. Urinary retention. Musculoskeletal:        General: Normal range of motion.     Cervical back: Normal range of motion.  Skin:    General: Skin is warm and dry.  Neurological:     General: No focal deficit present.     Mental Status: He is alert and oriented to person, place, and time.      Review of Systems  Constitutional: Negative for chills, diaphoresis and fever.  HENT: Negative for congestion, rhinorrhea, sneezing and sore throat.   Eyes: Negative.  Negative for discharge.  Respiratory: Negative for cough, shortness of breath and wheezing.   Cardiovascular: Negative for chest pain and palpitations.  Gastrointestinal: Negative for diarrhea, nausea and vomiting.  Endocrine: Negative for cold intolerance.  Genitourinary: Positive for difficulty urinating.       Hx. Urinary retention.  Musculoskeletal: Negative for arthralgias and myalgias.  Skin: Negative.   Allergic/Immunologic: Negative for environmental allergies and food allergies.       Allergies: NKDA  Neurological: Negative.  Negative for dizziness, tremors, seizures, syncope, facial asymmetry, speech difficulty, weakness, light-headedness, numbness and headaches.  Psychiatric/Behavioral: Positive for hallucinations (Stabilized with medication prior to discharge) and sleep disturbance (Stabilized with medication prior to discharge ). Negative for agitation, behavioral problems, confusion, decreased concentration, dysphoric mood, self-injury and suicidal ideas (Hx of & attempt). The patient is not nervous/anxious (Stable upon discharge) and is not hyperactive.     Blood pressure 100/61, pulse (!) 102, temperature (!) 97.5 F (36.4 C), temperature source Oral, resp. rate 18, height 6\' 4"  (1.93 m), weight 89.8 kg, SpO2 98 %.Body mass index is 24.1 kg/m.   General Appearance: See Md's discharge SRA  Sleep: 8 hours   Have you used any form of tobacco in the last 30 days? (Cigarettes, Smokeless Tobacco, Cigars, and/or Pipes): No  Has this patient used any form of tobacco in the last 30 days? (Cigarettes, Smokeless Tobacco, Cigars, and/or Pipes): N/A  Blood Alcohol level:  Lab Results  Component Value Date   ETH <10 11/26/2019   ETH <10 10/26/2019   Metabolic Disorder Labs:  Lab Results  Component Value Date    HGBA1C 5.1 10/18/2020   MPG 99.67 10/18/2020   MPG 99.67 07/15/2017   No results found for: PROLACTIN Lab Results  Component Value Date   CHOL 142 10/18/2020   TRIG 52 10/18/2020   HDL 39 (L) 10/18/2020   CHOLHDL 3.6 10/18/2020   VLDL 10 10/18/2020   LDLCALC 93 10/18/2020   LDLCALC 123 (H) 11/28/2019   See Psychiatric Specialty Exam and Suicide Risk Assessment completed by Attending Physician prior to discharge.  Discharge destination:  Home  Is patient on multiple antipsychotic therapies at discharge:  No   Has Patient had three or more failed trials of antipsychotic monotherapy by history:  No  Recommended Plan for Multiple Antipsychotic Therapies: NA  Discharge Instructions    Diet - low sodium heart healthy   Complete by: As directed    Increase activity slowly   Complete by: As directed      Allergies as of 10/23/2020   No Known Allergies     Medication List    STOP taking these medications   LORazepam 0.5 MG tablet Commonly known as: Ativan   prazosin 1 MG capsule Commonly known as: MINIPRESS     TAKE these medications     Indication  Biktarvy 50-200-25 MG Tabs tablet Generic drug: bictegravir-emtricitabine-tenofovir AF Take 1 tablet by mouth daily.  Indication: HIV Disease   busPIRone 15 MG tablet Commonly known as: BUSPAR Take 1 tablet (15 mg total) by mouth 3 (three) times daily.  Indication: Anxiety Disorder   cephALEXin 500 MG capsule Commonly known as: KEFLEX Take 1 capsule (500 mg total) by mouth every 8 (eight) hours.  Indication: Infection of Genitals and/or Urinary Tract   cholecalciferol 25 MCG (1000 UNIT) tablet Commonly known as: VITAMIN D3 Take 1,000 Units by mouth daily.  Indication: Vitamin D Deficiency   escitalopram 20 MG tablet Commonly known as: LEXAPRO Take 1 tablet (20 mg total) by mouth daily.  Indication: Generalized Anxiety Disorder, Major Depressive Disorder   Fish Oil 1000 MG Caps Take 1,000 mg by mouth daily.   Indication: Tiredness   hydrOXYzine 25 MG tablet Commonly known as: ATARAX/VISTARIL Take 1 tablet (25 mg total) by mouth 3 (three) times daily as needed for anxiety.  Indication: Feeling Anxious   ibuprofen 100 MG tablet Commonly known as: ADVIL Take 100 mg by mouth every 6 (six) hours as needed for fever.  Indication: Arthritis   PROBIOTIC ACIDOPHILUS PO Take 1 tablet by mouth daily.  Indication: nutrition   psyllium 58.6 % packet Commonly known as: METAMUCIL Take 1 packet by mouth daily.  Indication: constipation   QUEtiapine 100 MG tablet Commonly known as: SEROQUEL Take 1 tablet (100 mg total) by mouth at bedtime. What changed:   medication strength  how much to take  Indication: Major Depressive Disorder, Mood control   tamsulosin 0.4 MG Caps capsule Commonly known as: FLOMAX Take 2 capsules (0.8 mg total) by mouth daily. Start taking on: October 24, 2020  Indication: Dysfunction of the Urinary Bladder       Follow-up Information    [facility]. Go on 10/29/2020.   Specialty: Behavioral Health Why: You  have an appointment on 10/29/20 at 1:00 pm for medication management services.  You may also schedule an appointment for therapy services with this provider. Contact information: [address] [place] [address] [phone]       [facility] Go to.   Specialty: Professional Counselor Why: As you have not seen this provider in the last 90 days, you will need to re-establish care for therapy services.  Please go during the walk in hours of Monday through Friday, 8:30 am to 12:00 pm and 1:00 pm to 2:30 pm.  Contact information: [address] [place] [address] [phone]              Follow-up recommendations: Activity:  As tolerated Diet: As recommended by your primary care doctor. Keep all scheduled follow-up appointments as recommended.   Comments:  Patient is  instructed prior to discharge to: Take all medications as prescribed by his/her mental healthcare provider. Report any adverse effects and or reactions from the medicines to his/her outpatient provider promptly. Patient has been instructed & cautioned: To not engage in alcohol and or illegal drug use while on prescription medicines. In the event of worsening symptoms, patient is instructed to call the crisis hotline, 911 and or go to the nearest ED for appropriate evaluation and treatment of symptoms. To follow-up with his/her primary care provider for your other medical issues, concerns and or health care needs.   Signed: Armandina Stammer, NP, PMHNP, FNP-BC 10/23/2020, 9:37 AM

## 2020-10-23 NOTE — Assessment & Plan Note (Signed)
Mr. Madole was recently released from behavioral health this morning following inpatient treatment.  Feeling okay thus far.  Postponed counseling appointment for today and has appointment with new psychiatrist in a week.  Reviewed medications and will continue to monitor.

## 2020-10-23 NOTE — Progress Notes (Signed)
  [facility] Adult Case Management Discharge Plan :  Will you be returning to the same living situation after discharge:  Yes,  Doctors appointment then home At discharge, do you have transportation home?: Yes,  Safe Transport  Do you have the ability to pay for your medications: Yes,  Family  Release of information consent forms completed and in the chart;  Patient's signature needed at discharge.  Patient to Follow up at:  Follow-up Information    [facility]. Go on 10/29/2020.   Specialty: Behavioral Health Why: You  have an appointment on 10/29/20 at 1:00 pm for medication management services.  You may also schedule an appointment for therapy services with this provider. Contact information: [address] [place] [address] [phone]       [place] Go to.   Specialty: Professional Counselor Why: As you have not seen this provider in the last 90 days, you will need to re-establish care for therapy services.  Please go during the walk in hours of Monday through Friday, 8:30 am to 12:00 pm and 1:00 pm to 2:30 pm.  Contact information: [address] [place] [address] [phone]               Next level of care provider has access to [facility] Link:yes  Safety Planning and Suicide Prevention discussed: Yes,  with mother and patient   Have you used any form of tobacco in the last 30 days? (Cigarettes, Smokeless Tobacco, Cigars, and/or Pipes): No  Has patient been referred to the Quitline?: N/A patient is not a smoker  Patient has been referred for addiction treatment: N/A  Aram Beecham, LCSWA 10/23/2020, 8:41 AM

## 2020-10-23 NOTE — Assessment & Plan Note (Deleted)
Tyler Horn generally has well-controlled HIV disease with good adherence and tolerance to his ART regimen of Biktarvy.  No signs/symptoms of opportunistic infection or progressive HIV disease.  Reviewed previous lab work and discussed plan of care.  Check blood work today.  Continue current dose of Biktarvy.  Plan for follow-up in 2 months or sooner if needed.

## 2020-10-23 NOTE — Assessment & Plan Note (Signed)
Tyler Horn generally has well-controlled HIV disease with good adherence and tolerance to his ART regimen of Biktarvy.  No signs/symptoms of opportunistic infection or progressive HIV disease.  Reviewed previous lab work and discussed plan of care.  Check blood work today.  Continue current dose of Biktarvy.  Plan for follow-up in 2 months or sooner if needed. 

## 2020-10-23 NOTE — Patient Instructions (Signed)
Nice to see you.   We will check your lab work today.  Continue to take your [place] daily as prescribed.   Refills are at the pharmacy.  Plan for follow up in 3 months or sooner if needed with lab work 1-2 weeks prior to appointment or on same day.  Glad you are feeling better.  Have a great day and stay safe!

## 2020-10-23 NOTE — Progress Notes (Signed)
   10/23/20 0624  Vital Signs  Temp (!) 97.5 F (36.4 C)  Temp Source Oral  Pulse Rate 89  BP (!) 119/103  BP Location Left Arm  BP Method Automatic  Patient Position (if appropriate) Sitting   D: Patient denies SI/HI/AVH. Pt. Rated anxiety 5/10 and depression 2/10. Pt. Is up and pacing the halls, and reported he is ready to go home. A:  Patient took scheduled medicine.  Support and encouragement provided Routine safety checks conducted every 15 minutes. Patient  Informed to notify staff with any concerns.   R: Safety maintained.

## 2020-10-23 NOTE — Assessment & Plan Note (Signed)
   Discussed importance of safe sexual practice to reduce risk of STI.  Condoms declined.  Vaccines declined today.  Due for routine dental care and will refer to [facility] dental clinic.

## 2020-10-23 NOTE — BHH Suicide Risk Assessment (Signed)
[facility] Discharge Suicide Risk Assessment   Principal Problem: MDD (major depressive disorder), recurrent, severe, with psychosis (HCC) Discharge Diagnoses: Principal Problem:   MDD (major depressive disorder), recurrent, severe, with psychosis (HCC) Active Problems:   MDD (major depressive disorder), recurrent episode, severe (HCC)   Total Time spent with patient: 20 minutes  Musculoskeletal: Strength & Muscle Tone: within normal limits Gait & Station: normal Patient leans: N/A  Psychiatric Specialty Exam: Review of Systems  HENT: Positive for hearing loss.   Genitourinary: Positive for difficulty urinating and dysuria.    Blood pressure 100/61, pulse (!) 102, temperature (!) 97.5 F (36.4 C), temperature source Oral, resp. rate 18, height 6\' 4"  (1.93 m), weight 89.8 kg, SpO2 98 %.Body mass index is 24.1 kg/m.  General Appearance: Casual  Eye Contact::  Good  Speech:  Patient is hearing impaired409  Volume:  Patient is hearing impaired  Mood:  Anxious  Affect:  Congruent  Thought Process:  Coherent and Descriptions of Associations: Intact  Orientation:  Full (Time, Place, and Person)  Thought Content:  Logical  Suicidal Thoughts:  No  Homicidal Thoughts:  No  Memory:  Immediate;   Good Recent;   Good Remote;   Good  Judgement:  Intact  Insight:  Fair  Psychomotor Activity:  Normal  Concentration:  Fair  Recall:  Good  Fund of Knowledge:Good  Language: Patient is hearing impaired  Akathisia:  Negative  Handed:  Right  AIMS (if indicated):     Assets:  Desire for Improvement Housing Resilience Social Support  Sleep:  Number of Hours: 6.75  Cognition: WNL  ADL's:  Intact   Mental Status Per Nursing Assessment::   On Admission:  Suicidal ideation indicated by patient  Demographic Factors:  Male, Caucasian, Gay, lesbian, or bisexual orientation and Living alone  Loss Factors: NA  Historical Factors: Impulsivity  Risk Reduction Factors:   Positive social  support and Positive coping skills or problem solving skills  Continued Clinical Symptoms:  Severe Anxiety and/or Agitation Depression:   Impulsivity  Cognitive Features That Contribute To Risk:  None    Suicide Risk:  Minimal: No identifiable suicidal ideation.  Patients presenting with no risk factors but with morbid ruminations; may be classified as minimal risk based on the severity of the depressive symptoms   Follow-up Information    [facility]. Go on 10/29/2020.   Specialty: Behavioral Health Why: You  have an appointment on 10/29/20 at 1:00 pm for medication management services.  You may also schedule an appointment for therapy services with this provider. Contact information: [address] [place] [phone]       [place] Go to.   Specialty: Professional Counselor Why: As you have not seen this provider in the last 90 days, you will need to re-establish care for therapy services.  Please go during the walk in hours of Monday through Friday, 8:30 am to 12:00 pm and 1:00 pm to 2:30 pm.  Contact information: [address] [place] [phone]               Plan Of Care/Follow-up recommendations:  Activity:  ad lib  [phone], MD 10/23/2020, 7:52 AM

## 2020-10-24 LAB — T-HELPER CELL (CD4) - (RCID CLINIC ONLY)
CD4 % Helper T Cell: 34 % (ref 33–65)
CD4 T Cell Abs: 501 /uL (ref 400–1790)

## 2020-10-25 LAB — COMPLETE METABOLIC PANEL WITH GFR
AG Ratio: 1.3 (calc) (ref 1.0–2.5)
ALT: 19 U/L (ref 9–46)
AST: 17 U/L (ref 10–35)
Albumin: 4.3 g/dL (ref 3.6–5.1)
Alkaline phosphatase (APISO): 76 U/L (ref 35–144)
BUN/Creatinine Ratio: 28 (calc) — ABNORMAL HIGH (ref 6–22)
BUN: 31 mg/dL — ABNORMAL HIGH (ref 7–25)
CO2: 25 mmol/L (ref 20–32)
Calcium: 9.9 mg/dL (ref 8.6–10.3)
Chloride: 105 mmol/L (ref 98–110)
Creat: 1.11 mg/dL (ref 0.70–1.33)
GFR, Est African American: 84 mL/min/{1.73_m2} (ref >=60)
GFR, Est Non African American: 72 mL/min/{1.73_m2} (ref >=60)
Globulin: 3.2 g/dL (calc) (ref 1.9–3.7)
Glucose, Bld: 94 mg/dL (ref 65–99)
Potassium: 4.6 mmol/L (ref 3.5–5.3)
Sodium: 139 mmol/L (ref 135–146)
Total Bilirubin: 0.4 mg/dL (ref 0.2–1.2)
Total Protein: 7.5 g/dL (ref 6.1–8.1)

## 2020-10-25 LAB — HIV-1 RNA QUANT-NO REFLEX-BLD
HIV 1 RNA Quant: 36 Copies/mL — ABNORMAL HIGH
HIV-1 RNA Quant, Log: 1.55 Log cps/mL — ABNORMAL HIGH

## 2020-10-28 ENCOUNTER — Ambulatory Visit: Payer: Self-pay

## 2020-10-29 ENCOUNTER — Other Ambulatory Visit: Payer: Self-pay

## 2020-10-29 ENCOUNTER — Telehealth (HOSPITAL_COMMUNITY): Payer: No Payment, Other | Admitting: Psychiatry

## 2020-11-03 ENCOUNTER — Encounter (HOSPITAL_COMMUNITY): Payer: Self-pay

## 2020-11-03 ENCOUNTER — Emergency Department (HOSPITAL_COMMUNITY)
Admission: EM | Admit: 2020-11-03 | Discharge: 2020-11-04 | Disposition: A | Discharge status: Home / Self Care | Payer: Self-pay | Attending: Emergency Medicine | Admitting: Emergency Medicine

## 2020-11-03 DIAGNOSIS — Z79899 Other long term (current) drug therapy: Secondary | ICD-10-CM | POA: Insufficient documentation

## 2020-11-03 DIAGNOSIS — Z21 Asymptomatic human immunodeficiency virus [HIV] infection status: Secondary | ICD-10-CM | POA: Insufficient documentation

## 2020-11-03 DIAGNOSIS — R45851 Suicidal ideations: Secondary | ICD-10-CM

## 2020-11-03 DIAGNOSIS — F410 Panic disorder [episodic paroxysmal anxiety] without agoraphobia: Secondary | ICD-10-CM | POA: Diagnosis present

## 2020-11-03 DIAGNOSIS — F1390 Sedative, hypnotic, or anxiolytic use, unspecified, uncomplicated: Secondary | ICD-10-CM | POA: Diagnosis present

## 2020-11-03 DIAGNOSIS — F333 Major depressive disorder, recurrent, severe with psychotic symptoms: Secondary | ICD-10-CM | POA: Diagnosis present

## 2020-11-03 DIAGNOSIS — Z20822 Contact with and (suspected) exposure to covid-19: Secondary | ICD-10-CM | POA: Insufficient documentation

## 2020-11-03 DIAGNOSIS — F419 Anxiety disorder, unspecified: Secondary | ICD-10-CM | POA: Insufficient documentation

## 2020-11-03 DIAGNOSIS — R339 Retention of urine, unspecified: Secondary | ICD-10-CM | POA: Insufficient documentation

## 2020-11-03 DIAGNOSIS — F411 Generalized anxiety disorder: Secondary | ICD-10-CM | POA: Diagnosis present

## 2020-11-03 LAB — RAPID URINE DRUG SCREEN, HOSP PERFORMED
Amphetamines: NOT DETECTED
Barbiturates: NOT DETECTED
Benzodiazepines: NOT DETECTED
Cocaine: NOT DETECTED
Opiates: NOT DETECTED
Tetrahydrocannabinol: NOT DETECTED

## 2020-11-03 LAB — CBC WITH DIFFERENTIAL/PLATELET
Abs Immature Granulocytes: 0.02 10*3/uL (ref 0.00–0.07)
Basophils Absolute: 0.1 10*3/uL (ref 0.0–0.1)
Basophils Relative: 1 %
Eosinophils Absolute: 0.1 10*3/uL (ref 0.0–0.5)
Eosinophils Relative: 1 %
HCT: 42.4 % (ref 39.0–52.0)
Hemoglobin: 14.7 g/dL (ref 13.0–17.0)
Immature Granulocytes: 0 %
Lymphocytes Relative: 28 %
Lymphs Abs: 2.5 10*3/uL (ref 0.7–4.0)
MCH: 31.1 pg (ref 26.0–34.0)
MCHC: 34.7 g/dL (ref 30.0–36.0)
MCV: 89.8 fL (ref 80.0–100.0)
Monocytes Absolute: 0.7 10*3/uL (ref 0.1–1.0)
Monocytes Relative: 8 %
Neutro Abs: 5.6 10*3/uL (ref 1.7–7.7)
Neutrophils Relative %: 62 %
Platelets: 328 10*3/uL (ref 150–400)
RBC: 4.72 MIL/uL (ref 4.22–5.81)
RDW: 13.2 % (ref 11.5–15.5)
WBC: 9 10*3/uL (ref 4.0–10.5)
nRBC: 0 % (ref 0.0–0.2)

## 2020-11-03 LAB — URINALYSIS, ROUTINE W REFLEX MICROSCOPIC
Bacteria, UA: NONE SEEN
Bilirubin Urine: NEGATIVE
Glucose, UA: NEGATIVE mg/dL
Ketones, ur: NEGATIVE mg/dL
Leukocytes,Ua: NEGATIVE
Nitrite: NEGATIVE
Protein, ur: NEGATIVE mg/dL
Specific Gravity, Urine: 1.025 (ref 1.005–1.030)
pH: 5 (ref 5.0–8.0)

## 2020-11-03 LAB — BASIC METABOLIC PANEL
Anion gap: 13 (ref 5–15)
BUN: 25 mg/dL — ABNORMAL HIGH (ref 6–20)
CO2: 24 mmol/L (ref 22–32)
Calcium: 9.4 mg/dL (ref 8.9–10.3)
Chloride: 110 mmol/L (ref 98–111)
Creatinine, Ser: 0.92 mg/dL (ref 0.61–1.24)
GFR, Estimated: >60 mL/min (ref >60)
Glucose, Bld: 101 mg/dL — ABNORMAL HIGH (ref 70–99)
Potassium: 3.1 mmol/L — ABNORMAL LOW (ref 3.5–5.1)
Sodium: 147 mmol/L — ABNORMAL HIGH (ref 135–145)

## 2020-11-03 LAB — RESP PANEL BY RT-PCR (FLU A&B, COVID) ARPGX2
Influenza A by PCR: NEGATIVE
Influenza B by PCR: NEGATIVE
SARS Coronavirus 2 by RT PCR: NEGATIVE

## 2020-11-03 MED ORDER — LORAZEPAM 1 MG PO TABS
1.0000 mg | ORAL_TABLET | ORAL | Status: DC | PRN
  Administered 2020-11-03 – 2020-11-04 (×2): 1 mg via ORAL
  Filled 2020-11-03 (×2): qty 1

## 2020-11-03 MED ORDER — LORAZEPAM 2 MG/ML IJ SOLN: 1.0000 mg | Freq: Once | INTRAMUSCULAR | Status: DC

## 2020-11-03 MED ORDER — BICTEGRAVIR-EMTRICITAB-TENOFOV 50-200-25 MG PO TABS
1.0000 | ORAL_TABLET | Freq: Every day | ORAL | Status: DC
  Administered 2020-11-04: 1 via ORAL
  Filled 2020-11-03: qty 1

## 2020-11-03 MED ORDER — BUSPIRONE HCL 10 MG PO TABS
15.0000 mg | ORAL_TABLET | Freq: Three times a day (TID) | ORAL | Status: DC
  Administered 2020-11-03 – 2020-11-04 (×4): 15 mg via ORAL
  Filled 2020-11-03 (×4): qty 2

## 2020-11-03 MED ORDER — HYDROXYZINE HCL 25 MG PO TABS: 25.0000 mg | ORAL_TABLET | Freq: Three times a day (TID) | ORAL | Status: DC | PRN

## 2020-11-03 MED ORDER — TAMSULOSIN HCL 0.4 MG PO CAPS
0.8000 mg | ORAL_CAPSULE | Freq: Every day | ORAL | Status: DC
Start: 2020-11-03 — End: 2020-11-04
  Administered 2020-11-03 – 2020-11-04 (×2): 0.8 mg via ORAL
  Filled 2020-11-03 (×2): qty 2

## 2020-11-03 MED ORDER — QUETIAPINE FUMARATE 100 MG PO TABS
100.0000 mg | ORAL_TABLET | Freq: Every day | ORAL | Status: DC
  Administered 2020-11-03: 100 mg via ORAL
  Filled 2020-11-03: qty 1

## 2020-11-03 NOTE — ED Provider Notes (Signed)
[facility]-EMERGENCY DEPT Provider Note   CSN: [id number] Arrival date & time: 11/03/20  1245     History Chief Complaint  Patient presents with  . Urinary Retention    Tyler Horn is a 60 y.o. male.  Pt presents to the ED today with urinary retention.  He said he's had a difficult time emptying his bladder for the past 4 weeks.  Pt is deaf and uses ASL.  Interpreter is present during H&P.  Pt is extremely anxious.  He is a difficult historian due to the anxiety.  He said he has not slept in 4 days.        Past Medical History:  Diagnosis Date  . Anxiety   . Depression   . HIV (human immunodeficiency virus infection) (HCC)   . HIV (human immunodeficiency virus infection) [facility])     Patient Active Problem List   Diagnosis Date Noted  . MDD (major depressive disorder), recurrent episode, severe (HCC) 10/16/2020  . Vision changes 07/09/2020  . Weight loss 07/09/2020  . Chronic hepatitis C (HCC) 04/27/2020  . Idiopathic peripheral neuropathy 04/27/2020  . Herpes simplex 04/27/2020  . Herpes zoster 04/27/2020  . Healthcare maintenance 12/19/2019  . 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 Post-traumatic stress disorder, unspecified 11/28/2019  . 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 Diarrhea   . Anxiety state   . Severe recurrent major depression without psychotic features (HCC) 07/13/2017  . Major depressive disorder, recurrent, severe without psychotic behavior (HCC) 07/13/2017  . MDD (major depressive disorder), recurrent, severe, with psychosis (HCC) 07/31/2014  . Benzodiazepine misuse 07/31/2014  . Human immunodeficiency virus infection (HCC)   . Severe major depression (HCC) 07/30/2014  . Delusional disorder (HCC) 07/30/2014  . Suicidal thoughts 07/30/2014  . Hallucinations   . Major depressive disorder, recurrent episode (HCC) 10/03/2013  . Anxiety, generalized 03/26/2013   . Generalized anxiety disorder 03/26/2013    Past Surgical History:  Procedure Laterality Date  . CATARACT EXTRACTION Left        Family History  Problem Relation Age of Onset  . Mental illness Mother   . Bipolar disorder Brother     Social History   Tobacco Use  . Smoking status: Never Smoker  . Smokeless tobacco: Never Used  Substance Use Topics  . Alcohol use: No  . Drug use: No    Home Medications Prior to Admission medications   Medication Sig Start Date End Date Taking? Authorizing Provider  bictegravir-emtricitabine-tenofovir AF (BIKTARVY) 50-200-25 MG TABS tablet Take 1 tablet by mouth daily. 10/23/20   Veryl Speak, FNP  busPIRone (BUSPAR) 15 MG tablet Take 1 tablet (15 mg total) by mouth 3 (three) times daily. 10/23/20   Antonieta Pert, MD  cephALEXin (KEFLEX) 500 MG capsule Take 1 capsule (500 mg total) by mouth every 8 (eight) hours. 10/23/20   Antonieta Pert, MD  cholecalciferol (VITAMIN D3) 25 MCG (1000 UNIT) tablet Take 1,000 Units by mouth daily. Patient not taking: Reported on 10/23/2020    [provider]  escitalopram (LEXAPRO) 20 MG tablet Take 1 tablet (20 mg total) by mouth daily. Patient not taking: Reported on 10/23/2020 10/23/20   Antonieta Pert, MD  hydrOXYzine (ATARAX/VISTARIL) 25 MG tablet Take 1 tablet (25 mg total) by mouth 3 (three) times daily as needed for anxiety. 10/23/20   Antonieta Pert, MD  ibuprofen (ADVIL) 100 MG tablet Take 100 mg by mouth every 6 (six)  hours as needed for fever.    [provider]  Lactobacillus (PROBIOTIC ACIDOPHILUS PO) Take 1 tablet by mouth daily. Patient not taking: Reported on 10/23/2020    [provider]  Omega-3 Fatty Acids (FISH OIL) 1000 MG CAPS Take 1,000 mg by mouth daily. Patient not taking: Reported on 10/23/2020    [provider]  psyllium (METAMUCIL) 58.6 % packet Take 1 packet by mouth daily. Patient not taking: Reported on 10/23/2020    [provider]  QUEtiapine (SEROQUEL) 100 MG tablet Take 1 tablet (100 mg total) by mouth at bedtime. 10/23/20   Antonieta Pert, MD  tamsulosin (FLOMAX) 0.4 MG CAPS capsule Take 2 capsules (0.8 mg total) by mouth daily. 10/24/20   Antonieta Pert, MD    Allergies    Patient has no known allergies.  Review of Systems   Review of Systems  Genitourinary: Positive for difficulty urinating.  Psychiatric/Behavioral: The patient is nervous/anxious.   All other systems reviewed and are negative.   Physical Exam Updated Vital Signs BP 136/76   Pulse 77   Temp 98.7 F (37.1 C) (Oral)   Resp 16   SpO2 96%   Physical Exam Vitals and nursing note reviewed.  Constitutional:      General: He is in acute distress.     Appearance: Normal appearance.  HENT:     Head: Normocephalic and atraumatic.     Right Ear: External ear normal.     Left Ear: External ear normal.     Nose: Nose normal.     Mouth/Throat:     Mouth: Mucous membranes are moist.     Pharynx: Oropharynx is clear.  Eyes:     Extraocular Movements: Extraocular movements intact.     Conjunctiva/sclera: Conjunctivae normal.     Pupils: Pupils are equal, round, and reactive to light.  Cardiovascular:     Rate and Rhythm: Regular rhythm. Tachycardia present.     Pulses: Normal pulses.     Heart sounds: Normal heart sounds.  Pulmonary:     Effort: Pulmonary effort is normal.     Breath sounds: Normal breath sounds.  Abdominal:     General: Abdomen is flat. Bowel sounds are normal.     Palpations: Abdomen is soft.  Musculoskeletal:        General: Normal range of motion.     Cervical back: Normal range of motion and neck supple.  Skin:    General: Skin is warm.     Capillary Refill: Capillary refill takes less than 2 seconds.  Neurological:     General: No focal deficit present.     Mental Status: He is alert and oriented to person, place, and time.  Psychiatric:        Mood and Affect: Mood is anxious.      ED Results / Procedures / Treatments   Labs (all labs ordered are listed, but only abnormal results are displayed) Labs Reviewed  URINALYSIS, ROUTINE W REFLEX MICROSCOPIC - Abnormal; Notable for the following components:      Result Value   Hgb urine dipstick MODERATE (*)    All other components within normal limits  BASIC METABOLIC PANEL - Abnormal; Notable for the following components:   Sodium 147 (*)    Potassium 3.1 (*)    Glucose, Bld 101 (*)    BUN 25 (*)    All other components within normal limits  URINE CULTURE  RESP PANEL BY RT-PCR (FLU A&B, COVID)  ARPGX2  CBC WITH DIFFERENTIAL/PLATELET  RAPID URINE DRUG SCREEN, HOSP PERFORMED    EKG None  Radiology No results found.  Procedures Procedures   Medications Ordered in ED Medications  LORazepam (ATIVAN) injection 1 mg (has no administration in time range)  LORazepam (ATIVAN) tablet 1 mg (has no administration in time range)  bictegravir-emtricitabine-tenofovir AF (BIKTARVY) 50-200-25 MG per tablet 1 tablet (has no administration in time range)  busPIRone (BUSPAR) tablet 15 mg (has no administration in time range)  hydrOXYzine (ATARAX/VISTARIL) tablet 25 mg (has no administration in time range)  QUEtiapine (SEROQUEL) tablet 100 mg (has no administration in time range)  tamsulosin (FLOMAX) capsule 0.8 mg (has no administration in time range)    ED Course  I have reviewed the triage vital signs and the nursing notes.  Pertinent labs & imaging results that were available during my care of the patient were reviewed by me and considered in my medical decision making (see chart for details).    MDM Rules/Calculators/A&P                          We placed a foley because pt was sure he was retaining urine.  Only about 30 cc came out.  Pt urine nl.  Labs nl.  Pt still EXTREMELY anxious.  It is difficult to get the ASL interpreter to understand him because he is shaking so much.  Pt feels like he is not safe to go  home.  He is given ativan and TTS consult ordered.  His foley was removed as he was not retaining.     Final Clinical Impression(s) / ED Diagnoses Final diagnoses:  Anxiety    Rx / DC Orders ED Discharge Orders    None       Jacalyn Lefevre, MD 11/03/20 1557

## 2020-11-03 NOTE — ED Notes (Signed)
Pt calm and cooperative on admission to TCU. Changed into purple scrubs and wanded by security. Obtained Covid swab. Pt given sandwich and drink.

## 2020-11-03 NOTE — ED Triage Notes (Signed)
Pt presents with c/o urinary retention. Pt is severely anxious because he reports urinary issues with emptying his bladder for 4 weeks. Pt's heart rate is elevated as he is very anxious at this time. Pt does require a ASL interpreter.

## 2020-11-04 ENCOUNTER — Other Ambulatory Visit (HOSPITAL_COMMUNITY): Payer: Self-pay | Admitting: Emergency Medicine

## 2020-11-04 ENCOUNTER — Encounter (HOSPITAL_COMMUNITY): Payer: Self-pay | Admitting: Registered Nurse

## 2020-11-04 DIAGNOSIS — R45851 Suicidal ideations: Secondary | ICD-10-CM

## 2020-11-04 DIAGNOSIS — F411 Generalized anxiety disorder: Secondary | ICD-10-CM

## 2020-11-04 MED ORDER — TAMSULOSIN HCL 0.4 MG PO CAPS: 0.4000 mg | ORAL_CAPSULE | Freq: Every day | ORAL | 0 refills | Status: DC

## 2020-11-04 MED ORDER — QUETIAPINE FUMARATE 25 MG PO TABS: 25.0000 mg | ORAL_TABLET | Freq: Two times a day (BID) | ORAL | 1 refills | Status: DC

## 2020-11-04 MED ORDER — QUETIAPINE FUMARATE 50 MG PO TABS
25.0000 mg | ORAL_TABLET | Freq: Two times a day (BID) | ORAL | Status: DC
Start: 2020-11-04 — End: 2020-11-04
  Administered 2020-11-04: 25 mg via ORAL
  Filled 2020-11-04: qty 1

## 2020-11-04 MED FILL — QUETIAPINE FUMARATE 25 MG T: 25 | 15 days supply | Qty: 30 | Fill #0

## 2020-11-04 MED FILL — TAMSULOSIN HCL 0.4 MG CAP: 0.4 | 30 days supply | Qty: 30 | Fill #0

## 2020-11-04 NOTE — ED Notes (Signed)
Pt DC d off unit to home per provider. Pt alert, calm, cooperative, no s/s distress. DC information, medication and resources given to pt. Belongings given to pt. Pt ambulatory off unit, escorted off unit to home. Pt drove home.

## 2020-11-04 NOTE — ED Notes (Signed)
Patient is restless and anxious.  Ambulating and pacing in room to laying on bed and shaking hands

## 2020-11-04 NOTE — BH Assessment (Addendum)
[facility] Assessment Progress Note  Per Shuvon Rankin, NP, this voluntary pt does not require psychiatric hospitalization at this time.  Pt is psychiatrically cleared.  Discharge instructions include referral information for [facility] and for [facility] [place].  [name] Derwood Kaplan, MD and pt's nurse, Beth, have been notified.  Doylene Canning, MA Triage Specialist [phone]   Addendum:  After further consideration Shuvon, in consultation with Nelly Rout, MD, has determined that pt is to be held overnight for further observation and stabilization.  If a bed becomes available at [facility] pt will be transferred there.  Shuvon will notify Dr Rhunette Croft.  I have informed Beth.  Doylene Canning, [place] Behavioral Health Coordinator [phone]   Addendum:  After still further consideration in consultation with Dr Susanne Greenhouse has determined that pt is safe for discharge with medications.  Referrals noted above remain in pt's discharge instructions.  Waynetta Sandy has been notified.  Doylene Canning, [place] Behavioral Health Coordinator [phone]

## 2020-11-04 NOTE — BH Assessment (Signed)
Clinician spoke to nurse Alyssa.  She said that patient was asleep at this time.  He would need to use the interpreter machine and at this time it is being used in another part of the ED.  TTS to see pt when he is alert and oriented.

## 2020-11-04 NOTE — ED Notes (Signed)
Patient received lunch tray 

## 2020-11-04 NOTE — ED Notes (Addendum)
Patient resting on bed 

## 2020-11-04 NOTE — ED Notes (Signed)
Patient resting on bed / appears to be less anxious

## 2020-11-04 NOTE — ED Notes (Signed)
Patient sleeping/rise and fall of chest breathing observed 

## 2020-11-04 NOTE — Progress Notes (Signed)
[facility]  Transition of Care [facility]) - Emergency Department Mini Assessment   Patient Details  Name: Tyler Horn MRN: [id number] Date of Birth: 09-12-1960  [facility]) CM/SW Contact:    Elliot Cousin, RN Phone Number: 11/04/2020, 4:30 PM   Clinical Narrative: TOC CM provided pt meds through [facility] with $0 copay. CM picked up meds from pharmacy and delivered to him in his room. CM Contacted [facility] and confirmed his appt. Pt will be seeing Resident on 11/13/2020. Pt is currently without insurance and unable to arrange earlier appt. Pt will need to keep appt with specialist to receive financial assistance from office with appt. ED provider updated.    ED Mini Assessment: What brought you to the Emergency Department? : urinary issues  Barriers to Discharge: No Barriers Identified,Financial Resources  Barrier interventions: picked up meds from [facility], follow up on Urology appt  Means of departure: Car  Interventions which prevented an admission or readmission: Transportation Screening,Medication Review,Follow-up medical appointment    Patient Contact and Communications  Admission diagnosis:  Urinary Problems Patient Active Problem List   Diagnosis Date Noted  . MDD (major depressive disorder), recurrent episode, severe (HCC) 10/16/2020  . Vision changes 07/09/2020  . Weight loss 07/09/2020  . Chronic hepatitis C (HCC) 04/27/2020  . Idiopathic peripheral neuropathy 04/27/2020  . Herpes simplex 04/27/2020  . Herpes zoster 04/27/2020  . Healthcare maintenance 12/19/2019  . 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 Post-traumatic stress disorder, unspecified 11/28/2019  . 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 Diarrhea   . Anxiety state   . Severe recurrent major depression without psychotic features (HCC) 07/13/2017  . Major depressive disorder, recurrent, severe without  psychotic behavior (HCC) 07/13/2017  . MDD (major depressive disorder), recurrent, severe, with psychosis (HCC) 07/31/2014  . Benzodiazepine misuse 07/31/2014  . Human immunodeficiency virus infection (HCC)   . Severe major depression (HCC) 07/30/2014  . Delusional disorder (HCC) 07/30/2014  . Suicidal thoughts 07/30/2014  . Hallucinations   . Major depressive disorder, recurrent episode (HCC) 10/03/2013  . Anxiety, generalized 03/26/2013  . Generalized anxiety disorder 03/26/2013   PCP:  Veryl Speak, FNP Pharmacy:   [facility] [phone] Ginette Otto, [place] - [address] AT [facility] OF [address] [place] [address] Phone: [phone] Fax: [phone]  [facility] - [place], [place] - [address] [place] [address] [place] [address] Phone: [phone] Fax: [phone]

## 2020-11-04 NOTE — Progress Notes (Signed)
  MATCH Medication Assistance Card  Name: Tyler Horn  ID (MRN): [id number] [facility]: [id number] RX Group: [id number]  Discharge Date:11/04/2020 Expiration Date:11/12/2020  (must be filled within 7 days of discharge)     Dear Tyler Horn:  You have been approved to have the prescriptions written by your discharging physician filled through our [facility] (Medication Assistance Through [facility]) program. This program allows for a one-time (no refills) 34-day supply of selected medications for a low copay amount.  The copay is $0.00 per prescription. For instance, if you have one prescription, you will pay $3.00; for two prescriptions, you pay $0.00; for three prescriptions, you pay $0.00; and so on.  Only certain pharmacies are participating in this program with [facility]. You will need to select one of the pharmacies from the attached list and take your prescriptions, this letter, and your photo ID to one of the participating pharmacies.   We are excited that you are able to use the [facility] program to get your medications. These prescriptions must be filled within 7 days of hospital discharge or they will no longer be valid for the [facility] program. Should you have any problems with your prescriptions please contact your case management team member at [phone] or [phone] for [facility].   Thank you,    [facility]

## 2020-11-04 NOTE — ED Notes (Signed)
Patients anxieties are elevating RN with patient with interpreter [name])

## 2020-11-04 NOTE — Consult Note (Signed)
[facility] Consult   Reason for Consult:  Increased anxiety Referring Physician:  [name] Patient Identification: Tyler PihSteven Propp MRN:  [id number] Principal Diagnosis: Generalized anxiety disorder Diagnosis:  Principal Problem:   Generalized anxiety disorder Active Problems:   Suicidal thoughts   MDD (major depressive disorder), recurrent, severe, with psychosis (HCC)   Benzodiazepine misuse   Total Time spent with patient: 30 minutes  Subjective:   Tyler Horn is a 60 y.o. male patient [facility] with complaints of feeling overwhelmed and helpless related to urinary problems  HPI:  Tyler PihSteven Kiser, 60 y.o., male patient seen via tele health by this provider, consulted with Dr. Lucianne MussKumar; and chart reviewed on 11/04/20.  On evaluation Tyler PihSteven Anstine reports he came to the hospital because he is having problems with urination "It is hard for me to go and when I go it is a weak stream or it just drip, never a regular flow.  I was told to do in/out cath but it was to overwhelming for me to do at home.  I live alone and have no support."  Patient states he is having no suicidal or self harming thoughts just worsened anxiety related to his urinary problems.  States he has an appointment with urologist but it is not until 11/13/20 "and I can't wait that long."  Patient states he took some Ativan this morning and it helped him relax and he did use the bathroom a little.    During evaluation Tyler PihSteven Wierman is laying in bed in no acute distress.  He is alert, oriented x 4, calm and cooperative.  His mood is anxious with congruent affect.  He does not appear to be responding to internal/external stimuli or delusional thoughts.  Patient denies suicidal/self-harm/homicidal ideation, psychosis, and paranoia.  Patient answered question appropriately.  Past Psychiatric History: Anxiety  Risk to Self:  No Risk to Others:  No Prior Inpatient Therapy:  Yes Prior Outpatient Therapy:   Yes  Past Medical History:  Past Medical History:  Diagnosis Date  . Anxiety   . Depression   . HIV (human immunodeficiency virus infection) (HCC)   . HIV (human immunodeficiency virus infection) (HCC)     Past Surgical History:  Procedure Laterality Date  . CATARACT EXTRACTION Left    Family History:  Family History  Problem Relation Age of Onset  . Mental illness Mother   . Bipolar disorder Brother    Family Psychiatric  History: See above Social History:  Social History   Substance and Sexual Activity  Alcohol Use No     Social History   Substance and Sexual Activity  Drug Use No    Social History   Socioeconomic History  . Marital status: Single    Spouse name: Not on file  . Number of children: Not on file  . Years of education: Not on file  . Highest education level: Not on file  Occupational History  . Not on file  Tobacco Use  . Smoking status: Never Smoker  . Smokeless tobacco: Never Used  Substance and Sexual Activity  . Alcohol use: No  . Drug use: No  . Sexual activity: Yes  Other Topics Concern  . Not on file  Social History Narrative  . Not on file   Social Determinants of Health   Financial Resource Strain: Not on file  Food Insecurity: Not on file  Transportation Needs: Not on file  Physical Activity: Not on file  Stress: Not on file  Social Connections: Not on file   Additional Social History:    Allergies:  No Known Allergies  Labs:  Results for orders placed or performed during the hospital encounter of 11/03/20 (from the past 48 hour(s))  Urinalysis, Routine w reflex microscopic Urine, Catheterized     Status: Abnormal   Collection Time: 11/03/20  1:07 PM  Result Value Ref Range   Color, Urine YELLOW YELLOW   APPearance CLEAR CLEAR   Specific Gravity, Urine 1.025 1.005 - 1.030   pH 5.0 5.0 - 8.0   Glucose, UA NEGATIVE NEGATIVE mg/dL   Hgb urine dipstick MODERATE (A) NEGATIVE   Bilirubin Urine NEGATIVE NEGATIVE    Ketones, ur NEGATIVE NEGATIVE mg/dL   Protein, ur NEGATIVE NEGATIVE mg/dL   Nitrite NEGATIVE NEGATIVE   Leukocytes,Ua NEGATIVE NEGATIVE   RBC / HPF 0-5 0 - 5 RBC/hpf   WBC, UA 0-5 0 - 5 WBC/hpf   Bacteria, UA NONE SEEN NONE SEEN   Mucus PRESENT     Comment: Performed at [facility], [address] [place], [place] [address]  Rapid urine drug screen (hospital performed)     Status: None   Collection Time: 11/03/20  1:07 PM  Result Value Ref Range   Opiates NONE DETECTED NONE DETECTED   Cocaine NONE DETECTED NONE DETECTED   Benzodiazepines NONE DETECTED NONE DETECTED   Amphetamines NONE DETECTED NONE DETECTED   Tetrahydrocannabinol NONE DETECTED NONE DETECTED   Barbiturates NONE DETECTED NONE DETECTED    Comment: (NOTE) DRUG SCREEN FOR MEDICAL PURPOSES ONLY.  IF CONFIRMATION IS NEEDED FOR ANY PURPOSE, NOTIFY LAB WITHIN 5 DAYS.  LOWEST DETECTABLE LIMITS FOR URINE DRUG SCREEN Drug Class                     Cutoff (ng/mL) Amphetamine and metabolites    1000 Barbiturate and metabolites    200 Benzodiazepine                 200 Tricyclics and metabolites     300 Opiates and metabolites        300 Cocaine and metabolites        300 THC                            50 Performed at [facility], [address], [place], [place] [address]   Basic metabolic panel     Status: Abnormal   Collection Time: 11/03/20  2:30 PM  Result Value Ref Range   Sodium 147 (H) 135 - 145 mmol/L   Potassium 3.1 (L) 3.5 - 5.1 mmol/L   Chloride 110 98 - 111 mmol/L   CO2 24 22 - 32 mmol/L   Glucose, Bld 101 (H) 70 - 99 mg/dL    Comment: Glucose reference range applies only to samples taken after fasting for at least 8 hours.   BUN 25 (H) 6 - 20 mg/dL   Creatinine, Ser 3.54 0.61 - 1.24 mg/dL   Calcium 9.4 8.9 - 65.6 mg/dL   GFR, Estimated >81 >27 mL/min    Comment: (NOTE) Calculated using the CKD-EPI Creatinine Equation (2021)    Anion gap 13 5 - 15     Comment: Performed at [facility], [address], [place], [place] [address]  CBC with Differential     Status: None   Collection Time: 11/03/20  2:30 PM  Result Value Ref Range   WBC  9.0 4.0 - 10.5 K/uL   RBC 4.72 4.22 - 5.81 MIL/uL   Hemoglobin 14.7 13.0 - 17.0 g/dL   HCT 69.6 29.5 - 28.4 %   MCV 89.8 80.0 - 100.0 fL   MCH 31.1 26.0 - 34.0 pg   MCHC 34.7 30.0 - 36.0 g/dL   RDW 13.2 44.0 - 10.2 %   Platelets 328 150 - 400 K/uL   nRBC 0.0 0.0 - 0.2 %   Neutrophils Relative % 62 %   Neutro Abs 5.6 1.7 - 7.7 K/uL   Lymphocytes Relative 28 %   Lymphs Abs 2.5 0.7 - 4.0 K/uL   Monocytes Relative 8 %   Monocytes Absolute 0.7 0.1 - 1.0 K/uL   Eosinophils Relative 1 %   Eosinophils Absolute 0.1 0.0 - 0.5 K/uL   Basophils Relative 1 %   Basophils Absolute 0.1 0.0 - 0.1 K/uL   Immature Granulocytes 0 %   Abs Immature Granulocytes 0.02 0.00 - 0.07 K/uL    Comment: Performed at [facility], [address], [place], [place] [address]  Resp Panel by RT-PCR (Flu A&B, Covid) Nasopharyngeal Swab     Status: None   Collection Time: 11/03/20  6:45 PM   Specimen: Nasopharyngeal Swab; Nasopharyngeal(NP) swabs in vial transport medium  Result Value Ref Range   SARS Coronavirus 2 by RT PCR NEGATIVE NEGATIVE    Comment: (NOTE) SARS-CoV-2 target nucleic acids are NOT DETECTED.  The SARS-CoV-2 RNA is generally detectable in upper respiratory specimens during the acute phase of infection. The lowest concentration of SARS-CoV-2 viral copies this assay can detect is 138 copies/mL. A negative result does not preclude SARS-Cov-2 infection and should not be used as the sole basis for treatment or other patient management decisions. A negative result may occur with  improper specimen collection/handling, submission of specimen other than nasopharyngeal swab, presence of viral mutation(s) within the areas targeted by this assay, and inadequate number of  viral copies(<138 copies/mL). A negative result must be combined with clinical observations, patient history, and epidemiological information. The expected result is Negative.  Fact Sheet for Patients:  BloggerCourse.com  Fact Sheet for Healthcare Providers:  SeriousBroker.it  This test is no t yet approved or cleared by the [place] FDA and  has been authorized for detection and/or diagnosis of SARS-CoV-2 by FDA under an Emergency Use Authorization (EUA). This EUA will remain  in effect (meaning this test can be used) for the duration of the COVID-19 declaration under Section 564(b)(1) of the Act, 21 U.S.C.section 360bbb-3(b)(1), unless the authorization is terminated  or revoked sooner.       Influenza A by PCR NEGATIVE NEGATIVE   Influenza B by PCR NEGATIVE NEGATIVE    Comment: (NOTE) The Xpert Xpress SARS-CoV-2/FLU/RSV plus assay is intended as an aid in the diagnosis of influenza from Nasopharyngeal swab specimens and should not be used as a sole basis for treatment. Nasal washings and aspirates are unacceptable for Xpert Xpress SARS-CoV-2/FLU/RSV testing.  Fact Sheet for Patients: BloggerCourse.com  Fact Sheet for Healthcare Providers: SeriousBroker.it  This test is not yet approved or cleared by the [place] FDA and has been authorized for detection and/or diagnosis of SARS-CoV-2 by FDA under an Emergency Use Authorization (EUA). This EUA will remain in effect (meaning this test can be used) for the duration of the COVID-19 declaration under Section 564(b)(1) of the Act, 21 U.S.C. section 360bbb-3(b)(1), unless the authorization is terminated or revoked.  Performed at [facility],  [address] [place], [place] [address]     Current Facility-Administered Medications  Medication Dose Route Frequency Provider Last Rate Last Admin  .  bictegravir-emtricitabine-tenofovir AF (BIKTARVY) 50-200-25 MG per tablet 1 tablet  1 tablet Oral Daily Jacalyn Lefevre, MD   1 tablet at 11/04/20 [phone]  . busPIRone (BUSPAR) tablet 15 mg  15 mg Oral TID Jacalyn Lefevre, MD   15 mg at 11/04/20 2725  . hydrOXYzine (ATARAX/VISTARIL) tablet 25 mg  25 mg Oral TID PRN Jacalyn Lefevre, MD      . LORazepam (ATIVAN) injection 1 mg  1 mg Intramuscular Once Jacalyn Lefevre, MD      . LORazepam (ATIVAN) tablet 1 mg  1 mg Oral Q4H PRN Jacalyn Lefevre, MD   1 mg at 11/04/20 1035  . QUEtiapine (SEROQUEL) tablet 100 mg  100 mg Oral QHS Jacalyn Lefevre, MD   100 mg at 11/03/20 2124  . QUEtiapine (SEROQUEL) tablet 25 mg  25 mg Oral BID Dillen Belmontes B, NP   25 mg at 11/04/20 1410  . tamsulosin (FLOMAX) capsule 0.8 mg  0.8 mg Oral Daily Jacalyn Lefevre, MD   0.8 mg at 11/04/20 3664   Current Outpatient Medications  Medication Sig Dispense Refill  . bictegravir-emtricitabine-tenofovir AF (BIKTARVY) 50-200-25 MG TABS tablet Take 1 tablet by mouth daily. 30 tablet 5  . busPIRone (BUSPAR) 15 MG tablet Take 1 tablet (15 mg total) by mouth 3 (three) times daily. 90 tablet 0  . cholecalciferol (VITAMIN D3) 25 MCG (1000 UNIT) tablet Take 1,000 Units by mouth daily.    Marland Kitchen escitalopram (LEXAPRO) 20 MG tablet Take 1 tablet (20 mg total) by mouth daily. 30 tablet 0  . hydrOXYzine (ATARAX/VISTARIL) 25 MG tablet Take 1 tablet (25 mg total) by mouth 3 (three) times daily as needed for anxiety. 30 tablet 0  . ibuprofen (ADVIL) 100 MG tablet Take 100 mg by mouth every 6 (six) hours as needed for fever.    . Lactobacillus (PROBIOTIC ACIDOPHILUS PO) Take 1 tablet by mouth daily.    . Omega-3 Fatty Acids (FISH OIL) 1000 MG CAPS Take 1,000 mg by mouth daily.    . psyllium (METAMUCIL) 58.6 % packet Take 1 packet by mouth daily.    . QUEtiapine (SEROQUEL) 100 MG tablet Take 1 tablet (100 mg total) by mouth at bedtime. 30 tablet 0  . tamsulosin (FLOMAX) 0.4 MG CAPS capsule Take 2  capsules (0.8 mg total) by mouth daily. 30 capsule 0  . cephALEXin (KEFLEX) 500 MG capsule Take 1 capsule (500 mg total) by mouth every 8 (eight) hours. (Patient not taking: No sig reported) 12 capsule 0    Musculoskeletal: Strength & Muscle Tone: within normal limits Gait & Station: normal Patient leans: N/A  Psychiatric Specialty Exam: Physical Exam Vitals and nursing note reviewed. Chaperone present: Sitter at bedside.  Constitutional:      General: He is not in acute distress.    Appearance: Normal appearance. He is not ill-appearing.  HENT:     Head: Normocephalic.  Eyes:     Pupils: Pupils are equal, round, and reactive to light.  Cardiovascular:     Rate and Rhythm: Normal rate.  Pulmonary:     Effort: Pulmonary effort is normal.  Musculoskeletal:        General: Normal range of motion.     Cervical back: Normal range of motion.  Skin:    General: Skin is warm and dry.  Neurological:     Mental Status: He  is alert and oriented to person, place, and time.     Review of Systems  Constitutional: Negative.   HENT: Negative.   Eyes: Negative.   Respiratory: Negative.   Cardiovascular: Negative.   Gastrointestinal: Negative.   Genitourinary:       Retention, decreased, flow  Musculoskeletal: Negative.   Skin: Negative.   Neurological: Negative.   Hematological: Negative.   Psychiatric/Behavioral: Negative for agitation, behavioral problems, decreased concentration, self-injury, sleep disturbance and suicidal ideas. Nervous/anxious: Stable; better.     Blood pressure 115/88, pulse (!) 103, temperature 97.6 F (36.4 C), temperature source Oral, resp. rate 20, SpO2 96 %.There is no height or weight on file to calculate BMI.  General Appearance: Casual  Eye Contact:  Good  Speech:  No speech; sign language  interpreter used for assessment   Volume:  Normal  Mood:  Anxious  Affect:  Congruent  Thought Process:  Coherent, Goal Directed and Descriptions of  Associations: Intact  Orientation:  Full (Time, Place, and Person)  Thought Content:  WDL  Suicidal Thoughts:  No  Homicidal Thoughts:  No  Memory:  Immediate;   Good Recent;   Good  Judgement:  Intact  Insight:  Present  Psychomotor Activity:  Normal  Concentration:  Concentration: Good and Attention Span: Good  Recall:  Good  Fund of Knowledge:  Good  Language:  Good  Akathisia:  No  Handed:  Right  AIMS (if indicated):     Assets:  Communication Skills Desire for Improvement Housing Leisure Time  ADL's:  Intact  Cognition:  WNL  Sleep:     Patient able to contract for safety stating that he is not a danger to himself.  States if he is given something that will address his anxiety he feels he may be able to function at home.  Informed that Seroquel will be increased to 25 mg bid to help with anxiety and mood.  Patient has a history of benzodiazapine misuse and would recommend starting Ativan.     Treatment Plan Summary: Plan Psychiatrically clear  Disposition: No evidence of imminent risk to self or others at present.   Patient does not meet criteria for psychiatric inpatient admission. Refer to IOP. Discussed crisis plan, support from social network, calling 911, coming to the Emergency Department, and calling Suicide Hotline.        Follow-up Information    [facility] Follow up.   Why: 11/13/2020 at 3 pm. please keep your appointment. Contact information: [address] [place] [address] [place] [address] [phone]              Assunta Found, NP 11/04/2020 3:01 PM

## 2020-11-04 NOTE — ED Notes (Signed)
NP with patient with [name]) interpreter

## 2020-11-04 NOTE — ED Notes (Signed)
Patient awake Patient received breakfast tray

## 2020-11-04 NOTE — ED Notes (Signed)
Pt anxious and agitated. Pt states, will stab self if goes home.

## 2020-11-04 NOTE — ED Provider Notes (Addendum)
Patient has been cleared by psychiatry.  He is stable for discharge.  AVS completed by psych team.   Derwood Kaplan, MD 11/04/20 1243   3:10 PM Patient was anxious about going home.  He wants resolution to his urinary symptoms.  We will start him on Flomax.  I discussed the case with case management.  They have confirmed an appointment with urologist on 17th for the patient.  There will be no charge associated with this visit.  Until then, I requested that patient take Flomax and psychiatry team is adding Seroquel 25 mg twice daily for his anxiety.  Patient is comfortable going home. He has been advised to come back to the ER if his symptoms get worse.  His medications [facility] to [place] [facility] so that we can handed them to him.   Derwood Kaplan, MD 11/04/20 1511

## 2020-11-05 LAB — URINE CULTURE: Culture: <10000 — AB

## 2020-11-07 ENCOUNTER — Other Ambulatory Visit: Payer: Self-pay

## 2020-11-07 ENCOUNTER — Encounter (HOSPITAL_COMMUNITY): Payer: Self-pay

## 2020-11-07 ENCOUNTER — Emergency Department (HOSPITAL_COMMUNITY)
Admission: EM | Admit: 2020-11-07 | Discharge: 2020-11-10 | Disposition: A | Discharge status: Home / Self Care | Payer: Self-pay | Attending: Emergency Medicine | Admitting: Emergency Medicine

## 2020-11-07 DIAGNOSIS — F333 Major depressive disorder, recurrent, severe with psychotic symptoms: Secondary | ICD-10-CM | POA: Diagnosis present

## 2020-11-07 DIAGNOSIS — F332 Major depressive disorder, recurrent severe without psychotic features: Secondary | ICD-10-CM | POA: Insufficient documentation

## 2020-11-07 DIAGNOSIS — R109 Unspecified abdominal pain: Secondary | ICD-10-CM | POA: Insufficient documentation

## 2020-11-07 DIAGNOSIS — F411 Generalized anxiety disorder: Secondary | ICD-10-CM | POA: Diagnosis present

## 2020-11-07 DIAGNOSIS — Z21 Asymptomatic human immunodeficiency virus [HIV] infection status: Secondary | ICD-10-CM | POA: Insufficient documentation

## 2020-11-07 DIAGNOSIS — R339 Retention of urine, unspecified: Secondary | ICD-10-CM | POA: Insufficient documentation

## 2020-11-07 DIAGNOSIS — F419 Anxiety disorder, unspecified: Secondary | ICD-10-CM | POA: Insufficient documentation

## 2020-11-07 DIAGNOSIS — R45851 Suicidal ideations: Secondary | ICD-10-CM | POA: Insufficient documentation

## 2020-11-07 DIAGNOSIS — F410 Panic disorder [episodic paroxysmal anxiety] without agoraphobia: Secondary | ICD-10-CM | POA: Diagnosis present

## 2020-11-07 LAB — URINALYSIS, ROUTINE W REFLEX MICROSCOPIC
Bilirubin Urine: NEGATIVE
Glucose, UA: NEGATIVE mg/dL
Ketones, ur: NEGATIVE mg/dL
Leukocytes,Ua: NEGATIVE
Nitrite: NEGATIVE
Protein, ur: NEGATIVE mg/dL
Specific Gravity, Urine: 1.028 (ref 1.005–1.030)
pH: 5 (ref 5.0–8.0)

## 2020-11-07 LAB — COMPREHENSIVE METABOLIC PANEL
ALT: 17 U/L (ref 0–44)
AST: 16 U/L (ref 15–41)
Albumin: 4.1 g/dL (ref 3.5–5.0)
Alkaline Phosphatase: 73 U/L (ref 38–126)
Anion gap: 11 (ref 5–15)
BUN: 24 mg/dL — ABNORMAL HIGH (ref 6–20)
CO2: 24 mmol/L (ref 22–32)
Calcium: 8.9 mg/dL (ref 8.9–10.3)
Chloride: 107 mmol/L (ref 98–111)
Creatinine, Ser: 0.98 mg/dL (ref 0.61–1.24)
GFR, Estimated: >60 mL/min (ref >60)
Glucose, Bld: 107 mg/dL — ABNORMAL HIGH (ref 70–99)
Potassium: 3 mmol/L — ABNORMAL LOW (ref 3.5–5.1)
Sodium: 142 mmol/L (ref 135–145)
Total Bilirubin: 0.9 mg/dL (ref 0.3–1.2)
Total Protein: 6.7 g/dL (ref 6.5–8.1)

## 2020-11-07 LAB — CBC WITH DIFFERENTIAL/PLATELET
Abs Immature Granulocytes: 0.01 10*3/uL (ref 0.00–0.07)
Basophils Absolute: 0 10*3/uL (ref 0.0–0.1)
Basophils Relative: 1 %
Eosinophils Absolute: 0.1 10*3/uL (ref 0.0–0.5)
Eosinophils Relative: 2 %
HCT: 42.3 % (ref 39.0–52.0)
Hemoglobin: 14.4 g/dL (ref 13.0–17.0)
Immature Granulocytes: 0 %
Lymphocytes Relative: 38 %
Lymphs Abs: 2.4 10*3/uL (ref 0.7–4.0)
MCH: 31 pg (ref 26.0–34.0)
MCHC: 34 g/dL (ref 30.0–36.0)
MCV: 91 fL (ref 80.0–100.0)
Monocytes Absolute: 0.5 10*3/uL (ref 0.1–1.0)
Monocytes Relative: 8 %
Neutro Abs: 3.3 10*3/uL (ref 1.7–7.7)
Neutrophils Relative %: 51 %
Platelets: 286 10*3/uL (ref 150–400)
RBC: 4.65 MIL/uL (ref 4.22–5.81)
RDW: 13.4 % (ref 11.5–15.5)
WBC: 6.4 10*3/uL (ref 4.0–10.5)
nRBC: 0 % (ref 0.0–0.2)

## 2020-11-07 MED ORDER — TAMSULOSIN HCL 0.4 MG PO CAPS
0.4000 mg | ORAL_CAPSULE | Freq: Every day | ORAL | Status: DC
  Administered 2020-11-07 – 2020-11-09 (×3): 0.4 mg via ORAL
  Filled 2020-11-07 (×3): qty 1

## 2020-11-07 MED ORDER — BICTEGRAVIR-EMTRICITAB-TENOFOV 50-200-25 MG PO TABS
1.0000 | ORAL_TABLET | Freq: Every day | ORAL | Status: DC
  Administered 2020-11-08 – 2020-11-10 (×3): 1 via ORAL
  Filled 2020-11-07 (×3): qty 1

## 2020-11-07 MED ORDER — HYDROXYZINE HCL 25 MG PO TABS
25.0000 mg | ORAL_TABLET | Freq: Three times a day (TID) | ORAL | Status: DC | PRN
  Administered 2020-11-09: 25 mg via ORAL
  Filled 2020-11-07: qty 1

## 2020-11-07 MED ORDER — ZOLPIDEM TARTRATE 5 MG PO TABS: 5.0000 mg | ORAL_TABLET | Freq: Every evening | ORAL | Status: DC | PRN

## 2020-11-07 MED ORDER — QUETIAPINE FUMARATE 25 MG PO TABS
25.0000 mg | ORAL_TABLET | Freq: Two times a day (BID) | ORAL | Status: DC
  Administered 2020-11-07 – 2020-11-10 (×7): 25 mg via ORAL
  Filled 2020-11-07 (×7): qty 1

## 2020-11-07 MED ORDER — LORAZEPAM 1 MG PO TABS
1.0000 mg | ORAL_TABLET | ORAL | Status: AC | PRN
  Administered 2020-11-07: 1 mg via ORAL
  Filled 2020-11-07: qty 1

## 2020-11-07 MED ORDER — ZIPRASIDONE MESYLATE 20 MG IM SOLR: 20.0000 mg | INTRAMUSCULAR | Status: DC | PRN

## 2020-11-07 MED ORDER — BUSPIRONE HCL 10 MG PO TABS
15.0000 mg | ORAL_TABLET | Freq: Three times a day (TID) | ORAL | Status: DC
  Administered 2020-11-07 – 2020-11-10 (×8): 15 mg via ORAL
  Filled 2020-11-07 (×8): qty 2

## 2020-11-07 MED ORDER — QUETIAPINE FUMARATE 100 MG PO TABS
100.0000 mg | ORAL_TABLET | Freq: Every day | ORAL | Status: DC
  Administered 2020-11-07 – 2020-11-09 (×3): 100 mg via ORAL
  Filled 2020-11-07 (×3): qty 1

## 2020-11-07 MED ORDER — ESCITALOPRAM OXALATE 10 MG PO TABS
20.0000 mg | ORAL_TABLET | Freq: Every day | ORAL | Status: DC
  Administered 2020-11-07 – 2020-11-10 (×4): 20 mg via ORAL
  Filled 2020-11-07 (×4): qty 2

## 2020-11-07 MED ORDER — RISPERIDONE 1 MG PO TBDP: 2.0000 mg | ORAL_TABLET | Freq: Three times a day (TID) | ORAL | Status: DC | PRN

## 2020-11-07 MED ORDER — ACETAMINOPHEN 325 MG PO TABS: 650.0000 mg | ORAL_TABLET | ORAL | Status: DC | PRN

## 2020-11-07 NOTE — ED Provider Notes (Signed)
[facility] Note   CSN: [id number] Arrival date & time: 11/07/20  1248     History Chief Complaint  Patient presents with  . Abdominal Pain  . Urinary Retention    Tyler Horn is a 60 y.o. male.  HPI    60 year old male comes in a chief complaint of abdominal pain and urinary retention.  Patient has history of HIV, anxiety and he has been seen with this complaints multiple times in the last month.  Patient was just discharged 2 days back.  He alleges that after being discharged, he was able to urinate fine for the first day but thereafter he started having urinary urgency without any results.  When he tries to force himself to urinate, hardly any urine comes out.  What ever comes out is usually a dribble.  This urinary issue has been driving him " crazy" and has him frustrated to the point where he wants to kill himself.  Patient alleges that he had a knife next to him and police had to bring him in, otherwise he was going to cut himself.  Patient was started on Flomax at the last visit.  He was seen by psychiatry service and they had added some Seroquel to his anxiety regimen.  Patient has urology appointment next week.  Interpreter service was utilized for this encounter.  Past Medical History:  Diagnosis Date  . Anxiety   . Depression   . HIV (human immunodeficiency virus infection) (HCC)   . HIV (human immunodeficiency virus infection) [facility])     Patient Active Problem List   Diagnosis Date Noted  . MDD (major depressive disorder), recurrent episode, severe (HCC) 10/16/2020  . Vision changes 07/09/2020  . Weight loss 07/09/2020  . Chronic hepatitis C (HCC) 04/27/2020  . Idiopathic peripheral neuropathy 04/27/2020  . Herpes simplex 04/27/2020  . Herpes zoster 04/27/2020  . Healthcare maintenance 12/19/2019  . 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 Post-traumatic stress disorder, unspecified 11/28/2019  . 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 Diarrhea   . Anxiety state   . Severe recurrent major depression without psychotic features (HCC) 07/13/2017  . Major depressive disorder, recurrent, severe without psychotic behavior (HCC) 07/13/2017  . MDD (major depressive disorder), recurrent, severe, with psychosis (HCC) 07/31/2014  . Benzodiazepine misuse 07/31/2014  . Human immunodeficiency virus infection (HCC)   . Severe major depression (HCC) 07/30/2014  . Delusional disorder (HCC) 07/30/2014  . Suicidal thoughts 07/30/2014  . Hallucinations   . Major depressive disorder, recurrent episode (HCC) 10/03/2013  . Anxiety, generalized 03/26/2013  . Generalized anxiety disorder 03/26/2013    Past Surgical History:  Procedure Laterality Date  . CATARACT EXTRACTION Left        Family History  Problem Relation Age of Onset  . Mental illness Mother   . Bipolar disorder Brother     Social History   Tobacco Use  . Smoking status: Never Smoker  . Smokeless tobacco: Never Used  Substance Use Topics  . Alcohol use: No  . Drug use: No    Home Medications Prior to Admission medications   Medication Sig Start Date End Date Taking? Authorizing Provider  bictegravir-emtricitabine-tenofovir AF (BIKTARVY) 50-200-25 MG TABS tablet Take 1 tablet by mouth daily. 10/23/20   Veryl Speak, FNP  busPIRone (BUSPAR) 15 MG tablet Take 1 tablet (15 mg total) by mouth 3 (three) times daily. 10/23/20   Antonieta Pert, MD  cephALEXin (  KEFLEX) 500 MG capsule Take 1 capsule (500 mg total) by mouth every 8 (eight) hours. Patient not taking: No sig reported 10/23/20   Antonieta Pert, MD  cholecalciferol (VITAMIN D3) 25 MCG (1000 UNIT) tablet Take 1,000 Units by mouth daily.    [provider]  escitalopram (LEXAPRO) 20 MG tablet Take 1 tablet (20 mg total) by mouth daily. 10/23/20   Antonieta Pert, MD  hydrOXYzine  (ATARAX/VISTARIL) 25 MG tablet Take 1 tablet (25 mg total) by mouth 3 (three) times daily as needed for anxiety. 10/23/20   Antonieta Pert, MD  ibuprofen (ADVIL) 100 MG tablet Take 100 mg by mouth every 6 (six) hours as needed for fever.    [provider]  Lactobacillus (PROBIOTIC ACIDOPHILUS PO) Take 1 tablet by mouth daily.    [provider]  Omega-3 Fatty Acids (FISH OIL) 1000 MG CAPS Take 1,000 mg by mouth daily.    [provider]  psyllium (METAMUCIL) 58.6 % packet Take 1 packet by mouth daily.    [provider]  QUEtiapine (SEROQUEL) 100 MG tablet Take 1 tablet (100 mg total) by mouth at bedtime. 10/23/20   Antonieta Pert, MD  QUEtiapine (SEROQUEL) 25 MG tablet Take 1 tablet (25 mg total) by mouth 2 (two) times daily. 11/04/20   Derwood Kaplan, MD  tamsulosin (FLOMAX) 0.4 MG CAPS capsule Take 1 capsule (0.4 mg total) by mouth daily. 11/04/20   Derwood Kaplan, MD    Allergies    Patient has no known allergies.  Review of Systems   Review of Systems  Constitutional: Positive for activity change.  Respiratory: Negative for shortness of breath.   Cardiovascular: Negative for chest pain.  Gastrointestinal: Positive for abdominal pain. Negative for nausea and vomiting.  Genitourinary: Positive for difficulty urinating.  All other systems reviewed and are negative.   Physical Exam Updated Vital Signs BP 111/88   Pulse 87   Temp 97.8 F (36.6 C)   Resp 15   SpO2 100%   Physical Exam Vitals and nursing note reviewed.  Constitutional:      Appearance: He is well-developed.  HENT:     Head: Atraumatic.  Cardiovascular:     Rate and Rhythm: Normal rate.  Pulmonary:     Effort: Pulmonary effort is normal.  Musculoskeletal:     Cervical back: Neck supple.  Skin:    General: Skin is warm.  Neurological:     Mental Status: He is alert and oriented to person, place, and time.     ED Results / Procedures / Treatments   Labs (all  labs ordered are listed, but only abnormal results are displayed) Labs Reviewed  COMPREHENSIVE METABOLIC PANEL - Abnormal; Notable for the following components:      Result Value   Potassium 3.0 (*)    Glucose, Bld 107 (*)    BUN 24 (*)    All other components within normal limits  URINALYSIS, ROUTINE W REFLEX MICROSCOPIC - Abnormal; Notable for the following components:   Hgb urine dipstick SMALL (*)    Bacteria, UA RARE (*)    All other components within normal limits  CBC WITH DIFFERENTIAL/PLATELET    EKG None  Radiology No results found.  Procedures Procedures   Medications Ordered in ED Medications  risperiDONE (RISPERDAL M-TABS) disintegrating tablet 2 mg (has no administration in time range)    And  LORazepam (ATIVAN) tablet 1 mg (has no administration in time range)    And  ziprasidone (GEODON) injection 20 mg (has no administration in time range)  acetaminophen (TYLENOL) tablet 650 mg (has no administration in time range)  zolpidem (AMBIEN) tablet 5 mg (has no administration in time range)    ED Course  I have reviewed the triage vital signs and the nursing notes.  Pertinent labs & imaging results that were available during my care of the patient were reviewed by me and considered in my medical decision making (see chart for details).    MDM Rules/Calculators/A&P                          60 year old male comes in with chief complaint of urinary discomfort, abdominal pain.  All of this is leading to anxiety and is having SI.  Patient has been seen in the ER multiple times recently for this complaint.  He has had multiple bladder scans and even to Foley catheters, all of which revealing no urinary retention.  Today, the nursing staff completed a bladder scan.  It read 0 cc.  The nurse proceeded to put a Foley catheter which has revealed 35 cc of urine output.   It is clear that there is no organic issue of urinary retention.  The theory is that patient might  have enlarged prostate or some other nonemergent issue that might make him have some urinary hesitancy VS. there is no actual organic issue, patient is having paranoia about not being able to urinate, and attempts to urinate when there is no urine in his bladder -all of which is leading to vicious cycle of worsening anxiety.  Psychiatry team has seen him on all occasions.  He was admitted on one occasion and last time he was here, they added Seroquel.  I discussed the case with the urologist on-call.  Her question was if patient should get some medication for potential spasms, that might be perceived by him as having to go to urinate.  The urologist said that we should remove the Foley catheter, the spasm should resolve on their own thereafter.  We should not start patient on any anticholinergic as they have history of interacting with psych medications.  They recommend continuing Flomax and having the patient see them in the clinic for further urodynamic studies.  Patient is medically cleared for psych eval  Final Clinical Impression(s) / ED Diagnoses Final diagnoses:  Anxiety    Rx / DC Orders ED Discharge Orders    None       Derwood Kaplan, MD 11/07/20 1516

## 2020-11-07 NOTE — ED Notes (Signed)
Pt stated it is ok to update his mother regarding his care.  Updated pt's mother on plan of care.

## 2020-11-07 NOTE — ED Triage Notes (Signed)
Very anxious, c/o lower abdominal pain and inability to void.

## 2020-11-07 NOTE — ED Notes (Signed)
Pt ambulated to restroom and back to ED room.  Patient is now resting comfortably.

## 2020-11-07 NOTE — Progress Notes (Signed)
TOC CM case reviewed. Pt has multiple ED visits in past 6 months. Pt was given his meds 3 days ago using [facility]. Has upcoming charity appt with [facility] and was instructed to keep appt as they see pt without insurance. Isidoro Donning RN CCM, [facility] ED TOC CM [phone]

## 2020-11-07 NOTE — ED Notes (Signed)
Bladder scan 0, post foley cath output of 75 ml

## 2020-11-07 NOTE — ED Notes (Signed)
Pt resting at this time, I will continue to monitor. 
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
## 2020-11-08 NOTE — Progress Notes (Signed)
PerJamison Lord, DNP, patient meets criteria for inpatient treatment. There are no available or appropriate beds at [facility] today. CSW faxed referrals to the following facilities for review:  [facility]  TTS will continue to seek bed placement.  Deke Tilghman, MSW, LCSW-A, LCAS-A Phone: [phone] Disposition/TOC 

## 2020-11-08 NOTE — ED Provider Notes (Signed)
Emergency Medicine Observation Re-evaluation Note  Tyler Horn is a 60 y.o. male, seen on rounds today.  Pt initially presented to the ED for complaints of Abdominal Pain and Urinary Retention Currently, the patient is Holding in the ED for an inpatient psychiatric treatment bed.  Physical Exam  BP 103/70 (BP Location: Right Arm)   Pulse 73   Temp 97.8 F (36.6 C)   Resp 20   Ht 1.93 m (6\' 4" )   Wt 91.9 kg   SpO2 97%   BMI 24.66 kg/m  Physical Exam General: Resting Cardiac: Regular rate Lungs: Breathing easily no stridor Psych: Calm mood  ED Course / MDM  EKG:    I have reviewed the labs performed to date as well as medications administered while in observation.  Recent changes in the last 24 hours include evaluation by psychiatry that recommend inpatient treatment.  Plan  Current plan is for inpatient treatment.    , MD 11/08/20 [phone]
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
## 2020-11-08 NOTE — ED Notes (Signed)
Mother would like update when he has placement

## 2020-11-09 DIAGNOSIS — F332 Major depressive disorder, recurrent severe without psychotic features: Secondary | ICD-10-CM

## 2020-11-09 DIAGNOSIS — F411 Generalized anxiety disorder: Secondary | ICD-10-CM

## 2020-11-09 MED ORDER — POLYETHYLENE GLYCOL 3350 17 G PO PACK
17.0000 g | PACK | Freq: Every day | ORAL | Status: DC | PRN
  Administered 2020-11-09: 17 g via ORAL
  Filled 2020-11-09 (×3): qty 1

## 2020-11-09 MED ORDER — TAMSULOSIN HCL 0.4 MG PO CAPS
0.8000 mg | ORAL_CAPSULE | Freq: Every day | ORAL | Status: DC
  Administered 2020-11-10: 0.8 mg via ORAL
  Filled 2020-11-09: qty 2

## 2020-11-09 NOTE — Consult Note (Signed)
[name] Face-to-Face Psychiatry Consult   Reason for Consult:  Suicide threats Referring Physician:  [name] Patient Identification: Tyler Horn MRN:  [id number] Principal Diagnosis: Major depressive disorder, recurrent, severe without psychotic behavior (HCC) Diagnosis:  Principal Problem:   Major depressive disorder, recurrent, severe without psychotic behavior (HCC) Active Problems:   Generalized anxiety disorder   Total Time spent with patient: 45 minutes  Subjective:   Tyler Horn is a 60 y.o. male patient admitted with suicide threats.  HPI:  60 yo male presents with suicidal ideations and plan to kill himself by stabbing.  Admitted six times within a short period of time.  He was at [facility] in late February, states via sign language with an interpreter that he has "lots of anxiety and thought of harming myself.  I have issues going to the bathroom.  I don't know if it's stress, overwhelmed."  States he is overwhelmed with being home along and creates high anxiety with feelings of not being safe with his thoughts.  Frequently threatens self harm with a knife especially when frustrated or upset when he is asked to do things he does not want to do.  Friday, they completed tests and a urinary cath to produce 120 mLs, not an issue.  Educated the client on somatic symptoms of anxiety and he agreed.  Yesterday, he was more cooperative.  Today, he is going to the rest complaining he has constipation.  Assessment interrupted multiple times for him to go to the rest room.  Ended the interpreter until he was lying in bed calm and cooperative within a short amount of time.  When discussing the underlying issue of him being alone he became agitated despite explaining we (through an interpreter) were trying to work with him to better meet his needs as multiple hospitalizations have not been effective for him.  Tried to discuss an ACT team and/or group home, he responded, "You can't get in unless you are retarded."   Became more and more agitated with standing and coming at this NP making a knife motion as he requested a knife to kill himself.  He does have the ability to talk and sign along with some hearing capability as he was easily understood in this instant by this provider and NP student. The RN reported the last time he was here and she told him he had been discharged, he did the same thing to her.  This time I was trying to work with him on a treatment plan, not cooperative.  It is interesting that he can stop and start his somatic symptoms at will along with his anxious signs. Yesterday he did report having financial issues, unsure if homelessness is an issue or threat of eviction.  Uncooperative.   Past Psychiatric History: depression and anxiety  Risk to Self:  many threats Risk to Others:  aggressive at times Prior Inpatient Therapy:  6 times within a short period Prior Outpatient Therapy:  [facility]  Past Medical History:  Past Medical History:  Diagnosis Date  . Anxiety   . Depression   . HIV (human immunodeficiency virus infection) (HCC)   . HIV (human immunodeficiency virus infection) (HCC)     Past Surgical History:  Procedure Laterality Date  . CATARACT EXTRACTION Left    Family History:  Family History  Problem Relation Age of Onset  . Mental illness Mother   . Bipolar disorder Brother    Family Psychiatric  History: unknown Social History:  Social History   Substance and  Sexual Activity  Alcohol Use No     Social History   Substance and Sexual Activity  Drug Use No    Social History   Socioeconomic History  . Marital status: Single    Spouse name: Not on file  . Number of children: Not on file  . Years of education: Not on file  . Highest education level: Not on file  Occupational History  . Not on file  Tobacco Use  . Smoking status: Never Smoker  . Smokeless tobacco: Never Used  Substance and Sexual Activity  . Alcohol use: No  . Drug use: No  . Sexual  activity: Yes  Other Topics Concern  . Not on file  Social History Narrative  . Not on file   Social Determinants of Health   Financial Resource Strain: Not on file  Food Insecurity: Not on file  Transportation Needs: Not on file  Physical Activity: Not on file  Stress: Not on file  Social Connections: Not on file   Additional Social History:    Allergies:  No Known Allergies  Labs: No results found for this or any previous visit (from the past 48 hour(s)).  Current Facility-Administered Medications  Medication Dose Route Frequency Provider Last Rate Last Admin  . acetaminophen (TYLENOL) tablet 650 mg  650 mg Oral Q4H PRN Derwood Kaplan, MD      . bictegravir-emtricitabine-tenofovir AF (BIKTARVY) 50-200-25 MG per tablet 1 tablet  1 tablet Oral Daily Derwood Kaplan, MD   1 tablet at 11/09/20 0937  . busPIRone (BUSPAR) tablet 15 mg  15 mg Oral TID Derwood Kaplan, MD   15 mg at 11/09/20 0937  . escitalopram (LEXAPRO) tablet 20 mg  20 mg Oral Daily Rhunette Croft, Ankit, MD   20 mg at 11/09/20 0937  . hydrOXYzine (ATARAX/VISTARIL) tablet 25 mg  25 mg Oral TID PRN Derwood Kaplan, MD   25 mg at 11/09/20 0937  . polyethylene glycol (MIRALAX / GLYCOLAX) packet 17 g  17 g Oral Daily PRN Long, Arlyss Repress, MD   17 g at 11/09/20 1136  . QUEtiapine (SEROQUEL) tablet 100 mg  100 mg Oral QHS Nanavati, Ankit, MD   100 mg at 11/08/20 2130  . QUEtiapine (SEROQUEL) tablet 25 mg  25 mg Oral BID Derwood Kaplan, MD   25 mg at 11/09/20 0936  . risperiDONE (RISPERDAL M-TABS) disintegrating tablet 2 mg  2 mg Oral Q8H PRN Rhunette Croft, Ankit, MD       And  . ziprasidone (GEODON) injection 20 mg  20 mg Intramuscular PRN Derwood Kaplan, MD      . Melene Muller ON 11/10/2020] tamsulosin (FLOMAX) capsule 0.8 mg  0.8 mg Oral Daily Keara Pagliarulo Y, NP      . zolpidem (AMBIEN) tablet 5 mg  5 mg Oral QHS PRN Derwood Kaplan, MD       Current Outpatient Medications  Medication Sig Dispense Refill  .  bictegravir-emtricitabine-tenofovir AF (BIKTARVY) 50-200-25 MG TABS tablet Take 1 tablet by mouth daily. 30 tablet 5  . busPIRone (BUSPAR) 15 MG tablet Take 1 tablet (15 mg total) by mouth 3 (three) times daily. 90 tablet 0  . cholecalciferol (VITAMIN D3) 25 MCG (1000 UNIT) tablet Take 1,000 Units by mouth daily.    Marland Kitchen escitalopram (LEXAPRO) 20 MG tablet Take 1 tablet (20 mg total) by mouth daily. 30 tablet 0  . hydrOXYzine (ATARAX/VISTARIL) 25 MG tablet Take 1 tablet (25 mg total) by mouth 3 (three) times daily as needed for anxiety. 30 tablet  0  . Lactobacillus (PROBIOTIC ACIDOPHILUS PO) Take 1 tablet by mouth daily.    . Omega-3 Fatty Acids (FISH OIL) 1000 MG CAPS Take 1,000 mg by mouth daily.    . QUEtiapine (SEROQUEL) 100 MG tablet Take 1 tablet (100 mg total) by mouth at bedtime. 30 tablet 0  . QUEtiapine (SEROQUEL) 25 MG tablet Take 1 tablet (25 mg total) by mouth 2 (two) times daily. 30 tablet 1  . tamsulosin (FLOMAX) 0.4 MG CAPS capsule Take 1 capsule (0.4 mg total) by mouth daily. 30 capsule 0  . cephALEXin (KEFLEX) 500 MG capsule Take 1 capsule (500 mg total) by mouth every 8 (eight) hours. (Patient not taking: No sig reported) 12 capsule 0  . ibuprofen (ADVIL) 100 MG tablet Take 100 mg by mouth every 6 (six) hours as needed for fever.    . psyllium (METAMUCIL) 58.6 % packet Take 1 packet by mouth daily.      Musculoskeletal: Strength & Muscle Tone: within normal limits Gait & Station: normal Patient leans: N/A    Physical Exam: Physical Exam Vitals and nursing note reviewed.  Constitutional:      Appearance: He is well-developed.  HENT:     Head: Normocephalic.  Neurological:     General: No focal deficit present.     Mental Status: He is alert.  Psychiatric:        Attention and Perception: He is inattentive.        Mood and Affect: Mood is anxious and depressed.        Behavior: Behavior is agitated and aggressive.        Thought Content: Thought content is  delusional. Thought content includes suicidal ideation.        Cognition and Memory: Cognition and memory normal.        Judgment: Judgment is impulsive and inappropriate.     Comments: Patient is deaf with some hearing, uses ASL    Review of Systems  Psychiatric/Behavioral: Positive for depression and suicidal ideas. The patient is nervous/anxious.   All other systems reviewed and are negative.  Blood pressure 120/88, pulse 100, temperature 97.6 F (36.4 C), temperature source Oral, resp. rate 20, height 6\' 4"  (1.93 m), weight 91.9 kg, SpO2 95 %. Body mass index is 24.66 kg/m.  Treatment Plan Summary: Major depressive disorder, recurrent, severe without psychosis: -Continue Lexapro 20 mg daily -Needs an ACT team and/or group home or assisted living as he does not want to live alone  Anxiety: -Continue Buspar 15 mg TID -Continue Seroquel 25 mg BID -Continue Vistaril 25 mg TID PRN  Insomnia: -Continue Seroquel 100 mg at bedtime -Continue Ambien 5 mg at bedtime  Disposition: Recommend psychiatric Inpatient admission when medically cleared.  , NP 11/09/2020 4:03 PM

## 2020-11-09 NOTE — ED Provider Notes (Signed)
Emergency Medicine Observation Re-evaluation Note  Tyler Horn is a 60 y.o. male, seen on rounds today.  Pt initially presented to the ED for complaints of Abdominal Pain and Urinary Retention Currently, the patient is inpatient psych placement.   Physical Exam  BP 132/75 (BP Location: Left Arm)   Pulse 86   Temp 97.7 F (36.5 C) (Oral)   Resp 20   Ht 6\' 4"  (1.93 m)   Wt 91.9 kg   SpO2 98%   BMI 24.66 kg/m  Physical Exam General: Calm and cooperative.  Cardiac: Well perfused.  Lungs: Even, unlabored respirations.  Psych: Calm.   ED Course / MDM  EKG:    I have reviewed the labs performed to date as well as medications administered while in observation.  Recent changes in the last 24 hours include awaiting placement.   Plan  Current plan is for inpatient placement.     , MD 11/09/20 1723

## 2020-11-09 NOTE — ED Notes (Signed)
Pt has been cooperative.  Pt has been anxious during shift.

## 2020-11-09 NOTE — Progress Notes (Signed)
CSW re-faxed pt to the following faculties for placement, as there are no beds available at [facility]:  [facility]   TTS/Disposition CSW will continue to assess for placement needs.    Tarus Briski S. Alan Ripper, MSW, LCSW Clinical Social Worker 11/09/2020 10:58 AM

## 2020-11-10 ENCOUNTER — Encounter: Payer: Self-pay | Admitting: Student

## 2020-11-10 ENCOUNTER — Encounter (HOSPITAL_COMMUNITY): Payer: Self-pay | Admitting: Registered Nurse

## 2020-11-10 DIAGNOSIS — R45851 Suicidal ideations: Secondary | ICD-10-CM

## 2020-11-10 NOTE — Progress Notes (Signed)
..  Transition of Care [facility]) - Emergency Department Mini Assessment   Patient Details  Name: Tyler Horn MRN: [id number] Date of Birth: 1961/03/05  [facility]) CM/SW Contact:    Jeannette Maddy C Tarpley-Carter, LCSWA Phone Number: 11/10/2020, 11:33 AM   Clinical Narrative: TOC CM/CSW reached out to pts mom/Joan Jackson [phone] due to pt being in need of an interpreter.  Pt lives a lone, in his apartment.  Pt has a case worker/Shaletha Ross with [facility] [phone] or [phone], ext. [phone].  [name] is currently looking into making arrangements with [facility] to get assistance with pt.  CSW will continue to follow for dc needs.Estiven Kohan Tarpley-Carter, MSW, LCSW-A Pronouns:  She, Her, Hers                  [facility] Transitions of CareClinical Social Worker [email or web address] [phone]   ED Mini Assessment: What brought you to the Emergency Department? : Urinary Retention  Barriers to Discharge: No Barriers Identified     Means of departure: Not know  Interventions which prevented an admission or readmission: Transportation Screening,Other (must enter comment) (Discharge planning)    Patient Contact and Communications Key Contact 1: [name]   Spoke with: [name] Contact Date: 11/10/20,     Contact Phone Number: [phone] Call outcome: Pt lives alone.  He has his own apartment.  Patient states their goals for this hospitalization and ongoing recovery are:: Pt wants to return home.   Choice offered to / list presented to : NA  Admission diagnosis:  uti;catheter issue Patient Active Problem List   Diagnosis Date Noted  . Vision changes 07/09/2020  . Weight loss 07/09/2020  . Chronic hepatitis C (HCC) 04/27/2020  . Idiopathic peripheral neuropathy 04/27/2020  . Herpes simplex 04/27/2020  . Herpes zoster 04/27/2020  . 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 Post-traumatic stress disorder, unspecified  11/28/2019  . 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 Major depressive disorder, recurrent, severe without psychotic behavior (HCC) 07/13/2017  . Benzodiazepine misuse 07/31/2014  . Human immunodeficiency virus infection (HCC)   . Delusional disorder (HCC) 07/30/2014  . Suicidal thoughts 07/30/2014  . Generalized anxiety disorder 03/26/2013   PCP:  Veryl Speak, FNP Pharmacy:   [facility] [phone] Ginette Otto, [place] - [address] AT [facility] OF [address] [place] [address] Phone: [phone] Fax: [phone]  [facility] - [place], [place] - [address] [place] [address] [place] [address] Phone: [phone] Fax: [phone]

## 2020-11-10 NOTE — ED Notes (Signed)
Patient received breakfast tray 

## 2020-11-10 NOTE — Consult Note (Signed)
Telepsych Consultation   Reason for Consult:  Suicidal ideation Referring Physician:  Derwood Kaplan, MD Location of Patient: [facility] ED Location of Provider: Other: [facility]  Patient Identification: Tyler Horn MRN:  [id number] Principal Diagnosis: Major depressive disorder, recurrent, severe without psychotic behavior (HCC) Diagnosis:  Principal Problem:   Major depressive disorder, recurrent, severe without psychotic behavior (HCC) Active Problems:   Suicidal thoughts   Generalized anxiety disorder   Total Time spent with patient: 30 minutes  Subjective:   Tyler Horn is a 60 y.o. male patient with history major depressive disorder with and without psychosis was admitted to [facility] ED after presenting with complaints of suicidal ideation with plan to stab himself.    Within the last four weeks patient has had 4-5 ED visits and 1 psychiatric admission with similar complaints of increased anxiousness, feeling overwhelmed related to urinary issues; and anxiety causing to have suicidal thoughts.  Patient has an appointment with [facility] this week.  Patient has been educated on multiple occassions that his urological  issue is non emergent and to keep his appointment with urology; but patient is fixated on his inability to properly urinate with complaints of retention, weak flow, or the inability to urinate.  Symptoms possibly due to enlarged prostate.    HPI:  Tyler Horn, 60 y.o., male patient seen via tele health by this provider, consulted with Dr. Jola Babinski; and chart reviewed on 11/10/20.  On evaluation Tyler Horn reports he came to the hospital related to feeling anxious and overwhelmed related to his urinary problem.  Patient states he is feeling better this morning and he is now feeling anxious about being in the hospital "I'm feeling closed in and I needed to get out.  I got my appointment with urology the middle of this week and I want to be discharged home.  I just need to get a  prescription for my medicines.  I have a few left but I'm going to need refills."  Patient states he had an appointment with Dr. Evelene Croon this morning at 9:45 but missed it related to being in the hospital and since he missed his appointment he will need medication refills.  Patient denies suicidal/homicidal ideation, psychosis, and paranoia.    During evaluation Tyler Horn is sitting on side of bed in no acute distress.  He is alert, oriented x 4, calm and cooperative.  His mood is anxious with congruent affect.  He does not appear to be responding to internal/external stimuli or delusional thoughts.  Patient denies suicidal/self-harm/homicidal ideation, psychosis, and paranoia.  Patient answered question appropriately.  Patients nurse Waynetta Sandy, RN informs that patient wrote a note asking to be discharged home.    Past Psychiatric History: MDD, anxiety  Risk to Self:  No Risk to Others:  No Prior Inpatient Therapy:  Yes Prior Outpatient Therapy:  Yes  Past Medical History:  Past Medical History:  Diagnosis Date  . Anxiety   . Depression   . HIV (human immunodeficiency virus infection) (HCC)   . HIV (human immunodeficiency virus infection) (HCC)     Past Surgical History:  Procedure Laterality Date  . CATARACT EXTRACTION Left    Family History:  Family History  Problem Relation Age of Onset  . Mental illness Mother   . Bipolar disorder Brother    Family Psychiatric  History: See above Social History:  Social History   Substance and Sexual Activity  Alcohol Use No     Social History   Substance and Sexual  Activity  Drug Use No    Social History   Socioeconomic History  . Marital status: Single    Spouse name: Not on file  . Number of children: Not on file  . Years of education: Not on file  . Highest education level: Not on file  Occupational History  . Not on file  Tobacco Use  . Smoking status: Never Smoker  . Smokeless tobacco: Never Used  Substance and Sexual  Activity  . Alcohol use: No  . Drug use: No  . Sexual activity: Yes  Other Topics Concern  . Not on file  Social History Narrative  . Not on file   Social Determinants of Health   Financial Resource Strain: Not on file  Food Insecurity: Not on file  Transportation Needs: Not on file  Physical Activity: Not on file  Stress: Not on file  Social Connections: Not on file   Additional Social History:    Allergies:  No Known Allergies  Labs: No results found for this or any previous visit (from the past 48 hour(s)).  Medications:  Current Facility-Administered Medications  Medication Dose Route Frequency Provider Last Rate Last Admin  . acetaminophen (TYLENOL) tablet 650 mg  650 mg Oral Q4H PRN Derwood Kaplan, MD      . bictegravir-emtricitabine-tenofovir AF (BIKTARVY) 50-200-25 MG per tablet 1 tablet  1 tablet Oral Daily Derwood Kaplan, MD   1 tablet at 11/10/20 0919  . busPIRone (BUSPAR) tablet 15 mg  15 mg Oral TID Derwood Kaplan, MD   15 mg at 11/10/20 0919  . escitalopram (LEXAPRO) tablet 20 mg  20 mg Oral Daily Rhunette Croft, Ankit, MD   20 mg at 11/10/20 0919  . hydrOXYzine (ATARAX/VISTARIL) tablet 25 mg  25 mg Oral TID PRN Derwood Kaplan, MD   25 mg at 11/09/20 0937  . polyethylene glycol (MIRALAX / GLYCOLAX) packet 17 g  17 g Oral Daily PRN Long, Arlyss Repress, MD   17 g at 11/09/20 1136  . QUEtiapine (SEROQUEL) tablet 100 mg  100 mg Oral QHS Nanavati, Ankit, MD   100 mg at 11/09/20 2216  . QUEtiapine (SEROQUEL) tablet 25 mg  25 mg Oral BID Derwood Kaplan, MD   25 mg at 11/10/20 0919  . risperiDONE (RISPERDAL M-TABS) disintegrating tablet 2 mg  2 mg Oral Q8H PRN Nanavati, Ankit, MD       And  . ziprasidone (GEODON) injection 20 mg  20 mg Intramuscular PRN Nanavati, Ankit, MD      . tamsulosin (FLOMAX) capsule 0.8 mg  0.8 mg Oral Daily Shaune Pollack, Jamison Y, NP   0.8 mg at 11/10/20 7622  . zolpidem (AMBIEN) tablet 5 mg  5 mg Oral QHS PRN Derwood Kaplan, MD       Current Outpatient  Medications  Medication Sig Dispense Refill  . bictegravir-emtricitabine-tenofovir AF (BIKTARVY) 50-200-25 MG TABS tablet Take 1 tablet by mouth daily. 30 tablet 5  . busPIRone (BUSPAR) 15 MG tablet Take 1 tablet (15 mg total) by mouth 3 (three) times daily. 90 tablet 0  . cholecalciferol (VITAMIN D3) 25 MCG (1000 UNIT) tablet Take 1,000 Units by mouth daily.    Marland Kitchen escitalopram (LEXAPRO) 20 MG tablet Take 1 tablet (20 mg total) by mouth daily. 30 tablet 0  . hydrOXYzine (ATARAX/VISTARIL) 25 MG tablet Take 1 tablet (25 mg total) by mouth 3 (three) times daily as needed for anxiety. 30 tablet 0  . Lactobacillus (PROBIOTIC ACIDOPHILUS PO) Take 1 tablet by mouth daily.    Marland Kitchen  Omega-3 Fatty Acids (FISH OIL) 1000 MG CAPS Take 1,000 mg by mouth daily.    . QUEtiapine (SEROQUEL) 100 MG tablet Take 1 tablet (100 mg total) by mouth at bedtime. 30 tablet 0  . QUEtiapine (SEROQUEL) 25 MG tablet Take 1 tablet (25 mg total) by mouth 2 (two) times daily. 30 tablet 1  . tamsulosin (FLOMAX) 0.4 MG CAPS capsule Take 1 capsule (0.4 mg total) by mouth daily. 30 capsule 0  . cephALEXin (KEFLEX) 500 MG capsule Take 1 capsule (500 mg total) by mouth every 8 (eight) hours. (Patient not taking: No sig reported) 12 capsule 0  . ibuprofen (ADVIL) 100 MG tablet Take 100 mg by mouth every 6 (six) hours as needed for fever.    . psyllium (METAMUCIL) 58.6 % packet Take 1 packet by mouth daily.      Musculoskeletal: Strength & Muscle Tone: within normal limits Gait & Station: normal Patient leans: N/A  Psychiatric Specialty Exam: Physical Exam Vitals and nursing note reviewed. Chaperone present: Sitter at bedside.  Constitutional:      General: He is not in acute distress.    Appearance: Normal appearance. He is well-developed. He is not ill-appearing.     Comments: Non-verbal; uses sign language.    Cardiovascular:     Rate and Rhythm: Normal rate.  Pulmonary:     Effort: Pulmonary effort is normal.  Musculoskeletal:         General: Normal range of motion.     Cervical back: Normal range of motion.  Neurological:     Mental Status: He is alert and oriented to person, place, and time.  Psychiatric:        Attention and Perception: Attention and perception normal. He does not perceive auditory or visual hallucinations.        Mood and Affect: Affect normal. Mood is anxious.        Behavior: Behavior normal. Behavior is cooperative.        Thought Content: Thought content normal. Thought content is not paranoid or delusional. Thought content does not include homicidal or suicidal ideation.        Cognition and Memory: Cognition and memory normal.        Judgment: Judgment normal.     Review of Systems  Constitutional: Negative.   HENT: Negative.   Eyes: Negative.   Respiratory: Negative.   Cardiovascular: Negative.   Gastrointestinal: Negative.   Genitourinary: Positive for decreased urine volume.       Complaints of urinary retention and decrease flow.  States he has an appointment with urology this week  Musculoskeletal: Negative.   Skin: Negative.   Neurological: Negative.   Hematological: Negative.   Psychiatric/Behavioral: Negative for agitation, behavioral problems, confusion, self-injury and sleep disturbance. Suicidal ideas: Denies at this time. The patient is nervous/anxious.        Patient reporting his main stressor is his urinary problem and living alone causes him to feel anxious and overwhelmed.  But has an appointment this week to see urology.      Blood pressure 119/87, pulse 82, temperature 97.7 F (36.5 C), temperature source Oral, resp. rate 18, height 6\' 4"  (1.93 m), weight 91.9 kg, SpO2 96 %.Body mass index is 24.66 kg/m.  General Appearance: Casual  Eye Contact:  Good  Speech:  Non-Verbal, Uses sign language.  Interpreter used during assessment  Volume:  None  Mood:  Anxious  Affect:  Congruent  Thought Process:  Coherent, Goal Directed and Descriptions of Associations:  Intact  Orientation:  Full (Time, Place, and Person)  Thought Content:  WDL  Suicidal Thoughts:  No  Homicidal Thoughts:  No  Memory:  Immediate;   Good Recent;   Good  Judgement:  Intact  Insight:  Present  Psychomotor Activity:  Normal  Concentration:  Concentration: Good and Attention Span: Good  Recall:  Good  Fund of Knowledge:  Good  Language:  Good  Akathisia:  No  Handed:  Right  AIMS (if indicated):     Assets:  Communication Skills Desire for Improvement Housing  ADL's:  Intact  Cognition:  WNL  Sleep:      Treatment Plan Summary: Plan Psychiatrically clear.  Behavior health coordinator to assist with outpatient psychiatric services follow up appointment; and current provider to refill medications needed.  Social work/TOC consult ordered to assess any psychosocial needs patient may have and need resources/referrals to be addressed.    Disposition: No evidence of imminent risk to self or others at present.   Patient does not meet criteria for psychiatric inpatient admission. Supportive therapy provided about ongoing stressors. Discussed crisis plan, support from social network, calling 911, coming to the Emergency Department, and calling Suicide Hotline.  This service was provided via telemedicine using a 2-way, interactive audio and video technology.  Names of all persons participating in this telemedicine service and their role in this encounter. Name: Assunta FoundShuvon Rankin Role: NP  Name: Dr. Landry MellowGreg Clary Role: Psychiatrist  Name: Tyler Horn Role: Patient  Name: Dr. Estell HarpinZammit Role: Lucien MonsWL [name] sent a secure message informing:  Patient seen and psychiatrically clear.  Behavior health coordinator to assist with outpatient psychiatric services follow up appointment; and current provider to refill medications needed.  Social work/TOC consult ordered to assess any psychosocial needs patient may have and need resources/referrals to be addressed.      Shuvon Rankin, NP 11/10/2020 11:18  AM

## 2020-11-10 NOTE — ED Notes (Signed)
Pt is alert, calm , cooperative, no s/s of distress. Pt denies SI, HI , A/V hallucinations at this time.

## 2020-11-10 NOTE — ED Notes (Signed)
Pt DC d off unit to home per provider. Pt alert, calm, cooperative, no s/s of distress. DC information given to and reviewed with pt ( interpreter assist at bedside) . Pt acknowledged understanding of DC information. Belongings given to pt. Pt ambulatory off unit, escorted by NT. Pt transported by [employer], set up by SW.

## 2020-11-10 NOTE — Progress Notes (Signed)
TOC CM/CSW consulted with pt.  Pt need is to obtain transportation home.  Pt read and signed Therapist, nutritional. [facility] was called.    CSW will continue to follow for dc needs.  Symon Norwood Tarpley-Carter, MSW, LCSW-A Pronouns:  She, Her, Hers                  [facility] ED Transitions of CareClinical Social Worker [email or web address] [phone]

## 2020-11-10 NOTE — ED Provider Notes (Signed)
Emergency Medicine Observation Re-evaluation Note  Tyler Horn is a 60 y.o. male, seen on rounds today.  Pt initially presented to the ED for complaints of Abdominal Pain and Urinary Retention Currently, the patient is suicidal  Physical Exam  BP 119/87 (BP Location: Left Arm)   Pulse 82   Temp 97.7 F (36.5 C) (Oral)   Resp 18   Ht 6\' 4"  (1.93 m)   Wt 91.9 kg   SpO2 96%   BMI 24.66 kg/m  Physical Exam Alert,  In no acute distress ED Course / MDM  EKG:    I have reviewed the labs performed to date as well as medications administered while in observation.    Plan  Current plan is for psyc inpt    , MD 11/10/20 [phone]

## 2020-11-10 NOTE — Discharge Instructions (Signed)
In the past, you have been seen at [facility] [place], but your service with them has lapsed. For this reason, you are advised to follow up with [facility] as soon as possible:      [facility]      [address]      [place], [place] [address]      [phone]      They offer psychiatry/medication management and therapy.  New patients are seen in their walk-in clinic.  Walk-in hours are Monday - Thursday from 8:00 am - 11:00 am for psychiatry, and Friday from 1:00 pm - 4:00 pm for therapy.  Walk-in patients are seen on a first come, first served basis, so try to arrive as early as possible for the best chance of being seen the same day.  In order to avoid running out of medications you are advised to go for psychiatry this week.

## 2020-11-10 NOTE — ED Notes (Signed)
Patient awake requesting a shower

## 2020-11-10 NOTE — BH Assessment (Signed)
[facility] Assessment Progress Note  Per Shuvon Rankin, NP, this voluntary pt does not require psychiatric hospitalization at this time.  Pt is psychiatrically cleared.  Pt will need to see a psychiatrist this week in order to avoid running out of psychotropic medications.  He has reportedly received outpatient services through [facility] [place] in the past.  At 10:08 this Clinical research associate called [facility].  They report that pt was, in fact, seen there in the past, but after not seeing him for a time, their connection with the pt lapsed.  He would need to go through their intake process, which would start with therapy, in order to receive services including psychiatry.  Discharge instructions therefore advise pt to follow up with [facility] this week in order to avoid running out of psychotropic medications.  A TOC consult has been ordered to address pt's psychosocial needs.  [name] Bethann Berkshire, MD, Ricquita Tarpley-Carter, LCSWA and pt's nurse, Waynetta Sandy, have been notified.  Doylene Canning, MA Triage Specialist [phone]

## 2020-11-10 NOTE — Discharge Instructions (Addendum)
For your behavioral health needs, you have recently been seen at [facility] [place], however, your service with them has lapsed. You are therefore advised to follow up with [facility]:      [facility]      [address]      [place], [place] [address]      [phone]      They offer psychiatry/medication management and therapy.  New patients are being seen in their walk-in clinic.  Walk-in hours are Monday - Thursday from 8:00 am - 11:00 am for psychiatry, and Friday from 1:00 pm - 4:00 pm for therapy.  Walk-in patients are seen on a first come, first served basis, so try to arrive as early as possible for the best chance of being seen the same day.  To be sure that you do not run out of medications, you are advised to go there for psychiatry this week.

## 2020-11-10 NOTE — ED Notes (Signed)
TTS consult completed Patient pacing the room/ however patient appears to be calm

## 2020-11-11 MED ORDER — QUETIAPINE FUMARATE 25 MG PO TABS: 25.0000 mg | ORAL_TABLET | Freq: Two times a day (BID) | ORAL | 0 refills | Status: DC

## 2020-11-11 MED ORDER — BUSPIRONE HCL 15 MG PO TABS
15.0000 mg | ORAL_TABLET | Freq: Three times a day (TID) | ORAL | 0 refills | Status: DC
Start: 2020-11-11 — End: 2020-12-09

## 2020-11-11 MED ORDER — HYDROXYZINE HCL 25 MG PO TABS
25.0000 mg | ORAL_TABLET | Freq: Three times a day (TID) | ORAL | 0 refills | Status: DC | PRN
Start: 2020-11-11 — End: 2020-12-09

## 2020-11-11 MED ORDER — QUETIAPINE FUMARATE 100 MG PO TABS: 100.0000 mg | ORAL_TABLET | Freq: Every day | ORAL | 0 refills | Status: DC

## 2020-11-12 ENCOUNTER — Other Ambulatory Visit: Payer: Self-pay

## 2020-11-12 ENCOUNTER — Emergency Department (HOSPITAL_COMMUNITY)
Admission: EM | Admit: 2020-11-12 | Discharge: 2020-11-12 | Disposition: A | Discharge status: Home / Self Care | Payer: Self-pay | Attending: Emergency Medicine | Admitting: Emergency Medicine

## 2020-11-12 ENCOUNTER — Encounter (HOSPITAL_COMMUNITY): Payer: Self-pay

## 2020-11-12 DIAGNOSIS — F419 Anxiety disorder, unspecified: Secondary | ICD-10-CM | POA: Insufficient documentation

## 2020-11-12 DIAGNOSIS — Z79899 Other long term (current) drug therapy: Secondary | ICD-10-CM | POA: Insufficient documentation

## 2020-11-12 DIAGNOSIS — R300 Dysuria: Secondary | ICD-10-CM | POA: Insufficient documentation

## 2020-11-12 DIAGNOSIS — R3911 Hesitancy of micturition: Secondary | ICD-10-CM | POA: Insufficient documentation

## 2020-11-12 LAB — I-STAT CHEM 8, ED
BUN: 30 mg/dL — ABNORMAL HIGH (ref 6–20)
Calcium, Ion: 0.95 mmol/L — ABNORMAL LOW (ref 1.15–1.40)
Chloride: 106 mmol/L (ref 98–111)
Creatinine, Ser: 0.9 mg/dL (ref 0.61–1.24)
Glucose, Bld: 81 mg/dL (ref 70–99)
HCT: 49 % (ref 39.0–52.0)
Hemoglobin: 16.7 g/dL (ref 13.0–17.0)
Potassium: 3.8 mmol/L (ref 3.5–5.1)
Sodium: 140 mmol/L (ref 135–145)
TCO2: 26 mmol/L (ref 22–32)

## 2020-11-12 LAB — URINALYSIS, ROUTINE W REFLEX MICROSCOPIC
Bacteria, UA: NONE SEEN
Bilirubin Urine: NEGATIVE
Glucose, UA: NEGATIVE mg/dL
Ketones, ur: 5 mg/dL — AB
Leukocytes,Ua: NEGATIVE
Nitrite: NEGATIVE
Protein, ur: NEGATIVE mg/dL
Specific Gravity, Urine: 1.027 (ref 1.005–1.030)
pH: 5 (ref 5.0–8.0)

## 2020-11-12 LAB — POC OCCULT BLOOD, ED: Fecal Occult Bld: NEGATIVE

## 2020-11-12 MED ORDER — LORAZEPAM 1 MG PO TABS
2.0000 mg | ORAL_TABLET | Freq: Once | ORAL | Status: AC
  Administered 2020-11-12: 2 mg via ORAL
  Filled 2020-11-12: qty 2

## 2020-11-12 NOTE — ED Triage Notes (Signed)
EMS reports from home. Pt c/o urinary retention x 3 days.  BP 131/76 HR 90 RR 22 Sp02 100 RA CBG 109 Temp 98.0

## 2020-11-12 NOTE — Discharge Instructions (Signed)
Your prescriptions for seroquel 25 mg and hydroxyzine are available at the pharmacy now. You will have to wait until the 3/24 for your buspar, lexapro, and seroquel 100 mg. Take your other medications as directed. Follow-up with urology tomorrow as scheduled.

## 2020-11-12 NOTE — ED Notes (Signed)
Mother would like a call regarding patient's care [phone]

## 2020-11-12 NOTE — ED Provider Notes (Signed)
[facility]-EMERGENCY DEPT Provider Note   CSN: [id number] Arrival date & time: 11/12/20  1541     History Chief Complaint  Patient presents with  . Urinary Retention    Tyler Horn is a 60 y.o. male.  Patient is a frequent visitor to the ED with urinary complaints. Patient is deaf. History of depression and anxiety. Patient appears anxious at present. He was recently discharged (3/14) after a 3 day stay for suicidal ideation. He was referred to urology for evaluation. He is again reporting difficulty urinating, onset after his recent discharge. Bladder scan performed, 61 cc urine noted in bladder.   Patient given ativan for anxiety. Once he calmed down, I was able to use the interpreter to gain additional information. Patient has an appointment with urology tomorrow at 1500. He states he is out of his anxiety medications. He has flomax at home. Patient states he was unable to obtain his prescriptions from the pharmacy. I contacted his pharmacy, and his hydroxyzine and seroquel 25 mg prescriptions are ready for pick-up. His prescriptions for buspar, lexapro, and seroquel 100 mg are not due for refill until 11/20/20.  Patient also reported occasional rectal bleeding.    Dysuria Presenting symptoms: dysuria   Associated symptoms: urinary hesitation   Associated symptoms: no abdominal pain, no flank pain, no nausea and no vomiting        Past Medical History:  Diagnosis Date  . Anxiety   . Depression   . HIV (human immunodeficiency virus infection) (HCC)   . HIV (human immunodeficiency virus infection) [facility])     Patient Active Problem List   Diagnosis Date Noted  . Vision changes 07/09/2020  . Weight loss 07/09/2020  . Chronic hepatitis C (HCC) 04/27/2020  . Idiopathic peripheral neuropathy 04/27/2020  . Herpes simplex 04/27/2020  . Herpes zoster 04/27/2020  . 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 Post-traumatic stress disorder, unspecified 11/28/2019   . 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 Major depressive disorder, recurrent, severe without psychotic behavior (HCC) 07/13/2017  . Benzodiazepine misuse 07/31/2014  . Human immunodeficiency virus infection (HCC)   . Delusional disorder (HCC) 07/30/2014  . Suicidal thoughts 07/30/2014  . Generalized anxiety disorder 03/26/2013    Past Surgical History:  Procedure Laterality Date  . CATARACT EXTRACTION Left        Family History  Problem Relation Age of Onset  . Mental illness Mother   . Bipolar disorder Brother     Social History   Tobacco Use  . Smoking status: Never Smoker  . Smokeless tobacco: Never Used  Substance Use Topics  . Alcohol use: No  . Drug use: No    Home Medications Prior to Admission medications   Medication Sig Start Date End Date Taking? Authorizing Provider  bictegravir-emtricitabine-tenofovir AF (BIKTARVY) 50-200-25 MG TABS tablet Take 1 tablet by mouth daily. 10/23/20   Veryl Speak, FNP  busPIRone (BUSPAR) 15 MG tablet Take 1 tablet (15 mg total) by mouth 3 (three) times daily. 11/11/20   Veryl Speak, FNP  cholecalciferol (VITAMIN D3) 25 MCG (1000 UNIT) tablet Take 1,000 Units by mouth daily.    [provider]  escitalopram (LEXAPRO) 20 MG tablet Take 1 tablet (20 mg total) by mouth daily. 10/23/20   Antonieta Pert, MD  hydrOXYzine (ATARAX/VISTARIL) 25 MG tablet Take 1 tablet (25 mg total) by mouth 3 (three) times daily as needed for anxiety. 11/11/20   Veryl Speak,  FNP  ibuprofen (ADVIL) 100 MG tablet Take 100 mg by mouth every 6 (six) hours as needed for fever.    [provider]  Lactobacillus (PROBIOTIC ACIDOPHILUS PO) Take 1 tablet by mouth daily.    [provider]  Omega-3 Fatty Acids (FISH OIL) 1000 MG CAPS Take 1,000 mg by mouth daily.    [provider]  psyllium (METAMUCIL) 58.6 % packet Take 1 packet by mouth  daily.    [provider]  QUEtiapine (SEROQUEL) 100 MG tablet Take 1 tablet (100 mg total) by mouth at bedtime. 11/11/20   Veryl Speak, FNP  QUEtiapine (SEROQUEL) 25 MG tablet Take 1 tablet (25 mg total) by mouth 2 (two) times daily. 11/11/20   Veryl Speak, FNP  tamsulosin (FLOMAX) 0.4 MG CAPS capsule Take 1 capsule (0.4 mg total) by mouth daily. 11/04/20   Derwood Kaplan, MD    Allergies    Patient has no known allergies.  Review of Systems   Review of Systems  Gastrointestinal: Negative for abdominal pain, nausea and vomiting.  Genitourinary: Positive for dysuria and hesitancy. Negative for flank pain.  All other systems reviewed and are negative.   Physical Exam Updated Vital Signs BP 131/76   Pulse 90   Temp 98 F (36.7 C) (Oral)   Resp (!) 22   SpO2 100%   Physical Exam Vitals and nursing note reviewed.  HENT:     Head: Normocephalic.     Mouth/Throat:     Mouth: Mucous membranes are moist.  Eyes:     Conjunctiva/sclera: Conjunctivae normal.  Cardiovascular:     Rate and Rhythm: Normal rate.  Pulmonary:     Effort: Pulmonary effort is normal.  Abdominal:     Palpations: Abdomen is soft.  Genitourinary:    Rectum: Normal. Guaiac result negative. No external hemorrhoid.     Comments: Minimal stool in the rectal vault. Hemoccult negative. Musculoskeletal:        General: Normal range of motion.     Cervical back: Normal range of motion.  Skin:    General: Skin is warm and dry.  Neurological:     Mental Status: He is alert and oriented to person, place, and time.  Psychiatric:        Mood and Affect: Mood normal.     ED Results / Procedures / Treatments   Labs (all labs ordered are listed, but only abnormal results are displayed) Labs Reviewed  URINALYSIS, ROUTINE W REFLEX MICROSCOPIC - Abnormal; Notable for the following components:      Result Value   Color, Urine AMBER (*)    Hgb urine dipstick SMALL (*)    Ketones, ur 5 (*)    All  other components within normal limits  I-STAT CHEM 8, ED - Abnormal; Notable for the following components:   BUN 30 (*)    Calcium, Ion 0.95 (*)    All other components within normal limits  OCCULT BLOOD X 1 CARD TO LAB, STOOL  POC OCCULT BLOOD, ED    EKG None  Radiology No results found.  Procedures Procedures   Medications Ordered in ED Medications  LORazepam (ATIVAN) tablet 2 mg (has no administration in time range)    ED Course  I have reviewed the triage vital signs and the nursing notes.  Pertinent labs & imaging results that were available during my care of the patient were reviewed by me and considered in my medical decision making (see chart for details).  MDM Rules/Calculators/A&P                          No indication of urinary retention. Normal H&H, renal functions. Negative hemoccult. Patient is scheduled to see urology tomorrow.    Primary issue seems to be anxiety. Patient is out of several of his anxiety medications (buspar, lexapro, seroquel 100 mg), and those are due to be available for refill on 11/20/20. Two of his medications (hydroxyzine and seroquel 25 mg) are ready at his pharmacy now.  Patient presents to the emergency department complaining of symptoms consistent with anxiety.  Patient has a history of same with similar episodes.  After receiving po ativan in the ED, the patient is resting comfortably, in no apparent distress and asymptomatic.    Final Clinical Impression(s) / ED Diagnoses Final diagnoses:  Anxiety  Urinary hesitancy    Rx / DC Orders ED Discharge Orders    None       Felicie Morn, NP 11/12/20 7026    Mancel Bale, MD 11/13/20 1126

## 2020-11-12 NOTE — ED Notes (Signed)
Patient bladder scanned, 61 mL greatest volume observed. RN made aware.

## 2020-11-13 ENCOUNTER — Telehealth: Payer: Self-pay

## 2020-11-13 NOTE — Telephone Encounter (Signed)
Transition Care Management Unsuccessful Follow-up Telephone Call  Date of discharge and from where:  11/12/20 from [facility]  Attempts:  1st Attempt  Reason for unsuccessful TCM follow-up call:  Left voice message with the use of the ALS avaliable through pt phone carrier

## 2020-11-14 NOTE — Telephone Encounter (Signed)
Transition Care Management Unsuccessful Follow-up Telephone Call  Date of discharge and from where:  11/12/2020 from [facility]   Attempts:  2nd Attempt  Reason for unsuccessful TCM follow-up call:  Left voice message   Use of interpretor provided by pt mobile carrier.

## 2020-11-17 ENCOUNTER — Other Ambulatory Visit (HOSPITAL_COMMUNITY): Payer: Self-pay | Admitting: Psychiatry

## 2020-11-17 ENCOUNTER — Other Ambulatory Visit: Payer: Self-pay

## 2020-11-17 MED ORDER — TAMSULOSIN HCL 0.4 MG PO CAPS: 0.4000 mg | ORAL_CAPSULE | Freq: Every day | ORAL | 0 refills | Status: DC

## 2020-11-17 NOTE — Telephone Encounter (Signed)
Transition Care Management Unsuccessful Follow-up Telephone Call  Date of discharge and from where:  11/12/2020 from [facility]  Attempts:  3rd Attempt  Reason for unsuccessful TCM follow-up call:  Unable to reach patient

## 2020-11-17 NOTE — Telephone Encounter (Signed)
Ok for 1 refill until he sees Urology

## 2020-11-18 ENCOUNTER — Emergency Department (HOSPITAL_COMMUNITY): Payer: Self-pay

## 2020-11-18 ENCOUNTER — Emergency Department (HOSPITAL_COMMUNITY)
Admission: EM | Admit: 2020-11-18 | Discharge: 2020-11-18 | Disposition: A | Discharge status: Home / Self Care | Payer: Self-pay | Attending: Emergency Medicine | Admitting: Emergency Medicine

## 2020-11-18 ENCOUNTER — Encounter (HOSPITAL_COMMUNITY): Payer: Self-pay | Admitting: Radiology

## 2020-11-18 ENCOUNTER — Other Ambulatory Visit: Payer: Self-pay

## 2020-11-18 DIAGNOSIS — R3915 Urgency of urination: Secondary | ICD-10-CM | POA: Insufficient documentation

## 2020-11-18 DIAGNOSIS — R509 Fever, unspecified: Secondary | ICD-10-CM | POA: Insufficient documentation

## 2020-11-18 DIAGNOSIS — R1031 Right lower quadrant pain: Secondary | ICD-10-CM | POA: Insufficient documentation

## 2020-11-18 DIAGNOSIS — Z21 Asymptomatic human immunodeficiency virus [HIV] infection status: Secondary | ICD-10-CM | POA: Insufficient documentation

## 2020-11-18 DIAGNOSIS — R102 Pelvic and perineal pain: Secondary | ICD-10-CM

## 2020-11-18 DIAGNOSIS — R1030 Lower abdominal pain, unspecified: Secondary | ICD-10-CM | POA: Insufficient documentation

## 2020-11-18 LAB — URINALYSIS, ROUTINE W REFLEX MICROSCOPIC
Bilirubin Urine: NEGATIVE
Glucose, UA: NEGATIVE mg/dL
Ketones, ur: NEGATIVE mg/dL
Leukocytes,Ua: NEGATIVE
Nitrite: NEGATIVE
Protein, ur: NEGATIVE mg/dL
Specific Gravity, Urine: 1.028 (ref 1.005–1.030)
pH: 5 (ref 5.0–8.0)

## 2020-11-18 LAB — CBC WITH DIFFERENTIAL/PLATELET
Abs Immature Granulocytes: 0.02 10*3/uL (ref 0.00–0.07)
Basophils Absolute: 0 10*3/uL (ref 0.0–0.1)
Basophils Relative: 1 %
Eosinophils Absolute: 0.2 10*3/uL (ref 0.0–0.5)
Eosinophils Relative: 2 %
HCT: 47.3 % (ref 39.0–52.0)
Hemoglobin: 16.1 g/dL (ref 13.0–17.0)
Immature Granulocytes: 0 %
Lymphocytes Relative: 21 %
Lymphs Abs: 1.5 10*3/uL (ref 0.7–4.0)
MCH: 31.3 pg (ref 26.0–34.0)
MCHC: 34 g/dL (ref 30.0–36.0)
MCV: 92 fL (ref 80.0–100.0)
Monocytes Absolute: 0.4 10*3/uL (ref 0.1–1.0)
Monocytes Relative: 6 %
Neutro Abs: 5 10*3/uL (ref 1.7–7.7)
Neutrophils Relative %: 70 %
Platelets: 288 10*3/uL (ref 150–400)
RBC: 5.14 MIL/uL (ref 4.22–5.81)
RDW: 13.7 % (ref 11.5–15.5)
WBC: 7.1 10*3/uL (ref 4.0–10.5)
nRBC: 0 % (ref 0.0–0.2)

## 2020-11-18 LAB — BASIC METABOLIC PANEL
Anion gap: 9 (ref 5–15)
BUN: 30 mg/dL — ABNORMAL HIGH (ref 6–20)
CO2: 26 mmol/L (ref 22–32)
Calcium: 9.8 mg/dL (ref 8.9–10.3)
Chloride: 107 mmol/L (ref 98–111)
Creatinine, Ser: 1.2 mg/dL (ref 0.61–1.24)
GFR, Estimated: >60 mL/min (ref >60)
Glucose, Bld: 111 mg/dL — ABNORMAL HIGH (ref 70–99)
Potassium: 3.7 mmol/L (ref 3.5–5.1)
Sodium: 142 mmol/L (ref 135–145)

## 2020-11-18 MED ORDER — LORAZEPAM 1 MG PO TABS
1.0000 mg | ORAL_TABLET | Freq: Once | ORAL | Status: AC
  Administered 2020-11-18: 1 mg via ORAL
  Filled 2020-11-18: qty 1

## 2020-11-18 NOTE — ED Notes (Signed)
Bladder scan only showed 24 ml

## 2020-11-18 NOTE — ED Triage Notes (Signed)
Pt complains of urinary retention and is in a lot of pain. Pt is deaf. Wally at bedside

## 2020-11-18 NOTE — Discharge Instructions (Signed)
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  Please follow-up with your urologist at [facility] u[facility] for ongoing evaluation and management.  You may benefit from medication adjustment.  The prescribed oxybutynin should help alleviate your symptoms.  You may need this increased or changed.  If they cannot see you before May, they might at least be able to provide you with additional relief in the interim.  Given your suspicion for possible psychosomatic influence, please also get established with a primary care provider given that this has been an ongoing issue for you and you may benefit from referrals elsewhere.  Please continue to discuss acquiring medical insurance with your case manager.   Return to the ED or seek immediate medical attention should you experience any new or worsening symptoms.

## 2020-11-18 NOTE — ED Provider Notes (Signed)
[facility]-EMERGENCY DEPT Provider Note   CSN: [id number] Arrival date & time: 11/18/20  7001     History No chief complaint on file.   Tyler Horn is a 59 y.o. male who is deaf and with past medical history significant for significant anxiety and depression as well as frequent ED encounters for dysuria in context of known kidney stones who presents the ED with complaints of urinary retention and severe pain.  I reviewed patient's medical record and he has been evaluated here in the ED on multiple occasions, 5 times in the past 6 weeks.  Usually presents for similar symptoms or anxiety.  Prior to my examination, bladder ultrasound performed showed 24 cc urine and Foley catheter was placed.  On my examination, patient states that he has been experiencing considerable suprapubic and right lower quadrant region pain and sense of urgency.  He states that he saw [facility] last week and was given medications, but nothing seems to be working.  He is very frustrated because he does not want to keep coming back to the ER with same complaints.  He states that he has a primary care provider who is aware of his issues.  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  He suspects that it could be his prostate, but also thinks that it could be psychosomatic.  He reports that 2 days ago he had subjective fevers at home.  Patient states that he is frequently constipated, but takes medications and often uses enemas with good effect.  Denies any dysuria, obvious hematuria, nausea or vomiting, current constipation, chest pain or shortness of breath, penile discharge, urinary or fecal incontinence, or other symptoms.  HPI     Past Medical History:  Diagnosis Date  . Anxiety   . Depression   . HIV (human immunodeficiency virus infection) (HCC)   . HIV (human immunodeficiency virus infection) [facility])     Patient Active Problem List   Diagnosis Date Noted  . Vision changes 07/09/2020   . Weight loss 07/09/2020  . Chronic hepatitis C (HCC) 04/27/2020  . Idiopathic peripheral neuropathy 04/27/2020  . Herpes simplex 04/27/2020  . Herpes zoster 04/27/2020  . 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 Post-traumatic stress disorder, unspecified 11/28/2019  . 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 Major depressive disorder, recurrent, severe without psychotic behavior (HCC) 07/13/2017  . Benzodiazepine misuse 07/31/2014  . Human immunodeficiency virus infection (HCC)   . Delusional disorder (HCC) 07/30/2014  . Suicidal thoughts 07/30/2014  . Generalized anxiety disorder 03/26/2013    Past Surgical History:  Procedure Laterality Date  . CATARACT EXTRACTION Left        Family History  Problem Relation Age of Onset  . Mental illness Mother   . Bipolar disorder Brother     Social History   Tobacco Use  . Smoking status: Never Smoker  . Smokeless tobacco: Never Used  Substance Use Topics  . Alcohol use: No  . Drug use: No    Home Medications Prior to Admission medications   Medication Sig Start Date End Date Taking? Authorizing Provider  bictegravir-emtricitabine-tenofovir AF (BIKTARVY) 50-200-25 MG TABS tablet Take 1 tablet by mouth daily. 10/23/20  Yes Veryl Speak, FNP  busPIRone (BUSPAR) 15 MG tablet Take 1 tablet (15 mg total) by mouth 3 (three) times daily. 11/11/20  Yes Veryl Speak, FNP  escitalopram (LEXAPRO) 20 MG tablet Take 1 tablet (20 mg total) by mouth daily.  10/23/20  Yes Antonieta Pert, MD  hydrOXYzine (ATARAX/VISTARIL) 25 MG tablet Take 1 tablet (25 mg total) by mouth 3 (three) times daily as needed for anxiety. 11/11/20  Yes Veryl Speak, FNP  ibuprofen (ADVIL) 200 MG tablet Take 200 mg by mouth every 6 (six) hours as needed for fever or pain.   Yes [provider]  Lactobacillus (PROBIOTIC ACIDOPHILUS PO) Take 1 tablet by mouth daily.   Yes [provider]  Omega-3 Fatty Acids (FISH OIL) 1000 MG CAPS Take 1,000 mg by mouth daily.   Yes [provider]  oxybutynin (DITROPAN-XL) 10 MG 24 hr tablet Take 10 mg by mouth daily. 11/14/20  Yes [provider]  psyllium (METAMUCIL) 58.6 % packet Take 1 packet by mouth daily as needed (fiber supplement).   Yes [provider]  QUEtiapine (SEROQUEL) 100 MG tablet Take 1 tablet (100 mg total) by mouth at bedtime. 11/11/20  Yes Veryl Speak, FNP  QUEtiapine (SEROQUEL) 25 MG tablet Take 1 tablet (25 mg total) by mouth 2 (two) times daily. 11/11/20  Yes Veryl Speak, FNP  tamsulosin (FLOMAX) 0.4 MG CAPS capsule Take 1 capsule (0.4 mg total) by mouth daily. 11/17/20  Yes Veryl Speak, FNP    Allergies    Patient has no known allergies.  Review of Systems   Review of Systems  All other systems reviewed and are negative.   Physical Exam Updated Vital Signs BP (!) 118/98 (BP Location: Left Arm)   Pulse (!) 114   Temp 98.7 F (37.1 C) (Oral)   Resp 18   SpO2 97%   Physical Exam Vitals and nursing note reviewed. Exam conducted with a chaperone present.  Constitutional:      Appearance: Normal appearance.  HENT:     Head: Normocephalic and atraumatic.  Eyes:     General: No scleral icterus.    Conjunctiva/sclera: Conjunctivae normal.  Cardiovascular:     Rate and Rhythm: Normal rate.  Pulmonary:     Effort: Pulmonary effort is normal.  Abdominal:     General: Abdomen is flat.     Palpations: Abdomen is soft.     Tenderness: There is abdominal tenderness.     Comments: Soft, nondistended.  TTP in RLQ and mild TTP over suprapubic region.  No significant tenderness elsewhere.  No overlying skin changes.  Negative CVAT bilaterally.  Musculoskeletal:        General: Normal range of motion.  Skin:    General: Skin is dry.  Neurological:     Mental Status: He is alert and oriented to person, place, and time.     GCS: GCS eye subscore is 4. GCS  verbal subscore is 5. GCS motor subscore is 6.  Psychiatric:        Mood and Affect: Mood normal.        Behavior: Behavior normal.        Thought Content: Thought content normal.     ED Results / Procedures / Treatments   Labs (all labs ordered are listed, but only abnormal results are displayed) Labs Reviewed  BASIC METABOLIC PANEL - Abnormal; Notable for the following components:      Result Value   Glucose, Bld 111 (*)    BUN 30 (*)    All other components within normal limits  URINALYSIS, ROUTINE W REFLEX MICROSCOPIC - Abnormal; Notable for the following components:   Hgb urine dipstick SMALL (*)    Bacteria, UA RARE (*)  All other components within normal limits  URINE CULTURE  CBC WITH DIFFERENTIAL/PLATELET    EKG None  Radiology No results found.  Procedures Procedures   Medications Ordered in ED Medications  LORazepam (ATIVAN) tablet 1 mg (1 mg Oral Given 11/18/20 1055)    ED Course  I have reviewed the triage vital signs and the nursing notes.  Pertinent labs & imaging results that were available during my care of the patient were reviewed by me and considered in my medical decision making (see chart for details).    MDM Rules/Calculators/A&P                          Tyler Horn was evaluated in Emergency Department on 11/18/2020 for the symptoms described in the history of present illness. He was evaluated in the context of the global COVID-19 pandemic, which necessitated consideration that the patient might be at risk for infection with the SARS-CoV-2 virus that causes COVID-19. Institutional protocols and algorithms that pertain to the evaluation of patients at risk for COVID-19 are in a state of rapid change based on information released by regulatory bodies including the CDC and federal and state organizations. These policies and algorithms were followed during the patient's care in the ED.  I personally reviewed patient's medical chart and all notes  from triage and staff during today's encounter. I have also ordered and reviewed all labs and imaging that I felt to be medically necessary in the evaluation of this patient's complaints and with consideration of their physical exam. If needed, translation services were available and utilized.   Patient is frustrated because he feels as though his appointment and new medications prescribed from [facility] has not helped alleviate his symptoms of urinary urgency and suprapubic discomfort.  He states that his pain came on abruptly this morning and described as a spasm, suggestive of detrusor contractions.  He was recently prescribed 10 mg oxybutynin which should help mitigate those symptoms.  However, if he is continuing to experience symptoms, he will need to follow-up with them for possible medication adjustment for further evaluation/management.  Patient voices understanding.  Will obtain work-up here in the ED however to rule out infection or obstructing ureterolithiasis.  Patient's work-up is entirely unremarkable.  CT renal stone study notable for tiny bilateral renal calculi, but no hydronephrosis or other acute findings.  Appendix is visualized and unremarkable.  Labs are also unremarkable.  Patient also suspect that there may be psychosomatic component.  Encouraging him to follow-up with his primary care provider regarding this concern and referral elsewhere as needed.  No emergent pathology.  Patient understands, all of these findings communicated via ASL interpreter.  ED return precautions discussed.  Final Clinical Impression(s) / ED Diagnoses Final diagnoses:  Suprapubic pain  Urinary urgency    Rx / DC Orders ED Discharge Orders    None       Lorelee New, PA-C 11/18/20 1258    Koleen Distance, MD 11/19/20 [phone]

## 2020-11-18 NOTE — ED Triage Notes (Signed)
Pt from home, lives alone and complains of urinary pain. States he is in a lot of pain. Pt is deaf

## 2020-11-19 LAB — URINE CULTURE: Culture: NO GROWTH

## 2020-11-20 ENCOUNTER — Emergency Department (HOSPITAL_COMMUNITY)
Admission: EM | Admit: 2020-11-20 | Discharge: 2020-11-20 | Disposition: A | Discharge status: Home / Self Care | Payer: Self-pay | Attending: Emergency Medicine | Admitting: Emergency Medicine

## 2020-11-20 ENCOUNTER — Other Ambulatory Visit: Payer: Self-pay

## 2020-11-20 ENCOUNTER — Encounter (HOSPITAL_COMMUNITY): Payer: Self-pay

## 2020-11-20 DIAGNOSIS — R130 Aphagia: Secondary | ICD-10-CM | POA: Insufficient documentation

## 2020-11-20 DIAGNOSIS — Z21 Asymptomatic human immunodeficiency virus [HIV] infection status: Secondary | ICD-10-CM | POA: Insufficient documentation

## 2020-11-20 DIAGNOSIS — R3915 Urgency of urination: Secondary | ICD-10-CM | POA: Insufficient documentation

## 2020-11-20 DIAGNOSIS — R339 Retention of urine, unspecified: Secondary | ICD-10-CM | POA: Insufficient documentation

## 2020-11-20 LAB — URINALYSIS, ROUTINE W REFLEX MICROSCOPIC
Bacteria, UA: NONE SEEN
Bilirubin Urine: NEGATIVE
Glucose, UA: NEGATIVE mg/dL
Ketones, ur: NEGATIVE mg/dL
Leukocytes,Ua: NEGATIVE
Nitrite: NEGATIVE
Protein, ur: NEGATIVE mg/dL
Specific Gravity, Urine: 1.031 — ABNORMAL HIGH (ref 1.005–1.030)
pH: 5 (ref 5.0–8.0)

## 2020-11-20 NOTE — ED Triage Notes (Signed)
Pt states no urination since Tuesday night, patient states urinary retention/pressure with hx. Wall-E used for ASL.

## 2020-11-20 NOTE — ED Provider Notes (Signed)
[facility]-EMERGENCY DEPT Provider Note   CSN: [id number] Arrival date & time: 11/20/20  1258     History Chief Complaint  Patient presents with  . Urinary Retention    Tyler Horn is a 60 y.o. male.  60 year old male returns to the ER with ongoing urinary urgency and inability to void. Patient is deaf, an interpreter is used for his evalution today. Patient reports inability to void since Tuesday night (now Thursday afternoon). History of multiple similar visits previously. Patient was seen by urology and given medications which he is taking without relief. Also reports compliance with his anxiety medications which he refilled today and has taken. Patient feels like his mouth is dry, he is afraid to drink anything due to fear of inability to void later. Reports enema earlier today with satisfactory bowel movement. Patient asks if his symptoms could be psychogenic. Requesting Ativan for anxiety.         Past Medical History:  Diagnosis Date  . Anxiety   . Depression   . HIV (human immunodeficiency virus infection) (HCC)   . HIV (human immunodeficiency virus infection) [facility])     Patient Active Problem List   Diagnosis Date Noted  . Vision changes 07/09/2020  . Weight loss 07/09/2020  . Chronic hepatitis C (HCC) 04/27/2020  . Idiopathic peripheral neuropathy 04/27/2020  . Herpes simplex 04/27/2020  . Herpes zoster 04/27/2020  . 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 Post-traumatic stress disorder, unspecified 11/28/2019  . 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 Major depressive disorder, recurrent, severe without psychotic behavior (HCC) 07/13/2017  . Benzodiazepine misuse 07/31/2014  . Human immunodeficiency virus infection (HCC)   . Delusional disorder (HCC) 07/30/2014  . Suicidal thoughts 07/30/2014  . Generalized anxiety disorder 03/26/2013    Past Surgical History:   Procedure Laterality Date  . CATARACT EXTRACTION Left        Family History  Problem Relation Age of Onset  . Mental illness Mother   . Bipolar disorder Brother     Social History   Tobacco Use  . Smoking status: Never Smoker  . Smokeless tobacco: Never Used  Substance Use Topics  . Alcohol use: No  . Drug use: No    Home Medications Prior to Admission medications   Medication Sig Start Date End Date Taking? Authorizing Provider  bictegravir-emtricitabine-tenofovir AF (BIKTARVY) 50-200-25 MG TABS tablet Take 1 tablet by mouth daily. 10/23/20   Veryl Speak, FNP  busPIRone (BUSPAR) 15 MG tablet Take 1 tablet (15 mg total) by mouth 3 (three) times daily. 11/11/20   Veryl Speak, FNP  escitalopram (LEXAPRO) 20 MG tablet Take 1 tablet (20 mg total) by mouth daily. 10/23/20   Antonieta Pert, MD  hydrOXYzine (ATARAX/VISTARIL) 25 MG tablet Take 1 tablet (25 mg total) by mouth 3 (three) times daily as needed for anxiety. 11/11/20   Veryl Speak, FNP  ibuprofen (ADVIL) 200 MG tablet Take 200 mg by mouth every 6 (six) hours as needed for fever or pain.    [provider]  Lactobacillus (PROBIOTIC ACIDOPHILUS PO) Take 1 tablet by mouth daily.    [provider]  Omega-3 Fatty Acids (FISH OIL) 1000 MG CAPS Take 1,000 mg by mouth daily.    [provider]  oxybutynin (DITROPAN-XL) 10 MG 24 hr tablet Take 10 mg by mouth daily. 11/14/20   [provider]  psyllium (METAMUCIL) 58.6 % packet Take 1 packet  by mouth daily as needed (fiber supplement).    [provider]  QUEtiapine (SEROQUEL) 100 MG tablet Take 1 tablet (100 mg total) by mouth at bedtime. 11/11/20   Veryl Speak, FNP  QUEtiapine (SEROQUEL) 25 MG tablet Take 1 tablet (25 mg total) by mouth 2 (two) times daily. 11/11/20   Veryl Speak, FNP  tamsulosin (FLOMAX) 0.4 MG CAPS capsule Take 1 capsule (0.4 mg total) by mouth daily. 11/17/20   Veryl Speak, FNP     Allergies    Patient has no known allergies.  Review of Systems   Review of Systems  Constitutional: Negative for fever.  Gastrointestinal: Negative for abdominal distention, abdominal pain, nausea and vomiting.  Genitourinary: Positive for difficulty urinating. Negative for dysuria.  Musculoskeletal: Negative for back pain.  Skin: Negative for rash and wound.  Allergic/Immunologic: Positive for immunocompromised state.    Physical Exam Updated Vital Signs BP (!) 141/117   Pulse (!) 115   Temp 98.4 F (36.9 C) (Oral)   Resp 18   Ht 6\' 4"  (1.93 m)   Wt 91 kg   SpO2 97%   BMI 24.42 kg/m   Physical Exam Vitals and nursing note reviewed.  Constitutional:      General: He is not in acute distress.    Appearance: He is well-developed. He is not diaphoretic.  HENT:     Head: Normocephalic and atraumatic.  Cardiovascular:     Pulses: Normal pulses.  Pulmonary:     Effort: Pulmonary effort is normal.  Abdominal:     Palpations: Abdomen is soft.     Tenderness: There is no abdominal tenderness.  Musculoskeletal:     Right lower leg: No edema.     Left lower leg: No edema.  Skin:    General: Skin is warm and dry.     Findings: No erythema or rash.  Neurological:     Mental Status: He is alert and oriented to person, place, and time.  Psychiatric:        Behavior: Behavior normal.     ED Results / Procedures / Treatments   Labs (all labs ordered are listed, but only abnormal results are displayed) Labs Reviewed  URINALYSIS, ROUTINE W REFLEX MICROSCOPIC - Abnormal; Notable for the following components:      Result Value   Specific Gravity, Urine 1.031 (*)    Hgb urine dipstick SMALL (*)    All other components within normal limits    EKG None  Radiology No results found.  Procedures Procedures   Medications Ordered in ED Medications - No data to display  ED Course  I have reviewed the triage vital signs and the nursing notes.  Pertinent labs &  imaging results that were available during my care of the patient were reviewed by me and considered in my medical decision making (see chart for details).  Clinical Course as of 11/20/20 1402  Thu Nov 20, 2020  2781 60 year old male with concern for recurrent urinary retention. ASL interpreter assists with history and exam today. Prior chart reviewed.  With frequent ER visits with concern for retention however on bladder scan and subsequent in and out catheter found to have very small amounts of urine in the bladder.  Today, patient is found to have 126 mL of urine, consistent with his I&O catheter.  No evidence of infection on sample. Patient is frustrated with this recurrent problem, states he is not scheduled to see his urologist for another  week and he is tired of coming back to the emergency room.  Explained to the patient that the urologist is not physically here in the emergency room to see him.  Offered possible Foley catheter with leg bag with urology follow-up, discussed potential for infection, pain, difficulty with retention after removal of a Foley catheter with the patient.  Patient has declined this option today. I asked the patient if there is anything else I could do to help him that he could think of, at this time he states that he would like an Ativan tablet to take as soon as he gets home and would like to be discharged.  Advised patient that I cannot discharge him with a tablet however recommend that he call urology or present to the office if communication presents barriers for him to be seen sooner.  [LM]    Clinical Course User Index [LM] Alden Hipp   MDM Rules/Calculators/A&P                         Final Clinical Impression(s) / ED Diagnoses Final diagnoses:  Urinary urgency    Rx / DC Orders ED Discharge Orders    None       Jeannie Fend, PA-C 11/20/20 1402    Mancel Bale, MD 11/20/20 [phone]

## 2020-11-20 NOTE — ED Notes (Signed)
Bladder scan performed with 

## 2020-11-20 NOTE — ED Notes (Signed)
PA offered Foley with leg bag and patient refused wanting a ativan to go home with. PA refused to send patient home with ativan and patients states he is ready to go. D/C paperwork reviewed

## 2020-11-20 NOTE — Discharge Instructions (Addendum)
Go to or call your urologist office.

## 2020-11-20 NOTE — ED Notes (Signed)
Hx obtained by ASL interpreter. States he is having suprapubic pressure and pain and states the last time he urinated was 11/18/20. Per ASL interpreter, pt has a hx of urinary retention and states he last had a foley 11/17/20.

## 2020-11-20 NOTE — ED Notes (Signed)
 out with in and out cath. Patient states he has little relief. PA in room

## 2020-11-23 ENCOUNTER — Encounter (HOSPITAL_COMMUNITY): Payer: Self-pay | Admitting: Emergency Medicine

## 2020-11-23 ENCOUNTER — Emergency Department (HOSPITAL_COMMUNITY)
Admission: EM | Admit: 2020-11-23 | Discharge: 2020-11-23 | Disposition: A | Discharge status: Home / Self Care | Payer: Self-pay | Attending: Emergency Medicine | Admitting: Emergency Medicine

## 2020-11-23 DIAGNOSIS — R1030 Lower abdominal pain, unspecified: Secondary | ICD-10-CM | POA: Insufficient documentation

## 2020-11-23 DIAGNOSIS — Z21 Asymptomatic human immunodeficiency virus [HIV] infection status: Secondary | ICD-10-CM | POA: Insufficient documentation

## 2020-11-23 DIAGNOSIS — R3915 Urgency of urination: Secondary | ICD-10-CM | POA: Insufficient documentation

## 2020-11-23 LAB — URINALYSIS, ROUTINE W REFLEX MICROSCOPIC
Bacteria, UA: NONE SEEN
Bilirubin Urine: NEGATIVE
Glucose, UA: NEGATIVE mg/dL
Ketones, ur: NEGATIVE mg/dL
Leukocytes,Ua: NEGATIVE
Nitrite: NEGATIVE
Protein, ur: NEGATIVE mg/dL
Specific Gravity, Urine: 1.023 (ref 1.005–1.030)
pH: 6 (ref 5.0–8.0)

## 2020-11-23 LAB — COMPREHENSIVE METABOLIC PANEL
ALT: 11 U/L (ref 0–44)
AST: 15 U/L (ref 15–41)
Albumin: 4.2 g/dL (ref 3.5–5.0)
Alkaline Phosphatase: 83 U/L (ref 38–126)
Anion gap: 8 (ref 5–15)
BUN: 21 mg/dL — ABNORMAL HIGH (ref 6–20)
CO2: 27 mmol/L (ref 22–32)
Calcium: 9 mg/dL (ref 8.9–10.3)
Chloride: 108 mmol/L (ref 98–111)
Creatinine, Ser: 0.99 mg/dL (ref 0.61–1.24)
GFR, Estimated: >60 mL/min (ref >60)
Glucose, Bld: 98 mg/dL (ref 70–99)
Potassium: 3.8 mmol/L (ref 3.5–5.1)
Sodium: 143 mmol/L (ref 135–145)
Total Bilirubin: 0.9 mg/dL (ref 0.3–1.2)
Total Protein: 7.3 g/dL (ref 6.5–8.1)

## 2020-11-23 LAB — CBC
HCT: 43.3 % (ref 39.0–52.0)
Hemoglobin: 15 g/dL (ref 13.0–17.0)
MCH: 31.3 pg (ref 26.0–34.0)
MCHC: 34.6 g/dL (ref 30.0–36.0)
MCV: 90.4 fL (ref 80.0–100.0)
Platelets: 277 10*3/uL (ref 150–400)
RBC: 4.79 MIL/uL (ref 4.22–5.81)
RDW: 13.7 % (ref 11.5–15.5)
WBC: 7.2 10*3/uL (ref 4.0–10.5)
nRBC: 0 % (ref 0.0–0.2)

## 2020-11-23 MED ORDER — LORAZEPAM 2 MG/ML IJ SOLN
0.5000 mg | Freq: Once | INTRAMUSCULAR | Status: DC
  Filled 2020-11-23: qty 1

## 2020-11-23 MED ORDER — LORAZEPAM 0.5 MG PO TABS
0.5000 mg | ORAL_TABLET | Freq: Once | ORAL | Status: AC
  Administered 2020-11-23: 0.5 mg via ORAL
  Filled 2020-11-23: qty 1

## 2020-11-23 MED ORDER — OXYBUTYNIN CHLORIDE 5 MG PO TABS
5.0000 mg | ORAL_TABLET | Freq: Three times a day (TID) | ORAL | Status: DC
  Administered 2020-11-23: 5 mg via ORAL
  Filled 2020-11-23: qty 1

## 2020-11-23 NOTE — Discharge Instructions (Addendum)
Please call [facility] tomorrow and inform them that you had a urinary catheter placed here in the ER and need to be seen in the office sooner.  Return to the ER for any new or worsening symptoms.

## 2020-11-23 NOTE — ED Notes (Signed)
Foley placed 150 ml obtained. Pt tolerated well

## 2020-11-23 NOTE — ED Notes (Signed)
Pt states he has been unable to use the restroom since yesterday even with active trying. He states there has been absolutely no Urinary output or dribbling. Pt states he is also having associated severe burning in his penis. Pt denies nausea , vomiting, denies genital trauma. Pt states he had this happen once before a week ago and he had a foley placed. Pt states he met with a urologist two weeks ago he was given some medication and told there was nothing further to be done at this time. He was given the earliest appointment of may 5th.

## 2020-11-23 NOTE — ED Notes (Signed)
Pt states he needed to use the restroom prior to discharge. Pt ambulated to restroom without incident

## 2020-11-23 NOTE — ED Notes (Signed)
graham crackers, gingerale and apple sauce provided to pt.

## 2020-11-23 NOTE — ED Notes (Signed)
discharge instructions provided using [facility] for tele interpreting. Pt voiced understanding. Pt states he will go to urology tomorrow to follow up. Pt was educated on use of urinary leg bag.

## 2020-11-23 NOTE — ED Notes (Signed)
Bladder scan by nurse tech revealed 69ml, done twice

## 2020-11-23 NOTE — ED Notes (Signed)
Bladder scan 51ml per triage tech.

## 2020-11-23 NOTE — ED Triage Notes (Signed)
Patient in from home reporting urinary retention, unable to urinate since last night. States that this has been an ongoing issue. Pressure to abd and pelvis. Sign Language.

## 2020-11-23 NOTE — ED Provider Notes (Signed)
[facility]-EMERGENCY DEPT Provider Note   CSN: [id number] Arrival date & time: 11/23/20  8882     History Chief Complaint  Patient presents with  . Urinary Retention    Tyler Horn is a 60 y.o. male.  HPI 60 year old male with a history of anxiety, depression, HIV, major depressive disorder, delusional disorder, GAD presents to the ER with complaints of "urinary retention".  History provided by sign language interpreter.  States he has not urinated in the last 24 hours.  He feels as though there is something "stuck" in his penis, though denies putting anything in there.  Reports burning.  Patient has been seen here multiple times with similar complaints, as recently as 3/24.    Past Medical History:  Diagnosis Date  . Anxiety   . Depression   . HIV (human immunodeficiency virus infection) (HCC)   . HIV (human immunodeficiency virus infection) [facility])     Patient Active Problem List   Diagnosis Date Noted  . Vision changes 07/09/2020  . Weight loss 07/09/2020  . Chronic hepatitis C (HCC) 04/27/2020  . Idiopathic peripheral neuropathy 04/27/2020  . Herpes simplex 04/27/2020  . Herpes zoster 04/27/2020  . 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 Post-traumatic stress disorder, unspecified 11/28/2019  . 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 Major depressive disorder, recurrent, severe without psychotic behavior (HCC) 07/13/2017  . Benzodiazepine misuse 07/31/2014  . Human immunodeficiency virus infection (HCC)   . Delusional disorder (HCC) 07/30/2014  . Suicidal thoughts 07/30/2014  . Generalized anxiety disorder 03/26/2013    Past Surgical History:  Procedure Laterality Date  . CATARACT EXTRACTION Left        Family History  Problem Relation Age of Onset  . Mental illness Mother   . Bipolar disorder Brother     Social History   Tobacco Use  . Smoking status: Never Smoker   . Smokeless tobacco: Never Used  Substance Use Topics  . Alcohol use: No  . Drug use: No    Home Medications Prior to Admission medications   Medication Sig Start Date End Date Taking? Authorizing Provider  bictegravir-emtricitabine-tenofovir AF (BIKTARVY) 50-200-25 MG TABS tablet Take 1 tablet by mouth daily. 10/23/20   Veryl Speak, FNP  busPIRone (BUSPAR) 15 MG tablet Take 1 tablet (15 mg total) by mouth 3 (three) times daily. 11/11/20   Veryl Speak, FNP  escitalopram (LEXAPRO) 20 MG tablet Take 1 tablet (20 mg total) by mouth daily. 10/23/20   Antonieta Pert, MD  hydrOXYzine (ATARAX/VISTARIL) 25 MG tablet Take 1 tablet (25 mg total) by mouth 3 (three) times daily as needed for anxiety. 11/11/20   Veryl Speak, FNP  ibuprofen (ADVIL) 200 MG tablet Take 200 mg by mouth every 6 (six) hours as needed for fever or pain.    [provider]  Lactobacillus (PROBIOTIC ACIDOPHILUS PO) Take 1 tablet by mouth daily.    [provider]  Omega-3 Fatty Acids (FISH OIL) 1000 MG CAPS Take 1,000 mg by mouth daily.    [provider]  oxybutynin (DITROPAN-XL) 10 MG 24 hr tablet Take 10 mg by mouth daily. 11/14/20   [provider]  psyllium (METAMUCIL) 58.6 % packet Take 1 packet by mouth daily as needed (fiber supplement).    [provider]  QUEtiapine (SEROQUEL) 100 MG tablet Take 1 tablet (100 mg total) by mouth at bedtime. 11/11/20   Veryl Speak, FNP  QUEtiapine (SEROQUEL)  25 MG tablet Take 1 tablet (25 mg total) by mouth 2 (two) times daily. 11/11/20   Veryl Speak, FNP  tamsulosin (FLOMAX) 0.4 MG CAPS capsule Take 1 capsule (0.4 mg total) by mouth daily. 11/17/20   Veryl Speak, FNP    Allergies    Patient has no known allergies.  Review of Systems   Review of Systems  Constitutional: Negative for chills and fever.  HENT: Negative for ear pain and sore throat.   Eyes: Negative for pain and visual disturbance.   Respiratory: Negative for cough and shortness of breath.   Cardiovascular: Negative for chest pain and palpitations.  Gastrointestinal: Negative for abdominal pain and vomiting.  Genitourinary: Positive for decreased urine volume, difficulty urinating, dysuria and urgency. Negative for flank pain, frequency, hematuria, penile discharge, penile pain and scrotal swelling.  Musculoskeletal: Negative for arthralgias and back pain.  Skin: Negative for color change and rash.  Neurological: Negative for seizures and syncope.  All other systems reviewed and are negative.   Physical Exam Updated Vital Signs BP 116/79   Pulse 75   Temp 98.5 F (36.9 C) (Oral)   Resp 19   SpO2 100%   Physical Exam Vitals and nursing note reviewed.  Constitutional:      Appearance: He is well-developed.  HENT:     Head: Normocephalic and atraumatic.  Eyes:     Conjunctiva/sclera: Conjunctivae normal.  Cardiovascular:     Rate and Rhythm: Normal rate and regular rhythm.     Heart sounds: No murmur heard.   Pulmonary:     Effort: Pulmonary effort is normal. No respiratory distress.     Breath sounds: Normal breath sounds.  Abdominal:     Palpations: Abdomen is soft.     Tenderness: There is no abdominal tenderness.     Comments: Mild tenderness of the suprapubic area  Genitourinary:    Comments: GU exam performed with nursing at bedside.  No visible erythema around the urethra, no lesions, scrotum nontender, no epididymal tenderness, no swelling, cremasteric reflex intact Musculoskeletal:     Cervical back: Neck supple.  Skin:    General: Skin is warm and dry.  Neurological:     Mental Status: He is alert.     ED Results / Procedures / Treatments   Labs (all labs ordered are listed, but only abnormal results are displayed) Labs Reviewed  COMPREHENSIVE METABOLIC PANEL - Abnormal; Notable for the following components:      Result Value   BUN 21 (*)    All other components within normal  limits  URINALYSIS, ROUTINE W REFLEX MICROSCOPIC - Abnormal; Notable for the following components:   Hgb urine dipstick MODERATE (*)    All other components within normal limits  CBC  GC/CHLAMYDIA PROBE AMP ([place]) NOT AT [facility]    EKG None  Radiology No results found.  Procedures Procedures   Medications Ordered in ED Medications  oxybutynin (DITROPAN) tablet 5 mg (5 mg Oral Given 11/23/20 1128)  LORazepam (ATIVAN) tablet 0.5 mg (0.5 mg Oral Given 11/23/20 1128)    ED Course  I have reviewed the triage vital signs and the nursing notes.  Pertinent labs & imaging results that were available during my care of the patient were reviewed by me and considered in my medical decision making (see chart for details).    MDM Rules/Calculators/A&P  60 year old male who presents with suprapubic pain, reported urinary retention, burning to the urethra.  On arrival, he is writhing in pain, asking that we put in a catheter.  Bedside bladder scan shows 44 mils of urine.  No flank tenderness on exam, mild suprapubic tenderness noted.  Vitals reassuring, afebrile.  CBC and CMP largely unremarkable.  UA with moderate hemoglobin, no evidence of infection.  GC chlamydia pending.  Hematuria consistent from prior UAs.  Patient was given some oxybutynin, Ativan as there is concern for possible urethral spasm.  As soon as catheter was placed, though patient did not produce a significant bout of urine, he states he felt immediately better.  Question possible psychosomatic element of this as well.  No evidence of acute abdominal surgical emergency, low suspicion for perforated bladder, low suspicion for kidney stone, pyelonephritis.  Will discharge with Foley catheter given patient has had multiple visits to the ER with the same complaint. Already taking oxybutynin.  He was instructed to call [facility] with whom he has a pending appointment in May, and inform them that he has a  Foley catheter in place he needs to be seen sooner as per discussion with Dr. Estell Harpin.  He voiced understanding and is agreeable.  Return precautions discussed.  Stable for discharge.  Final Clinical Impression(s) / ED Diagnoses Final diagnoses:  Urinary urgency    Rx / DC Orders ED Discharge Orders    None       Leone Brand 11/23/20 1241    Bethann Berkshire, MD 11/25/20 [phone]

## 2020-11-28 ENCOUNTER — Telehealth: Payer: Self-pay | Admitting: *Deleted

## 2020-11-28 NOTE — Telephone Encounter (Signed)
Copied from CRM [id number]. Topic: General - Other >> Nov 18, 2020  1:50 PM Leafy Ro wrote: Reason for CRM: Pt has an appt with angela on 12-31-2020 and would like to apply for financial assistant or orange card   Called patient and LVMx2 advising him to call [phone] to get scheduled for financial. If patient calls back and appt is unable to be scheduled at time of call please advise patient that he can pick up the financial packet the day of his HFU with Marylene Land and we can schedule him an appt to meet with financial advisor when he is in the office.

## 2020-12-07 ENCOUNTER — Other Ambulatory Visit: Payer: Self-pay | Admitting: Family

## 2020-12-09 ENCOUNTER — Ambulatory Visit (HOSPITAL_COMMUNITY): Payer: No Payment, Other | Admitting: Physician Assistant

## 2020-12-09 ENCOUNTER — Ambulatory Visit (HOSPITAL_COMMUNITY): Payer: No Payment, Other | Admitting: Family

## 2020-12-09 ENCOUNTER — Other Ambulatory Visit: Payer: Self-pay

## 2020-12-09 VITALS — BP 110/93 | HR 86 | Ht 76.0 in | Wt 165.0 lb

## 2020-12-09 DIAGNOSIS — F332 Major depressive disorder, recurrent severe without psychotic features: Secondary | ICD-10-CM

## 2020-12-09 MED ORDER — ESCITALOPRAM OXALATE 20 MG PO TABS: 20.0000 mg | ORAL_TABLET | Freq: Every day | ORAL | 0 refills | Status: DC

## 2020-12-09 MED ORDER — QUETIAPINE FUMARATE 100 MG PO TABS: 100.0000 mg | ORAL_TABLET | Freq: Every day | ORAL | 0 refills | Status: DC

## 2020-12-09 MED ORDER — QUETIAPINE FUMARATE 50 MG PO TABS: 25.0000 mg | ORAL_TABLET | Freq: Two times a day (BID) | ORAL | 0 refills | Status: DC

## 2020-12-09 MED ORDER — CLONAZEPAM 0.5 MG PO TABS: 0.2500 mg | ORAL_TABLET | Freq: Two times a day (BID) | ORAL | 0 refills | Status: DC | PRN

## 2020-12-09 NOTE — Progress Notes (Unsigned)
Psychiatric Initial Adult Assessment   Patient Identification: Tyler Horn MRN:  [id number] Date of Evaluation:  12/09/2020 Referral Source: Chief Complaint:   Chief Complaint    med eval     Visit Diagnosis:    ICD-10-CM   1. Severe episode of recurrent major depressive disorder, without psychotic features (HCC)  F33.2     History of Present Illness:  Tyler Horn 53 Caucasian male presents for depression and anxiety.  Translation provided by ASL. -Deaf.  reported stressors is declining physical health.  He reports he was recently followed by urology and has a Foley catheter due to inability to urinate.  States he gets racing thoughts and worsening panic attacks as this relates to urination.  Reports he had a recent inpatient admission due to anxiety and passive suicidal ideations.  States during his inpatient admissions he was initiated on Seroquel 100 mg p.o. nightly and 25 mg p.o. twice daily for anxiety and depression.  Lexapro 20 mg p.o. daily.  BuSpar 15 mg p.o. twice daily which he reports has not been helpful with his anxiety.  Has requested to discontinue this medication.  Patient reports he is currently followed by therapist, Sated that his next follow-up appointment on the [facility].  Denied that he is followed by psychiatry currently.  Patient reports ongoing ruminations, depression and paranoia.  Reported he has been taking his medication for the past 3 weeks without immediate relief and is seeking clonazepam for panic symptoms.  Denies illicit drug use or alcohol.  Denying suicidal or homicidal ideations.  Denies auditory or visual hallucinations.  Will initiate clonazepam 0.25 milligrams p.o. twice daily as needed x10 tablets.  Increase Seroquel 25 mg to 50 mg p.o. twice daily and continue Seroquel 100 mg p.o. nightly.  Discontinued BuSpar 15 mg.  Continue Lexapro 20 mg p.o. daily.  Associated Signs/Symptoms: Depression Symptoms:  depressed mood, feelings of  worthlessness/guilt, difficulty concentrating, anxiety, (Hypo) Manic Symptoms:  Distractibility, Irritable Mood, Anxiety Symptoms:  Excessive Worry, Social Anxiety, Psychotic Symptoms:  Hallucinations: None PTSD Symptoms: NA  Past Psychiatric History:   Previous Psychotropic Medications: No   Substance Abuse History in the last 12 months:  Yes.    Consequences of Substance Abuse: NA  Past Medical History:  Past Medical History:  Diagnosis Date  . Anxiety   . Depression   . HIV (human immunodeficiency virus infection) (HCC)   . HIV (human immunodeficiency virus infection) (HCC)     Past Surgical History:  Procedure Laterality Date  . CATARACT EXTRACTION Left     Family Psychiatric History:   Family History:  Family History  Problem Relation Age of Onset  . Mental illness Mother   . Bipolar disorder Brother     Social History:   Social History   Socioeconomic History  . Marital status: Single    Spouse name: Not on file  . Number of children: Not on file  . Years of education: Not on file  . Highest education level: Not on file  Occupational History  . Not on file  Tobacco Use  . Smoking status: Never Smoker  . Smokeless tobacco: Never Used  Substance and Sexual Activity  . Alcohol use: No  . Drug use: No  . Sexual activity: Yes  Other Topics Concern  . Not on file  Social History Narrative  . Not on file   Social Determinants of Health   Financial Resource Strain: Not on file  Food Insecurity: Not on file  Transportation Needs:  Not on file  Physical Activity: Not on file  Stress: Not on file  Social Connections: Not on file    Additional Social History:  Allergies:  No Known Allergies  Metabolic Disorder Labs: Lab Results  Component Value Date   HGBA1C 5.1 10/18/2020   MPG 99.67 10/18/2020   MPG 99.67 07/15/2017   No results found for: PROLACTIN Lab Results  Component Value Date   CHOL 142 10/18/2020   TRIG 52 10/18/2020   HDL  39 (L) 10/18/2020   CHOLHDL 3.6 10/18/2020   VLDL 10 10/18/2020   LDLCALC 93 10/18/2020   LDLCALC 123 (H) 11/28/2019   Lab Results  Component Value Date   TSH 2.025 10/18/2020    Therapeutic Level Labs: No results found for: LITHIUM No results found for: CBMZ No results found for: VALPROATE  Current Medications: Current Outpatient Medications  Medication Sig Dispense Refill  . clonazePAM (KLONOPIN) 0.5 MG tablet Take 0.5 tablets (0.25 mg total) by mouth 2 (two) times daily as needed for anxiety. 10 tablet 0  . bictegravir-emtricitabine-tenofovir AF (BIKTARVY) 50-200-25 MG TABS tablet Take 1 tablet by mouth daily. 30 tablet 5  . escitalopram (LEXAPRO) 20 MG tablet Take 1 tablet (20 mg total) by mouth daily. 30 tablet 0  . ibuprofen (ADVIL) 200 MG tablet Take 200 mg by mouth every 6 (six) hours as needed for fever or pain.    . Lactobacillus (PROBIOTIC ACIDOPHILUS PO) Take 1 tablet by mouth daily.    . Omega-3 Fatty Acids (FISH OIL) 1000 MG CAPS Take 1,000 mg by mouth daily.    Marland Kitchen oxybutynin (DITROPAN-XL) 10 MG 24 hr tablet Take 10 mg by mouth daily.    . psyllium (METAMUCIL) 58.6 % packet Take 1 packet by mouth daily as needed (fiber supplement).    . QUEtiapine (SEROQUEL) 100 MG tablet Take 1 tablet (100 mg total) by mouth at bedtime. 30 tablet 0  . QUEtiapine (SEROQUEL) 50 MG tablet Take 0.5 tablets (25 mg total) by mouth 2 (two) times daily. 60 tablet 0  . tamsulosin (FLOMAX) 0.4 MG CAPS capsule Take 1 capsule (0.4 mg total) by mouth daily. 30 capsule 0   No current facility-administered medications for this visit.    Musculoskeletal: Strength & Muscle Tone: within normal limits Gait & Station: normal Patient leans: N/A  Psychiatric Specialty Exam: Review of Systems  Blood pressure (!) 110/93, pulse 86, height 6\' 4"  (1.93 m), weight 165 lb (74.8 kg), SpO2 99 %.Body mass index is 20.08 kg/m.  General Appearance: Casual  Eye Contact:  Good  Speech:  Clear and Coherent   Volume:  Normal  Mood:  Anxious and Depressed  Affect:  Congruent  Thought Process:  Coherent  Orientation:  Full (Time, Place, and Person)  Thought Content:  Logical  Suicidal Thoughts:  No  Homicidal Thoughts:  No  Memory:  Immediate;   Fair Remote;   Fair  Judgement:  Fair  Insight:  Fair  Psychomotor Activity:  Normal  Concentration:  Concentration: Fair  Recall:  Good  Fund of Knowledge:Fair  Language: Good  Akathisia:  No  Handed:  Right  AIMS (if indicated):   Assets:  Communication Skills Social Support  ADL's:  Intact  Cognition: WNL  Sleep:  Fair   Screenings: AIMS   Flowsheet Row [facility]) [facility]  AIMS Total Score 0 0    AUDIT  Flowsheet Row [facility]) [facility]) [facility] Ad[facility] ([facility]  Alcohol Use Disorder Identification Test Final Score (AUDIT) 0 0 0    GAD-7   Flowsheet Row [facility] Visit from 12/09/2020 in [facility]  Total GAD-7 Score 17    PHQ2-9   Flowsheet Row Office Visit from 12/09/2020 in [facility] ED from 11/07/2020 in  [facility]  PHQ-2 Total Score 4 5  PHQ-9 Total Score 18 18    Flowsheet Row ED from 11/23/2020 in [facility] from 11/20/2020 in [facility] ED from 11/18/2020 in [facility]  C-SSRS RISK CATEGORY Error: Q3, 4, or 5 should not be populated when Q2 is No Error: Q3, 4, or 5 should not be populated when Q2 is No Error: Question 6 not populated      Assessment and Plan:   Major  depression/Anxiety:  Continue Lexapro 20 mg p.o. daily Continue Seroquel 100 mg p.o. nightly and increase Seroquel 25 mg to 50 mg twice daily Initiated clonazepam 0.25 mg p.o. twice daily as needed x10 tablets -Consider intensive outpatient programming for additional coping skills for anxiety/panic symptoms Discontinue hydroxyzine and BuSpar as patient reports not taking medication due to ineffectiveness/ "immediate relief"   Patient to follow-up 4 weeks   Oneta Rack, NP 4/12/20222:27 PM

## 2020-12-10 ENCOUNTER — Encounter (HOSPITAL_COMMUNITY): Payer: Self-pay | Admitting: Family

## 2020-12-11 ENCOUNTER — Other Ambulatory Visit: Payer: Self-pay | Admitting: Family

## 2020-12-18 ENCOUNTER — Ambulatory Visit: Payer: Self-pay

## 2020-12-18 ENCOUNTER — Other Ambulatory Visit: Payer: Self-pay

## 2020-12-18 ENCOUNTER — Ambulatory Visit (INDEPENDENT_AMBULATORY_CARE_PROVIDER_SITE_OTHER): Payer: Self-pay | Admitting: Family

## 2020-12-18 ENCOUNTER — Encounter: Payer: Self-pay | Admitting: Family

## 2020-12-18 VITALS — BP 104/69 | HR 71 | Wt 190.4 lb

## 2020-12-18 DIAGNOSIS — F411 Generalized anxiety disorder: Secondary | ICD-10-CM

## 2020-12-18 DIAGNOSIS — B182 Chronic viral hepatitis C: Secondary | ICD-10-CM

## 2020-12-18 DIAGNOSIS — Z21 Asymptomatic human immunodeficiency virus [HIV] infection status: Secondary | ICD-10-CM

## 2020-12-18 DIAGNOSIS — R634 Abnormal weight loss: Secondary | ICD-10-CM

## 2020-12-18 DIAGNOSIS — Z0000 Encounter for general adult medical examination without abnormal findings: Secondary | ICD-10-CM

## 2020-12-18 NOTE — Progress Notes (Unsigned)
Mental Health Therapist Progress Note   Name: Tyler Horn  Total time: 30  Type of Service: Individual Outpatient Mental Health Therapy  OBJECTIVE:  Mood: Anxious and Affect: Appropriate Risk of harm to self or others: No plan to harm self or others  DIAGNOSIS:   GOALS ADDRESSED:  Patient will: 1.  Reduce symptoms of: anxiety  2.  Increase knowledge and/or ability of: coping skills  3.  Demonstrate ability to: Increase motivation to adhere to plan of care  INTERVENTIONS: Interventions utilized:  Supportive Counseling Therapist met with patient for outpatient mental health individual therapy to include ongoing assessment, support, and reinforcement.  Therapist allowed patient to "check in" since previous session; asking patient to share any positive coping skills they may have used over the previous week, along with any challenges faced.  Therapist provided supportive listening as patient processed their thoughts, emotional responses, and behaviors surrounding several stressors.  EFFECTIVENESS/PLAN: Client was alert, oriented x3, with no SI, HI, or symptoms of psychosis (risk low).  Client was pleasant and friendly, engaging openly and appropriately with therapist, benefiting from supportive listening and exploration of feelings.  Next session recommended in one to two weeks.   Hughes Better, LCSW

## 2020-12-18 NOTE — Assessment & Plan Note (Signed)
   Discussed importance of safe sexual practice to reduce risk of STI.  Condoms declined.  Prevnar updated today. 

## 2020-12-18 NOTE — Progress Notes (Signed)
Subjective:    Patient ID: Tyler Horn, male    DOB: Jul 03, 1961, 60 y.o.   MRN: [id number]  Chief Complaint  Patient presents with  . Follow-up    Recent medciation changes (anxiety)     HPI:  [name] is a 60 y.o. male with HIV disease with well-controlled virus and good adherence and tolerance to his ART regimen of Biktarvy.  Viral load at the time was undetectable with CD4 count of 501.  Has been seen in the ED on several occasions for issues with voiding.  Currently has a catheter.  Here today for routine follow-up.  [name] is hearing impaired and a sign language interpreter is present to aid in communication.  [name] continues to take his Biktarvy daily as prescribed with no adverse side effects or missed doses since his last office visit.  Overall he is feeling okay today as they continue to adjust his psychiatric medications. Denies fevers, chills, night sweats, headaches, changes in vision, neck pain/stiffness, nausea, diarrhea, vomiting, lesions or rashes.  [name] is no problems taking medications from the pharmacy and remains covered through [facility].  Have anxiety and denies any feelings of being down, depressed, or hopeless recently.  Concerned about weight loss.  No recreational or illicit drug use, tobacco use, or alcohol consumption.  Declines condoms.  Is having some constipation.  Healthcare maintenance due includes Prevnar.   No Known Allergies    Outpatient Medications Prior to Visit  Medication Sig Dispense Refill  . bictegravir-emtricitabine-tenofovir AF (BIKTARVY) 50-200-25 MG TABS tablet Take 1 tablet by mouth daily. 30 tablet 5  . clonazePAM (KLONOPIN) 0.5 MG tablet Take 0.5 tablets (0.25 mg total) by mouth 2 (two) times daily as needed for anxiety. 10 tablet 0  . escitalopram (LEXAPRO) 20 MG tablet Take 1 tablet (20 mg total) by mouth daily. 30 tablet 0  . Omega-3 Fatty Acids (FISH OIL) 1000 MG CAPS Take 1,000 mg by mouth daily.    . QUEtiapine  (SEROQUEL) 100 MG tablet Take 1 tablet (100 mg total) by mouth at bedtime. 30 tablet 0  . QUEtiapine (SEROQUEL) 50 MG tablet Take 0.5 tablets (25 mg total) by mouth 2 (two) times daily. 60 tablet 0  . tamsulosin (FLOMAX) 0.4 MG CAPS capsule Take 1 capsule (0.4 mg total) by mouth daily. 30 capsule 0  . ibuprofen (ADVIL) 200 MG tablet Take 200 mg by mouth every 6 (six) hours as needed for fever or pain.    . Lactobacillus (PROBIOTIC ACIDOPHILUS PO) Take 1 tablet by mouth daily.    Marland Kitchen oxybutynin (DITROPAN-XL) 10 MG 24 hr tablet Take 10 mg by mouth daily.    . psyllium (METAMUCIL) 58.6 % packet Take 1 packet by mouth daily as needed (fiber supplement).     No facility-administered medications prior to visit.     Past Medical History:  Diagnosis Date  . Anxiety   . Depression   . HIV (human immunodeficiency virus infection) (HCC)   . HIV (human immunodeficiency virus infection) (HCC)      Past Surgical History:  Procedure Laterality Date  . CATARACT EXTRACTION Left        Review of Systems  Constitutional: Negative for appetite change, chills, fatigue, fever and unexpected weight change.  Eyes: Negative for visual disturbance.  Respiratory: Negative for cough, chest tightness, shortness of breath and wheezing.   Cardiovascular: Negative for chest pain and leg swelling.  Gastrointestinal: Negative for abdominal pain, constipation, diarrhea, nausea and vomiting.  Genitourinary:  Negative for dysuria, flank pain, frequency, genital sores, hematuria and urgency.  Skin: Negative for rash.  Allergic/Immunologic: Negative for immunocompromised state.  Neurological: Negative for dizziness and headaches.      Objective:    BP 104/69   Pulse 71   Wt 190 lb 6.4 oz (86.4 kg) Comment: previous appt was mistyped per patient (195)  BMI 23.18 kg/m  Nursing note and vital signs reviewed.  Physical Exam Constitutional:      General: He is not in acute distress.    Appearance: He is  well-developed.  Eyes:     Conjunctiva/sclera: Conjunctivae normal.  Cardiovascular:     Rate and Rhythm: Normal rate and regular rhythm.     Heart sounds: Normal heart sounds. No murmur heard. No friction rub. No gallop.   Pulmonary:     Effort: Pulmonary effort is normal. No respiratory distress.     Breath sounds: Normal breath sounds. No wheezing or rales.  Chest:     Chest wall: No tenderness.  Abdominal:     General: Bowel sounds are normal.     Palpations: Abdomen is soft.     Tenderness: There is no abdominal tenderness.  Musculoskeletal:     Cervical back: Neck supple.  Lymphadenopathy:     Cervical: No cervical adenopathy.  Skin:    General: Skin is warm and dry.     Findings: No rash.  Neurological:     Mental Status: He is alert and oriented to person, place, and time.  Psychiatric:        Mood and Affect: Mood is anxious.        Behavior: Behavior normal.        Thought Content: Thought content normal.        Judgment: Judgment normal.      Depression screen [facility] 2/9 12/18/2020 11/07/2020  Decreased Interest 0 2  Down, Depressed, Hopeless 1 3  PHQ - 2 Score 1 5  Altered sleeping - 3  Tired, decreased energy - 1  Change in appetite - 1  Feeling bad or failure about yourself  - 2  Trouble concentrating - 3  Moving slowly or fidgety/restless - 1  Suicidal thoughts - 2  PHQ-9 Score - 18  Some encounter information is confidential and restricted. Go to Review Flowsheets activity to see all data.       Assessment & Plan:    Patient Active Problem List   Diagnosis Date Noted  . Vision changes 07/09/2020  . Weight loss 07/09/2020  . Chronic hepatitis C (HCC) 04/27/2020  . Idiopathic peripheral neuropathy 04/27/2020  . Herpes simplex 04/27/2020  . Herpes zoster 04/27/2020  . Healthcare maintenance 12/19/2019  . 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 Post-traumatic stress disorder, unspecified 11/28/2019  . 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 Major depressive disorder, recurrent, severe without psychotic behavior (HCC) 07/13/2017  . Benzodiazepine misuse 07/31/2014  . Human immunodeficiency virus infection (HCC)   . Delusional disorder (HCC) 07/30/2014  . Suicidal thoughts 07/30/2014  . Generalized anxiety disorder 03/26/2013     Problem List Items Addressed This Visit      Digestive   Chronic hepatitis C (HCC)   Relevant Orders   Hepatitis C RNA quantitative     Other   Generalized anxiety disorder    Mr. Scola appears to be anxious today and continues to undergo medication adjustments to help with his anxiety/depression.  Understands this will take time.  No suicidal ideations or signs of psychosis.  Continue current management per psychiatry.      Human immunodeficiency virus infection (HCC) - Primary    Mr. Clearwater continues to have well-controlled HIV disease with good adherence and tolerance to his ART regimen of Biktarvy.  No signs/symptoms of opportunistic infection or progressive HIV.  We reviewed previous lab work and discussed plan of care.  Check blood work today.  Continue current dose of Biktarvy.  Plan for follow-up in 3 months or sooner if needed      Relevant Orders   HIV-1 RNA quant-no reflex-bld   T-helper cell (CD4)- ([facility] clinic only)   Healthcare maintenance     Discussed importance of safe sexual practice to reduce risk of STI.  Condoms declined.  Prevnar updated today.      Weight loss    BMI currently within normal ranges.  Requesting nutritional supplementation with Ensure.  Will speak with case management about obtaining Ensure.          I am having Braidyn Rotman maintain his Lactobacillus (PROBIOTIC ACIDOPHILUS PO), psyllium, ibuprofen, Fish Oil, Biktarvy, tamsulosin, oxybutynin, QUEtiapine, QUEtiapine, escitalopram, and clonazePAM.   No orders of the defined types were placed in this encounter.    Follow-up: Return in  about 3 months (around 03/19/2021), or if symptoms worsen or fail to improve.   Marcos Eke, MSN, FNP-C Nurse Practitioner [facility] Main number: [phone]

## 2020-12-18 NOTE — Assessment & Plan Note (Signed)
Tyler Horn appears to be anxious today and continues to undergo medication adjustments to help with his anxiety/depression.  Understands this will take time.  No suicidal ideations or signs of psychosis.  Continue current management per psychiatry.

## 2020-12-18 NOTE — Assessment & Plan Note (Signed)
BMI currently within normal ranges.  Requesting nutritional supplementation with Ensure.  Will speak with case management about obtaining Ensure.

## 2020-12-18 NOTE — Patient Instructions (Signed)
Nice to see you.  We will check your blood work today.  Continue to take your [place] daily as prescribed.   Refills have sent to the pharmacy.   Plan for follow up in 3 months or sooner if needed with lab work on the same day.  Have a great day and stay safe!

## 2020-12-18 NOTE — Assessment & Plan Note (Signed)
Tyler Horn continues to have well-controlled HIV disease with good adherence and tolerance to his ART regimen of Biktarvy.  No signs/symptoms of opportunistic infection or progressive HIV.  We reviewed previous lab work and discussed plan of care.  Check blood work today.  Continue current dose of Biktarvy.  Plan for follow-up in 3 months or sooner if needed

## 2020-12-19 LAB — T-HELPER CELL (CD4) - (RCID CLINIC ONLY)
CD4 % Helper T Cell: 33 % (ref 33–65)
CD4 T Cell Abs: 425 /uL (ref 400–1790)

## 2020-12-21 LAB — HEPATITIS C RNA QUANTITATIVE
HCV Quantitative Log: <1.18 log IU/mL
HCV RNA, PCR, QN: <15 IU/mL

## 2020-12-21 LAB — HIV-1 RNA QUANT-NO REFLEX-BLD
HIV 1 RNA Quant: NOT DETECTED Copies/mL
HIV-1 RNA Quant, Log: NOT DETECTED Log cps/mL

## 2020-12-25 ENCOUNTER — Ambulatory Visit: Payer: Self-pay

## 2020-12-25 NOTE — Progress Notes (Deleted)
Patient ID: Tyler Horn, male   DOB: 06-29-1961, 60 y.o.   MRN: [id number]   After ED visit 11/23/2020  From ED A/P: 60 year old male who presents with suprapubic pain, reported urinary retention, burning to the urethra.  On arrival, he is writhing in pain, asking that we put in a catheter.  Bedside bladder scan shows 44 mils of urine.  No flank tenderness on exam, mild suprapubic tenderness noted.  Vitals reassuring, afebrile.  CBC and CMP largely unremarkable.  UA with moderate hemoglobin, no evidence of infection.  GC chlamydia pending.  Hematuria consistent from prior UAs.  Patient was given some oxybutynin, Ativan as there is concern for possible urethral spasm.  As soon as catheter was placed, though patient did not produce a significant bout of urine, he states he felt immediately better.  Question possible psychosomatic element of this as well.  No evidence of acute abdominal surgical emergency, low suspicion for perforated bladder, low suspicion for kidney stone, pyelonephritis.  Will discharge with Foley catheter given patient has had multiple visits to the ER with the same complaint. Already taking oxybutynin.  He was instructed to call [facility] with whom he has a pending appointment in May, and inform them that he has a Foley catheter in place he needs to be seen sooner as per discussion with Dr. Estell Harpin.  He voiced understanding and is agreeable.  Return precautions discussed.  Stable for discharge.

## 2020-12-31 ENCOUNTER — Inpatient Hospital Stay: Payer: Self-pay | Admitting: Physician Assistant

## 2021-01-07 ENCOUNTER — Encounter (HOSPITAL_COMMUNITY): Payer: Self-pay | Admitting: Physician Assistant

## 2021-01-07 ENCOUNTER — Other Ambulatory Visit: Payer: Self-pay

## 2021-01-07 ENCOUNTER — Ambulatory Visit (INDEPENDENT_AMBULATORY_CARE_PROVIDER_SITE_OTHER): Payer: No Payment, Other | Admitting: Physician Assistant

## 2021-01-07 VITALS — BP 106/78 | HR 107 | Ht 76.0 in | Wt 178.0 lb

## 2021-01-07 DIAGNOSIS — F333 Major depressive disorder, recurrent, severe with psychotic symptoms: Secondary | ICD-10-CM | POA: Diagnosis not present

## 2021-01-07 DIAGNOSIS — F410 Panic disorder [episodic paroxysmal anxiety] without agoraphobia: Secondary | ICD-10-CM | POA: Diagnosis not present

## 2021-01-07 DIAGNOSIS — F411 Generalized anxiety disorder: Secondary | ICD-10-CM

## 2021-01-07 DIAGNOSIS — R441 Visual hallucinations: Secondary | ICD-10-CM

## 2021-01-07 MED ORDER — QUETIAPINE FUMARATE 50 MG PO TABS: 50.0000 mg | ORAL_TABLET | Freq: Two times a day (BID) | ORAL | 1 refills | Status: DC

## 2021-01-07 MED ORDER — ESCITALOPRAM OXALATE 20 MG PO TABS: 40.0000 mg | ORAL_TABLET | Freq: Every day | ORAL | 1 refills | Status: DC

## 2021-01-07 MED ORDER — QUETIAPINE FUMARATE 200 MG PO TABS: 200.0000 mg | ORAL_TABLET | Freq: Every day | ORAL | 1 refills | Status: DC

## 2021-01-07 NOTE — Progress Notes (Signed)
BH MD/PA/NP OP Progress Note  01/07/2021 6:36 PM Tyler Horn  MRN:  [id number]  Chief Complaint:  Chief Complaint    Medication Management     HPI:   Tyler Horn is a 60 year old male with a past psychiatric history significant for generalized anxiety disorder, major depressive disorder, and panic disorder who presents to [facility] for follow-up and medication management.  Language interpretive services were utilized during the encounter due to patient's use of sign language to communicate.  Patient is currently being managed on the following medications:  Lexapro 20 mg daily Seroquel 100 mg at bedtime Seroquel 50 mg 2 times daily  Patient reports worsening anxiety.  He does endorses that his use of Seroquel has been somewhat helpful in the management of anxiety, however, he still expresses that his anxiety has a major impact on his life.  Patient rates his current anxiety an 8 out of 10.  Patient is unsure of stressors related to his anxiety and states that he has been unable to socialize with other people due to his worsening anxiety.  A GAD-7 screen was performed with the patient scoring a 16.  A PHQ 9 screen was performed with the patient scoring a 13.  It should be noted that when performing the screening assessment tools, patient was sometimes unable to provide accurate answers to questions addressed to him.  Patient is cooperative and fully engaged in conversation during the encounter.  Patient is visibly anxious and restless on exam.  Patient reports that he is "sad, depressed, nervous, and is unable to think straight."  Patient denies suicidal or homicidal ideations.  Patient endorses visual hallucinations characterized by seeing different things such as "black shapes."  Patient denies auditory hallucinations.  Patient endorses good sleep and receives on average 8 hours of sleep each night.  Patient endorses decreased appetite.  Patient denies  alcohol consumption, tobacco use, and illicit drug use.  Visit Diagnosis:    ICD-10-CM   1. Generalized anxiety disorder  F41.1 QUEtiapine (SEROQUEL) 200 MG tablet    QUEtiapine (SEROQUEL) 50 MG tablet    escitalopram (LEXAPRO) 20 MG tablet  2. Severe episode of recurrent major depressive disorder, with psychotic features (HCC)  F33.3 escitalopram (LEXAPRO) 20 MG tablet  3. Panic disorder  F41.0 QUEtiapine (SEROQUEL) 200 MG tablet    QUEtiapine (SEROQUEL) 50 MG tablet    escitalopram (LEXAPRO) 20 MG tablet  4. Visual hallucinations  R44.1 QUEtiapine (SEROQUEL) 200 MG tablet    QUEtiapine (SEROQUEL) 50 MG tablet    Past Psychiatric History:  Major depressive disorder Anxiety  Past Medical History:  Past Medical History:  Diagnosis Date  . Anxiety   . Depression   . HIV (human immunodeficiency virus infection) (HCC)   . HIV (human immunodeficiency virus infection) (HCC)     Past Surgical History:  Procedure Laterality Date  . CATARACT EXTRACTION Left     Family Psychiatric History:  Family history of psychiatric illness unknown  Family History:  Family History  Problem Relation Age of Onset  . Mental illness Mother   . Bipolar disorder Brother     Social History:  Social History   Socioeconomic History  . Marital status: Single    Spouse name: Not on file  . Number of children: Not on file  . Years of education: Not on file  . Highest education level: Not on file  Occupational History  . Not on file  Tobacco Use  . Smoking status: Never  Smoker  . Smokeless tobacco: Never Used  Substance and Sexual Activity  . Alcohol use: No  . Drug use: No  . Sexual activity: Yes  Other Topics Concern  . Not on file  Social History Narrative  . Not on file   Social Determinants of Health   Financial Resource Strain: Not on file  Food Insecurity: Not on file  Transportation Needs: Not on file  Physical Activity: Not on file  Stress: Not on file  Social  Connections: Not on file    Allergies: No Known Allergies  Metabolic Disorder Labs: Lab Results  Component Value Date   HGBA1C 5.1 10/18/2020   MPG 99.67 10/18/2020   MPG 99.67 07/15/2017   No results found for: PROLACTIN Lab Results  Component Value Date   CHOL 142 10/18/2020   TRIG 52 10/18/2020   HDL 39 (L) 10/18/2020   CHOLHDL 3.6 10/18/2020   VLDL 10 10/18/2020   LDLCALC 93 10/18/2020   LDLCALC 123 (H) 11/28/2019   Lab Results  Component Value Date   TSH 2.025 10/18/2020   TSH 0.803 11/28/2019    Therapeutic Level Labs: No results found for: LITHIUM No results found for: VALPROATE No components found for:  CBMZ  Current Medications: Current Outpatient Medications  Medication Sig Dispense Refill  . bictegravir-emtricitabine-tenofovir AF (BIKTARVY) 50-200-25 MG TABS tablet Take 1 tablet by mouth daily. 30 tablet 5  . clonazePAM (KLONOPIN) 0.5 MG tablet Take 0.5 tablets (0.25 mg total) by mouth 2 (two) times daily as needed for anxiety. 10 tablet 0  . ibuprofen (ADVIL) 200 MG tablet Take 200 mg by mouth every 6 (six) hours as needed for fever or pain.    . Lactobacillus (PROBIOTIC ACIDOPHILUS PO) Take 1 tablet by mouth daily.    . Omega-3 Fatty Acids (FISH OIL) 1000 MG CAPS Take 1,000 mg by mouth daily.    Marland Kitchen. oxybutynin (DITROPAN-XL) 10 MG 24 hr tablet Take 10 mg by mouth daily.    . psyllium (METAMUCIL) 58.6 % packet Take 1 packet by mouth daily as needed (fiber supplement).    . tamsulosin (FLOMAX) 0.4 MG CAPS capsule Take 1 capsule (0.4 mg total) by mouth daily. 30 capsule 0  . escitalopram (LEXAPRO) 20 MG tablet Take 2 tablets (40 mg total) by mouth daily. 60 tablet 1  . QUEtiapine (SEROQUEL) 200 MG tablet Take 1 tablet (200 mg total) by mouth at bedtime. 30 tablet 1  . QUEtiapine (SEROQUEL) 50 MG tablet Take 1 tablet (50 mg total) by mouth 2 (two) times daily. 60 tablet 1   No current facility-administered medications for this visit.      Musculoskeletal: Strength & Muscle Tone: within normal limits Gait & Station: normal Patient leans: Right and N/A  Psychiatric Specialty Exam: Review of Systems  Psychiatric/Behavioral: Positive for dysphoric mood and hallucinations. Negative for decreased concentration, self-injury, sleep disturbance and suicidal ideas. The patient is nervous/anxious. The patient is not hyperactive.     Blood pressure 106/78, pulse (!) 107, height 6\' 4"  (1.93 m), weight 178 lb (80.7 kg), SpO2 100 %.Body mass index is 21.67 kg/m.  General Appearance: Fairly Groomed  Eye Contact:  Good  Speech:  Unable to assess due to patient utilizing sign language  Volume:  Unable to assess due to patient utilizing sign language. Patient is deaf  Mood:  Anxious, Depressed, Dysphoric and Irritable  Affect:  Congruent, Depressed and Labile  Thought Process:  Coherent, Goal Directed and Descriptions of Associations: Intact  Orientation:  Full (  Time, Place, and Person)  Thought Content: WDL   Suicidal Thoughts:  No  Homicidal Thoughts:  No  Memory:  Immediate;   Good Recent;   Fair Remote;   Fair  Judgement:  Fair  Insight:  Fair  Psychomotor Activity:  Restlessness  Concentration:  Concentration: Good and Attention Span: Good  Recall:  Good  Fund of Knowledge: Fair  Language: Unable to assess, patient utilizes sign language  Akathisia:  NA  Handed:  Right  AIMS (if indicated): not done  Assets:  Communication Skills Desire for Improvement Social Support  ADL's:  Intact  Cognition: WNL  Sleep:  Good   Screenings: AIMS   Flowsheet [facility]) [facility]  AIMS Total Score 0 0    AUDIT   Flowsheet Row [facility]) [facility] (Dis[facility]/2019 in [facility]INE Ad[facility] ([facility]  Alcohol Use Disorder Identification Test Final Score (AUDIT) 0 0 0    GAD-7   Flowsheet Row Office Visit from 01/07/2021 in [facility] Office Visit from 12/09/2020 in [facility]  Total GAD-7 Score 16 17    PHQ2-9   Flowsheet Row Office Visit from 01/07/2021 in [facility] Office Visit from 12/18/2020 in [facility] for Infectious Disease Office Visit from 12/09/2020 in [facility] ED from 11/07/2020 in [facility]  PHQ-2 Total Score 6 1 4 5   PHQ-9 Total Score 13 -- 18 18    Flowsheet Row Office Visit from 01/07/2021 in [facility] ED from 11/23/2020 in [facility] from 11/20/2020 in [facility]  C-SSRS RISK CATEGORY No Risk Error: Q3, 4, or 5 should not be populated when Q2 is No Error: Q3, 4, or 5 should not be populated when Q2 is No       Assessment and Plan:   Tyler Horn is a 60 year old male with a past psychiatric history significant for generalized anxiety disorder, major depressive disorder, and panic disorder who presents to [facility] for follow-up and medication management.  Language interpretive services were utilized during the encounter due to patient's use of sign language to communicate.  Patient reports that he is still continuing to experience worsening anxiety.  Patient adds that he is also experiencing visual hallucinations.  Provider is unsure if visual hallucinations are tied to the patient's worsening anxiety.  Patient was recommended adjusting Lexapro dosage from 20 mg to 40 mg daily for the management of his worsening anxiety.  Patient was also recommended increasing  his nighttime dosage of Seroquel from 100 mg to 200 mg at bedtime for the management of anxiety and hallucinations.  Patient is agreeable to recommendation.  Patient's medications to be e-prescribed to pharmacy of choice.  1. Generalized anxiety disorder  - QUEtiapine (SEROQUEL) 200 MG tablet; Take 1 tablet (200 mg total) by mouth at bedtime.  Dispense: 30 tablet; Refill: 1 - QUEtiapine (SEROQUEL) 50 MG tablet; Take 1 tablet (50 mg total) by mouth 2 (two) times daily.  Dispense: 60 tablet; Refill: 1 - escitalopram (LEXAPRO) 20 MG tablet; Take 2 tablets (40 mg total) by mouth daily.  Dispense: 60 tablet; Refill: 1  2. Severe episode of recurrent major depressive disorder, with psychotic features (HCC)  - escitalopram (LEXAPRO) 20 MG tablet; Take 2 tablets (40 mg total) by mouth daily.  Dispense: 60 tablet; Refill: 1  3. Panic disorder  - QUEtiapine (SEROQUEL) 200 MG tablet; Take 1 tablet (200 mg total) by mouth at bedtime.  Dispense: 30 tablet; Refill: 1 - QUEtiapine (SEROQUEL) 50 MG tablet; Take 1 tablet (50 mg total) by mouth 2 (two) times daily.  Dispense: 60 tablet; Refill: 1 - escitalopram (LEXAPRO) 20 MG tablet; Take 2 tablets (40 mg total) by mouth daily.  Dispense: 60 tablet; Refill: 1  4. Visual hallucinations  - QUEtiapine (SEROQUEL) 200 MG tablet; Take 1 tablet (200 mg total) by mouth at bedtime.  Dispense: 30 tablet; Refill: 1 - QUEtiapine (SEROQUEL) 50 MG tablet; Take 1 tablet (50 mg total) by mouth 2 (two) times daily.  Dispense: 60 tablet; Refill: 1  Patient to follow-up in 6 weeks  Meta Hatchet, PA 01/07/2021, 6:36 PM

## 2021-02-09 ENCOUNTER — Other Ambulatory Visit: Payer: Self-pay | Admitting: Infectious Diseases

## 2021-02-09 DIAGNOSIS — Z21 Asymptomatic human immunodeficiency virus [HIV] infection status: Secondary | ICD-10-CM

## 2021-02-19 ENCOUNTER — Ambulatory Visit (INDEPENDENT_AMBULATORY_CARE_PROVIDER_SITE_OTHER): Payer: No Payment, Other | Admitting: Physician Assistant

## 2021-02-19 ENCOUNTER — Encounter (HOSPITAL_COMMUNITY): Payer: Self-pay | Admitting: Physician Assistant

## 2021-02-19 ENCOUNTER — Other Ambulatory Visit: Payer: Self-pay

## 2021-02-19 VITALS — BP 106/62 | HR 98 | Ht 76.0 in | Wt 185.0 lb

## 2021-02-19 DIAGNOSIS — F99 Mental disorder, not otherwise specified: Secondary | ICD-10-CM | POA: Insufficient documentation

## 2021-02-19 DIAGNOSIS — R441 Visual hallucinations: Secondary | ICD-10-CM | POA: Diagnosis not present

## 2021-02-19 DIAGNOSIS — F333 Major depressive disorder, recurrent, severe with psychotic symptoms: Secondary | ICD-10-CM | POA: Diagnosis not present

## 2021-02-19 DIAGNOSIS — F411 Generalized anxiety disorder: Secondary | ICD-10-CM

## 2021-02-19 DIAGNOSIS — F410 Panic disorder [episodic paroxysmal anxiety] without agoraphobia: Secondary | ICD-10-CM

## 2021-02-19 DIAGNOSIS — F21 Schizotypal disorder: Secondary | ICD-10-CM

## 2021-02-19 DIAGNOSIS — F5105 Insomnia due to other mental disorder: Secondary | ICD-10-CM

## 2021-02-19 MED ORDER — ESCITALOPRAM OXALATE 10 MG PO TABS: 10.0000 mg | ORAL_TABLET | Freq: Every day | ORAL | 0 refills | Status: DC

## 2021-02-19 MED ORDER — FLUOXETINE HCL 20 MG PO CAPS: 20.0000 mg | ORAL_CAPSULE | Freq: Every day | ORAL | 2 refills | Status: DC

## 2021-02-19 MED ORDER — OLANZAPINE 10 MG PO TABS: 10.0000 mg | ORAL_TABLET | Freq: Every day | ORAL | 1 refills | Status: DC

## 2021-02-19 NOTE — Progress Notes (Signed)
BH MD/PA/NP OP Progress Note  02/19/2021 8:47 AM Tyler Horn  MRN:  [id number]  Chief Complaint:  Chief Complaint   Medication Management    HPI:   Tyler Horn is a 60 year old male with a past psychiatric history significant for generalized anxiety disorder, major depressive disorder, and panic disorder who presents to [facility] for follow-up and medication management.  Patient is currently being managed on the following medications:  Lexapro 40 mg daily Seroquel 50 mg 2 times daily Seroquel 200 mg at bedtime  Patient reports that he has not been doing well and believes that his medications are not working.  Patient endorses ruminating thoughts, constant thinking and worsening anxiety.  Patient rates his anxiety an 8 out of 10 and still continues to experience panic attacks.  Patient endorses improvement in his sleep but states that it has not been a deep sleep.  Patient expresses that he feels like he has not been himself for a long time.  Patient endorses depression which she rates a 10 out of 10.  Patient endorses depressed mood, lack of energy, lack of interest in activities, and difficulty concentrating.  Patient believes that the reason why his medications are not working is because his body has gotten used to them.  Patient is interested in being placed on a new medication regimen.  PHQ-9 screen was performed with the patient scoring a 20.  A GAD-7 screen was also performed with the patient scoring a 20.  Patient is anxious and irritable on exam.  Patient is able to answer all questions positioned to him.  Patient states that he is feeling all over the place and rates his mood a 2 out of 10.  Patient denies suicidal or homicidal ideations.  Patient endorses visual hallucinations in the form of flashes of lights and bugs crawling on him.  Patient denies auditory hallucinations.  Patient does not appear to be responding to internal/external  stimuli.  Patient endorses fair appetite and eats on average 2 meals per day.  Patient endorses good sleep and receives on average 8 to 9 hours of sleep each night.  Patient denies alcohol consumption, tobacco use, and illicit drug use.  Visit Diagnosis:    ICD-10-CM   1. Visual hallucinations  R44.1     2. Generalized anxiety disorder  F41.1     3. Severe episode of recurrent major depressive disorder, with psychotic features (HCC)  F33.3     4. Panic disorder  F41.0       Past Psychiatric History:  Generalized anxiety disorder Major depressive disorder Panic disorder Visual hallucinations   Past Medical History:  Past Medical History:  Diagnosis Date   Anxiety    Depression    HIV (human immunodeficiency virus infection) (HCC)    HIV (human immunodeficiency virus infection) (HCC)     Past Surgical History:  Procedure Laterality Date   CATARACT EXTRACTION Left     Family Psychiatric History:  Family history of psychiatric illness unknown  Family History:  Family History  Problem Relation Age of Onset   Mental illness Mother    Bipolar disorder Brother     Social History:  Social History   Socioeconomic History   Marital status: Single    Spouse name: Not on file   Number of children: Not on file   Years of education: Not on file   Highest education level: Not on file  Occupational History   Not on file  Tobacco Use  Smoking status: Never   Smokeless tobacco: Never  Substance and Sexual Activity   Alcohol use: No   Drug use: No   Sexual activity: Yes  Other Topics Concern   Not on file  Social History Narrative   Not on file   Social Determinants of Health   Financial Resource Strain: Not on file  Food Insecurity: Not on file  Transportation Needs: Not on file  Physical Activity: Not on file  Stress: Not on file  Social Connections: Not on file    Allergies: No Known Allergies  Metabolic Disorder Labs: Lab Results  Component Value Date    HGBA1C 5.1 10/18/2020   MPG 99.67 10/18/2020   MPG 99.67 07/15/2017   No results found for: PROLACTIN Lab Results  Component Value Date   CHOL 142 10/18/2020   TRIG 52 10/18/2020   HDL 39 (L) 10/18/2020   CHOLHDL 3.6 10/18/2020   VLDL 10 10/18/2020   LDLCALC 93 10/18/2020   LDLCALC 123 (H) 11/28/2019   Lab Results  Component Value Date   TSH 2.025 10/18/2020   TSH 0.803 11/28/2019    Therapeutic Level Labs: No results found for: LITHIUM No results found for: VALPROATE No components found for:  CBMZ  Current Medications: Current Outpatient Medications  Medication Sig Dispense Refill   bictegravir-emtricitabine-tenofovir AF (BIKTARVY) 50-200-25 MG TABS tablet Take 1 tablet by mouth daily. 30 tablet 5   clonazePAM (KLONOPIN) 0.5 MG tablet Take 0.5 tablets (0.25 mg total) by mouth 2 (two) times daily as needed for anxiety. 10 tablet 0   ibuprofen (ADVIL) 200 MG tablet Take 200 mg by mouth every 6 (six) hours as needed for fever or pain.     Lactobacillus (PROBIOTIC ACIDOPHILUS PO) Take 1 tablet by mouth daily.     Omega-3 Fatty Acids (FISH OIL) 1000 MG CAPS Take 1,000 mg by mouth daily.     oxybutynin (DITROPAN-XL) 10 MG 24 hr tablet Take 10 mg by mouth daily.     psyllium (METAMUCIL) 58.6 % packet Take 1 packet by mouth daily as needed (fiber supplement).     QUEtiapine (SEROQUEL) 200 MG tablet Take 1 tablet (200 mg total) by mouth at bedtime. 30 tablet 1   QUEtiapine (SEROQUEL) 50 MG tablet Take 1 tablet (50 mg total) by mouth 2 (two) times daily. 60 tablet 1   tamsulosin (FLOMAX) 0.4 MG CAPS capsule Take 1 capsule (0.4 mg total) by mouth daily. 30 capsule 0   No current facility-administered medications for this visit.     Musculoskeletal: Strength & Muscle Tone: within normal limits Gait & Station: normal Patient leans: N/A  Psychiatric Specialty Exam: Review of Systems  Psychiatric/Behavioral:  Positive for agitation, decreased concentration, dysphoric mood,  hallucinations and sleep disturbance. Negative for self-injury and suicidal ideas. The patient is nervous/anxious. The patient is not hyperactive.    Blood pressure 106/62, pulse 98, height 6\' 4"  (1.93 m), weight 185 lb (83.9 kg).Body mass index is 22.52 kg/m.  General Appearance: Fairly Groomed  Eye Contact:  Good  Speech:  Unable to assess due to patient utilizing sign language  Volume:  Unable to assess due to patient utilizing sign language. Patient is deaf  Mood:  Anxious, Depressed, Dysphoric, and Irritable  Affect:  Congruent, Depressed, and Labile  Thought Process:  Coherent, Goal Directed, and Descriptions of Associations: Intact  Orientation:  Full (Time, Place, and Person)  Thought Content: WDL   Suicidal Thoughts:  No  Homicidal Thoughts:  No  Memory:  Immediate;  Good Recent;   Fair Remote;   Fair  Judgement:  Fair  Insight:  Fair  Psychomotor Activity:  Restlessness  Concentration:  Concentration: Good and Attention Span: Good  Recall:  Good  Fund of Knowledge: Fair  Language: Unable to assess, patient utilizes sign language  Akathisia:  NA  Handed:  Right  AIMS (if indicated): not done  Assets:  Communication Skills Desire for Improvement Social Support  ADL's:  Intact  Cognition: WNL  Sleep:  Good   Screenings: AIMS    Flowsheet Row [facility]) [facility]  AIMS Total Score 0 0      AUDIT    Flowsheet Row [facility]) [facility] Admission (Discharged) from 11/28/2019 in [facility] BE[facility]AL [facility]ICINE Admission ([facility]  Alcohol Use Disorder Identification Test Final Score (AUDIT) 0 0 0      GAD-7    Flowsheet Row Office Visit from 02/19/2021 in [facility] Office Visit from 01/07/2021 in [facility] Office Visit from 12/09/2020 in [facility]  Total GAD-7 Score 20 16 17       PHQ2-9    Flowsheet Row Office Visit from 02/19/2021 in [facility] Office Visit from 01/07/2021 in [facility] Office Visit from 12/18/2020 in [facility] for Infectious Disease Office Visit from 12/09/2020 in [facility] ED from 11/07/2020 in [facility]  PHQ-2 Total Score 5 6 1 4 5   PHQ-9 Total Score 20 13 -- 18 18      Flowsheet Row Office Visit from 02/19/2021 in [facility] Office Visit from 01/07/2021 in [facility] ED from 11/23/2020 in [place]  C-SSRS RISK CATEGORY Low Risk No Risk Error: Q3, 4, or 5 should not be populated when Q2 is No        Assessment and Plan:   Tyler Horn is a 60 year old male with a past psychiatric history significant for generalized anxiety disorder, major depressive disorder, and panic disorder who presents to [facility] for follow-up and medication management.  Patient endorses worsening anxiety, panic attacks, depressive symptoms.  Patient believes that his current medication regimen is not working and would like to be placed on a whole new regimen of medications.  Patient was recommended Prozac 20 mg daily for the management of his anxiety, panic attacks, and depressive symptoms.  Patient was also recommended olanzapine 10 mg at bedtime for the management of his sleep disturbances and visual hallucinations.  Patient was advised to taper off from Lexapro prior to discontinuing.  Patient to take Lexapro 20 mg for 3 days followed by 10 mg for 3 more days before discontinuing.  Patient was informed  to discontinue taking Seroquel 50 mg 2 times daily.  Patient was also advised to take Seroquel 100 mg at bedtime for 3 days prior to discontinuing and starting olanzapine.  Patient was agreeable to recommendation and plan.  Patient's medication to be e-prescribed to pharmacy of choice.  1. Generalized anxiety disorder  - escitalopram (LEXAPRO) 10 MG tablet; Take 1 tablet (10 mg total) by mouth daily. Patient to take 10 mg for 3 days, followed by 5 mg (half  tablet) for 3 more days before discontinuing  Dispense: 6 tablet; Refill: 0 - FLUoxetine (PROZAC) 20 MG capsule; Take 1 capsule (20 mg total) by mouth daily.  Dispense: 30 capsule; Refill: 2  2. Severe episode of recurrent major depressive disorder, with psychotic features (HCC)  - escitalopram (LEXAPRO) 10 MG tablet; Take 1 tablet (10 mg total) by mouth daily. Patient to take 10 mg for 3 days, followed by 5 mg (half tablet) for 3 more days before discontinuing  Dispense: 6 tablet; Refill: 0 - FLUoxetine (PROZAC) 20 MG capsule; Take 1 capsule (20 mg total) by mouth daily.  Dispense: 30 capsule; Refill: 2  3. Panic disorder  - escitalopram (LEXAPRO) 10 MG tablet; Take 1 tablet (10 mg total) by mouth daily. Patient to take 10 mg for 3 days, followed by 5 mg (half tablet) for 3 more days before discontinuing  Dispense: 6 tablet; Refill: 0 - FLUoxetine (PROZAC) 20 MG capsule; Take 1 capsule (20 mg total) by mouth daily.  Dispense: 30 capsule; Refill: 2  4. Visual hallucinations  - OLANZapine (ZYPREXA) 10 MG tablet; Take 1 tablet (10 mg total) by mouth at bedtime.  Dispense: 30 tablet; Refill: 1  5. Insomnia due to other mental disorder  - OLANZapine (ZYPREXA) 10 MG tablet; Take 1 tablet (10 mg total) by mouth at bedtime.  Dispense: 30 tablet; Refill: 1  Patient to follow up in 6 weeks Provider spent a total of 30 minutes with the patient/reviewing patient's chart  Meta HatchetUchenna E Maan Zarcone, PA 02/19/2021, 8:47 AM

## 2021-02-25 ENCOUNTER — Other Ambulatory Visit: Payer: Self-pay | Admitting: Family

## 2021-02-25 DIAGNOSIS — Z21 Asymptomatic human immunodeficiency virus [HIV] infection status: Secondary | ICD-10-CM

## 2021-03-03 ENCOUNTER — Other Ambulatory Visit (HOSPITAL_COMMUNITY): Payer: Self-pay | Admitting: *Deleted

## 2021-03-17 ENCOUNTER — Ambulatory Visit: Payer: Self-pay | Admitting: Family

## 2021-03-30 ENCOUNTER — Telehealth (HOSPITAL_COMMUNITY): Payer: Self-pay | Admitting: Physician Assistant

## 2021-03-30 NOTE — Telephone Encounter (Signed)
Patient reports PROZAC caused rash and shortness of breath. Pt started taking LEXAPRO again. Please call in to [facility]

## 2021-04-05 ENCOUNTER — Other Ambulatory Visit: Payer: Self-pay

## 2021-04-05 ENCOUNTER — Ambulatory Visit (HOSPITAL_COMMUNITY)
Admission: EM | Admit: 2021-04-05 | Discharge: 2021-04-05 | Disposition: A | Discharge status: Home / Self Care | Payer: No Payment, Other

## 2021-04-05 DIAGNOSIS — F331 Major depressive disorder, recurrent, moderate: Secondary | ICD-10-CM

## 2021-04-05 NOTE — BHH Counselor (Signed)
Triage Note:   Patient present to [facility] requesting a re-fill for his Lexapro. He stated there was not refill at the pharmacy. Hillery Jacks, NP, assisted the patient by calling [facility] to verify if the patient had the medication on fill. Per chart review it showed the medication was called into the pharmacy on 03/30/2021. T. Melvyn Neth, NP, refilled the medication for the patient at the [facility] on [address].   Patient denied additional mental health concerns. Denied suicidal/homicidal ideations, denied auditory/visual hallucinations. Report he felt that his sleeping medication needed to be adjusted. He next appointment with PA Link Snuffer is on 04/09/2021 @ 2:45.   Interpreter [id number] translated signed language.

## 2021-04-05 NOTE — Discharge Instructions (Addendum)
Take all medications as prescribed. Keep all follow-up appointments as scheduled.  Do not consume alcohol or use illegal drugs while on prescription medications. Report any adverse effects from your medications to your primary care provider promptly.  In the event of recurrent symptoms or worsening symptoms, call 911, a crisis hotline, or go to the nearest emergency department for evaluation.   

## 2021-04-05 NOTE — Progress Notes (Signed)
   04/05/21 1018  [facility] Triage Screening (Walk-ins at [facility] only)  How Did You Hear About [place]? Self  What Is the Reason for Your Visit/Call Today? Patient present to the [facility] requesting a medication refill on his Lexapro. Stated there was not refill for the medication at the pharmacy. Patient's cannot hear, Interpreter for sign lanuage [id number] used to assist with communication.  How Long Has This Been Causing You Problems? <Week  Have You Recently Had Any Thoughts About Hurting Yourself? No  Are You Planning to Commit Suicide/Harm Yourself At This time? No  Have you Recently Had Thoughts About Hurting Someone Karolee Ohs? No  Are You Planning To Harm Someone At This Time? No  Are you currently experiencing any auditory, visual or other hallucinations? No  Have You Used Any Alcohol or Drugs in the Past 24 Hours? No  Do you have any current medical co-morbidities that require immediate attention? No  Clinician description of patient physical appearance/behavior: Patien pleasant in a pleasant manner and dressed appropriately for the weather.  What Do You Feel Would Help You the Most Today? Medication(s)  If access to [facility] was not available, would you have sought care in the Emergency Department? Yes  Determination of Need Routine (7 days)

## 2021-04-05 NOTE — ED Provider Notes (Signed)
Behavioral Health Urgent Care Medical Screening Exam  Patient Name: Tyler Horn MRN: [id number] Date of Evaluation: 04/05/21 Chief Complaint:   Diagnosis:  Final diagnoses:  MDD (major depressive disorder), recurrent episode, moderate (HCC)    History of Present illness: Tyler Horn is a 60 y.o. male.  Presents to [facility] urgent care requesting medication refills to his Lexapro.  Translation was provided by American sign language.  He denied suicidal or homicidal ideations.  Denies auditory or visual hallucinations.  States he has been out of medication for the past 3 days. NP will make 30-day supply of Lexapro available with no additional refills.  Patient has a follow-up appointment on 04/09/2021.  Patient reported sleeping concerns with Seroquel, however was advised to follow-up with his outpatient provider. Support, encouragement and reassurance was provided.    Psychiatric Specialty Exam  Presentation  General Appearance:Appropriate for Environment  Eye Contact:Good  Speech:-- (ASL) Speech Volume:No data recorded Handedness:Ambidextrous  Mood and Affect  Mood: Anxious; Depressed Affect: Congruent  Thought Process  Thought Processes: Coherent Descriptions of Associations:Intact Orientation:Full (Time, Place and Person) Thought Content:Logical Diagnosis of Schizophrenia or Schizoaffective disorder in past: No  Duration of Psychotic Symptoms: Less than six months  Hallucinations:None Ideas of Reference:None Suicidal Thoughts:No Homicidal Thoughts:No  Sensorium  Memory: Recent Fair; Remote Fair; Immediate Fair Judgment: Fair Insight: Fair  Art therapist  Concentration: Fair Attention Span: Fair Recall: [employer] of Knowledge: Fair Language: Fair  Psychomotor Activity  Psychomotor Activity: Normal  Assets  Assets: Social Support; Communication Skills  Sleep  Sleep: Fair Number of hours:  No data recorded  Nutritional  Assessment (For OBS and [facility]BC admissions only) Has the patient had a weight loss or gain of 10 pounds or more in the last 3 months?: No Has the patient had a decrease in food intake/or appetite?: No Does the patient have dental problems?: No Does the patient have eating habits or behaviors that may be indicators of an eating disorder including binging or inducing vomiting?: No Has the patient recently lost weight without trying?: No Has the patient been eating poorly because of a decreased appetite?: No Malnutrition Screening Tool Score: 0   Physical Exam: Physical Exam Eyes:     Pupils: Pupils are equal, round, and reactive to light.  Cardiovascular:     Rate and Rhythm: Normal rate and regular rhythm.  Pulmonary:     Effort: Pulmonary effort is normal.     Breath sounds: Normal breath sounds.  Neurological:     Mental Status: He is alert and oriented to person, place, and time.  Psychiatric:        Attention and Perception: Attention and perception normal.        Mood and Affect: Mood is anxious and depressed.        Behavior: Behavior normal. Behavior is cooperative.        Thought Content: Thought content normal.        Judgment: Judgment normal.   Review of Systems  HENT: Negative.    Eyes: Negative.   Cardiovascular: Negative.   Musculoskeletal: Negative.   Skin: Negative.   Endo/Heme/Allergies: Negative.   Psychiatric/Behavioral:  Positive for depression. Negative for suicidal ideas. The patient is nervous/anxious and has insomnia.   All other systems reviewed and are negative. Blood pressure 112/82, pulse (!) 105, temperature 98.1 F (36.7 C), temperature source Oral, resp. rate 16, SpO2 96 %. There is no height or weight on file to calculate BMI.  Musculoskeletal: Strength & Muscle  Tone: within normal limits Gait & Station: normal Patient leans: N/A   [facility] MSE Discharge Disposition for Follow up and Recommendations: Based on my evaluation the patient does not  appear to have an emergency medical condition and can be discharged with resources and follow up care in outpatient services for Medication Management and Individual Therapy    Verbal order for Lexapro 20 mg daily, 60 tablets no refills was called to [employer], [place].    Oneta Rack, NP 04/05/2021, 10:34 AM

## 2021-04-09 ENCOUNTER — Other Ambulatory Visit: Payer: Self-pay

## 2021-04-09 ENCOUNTER — Ambulatory Visit (INDEPENDENT_AMBULATORY_CARE_PROVIDER_SITE_OTHER): Payer: No Payment, Other | Admitting: Physician Assistant

## 2021-04-09 DIAGNOSIS — F5105 Insomnia due to other mental disorder: Secondary | ICD-10-CM

## 2021-04-09 DIAGNOSIS — F410 Panic disorder [episodic paroxysmal anxiety] without agoraphobia: Secondary | ICD-10-CM | POA: Diagnosis not present

## 2021-04-09 DIAGNOSIS — R441 Visual hallucinations: Secondary | ICD-10-CM | POA: Diagnosis not present

## 2021-04-09 DIAGNOSIS — F411 Generalized anxiety disorder: Secondary | ICD-10-CM

## 2021-04-09 DIAGNOSIS — F99 Mental disorder, not otherwise specified: Secondary | ICD-10-CM

## 2021-04-09 DIAGNOSIS — F333 Major depressive disorder, recurrent, severe with psychotic symptoms: Secondary | ICD-10-CM

## 2021-04-09 MED ORDER — OLANZAPINE 10 MG PO TABS: 10.0000 mg | ORAL_TABLET | Freq: Every day | ORAL | 1 refills | Status: DC

## 2021-04-09 MED ORDER — ESCITALOPRAM OXALATE 20 MG PO TABS: 40.0000 mg | ORAL_TABLET | Freq: Every day | ORAL | 1 refills | Status: DC

## 2021-04-09 NOTE — Progress Notes (Addendum)
BH MD/PA/NP OP Progress Note  04/09/2021 5:46 PM Elye Harmsen  MRN:  [id number]  Chief Complaint:  Chief Complaint   Medication Management    HPI:   Jayvien Rowlette is a 60 year old male with a past psychiatric history significant for visual hallucinations, generalized anxiety disorder, major depressive disorder, and panic disorder who presents to [facility] behavioral health outpatient clinic for follow-up and medication management.  Language interpretive services were utilized during the encounter due to patient's use of sign language to communicate.  Patient is currently being managed on the following medications:  Prozac 20 mg daily Olanzapine 10 mg at bedtime  During the last encounter, patient was tapered off his escitalopram and placed on Prozac.  Today, patient reports that Prozac made his symptoms worse.  He reports that he missed many appointments due to being on Prozac.  Patient reports that due to his worsening symptoms, he presented to the urgent care for a refill for Lexapro 20 mg daily.  Patient states that his symptoms have improved some since being placed on Lexapro.  Patient reports that he has a job interview for a Veterinary surgeon position.  Patient hopes to obtain the position, work for a year, then transfer to [place] due to family being close by.  A PHQ-9 screen was performed with the patient scoring an 11.  A GAD-7 screen was performed with the patient scoring of 7.  Patient is alert and oriented x4, calm, cooperative, and fully engaged during the encounter.  Patient is able to answer all questions positioned to him.  Patient reports that his mood is so-so.  He states that he plans on working on getting part-time work.  Patient denies suicidal or homicidal ideations.  He further denies auditory or visual hallucinations and does not appear to be responding to internal/external stimuli.  Patient endorses good sleep and receives on average 8 to 9 hours of sleep each night.   Patient endorses good appetite and eats on average 3-4 meals per day.  Patient denies alcohol consumption, tobacco use, and illicit drug use.  Visit Diagnosis:    ICD-10-CM   1. Visual hallucinations  R44.1 OLANZapine (ZYPREXA) 10 MG tablet    2. Insomnia due to other mental disorder  F51.05 OLANZapine (ZYPREXA) 10 MG tablet   F99     3. Generalized anxiety disorder  F41.1 escitalopram (LEXAPRO) 20 MG tablet    4. Severe episode of recurrent major depressive disorder, with psychotic features (HCC)  F33.3 escitalopram (LEXAPRO) 20 MG tablet    5. Panic disorder  F41.0 escitalopram (LEXAPRO) 20 MG tablet      Past Psychiatric History:  Generalized anxiety disorder Major depressive disorder Panic disorder Visual hallucinations  Past Medical History:  Past Medical History:  Diagnosis Date   Anxiety    Depression    HIV (human immunodeficiency virus infection) (HCC)    HIV (human immunodeficiency virus infection) (HCC)     Past Surgical History:  Procedure Laterality Date   CATARACT EXTRACTION Left     Family Psychiatric History:  Family history of psychiatric illness unknown  Family History:  Family History  Problem Relation Age of Onset   Mental illness Mother    Bipolar disorder Brother     Social History:  Social History   Socioeconomic History   Marital status: Single    Spouse name: Not on file   Number of children: Not on file   Years of education: Not on file   Highest education level: Not on  file  Occupational History   Not on file  Tobacco Use   Smoking status: Never   Smokeless tobacco: Never  Substance and Sexual Activity   Alcohol use: No   Drug use: No   Sexual activity: Yes  Other Topics Concern   Not on file  Social History Narrative   Not on file   Social Determinants of Health   Financial Resource Strain: Not on file  Food Insecurity: Not on file  Transportation Needs: Not on file  Physical Activity: Not on file  Stress: Not on  file  Social Connections: Not on file    Allergies: No Known Allergies  Metabolic Disorder Labs: Lab Results  Component Value Date   HGBA1C 5.1 10/18/2020   MPG 99.67 10/18/2020   MPG 99.67 07/15/2017   No results found for: PROLACTIN Lab Results  Component Value Date   CHOL 142 10/18/2020   TRIG 52 10/18/2020   HDL 39 (L) 10/18/2020   CHOLHDL 3.6 10/18/2020   VLDL 10 10/18/2020   LDLCALC 93 10/18/2020   LDLCALC 123 (H) 11/28/2019   Lab Results  Component Value Date   TSH 2.025 10/18/2020   TSH 0.803 11/28/2019    Therapeutic Level Labs: No results found for: LITHIUM No results found for: VALPROATE No components found for:  CBMZ  Current Medications: Current Outpatient Medications  Medication Sig Dispense Refill   bictegravir-emtricitabine-tenofovir AF (BIKTARVY) 50-200-25 MG TABS tablet Take 1 tablet by mouth daily. 30 tablet 5   clonazePAM (KLONOPIN) 0.5 MG tablet Take 0.5 tablets (0.25 mg total) by mouth 2 (two) times daily as needed for anxiety. 10 tablet 0   ibuprofen (ADVIL) 200 MG tablet Take 200 mg by mouth every 6 (six) hours as needed for fever or pain.     Lactobacillus (PROBIOTIC ACIDOPHILUS PO) Take 1 tablet by mouth daily.     Omega-3 Fatty Acids (FISH OIL) 1000 MG CAPS Take 1,000 mg by mouth daily.     oxybutynin (DITROPAN-XL) 10 MG 24 hr tablet Take 10 mg by mouth daily.     psyllium (METAMUCIL) 58.6 % packet Take 1 packet by mouth daily as needed (fiber supplement).     tamsulosin (FLOMAX) 0.4 MG CAPS capsule Take 1 capsule (0.4 mg total) by mouth daily. 30 capsule 0   escitalopram (LEXAPRO) 20 MG tablet Take 2 tablets (40 mg total) by mouth daily. 60 tablet 1   OLANZapine (ZYPREXA) 10 MG tablet Take 1 tablet (10 mg total) by mouth at bedtime. 30 tablet 1   No current facility-administered medications for this visit.     Musculoskeletal: Strength & Muscle Tone: within normal limits Gait & Station: normal Patient leans: N/A  Psychiatric  Specialty Exam: Review of Systems  Psychiatric/Behavioral:  Negative for decreased concentration, dysphoric mood, hallucinations, self-injury, sleep disturbance and suicidal ideas. The patient is nervous/anxious. The patient is not hyperactive.    Blood pressure 120/69, pulse 72, height 6\' 4"  (1.93 m), weight 202 lb (91.6 kg).Body mass index is 24.59 kg/m.  General Appearance: Well Groomed  Eye Contact:  Good  Speech:  Unable to assess due to patient utilizing sign language  Volume:  Unable to assess due to patient utilizing sign language  Mood:  Anxious and Depressed  Affect:  Congruent and Depressed  Thought Process:  Coherent, Goal Directed, and Descriptions of Associations: Intact  Orientation:  Full (Time, Place, and Person)  Thought Content: WDL   Suicidal Thoughts:  No  Homicidal Thoughts:  No  Memory:  Immediate;   Good Recent;   Fair Remote;   Fair  Judgement:  Good  Insight:  Fair  Psychomotor Activity:  Normal  Concentration:  Concentration: Good and Attention Span: Good  Recall:  Good  Fund of Knowledge: Fair  Language: Unable to assess, patient utilizes sign language  Akathisia:  NA  Handed:  Right  AIMS (if indicated): not done  Assets:  Communication Skills Desire for Improvement Social Support  ADL's:  Intact  Cognition: WNL  Sleep:  Good   Screenings: AIMS    Flowsheet Row [facility]) [facility]  AIMS Total Score 0 0      AUDIT    Flowsheet Row [facility]) [facility]Dis[facility]2019 [facility] Ad[facility] ([facility]  Alcohol Use Disorder Identification Test Final Score (AUDIT) 0 0 0      GAD-7    Flowsheet Row Office Visit  from 04/09/2021 in [facility] Office Visit from 02/19/2021 in [facility] Office Visit from 01/07/2021 in [facility] Office Visit from 12/09/2020 in [facility]  Total GAD-7 Score 7 20 16 17       PHQ2-9    Flowsheet Row Office Visit from 04/09/2021 in [facility] Office Visit from 02/19/2021 in [name] Office Visit from 01/07/2021 in [facility] Office Visit from 12/18/2020 in [facility] Office Visit from 12/09/2020 in [facility]  PHQ-2 Total Score 4 5 6 1 4   PHQ-9 Total Score 11 20 13  -- 18      Flowsheet Row Office Visit from 04/09/2021 in [facility] Office Visit from 02/19/2021 in [facility] Office Visit from 01/07/2021 in [facility]  C-SSRS RISK CATEGORY No Risk Low Risk No Risk        Assessment and Plan:   Dunbar Buras is a 60 year old male with a past psychiatric history significant for visual hallucinations, generalized anxiety disorder, major depressive disorder, and panic disorder who presents to [name] behavioral health outpatient clinic for follow-up and medication management.  Language interpretive services were utilized during the encounter due to patient's use of sign language to communicate.  Patient reports that his symptoms only worsened when taking Prozac.  Patient has discontinued taking Prozac and is now taking Lexapro 20 mg daily.  He reports that the medication has been more helpful in managing his depression and anxiety.  Patient would like to continue taking olanzapine 10 mg at bedtime.  Patient's medications to be e-prescribed to pharmacy of choice.  1. Visual hallucinations  - OLANZapine (ZYPREXA) 10 MG  tablet; Take 1 tablet (10 mg total) by mouth at bedtime.  Dispense: 30 tablet; Refill: 1  2. Insomnia due to other mental disorder  - OLANZapine (ZYPREXA) 10 MG tablet; Take 1 tablet (10 mg total) by mouth at bedtime.  Dispense: 30 tablet; Refill: 1  3. Generalized anxiety disorder  - escitalopram (LEXAPRO) 20 MG tablet; Take 2 tablets (40 mg total) by mouth daily.  Dispense: 60 tablet; Refill: 1  4. Severe episode of recurrent major depressive disorder, with psychotic features (HCC)  - escitalopram (LEXAPRO)  20 MG tablet; Take 2 tablets (40 mg total) by mouth daily.  Dispense: 60 tablet; Refill: 1  5. Panic disorder  - escitalopram (LEXAPRO) 20 MG tablet; Take 2 tablets (40 mg total) by mouth daily.  Dispense: 60 tablet; Refill: 1  Patient to follow up in 2 months Provider spent a total of 25 minutes with the patient/reviewing patient's chart  Meta Hatchet, PA 04/09/2021, 5:46 PM

## 2021-04-12 ENCOUNTER — Encounter (HOSPITAL_COMMUNITY): Payer: Self-pay | Admitting: Physician Assistant

## 2021-04-14 ENCOUNTER — Ambulatory Visit: Payer: Self-pay

## 2021-04-14 ENCOUNTER — Other Ambulatory Visit: Payer: Self-pay

## 2021-04-14 ENCOUNTER — Ambulatory Visit (INDEPENDENT_AMBULATORY_CARE_PROVIDER_SITE_OTHER): Payer: Self-pay | Admitting: Family

## 2021-04-14 ENCOUNTER — Encounter: Payer: Self-pay | Admitting: Family

## 2021-04-14 VITALS — BP 139/94 | HR 85 | Temp 97.7°F | Wt 200.0 lb

## 2021-04-14 DIAGNOSIS — Z21 Asymptomatic human immunodeficiency virus [HIV] infection status: Secondary | ICD-10-CM

## 2021-04-14 DIAGNOSIS — F411 Generalized anxiety disorder: Secondary | ICD-10-CM

## 2021-04-14 DIAGNOSIS — Z0000 Encounter for general adult medical examination without abnormal findings: Secondary | ICD-10-CM

## 2021-04-14 MED ORDER — BIKTARVY 50-200-25 MG PO TABS: 1.0000 | ORAL_TABLET | Freq: Every day | ORAL | 5 refills | Status: DC

## 2021-04-14 NOTE — Assessment & Plan Note (Signed)
   Discussed importance of safe sexual practice to reduce risk of STI.  Condoms offered.  Routine vaccinations up-to-date per recommendations. 

## 2021-04-14 NOTE — Patient Instructions (Addendum)
Nice to see you.  We will check your lab work today.  Continue to take your medication daily.  Refills have been sent to the pharmacy.  Recommend follow up with psychiatry for any medication changes.   Plan for follow up in 4 months or sooner if needed.   Hope you are feeling better.

## 2021-04-14 NOTE — Assessment & Plan Note (Signed)
Mr. Langenderfer continues to have well-controlled HIV disease with good adherence and tolerance to his ART regimen of Biktarvy.  No signs/symptoms of opportunistic infection.  We reviewed previous lab work and discussed plan of care.  Check blood work today.  Continue current dose of Biktarvy.  Plan for follow-up in 4 months or sooner if needed with lab work on the same day.

## 2021-04-14 NOTE — Assessment & Plan Note (Signed)
Mr. Minnehan continues to have generalized anxiety disorder including major depressive disorder and currently engaged with psychiatry.  Describes having increased symptoms of anxiety and brain fog with decreased ability to sleep since medication changes.  Recommend follow-up with psychiatry provider for any medication adjustments.

## 2021-04-14 NOTE — Progress Notes (Signed)
Brief Narrative   Patient ID: Tyler Horn, male    DOB: [birth year], 60 y.o.   MRN: [id number]  Tyler Horn is a 60 year old Caucasian male diagnosed with HIV-1 in 2005 with risk factor of MSM.  CD4 nadir and initial viral load are unknown.  [id number] negative. Came to clinic on nevirapine and Truvada and switched to Tyler Horn.  No history of opportunistic infection.  Subjective:    Chief Complaint  Patient presents with   Follow-up    B20 - Pt reports shaking and physically feels lethargic and brain fog.     HPI:  Tyler Horn is a 60 y.o. male with HIV disease last seen on 12/18/2020 with well-controlled virus and good adherence and tolerance to his ART regimen of Biktarvy.  Viral load was undetectable with CD4 count of 425.  Subsequently has been seen in the emergency department for major depressive disorder and continues to see psychiatry who is making medication adjustments.  Here today for routine follow-up.  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  Tyler Horn continues to take his Biktarvy daily as prescribed with no adverse side effects or missed doses since his last office visit.  He has been having increased levels of anxiety as well as shaking since he was last seen by his psychiatry provider with changes of medication having him feeling confused with trouble sleeping.  Decreased sleep has not been resolved with Seroquel or olanzapine and is requesting something to help him relax.  Also remains in care with urology for bladder spasms and is hoping to have the catheter removed next week. Denies fevers, chills, night sweats, headaches, changes in vision, neck pain/stiffness, nausea, diarrhea, vomiting, lesions or rashes.  Tyler Horn has no problems obtaining medication from pharmacy and has renewed his financial assistance.  No current recreational or illicit drug use, tobacco use, or alcohol consumption.   No Known  Allergies    Outpatient Medications Prior to Visit  Medication Sig Dispense Refill   escitalopram (LEXAPRO) 20 MG tablet Take 2 tablets (40 mg total) by mouth daily. 60 tablet 1   OLANZapine (ZYPREXA) 10 MG tablet Take 1 tablet (10 mg total) by mouth at bedtime. 30 tablet 1   bictegravir-emtricitabine-tenofovir AF (BIKTARVY) 50-200-25 MG TABS tablet Take 1 tablet by mouth daily. 30 tablet 5   clonazePAM (KLONOPIN) 0.5 MG tablet Take 0.5 tablets (0.25 mg total) by mouth 2 (two) times daily as needed for anxiety. (Patient not taking: Reported on 04/14/2021) 10 tablet 0   ibuprofen (ADVIL) 200 MG tablet Take 200 mg by mouth every 6 (six) hours as needed for fever or pain. (Patient not taking: Reported on 04/14/2021)     Lactobacillus (PROBIOTIC ACIDOPHILUS PO) Take 1 tablet by mouth daily. (Patient not taking: Reported on 04/14/2021)     Omega-3 Fatty Acids (FISH OIL) 1000 MG CAPS Take 1,000 mg by mouth daily. (Patient not taking: Reported on 04/14/2021)     oxybutynin (DITROPAN-XL) 10 MG 24 hr tablet Take 10 mg by mouth daily. (Patient not taking: Reported on 04/14/2021)     psyllium (METAMUCIL) 58.6 % packet Take 1 packet by mouth daily as needed (fiber supplement). (Patient not taking: Reported on 04/14/2021)     tamsulosin (FLOMAX) 0.4 MG CAPS capsule Take 1 capsule (0.4 mg total) by mouth daily. (Patient not taking: Reported on 04/14/2021) 30 capsule 0   No facility-administered medications prior to visit.     Past Medical History:  Diagnosis Date   Anxiety    Chronic hepatitis C (HCC) 04/27/2020   Depression    HIV (human immunodeficiency virus infection) (HCC)    HIV (human immunodeficiency virus infection) (HCC)      Past Surgical History:  Procedure Laterality Date   CATARACT EXTRACTION Left       Review of Systems  Constitutional:  Negative for appetite change, chills, fatigue, fever and unexpected weight change.  Eyes:  Negative for visual disturbance.  Respiratory:  Negative  for cough, chest tightness, shortness of breath and wheezing.   Cardiovascular:  Negative for chest pain and leg swelling.  Gastrointestinal:  Negative for abdominal pain, constipation, diarrhea, nausea and vomiting.  Genitourinary:  Negative for dysuria, flank pain, frequency, genital sores, hematuria and urgency.  Skin:  Negative for rash.  Allergic/Immunologic: Negative for immunocompromised state.  Neurological:  Positive for tremors. Negative for dizziness and headaches.  Psychiatric/Behavioral:  Positive for decreased concentration and sleep disturbance. Negative for self-injury and suicidal ideas. The patient is nervous/anxious.      Objective:    BP (!) 139/94   Pulse 85   Temp 97.7 F (36.5 C) (Oral)   Wt 200 lb (90.7 kg)   SpO2 95%   BMI 24.34 kg/m  Nursing note and vital signs reviewed.  Physical Exam Constitutional:      General: He is not in acute distress.    Appearance: He is well-developed.     Comments: Seated in the chair; appears anxious and restless  Cardiovascular:     Rate and Rhythm: Normal rate and regular rhythm.     Heart sounds: Normal heart sounds.  Pulmonary:     Effort: Pulmonary effort is normal.     Breath sounds: Normal breath sounds.  Skin:    General: Skin is warm and dry.  Neurological:     Mental Status: He is alert and oriented to person, place, and time.  Psychiatric:        Mood and Affect: Mood is anxious.     Depression screen Tyler Horn 2/9 04/09/2021 02/19/2021 01/07/2021 12/18/2020 12/09/2020  Decreased Interest 2 2 3  0 2  Down, Depressed, Hopeless 2 3 3 1 2   PHQ - 2 Score 4 5 6 1 4   Altered sleeping 1 2 0 - 1  Tired, decreased energy 1 3 2  - 2  Change in appetite 1 2 0 - 2  Feeling bad or failure about yourself  2 3 2  - 2  Trouble concentrating 1 3 3  - 2  Moving slowly or fidgety/restless 1 0 0 - 3  Suicidal thoughts 0 2 (No Data) - 2  PHQ-9 Score 11 20 13  - 18  Difficult doing work/chores Somewhat difficult Somewhat difficult Not  difficult at all - Very difficult       Assessment & Plan:    Patient Active Problem List   Diagnosis Date Noted   Insomnia due to other mental disorder 02/19/2021   Panic disorder 01/07/2021   Severe episode of recurrent major depressive disorder, with psychotic features (HCC) 10/16/2020   Vision changes 07/09/2020   Weight loss 07/09/2020   Idiopathic peripheral neuropathy 04/27/2020   Herpes simplex 04/27/2020   Herpes zoster 04/27/2020   Healthcare maintenance 12/19/2019   Early syphilis, 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 Hotz continues to have generalized anxiety disorder including major depressive disorder and currently engaged with psychiatry.  Describes having increased symptoms of anxiety and brain fog with decreased ability to sleep since medication changes.  Recommend follow-up with psychiatry provider for any medication adjustments.      Human immunodeficiency virus infection (HCC) - Primary    Mr. Melendrez continues to have well-controlled HIV disease with good adherence and tolerance to his ART regimen of Biktarvy.  No signs/symptoms of opportunistic infection.  We reviewed previous lab work and discussed plan of care.  Check blood work today.  Continue current dose of Biktarvy.  Plan for follow-up in 4 months or sooner if needed with lab work on the same day.      Relevant Medications    bictegravir-emtricitabine-tenofovir AF (BIKTARVY) 50-200-25 MG TABS tablet   Other Relevant Orders   COMPLETE METABOLIC PANEL WITH GFR   T-helper cell (CD4)- ([facility] clinic only)   HIV-1 RNA quant-no reflex-bld   Healthcare maintenance    Discussed importance of safe sexual practice to reduce risk of STI.  Condoms offered. Routine vaccinations up-to-date per recommendations.        I am having Antoneo Lavelle maintain his Lactobacillus (PROBIOTIC ACIDOPHILUS PO), psyllium, ibuprofen, Fish Oil, tamsulosin, oxybutynin, clonazePAM, OLANZapine, escitalopram, and Biktarvy.   Meds ordered this encounter  Medications   bictegravir-emtricitabine-tenofovir AF (BIKTARVY) 50-200-25 MG TABS tablet    Sig: Take 1 tablet by mouth daily.    Dispense:  30 tablet    Refill:  5    Order Specific Question:   Supervising Provider    Answer:   Judyann Munson [[id number]]     Follow-up: Return in about 4 months (around 08/14/2021), or if symptoms worsen or fail to improve.   Marcos Eke, MSN, FNP-C Nurse Practitioner [facility] Main number: [phone]

## 2021-04-15 LAB — T-HELPER CELL (CD4) - (RCID CLINIC ONLY)
CD4 % Helper T Cell: 33 % (ref 33–65)
CD4 T Cell Abs: 839 /uL (ref 400–1790)

## 2021-04-16 LAB — COMPLETE METABOLIC PANEL WITH GFR
AG Ratio: 1.4 (calc) (ref 1.0–2.5)
ALT: 12 U/L (ref 9–46)
AST: 13 U/L (ref 10–35)
Albumin: 4.4 g/dL (ref 3.6–5.1)
Alkaline phosphatase (APISO): 108 U/L (ref 35–144)
BUN: 16 mg/dL (ref 7–25)
CO2: 29 mmol/L (ref 20–32)
Calcium: 9.7 mg/dL (ref 8.6–10.3)
Chloride: 104 mmol/L (ref 98–110)
Creat: 1.13 mg/dL (ref 0.70–1.35)
Globulin: 3.2 g/dL (calc) (ref 1.9–3.7)
Glucose, Bld: 77 mg/dL (ref 65–99)
Potassium: 4.1 mmol/L (ref 3.5–5.3)
Sodium: 141 mmol/L (ref 135–146)
Total Bilirubin: 0.7 mg/dL (ref 0.2–1.2)
Total Protein: 7.6 g/dL (ref 6.1–8.1)
eGFR: 74 mL/min/{1.73_m2} (ref >=60)

## 2021-04-16 LAB — HIV-1 RNA QUANT-NO REFLEX-BLD
HIV 1 RNA Quant: <20 Copies/mL — ABNORMAL HIGH
HIV-1 RNA Quant, Log: <1.3 Log cps/mL — ABNORMAL HIGH

## 2021-04-21 ENCOUNTER — Other Ambulatory Visit: Payer: Self-pay

## 2021-04-21 ENCOUNTER — Ambulatory Visit: Payer: Self-pay

## 2021-04-27 ENCOUNTER — Other Ambulatory Visit: Payer: Self-pay | Admitting: Family

## 2021-04-29 NOTE — Telephone Encounter (Signed)
Medication acknowledged and reviewed by provider. Provider was able to discuss patient's medication regimen during previous follow up appointment.

## 2021-05-01 ENCOUNTER — Other Ambulatory Visit: Payer: Self-pay | Admitting: Family

## 2021-05-03 ENCOUNTER — Other Ambulatory Visit: Payer: Self-pay

## 2021-05-03 ENCOUNTER — Ambulatory Visit (HOSPITAL_COMMUNITY)
Admission: EM | Admit: 2021-05-03 | Discharge: 2021-05-03 | Disposition: A | Discharge status: Home / Self Care | Payer: No Payment, Other | Attending: Behavioral Health | Admitting: Behavioral Health

## 2021-05-03 ENCOUNTER — Encounter (HOSPITAL_COMMUNITY): Payer: Self-pay | Admitting: Behavioral Health

## 2021-05-03 DIAGNOSIS — F410 Panic disorder [episodic paroxysmal anxiety] without agoraphobia: Secondary | ICD-10-CM | POA: Insufficient documentation

## 2021-05-03 DIAGNOSIS — F333 Major depressive disorder, recurrent, severe with psychotic symptoms: Secondary | ICD-10-CM | POA: Insufficient documentation

## 2021-05-03 DIAGNOSIS — F419 Anxiety disorder, unspecified: Secondary | ICD-10-CM | POA: Insufficient documentation

## 2021-05-03 DIAGNOSIS — Z79899 Other long term (current) drug therapy: Secondary | ICD-10-CM | POA: Insufficient documentation

## 2021-05-03 MED ORDER — HYDROXYZINE HCL 25 MG PO TABS
25.0000 mg | ORAL_TABLET | Freq: Once | ORAL | Status: AC
  Administered 2021-05-03: 25 mg via ORAL

## 2021-05-03 MED ORDER — HYDROXYZINE HCL 25 MG PO TABS: 25.0000 mg | ORAL_TABLET | Freq: Four times a day (QID) | ORAL | 0 refills | Status: DC | PRN

## 2021-05-03 MED ORDER — HYDROXYZINE HCL 25 MG PO TABS
ORAL_TABLET | ORAL | Status: AC
  Filled 2021-05-03: qty 1

## 2021-05-03 NOTE — ED Notes (Signed)
Discharge instructions provided and Pt stated understanding. Pt alert, orient and ambulatory prior to d/c from facility. Personal belongings returned from the orange locker. Accepted PO med prior to d/c from facility. Escorted to the front lobby per staff. Safety maintained.

## 2021-05-03 NOTE — ED Provider Notes (Signed)
Behavioral Health Urgent Care Medical Screening Exam  Patient Name: Tyler Horn MRN: [id number] Date of Evaluation: 05/03/21 Chief Complaint:  Panic attack  Diagnosis:  Final diagnoses:  Panic attack  Panic disorder    History of Present illness: Tyler Horn is a 60 y.o. male patient presents to the [facility] voluntarily as a walk-in unaccompanied with a chief complaint of panic attack and needing medications adjusted.   Patient seen and evaluated face-to-face by this provider and chart reviewed. Patient was assessed using a translator provided by American sign language. On evaluation, the patient is alert and oriented x4. His mood is anxious and affect is congruent. He is noted to be breathing fast, fidgety and grabbing his chest. He reports via translator, that he is having a lot of anxiety and has been very depressed for the past two days. He describes his symptoms as "feeling like a truck ran over me and feeling tight." He states that his medications need to be adjusted. He states that he is currently off of his medications. When asked why is he off of his medications, he states, "I do not know."  He then states that he ran out of his medications. He was informed that he was seen by outpatient psychiatry here at the [facility] on 04/09/21 and was prescribed Lexapro and Zyprexa for his anxiety and depression with a refill. When asked if he has followed up with the pharmacy about his refills, he states that he was told that he needed approval. When asked which medication did he need an approval for, he states, "Seroquel."  He was advised that he is no longer prescribed Seroquel and that he prescribed Zyprexa in place of Seroquel He verbalizes understanding via Nurse, learning disability. We discussed the patient taking a 1 time dose of Vistaril 25 mg po once here at the urgent care for his current state/panic attack.  He was advised that  he would be provided with a prescription for Vistaril 25 mg po every 6 hours as needed for anxiety x4 days. He was advised that he will need to follow up with his outpatient provider for medication adjustments. He was provided with the days and times for open access at the [facility] outpatient.  He verbalizes understanding via Nurse, learning disability.    Psychiatric Specialty Exam  Presentation  General Appearance:Appropriate for Environment  Eye Contact:Fair  Speech:-- (sigh language)  Speech Volume:Other (comment) (sign language)  Handedness:Ambidextrous   Mood and Affect  Mood:Anxious  Affect:Congruent   Thought Process  Thought Processes:Coherent; Goal Directed  Descriptions of Associations:Intact  Orientation:Full (Time, Place and Person)  Thought Content:Logical  Diagnosis of Schizophrenia or Schizoaffective disorder in past: No  Duration of Psychotic Symptoms: Less than six months  Hallucinations:Visual "seeing things crawaling."  Ideas of Reference:None  Suicidal Thoughts:No  Homicidal Thoughts:No   Sensorium  Memory:Immediate Fair; Recent Fair; Remote Fair  Judgment:Intact  Insight:Present   Executive Functions  Concentration:Fair  Attention Span:Fair  Recall:Fair  Fund of Knowledge:Fair  Language:Fair   Psychomotor Activity  Psychomotor Activity:Increased   Assets  Assets:Desire for Improvement; Housing; Leisure Time; Physical Health   Sleep  Sleep:Fair  Number of hours: 8   No data recorded  Physical Exam: Physical Exam HENT:     Head: Normocephalic.  Eyes:     Conjunctiva/sclera: Conjunctivae normal.  Cardiovascular:     Rate and Rhythm: Normal rate.  Pulmonary:     Effort: Pulmonary effort is normal.  Musculoskeletal:  General: Normal range of motion.     Cervical back: Normal range of motion.  Neurological:     Mental Status: He is alert and oriented to person, place, and time.   Review of Systems  Constitutional:  Negative.   HENT: Negative.    Eyes: Negative.   Respiratory: Negative.    Cardiovascular:  Positive for palpitations.  Gastrointestinal: Negative.   Genitourinary: Negative.   Musculoskeletal: Negative.   Skin: Negative.   Neurological: Negative.   Endo/Heme/Allergies: Negative.   Psychiatric/Behavioral:  The patient is nervous/anxious.   Blood pressure 113/72, pulse 91, temperature 97.8 F (36.6 C), temperature source Oral, resp. rate 20, SpO2 97 %. There is no height or weight on file to calculate BMI.  Musculoskeletal: Strength & Muscle Tone: within normal limits Gait & Station: normal Patient leans: N/A   [facility] Discharge Disposition for Follow up and Recommendations: Based on my evaluation the patient does not appear to have an emergency medical condition and can be discharged with resources and follow up care in outpatient services for Medication Management, Individual Therapy, and Group Therapy  Vistaril 25 mg po every  6 hours as needed for anxiety x 4 sent electronically to the pharmacy on file.    Follow-up Information     Go to  [facility].   Specialty: Urgent Care Why: Open access walk-in hours Monday - Friday  8 am to 11 am for medication management. Contact information: [phone] [place] [address] [phone]                   Layla Barter, NP 05/03/2021, 8:47 AM

## 2021-05-03 NOTE — Discharge Instructions (Addendum)

## 2021-05-05 ENCOUNTER — Encounter (HOSPITAL_COMMUNITY): Payer: Self-pay | Admitting: Emergency Medicine

## 2021-05-05 ENCOUNTER — Emergency Department (HOSPITAL_COMMUNITY): Payer: Self-pay

## 2021-05-05 ENCOUNTER — Emergency Department (HOSPITAL_COMMUNITY)
Admission: EM | Admit: 2021-05-05 | Discharge: 2021-05-05 | Disposition: A | Discharge status: Other Acute Inpatient Hospital | Payer: Self-pay | Attending: Emergency Medicine | Admitting: Emergency Medicine

## 2021-05-05 ENCOUNTER — Inpatient Hospital Stay (HOSPITAL_COMMUNITY)
Admission: AD | Admit: 2021-05-05 | Discharge: 2021-05-12 | DRG: 885 | Disposition: A | Discharge status: Home / Self Care | Payer: Federal, State, Local not specified - Other | Source: Intra-hospital | Attending: Psychiatry | Admitting: Psychiatry

## 2021-05-05 ENCOUNTER — Other Ambulatory Visit: Payer: Self-pay

## 2021-05-05 ENCOUNTER — Encounter (HOSPITAL_COMMUNITY): Payer: Self-pay | Admitting: Psychiatry

## 2021-05-05 DIAGNOSIS — K5909 Other constipation: Secondary | ICD-10-CM | POA: Diagnosis present

## 2021-05-05 DIAGNOSIS — G4700 Insomnia, unspecified: Secondary | ICD-10-CM | POA: Diagnosis present

## 2021-05-05 DIAGNOSIS — N390 Urinary tract infection, site not specified: Secondary | ICD-10-CM | POA: Diagnosis present

## 2021-05-05 DIAGNOSIS — D72829 Elevated white blood cell count, unspecified: Secondary | ICD-10-CM | POA: Insufficient documentation

## 2021-05-05 DIAGNOSIS — Z21 Asymptomatic human immunodeficiency virus [HIV] infection status: Secondary | ICD-10-CM | POA: Insufficient documentation

## 2021-05-05 DIAGNOSIS — N400 Enlarged prostate without lower urinary tract symptoms: Secondary | ICD-10-CM | POA: Diagnosis present

## 2021-05-05 DIAGNOSIS — F329 Major depressive disorder, single episode, unspecified: Secondary | ICD-10-CM | POA: Insufficient documentation

## 2021-05-05 DIAGNOSIS — Y900 Blood alcohol level of less than 20 mg/100 ml: Secondary | ICD-10-CM | POA: Insufficient documentation

## 2021-05-05 DIAGNOSIS — B20 Human immunodeficiency virus [HIV] disease: Secondary | ICD-10-CM | POA: Diagnosis present

## 2021-05-05 DIAGNOSIS — F4310 Post-traumatic stress disorder, unspecified: Secondary | ICD-10-CM | POA: Diagnosis present

## 2021-05-05 DIAGNOSIS — F411 Generalized anxiety disorder: Secondary | ICD-10-CM | POA: Diagnosis present

## 2021-05-05 DIAGNOSIS — R45851 Suicidal ideations: Secondary | ICD-10-CM | POA: Diagnosis present

## 2021-05-05 DIAGNOSIS — F332 Major depressive disorder, recurrent severe without psychotic features: Secondary | ICD-10-CM | POA: Diagnosis not present

## 2021-05-05 DIAGNOSIS — R42 Dizziness and giddiness: Secondary | ICD-10-CM | POA: Insufficient documentation

## 2021-05-05 DIAGNOSIS — Z79899 Other long term (current) drug therapy: Secondary | ICD-10-CM | POA: Diagnosis not present

## 2021-05-05 DIAGNOSIS — F333 Major depressive disorder, recurrent, severe with psychotic symptoms: Principal | ICD-10-CM | POA: Diagnosis present

## 2021-05-05 DIAGNOSIS — Z20822 Contact with and (suspected) exposure to covid-19: Secondary | ICD-10-CM | POA: Insufficient documentation

## 2021-05-05 DIAGNOSIS — Z818 Family history of other mental and behavioral disorders: Secondary | ICD-10-CM

## 2021-05-05 DIAGNOSIS — F1390 Sedative, hypnotic, or anxiolytic use, unspecified, uncomplicated: Secondary | ICD-10-CM | POA: Diagnosis present

## 2021-05-05 DIAGNOSIS — F410 Panic disorder [episodic paroxysmal anxiety] without agoraphobia: Secondary | ICD-10-CM | POA: Diagnosis present

## 2021-05-05 DIAGNOSIS — R440 Auditory hallucinations: Secondary | ICD-10-CM | POA: Insufficient documentation

## 2021-05-05 LAB — URINALYSIS, ROUTINE W REFLEX MICROSCOPIC
Glucose, UA: NEGATIVE mg/dL
Hgb urine dipstick: NEGATIVE
Ketones, ur: NEGATIVE mg/dL
Nitrite: POSITIVE — AB
Protein, ur: >300 mg/dL — AB
Specific Gravity, Urine: >1.03 — ABNORMAL HIGH (ref 1.005–1.030)
pH: 6 (ref 5.0–8.0)

## 2021-05-05 LAB — CBC WITH DIFFERENTIAL/PLATELET
Abs Immature Granulocytes: 0.09 10*3/uL — ABNORMAL HIGH (ref 0.00–0.07)
Basophils Absolute: 0.1 10*3/uL (ref 0.0–0.1)
Basophils Relative: 0 %
Eosinophils Absolute: 0 10*3/uL (ref 0.0–0.5)
Eosinophils Relative: 0 %
HCT: 49.3 % (ref 39.0–52.0)
Hemoglobin: 17.6 g/dL — ABNORMAL HIGH (ref 13.0–17.0)
Immature Granulocytes: 1 %
Lymphocytes Relative: 19 %
Lymphs Abs: 2.4 10*3/uL (ref 0.7–4.0)
MCH: 32.3 pg (ref 26.0–34.0)
MCHC: 35.7 g/dL (ref 30.0–36.0)
MCV: 90.5 fL (ref 80.0–100.0)
Monocytes Absolute: 0.9 10*3/uL (ref 0.1–1.0)
Monocytes Relative: 7 %
Neutro Abs: 9.3 10*3/uL — ABNORMAL HIGH (ref 1.7–7.7)
Neutrophils Relative %: 73 %
Platelets: 482 10*3/uL — ABNORMAL HIGH (ref 150–400)
RBC: 5.45 MIL/uL (ref 4.22–5.81)
RDW: 13 % (ref 11.5–15.5)
WBC: 12.8 10*3/uL — ABNORMAL HIGH (ref 4.0–10.5)
nRBC: 0 % (ref 0.0–0.2)

## 2021-05-05 LAB — ETHANOL: Alcohol, Ethyl (B): <10 mg/dL (ref <10)

## 2021-05-05 LAB — RAPID URINE DRUG SCREEN, HOSP PERFORMED
Amphetamines: NOT DETECTED
Barbiturates: NOT DETECTED
Benzodiazepines: POSITIVE — AB
Cocaine: NOT DETECTED
Opiates: NOT DETECTED
Tetrahydrocannabinol: NOT DETECTED

## 2021-05-05 LAB — COMPREHENSIVE METABOLIC PANEL
ALT: 38 U/L (ref 0–44)
AST: 115 U/L — ABNORMAL HIGH (ref 15–41)
Albumin: 4.8 g/dL (ref 3.5–5.0)
Alkaline Phosphatase: 116 U/L (ref 38–126)
Anion gap: 14 (ref 5–15)
BUN: 24 mg/dL — ABNORMAL HIGH (ref 6–20)
CO2: 22 mmol/L (ref 22–32)
Calcium: 10.3 mg/dL (ref 8.9–10.3)
Chloride: 103 mmol/L (ref 98–111)
Creatinine, Ser: 1.36 mg/dL — ABNORMAL HIGH (ref 0.61–1.24)
GFR, Estimated: 60 mL/min — ABNORMAL LOW (ref >60)
Glucose, Bld: 77 mg/dL (ref 70–99)
Potassium: 3.4 mmol/L — ABNORMAL LOW (ref 3.5–5.1)
Sodium: 139 mmol/L (ref 135–145)
Total Bilirubin: 1.2 mg/dL (ref 0.3–1.2)
Total Protein: 8.7 g/dL — ABNORMAL HIGH (ref 6.5–8.1)

## 2021-05-05 LAB — RESP PANEL BY RT-PCR (FLU A&B, COVID) ARPGX2
Influenza A by PCR: NEGATIVE
Influenza B by PCR: NEGATIVE
SARS Coronavirus 2 by RT PCR: NEGATIVE

## 2021-05-05 LAB — URINALYSIS, MICROSCOPIC (REFLEX): WBC, UA: >50 WBC/hpf (ref 0–5)

## 2021-05-05 LAB — TROPONIN I (HIGH SENSITIVITY): Troponin I (High Sensitivity): 8 ng/L (ref <18)

## 2021-05-05 LAB — MAGNESIUM: Magnesium: 1.9 mg/dL (ref 1.7–2.4)

## 2021-05-05 MED ORDER — TRAZODONE HCL 50 MG PO TABS
50.0000 mg | ORAL_TABLET | Freq: Every evening | ORAL | Status: DC | PRN
  Administered 2021-05-08 – 2021-05-11 (×4): 50 mg via ORAL
  Filled 2021-05-05 (×5): qty 1

## 2021-05-05 MED ORDER — SODIUM CHLORIDE 0.9 % IV SOLN
1.0000 g | Freq: Once | INTRAVENOUS | Status: AC
  Administered 2021-05-05: 1 g via INTRAVENOUS
  Filled 2021-05-05: qty 10

## 2021-05-05 MED ORDER — ZIPRASIDONE MESYLATE 20 MG IM SOLR: 20.0000 mg | INTRAMUSCULAR | Status: DC | PRN

## 2021-05-05 MED ORDER — ZIPRASIDONE MESYLATE 20 MG IM SOLR
20.0000 mg | INTRAMUSCULAR | Status: DC | PRN
Start: 2021-05-05 — End: 2021-05-12

## 2021-05-05 MED ORDER — ACETAMINOPHEN 325 MG PO TABS
650.0000 mg | ORAL_TABLET | Freq: Four times a day (QID) | ORAL | Status: DC | PRN
Start: 2021-05-05 — End: 2021-05-12

## 2021-05-05 MED ORDER — MAGNESIUM HYDROXIDE 400 MG/5ML PO SUSP
30.0000 mL | Freq: Every day | ORAL | Status: DC | PRN
  Administered 2021-05-07 – 2021-05-10 (×4): 30 mL via ORAL
  Filled 2021-05-05 (×4): qty 30

## 2021-05-05 MED ORDER — ALUM & MAG HYDROXIDE-SIMETH 200-200-20 MG/5ML PO SUSP: 30.0000 mL | ORAL | Status: DC | PRN

## 2021-05-05 MED ORDER — LORAZEPAM 1 MG PO TABS
1.0000 mg | ORAL_TABLET | ORAL | Status: AC | PRN
  Administered 2021-05-07: 1 mg via ORAL
  Filled 2021-05-05: qty 1

## 2021-05-05 MED ORDER — AMOXICILLIN-POT CLAVULANATE 875-125 MG PO TABS
1.0000 | ORAL_TABLET | Freq: Two times a day (BID) | ORAL | Status: AC
  Administered 2021-05-05 – 2021-05-12 (×14): 1 via ORAL
  Filled 2021-05-05 (×15): qty 1

## 2021-05-05 MED ORDER — OLANZAPINE 10 MG PO TBDP
10.0000 mg | ORAL_TABLET | Freq: Every day | ORAL | Status: DC
  Administered 2021-05-05 – 2021-05-07 (×3): 10 mg via ORAL
  Filled 2021-05-05 (×6): qty 1

## 2021-05-05 MED ORDER — OLANZAPINE 5 MG PO TBDP
5.0000 mg | ORAL_TABLET | Freq: Three times a day (TID) | ORAL | Status: DC | PRN
  Administered 2021-05-06 – 2021-05-08 (×3): 5 mg via ORAL
  Filled 2021-05-05 (×3): qty 1

## 2021-05-05 MED ORDER — AMOXICILLIN-POT CLAVULANATE 875-125 MG PO TABS
1.0000 | ORAL_TABLET | Freq: Two times a day (BID) | ORAL | Status: DC
Start: 2021-05-06 — End: 2021-05-05

## 2021-05-05 MED ORDER — ESCITALOPRAM OXALATE 20 MG PO TABS
20.0000 mg | ORAL_TABLET | Freq: Every day | ORAL | Status: DC
  Administered 2021-05-06 – 2021-05-12 (×7): 20 mg via ORAL
  Filled 2021-05-05: qty 1
  Filled 2021-05-05: qty 7
  Filled 2021-05-05 (×8): qty 1

## 2021-05-05 MED ORDER — CEFTRIAXONE SODIUM 1 G IJ SOLR: 1.0000 g | Freq: Once | INTRAMUSCULAR | Status: DC

## 2021-05-05 MED ORDER — HYDROXYZINE HCL 25 MG PO TABS
25.0000 mg | ORAL_TABLET | Freq: Three times a day (TID) | ORAL | Status: DC | PRN
Start: 2021-05-05 — End: 2021-05-12
  Administered 2021-05-06 – 2021-05-10 (×5): 25 mg via ORAL
  Filled 2021-05-05 (×4): qty 1
  Filled 2021-05-05: qty 10
  Filled 2021-05-05 (×2): qty 1

## 2021-05-05 MED ORDER — LACTATED RINGERS IV BOLUS
1000.0000 mL | Freq: Once | INTRAVENOUS | Status: AC
  Administered 2021-05-05: 1000 mL via INTRAVENOUS

## 2021-05-05 MED ORDER — OLANZAPINE 5 MG PO TBDP
5.0000 mg | ORAL_TABLET | Freq: Three times a day (TID) | ORAL | Status: DC | PRN
  Administered 2021-05-05: 5 mg via ORAL
  Filled 2021-05-05: qty 1

## 2021-05-05 MED ORDER — LORAZEPAM 1 MG PO TABS
1.0000 mg | ORAL_TABLET | ORAL | Status: AC | PRN
Start: 2021-05-05 — End: 2021-05-05
  Administered 2021-05-05: 1 mg via ORAL
  Filled 2021-05-05: qty 1
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
## 2021-05-05 NOTE — BH Assessment (Signed)
[facility] Assessment Progress Note   Per Dorena Bodo, NP, this pt requires psychiatric hospitalization at this time.  Danika, RN, [facility] has assigned pt to [facility] Rm 306-1 to the service of Dr Fayrene Fearing.  [facility] will be ready to receive pt at 16:30.  With the assistance of American Sign Language interpreter Lauris Poag, this writer explained current unit restrictions at [facility] due to Covid-19, then I reviewed consent forms with him.  Pt has signed Voluntary Admission and Consent for Treatment, as well as Consent to Release Information to Karel Jarvis, PA, and a notification has been sent to [place] via secure chat.  Signed forms have been faxed to [facility].  [name] Jacalyn Lefevre, MD and pt's nurse, Addison Naegeli, have been notified, and Addison Naegeli agrees to send original paperwork along with pt via Safe Transport, and to call report to [phone].  Doylene Canning, [place] Behavioral Health Coordinator [phone]

## 2021-05-05 NOTE — Progress Notes (Signed)
05/05/2021  1724  Called report to [facility] [phone]. Report given to Casimiro Needle.

## 2021-05-05 NOTE — ED Notes (Signed)
Patient was feeling anxious. Gave Zyprexa

## 2021-05-05 NOTE — ED Triage Notes (Signed)
Patient reports he has been having visual and auditory hallucinations for the past 2-3 days. Pt reports he has a hx of anxiety and his medications were changed recently. Pt denies HI, however endorses SI. Pt reports he had a plan to stab himself with a knife but instead drove himself to the hospital.

## 2021-05-05 NOTE — Progress Notes (Signed)
DAR Note: Pt assessment completed using interpreter services (sign language). Pt irritable, appears sad, affect is blunted and mood is depressed, pt however reports that he feels slightly better than he did prior to coming to the hospital, and denies SI, and does not answer when asked about auditory/visual hallucinations. Pt refused to take Trazodone for insomnia, states that he takes Seroquel for insomnia. Message has been sent to provider regarding this. Pt medicated with Atarax 25mg  for anxiety. Q15 minute checks being maintained for safety.   05/05/21 2323  Psych Admission Type (Psych Patients Only)  Admission Status Voluntary  Psychosocial Assessment  Patient Complaints Anxiety;Depression  Eye Contact Fair  Facial Expression Anxious  Affect Anxious;Depressed  Speech Other (Comment) (deaf)  Interaction Assertive  Motor Activity Restless  Appearance/Hygiene In scrubs  Behavior Characteristics Cooperative;Appropriate to situation  Mood Depressed;Anxious  Aggressive Behavior  Targets  (no aggressive behaviors exhibited so far)  Thought Process  Coherency Concrete thinking  Content UTA  Delusions None reported or observed  Perception UTA  Hallucination UTA  Judgment UTA  Danger to Self  Current suicidal ideation? Denies  Self-Injurious Behavior No self-injurious ideation or behavior indicators observed or expressed   Agreement Not to Harm Self Yes  Description of Agreement pt verbally agrees to approach staff before harming self at [facility]  Danger to Others  Danger to Others None reported or observed

## 2021-05-05 NOTE — Progress Notes (Signed)
Patient ID: Tyler Horn, male   DOB: 01-Feb-1961, 60 y.o.   MRN: [id number] Admission Note  Pt is a 60 yo male that presents voluntarily on 9/6/ 2022 with worsening anxiety, depression, avh, and suicidal ideations stating they need a medication adjustment. Pt states they have started to hear things and see people in door knobs looking at them. Pt denies alcohol, tobacco or drug use. Pt states they will return home when they are discharged. Pt endorses support outside of hospital. Pt states they had a plan to overdose on sleeping pills. Pt contracts for safety while at [facility]. Pt denies current si/hi/ah/vh and verbally agrees to approach staff before harming self/others while at [facility]. Consents signed, handbook detailing the patient's rights, responsibilities, and visitor guidelines provided. Skin/belongings search completed and patient oriented to unit. Patient stable at this time. Patient given the opportunity to express concerns and ask questions. Patient given toiletries. Will continue to monitor.   [facility] Exam 05/03/2021:  Patient seen and evaluated face-to-face by this provider and chart reviewed. Patient was assessed using a translator provided by American sign language. On evaluation, the patient is alert and oriented x4. His mood is anxious and affect is congruent. He is noted to be breathing fast, fidgety and grabbing his chest. He reports via translator, that he is having a lot of anxiety and has been very depressed for the past two days. He describes his symptoms as "feeling like a truck ran over me and feeling tight." He states that his medications need to be adjusted. He states that he is currently off of his medications. When asked why is he off of his medications, he states, "I do not know."  He then states that he ran out of his medications. He was informed that he was seen by outpatient psychiatry here at the [facility] on 04/09/21 and was prescribed Lexapro and  Zyprexa for his anxiety and depression with a refill. When asked if he has followed up with the pharmacy about his refills, he states that he was told that he needed approval. When asked which medication did he need an approval for, he states, "Seroquel."  He was advised that he is no longer prescribed Seroquel and that he prescribed Zyprexa in place of Seroquel He verbalizes understanding via Nurse, learning disability. We discussed the patient taking a 1 time dose of Vistaril 25 mg po once here at the urgent care for his current state/panic attack.  He was advised that he would be provided with a prescription for Vistaril 25 mg po every 6 hours as needed for anxiety x4 days. He was advised that he will need to follow up with his outpatient provider for medication adjustments. He was provided with the days and times for open access at the [facility] outpatient.  He verbalizes understanding via Nurse, learning disability.

## 2021-05-05 NOTE — BH Assessment (Signed)
@  05/05/2021, requested patient's nurses's Maralyn Sago, RN and Anastasio Champion) to play the TTS machine in patient's room.

## 2021-05-05 NOTE — ED Provider Notes (Signed)
[facility]-EMERGENCY DEPT Provider Note   CSN: [id number] Arrival date & time: 05/05/21  0751     History Chief Complaint  Patient presents with   Psychiatric Evaluation   Suicidal    Tyler Horn is a 60 y.o. male.  The history is provided by the patient. The history is limited by a language barrier. A language interpreter was used.  Patient presents for hallucinations and SI with plan.  He reports recent changes in his psychiatric medications.  His current symptoms have been ongoing for the past 3 days.  Per chart review, patient was seen at [facility] 2 days ago.  At that time, he reported feeling anxious.  It was noted that he was off of his home medications at that time.  He was prescribed Vistaril for anxiety.  Patient confirms that Vistaril is the only medication that he has been taking of late.  Patient states that it has not helped his symptoms.  Patient denies any recent episodes of self-harm.  He denies any specific physical complaints but does state that he feels overall unwell.    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/16/2020   Vision changes 07/09/2020   Weight loss 07/09/2020   Idiopathic peripheral neuropathy 04/27/2020   Herpes simplex 04/27/2020   Herpes zoster 04/27/2020   Healthcare maintenance 12/19/2019   Early syphilis, 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   Post-traumatic stress disorder, unspecified 11/28/2019   Sedative, 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   Major depressive disorder, recurrent, severe  without psychotic behavior (HCC) 07/13/2017   Benzodiazepine misuse 07/31/2014   Human immunodeficiency virus infection (HCC)    Delusional disorder (HCC) 07/30/2014   Suicidal thoughts 07/30/2014   Visual hallucinations    Generalized anxiety disorder 03/26/2013    Past Surgical History:  Procedure Laterality Date   CATARACT EXTRACTION Left        Family History  Problem Relation Age of Onset   Mental illness Mother    Bipolar disorder Brother     Social History   Tobacco Use   Smoking status: Never   Smokeless tobacco: Never  Vaping Use   Vaping Use: Never used  Substance Use Topics   Alcohol use: No   Drug use: No    Home Medications Prior to Admission medications   Medication Sig Start Date End Date Taking? Authorizing Provider  acetaminophen (TYLENOL) 500 MG tablet Take 1,000 mg by mouth every 6 (six) hours as needed for mild pain.   Yes [provider]  bictegravir-emtricitabine-tenofovir AF (BIKTARVY) 50-200-25 MG TABS tablet Take 1 tablet by mouth daily. 04/14/21  Yes Veryl Speak, FNP  cholecalciferol (VITAMIN D3) 25 MCG (1000 UNIT) tablet Take 1,000 Units by mouth daily.   Yes [provider]  escitalopram (LEXAPRO) 20 MG tablet Take 2 tablets (40 mg total) by mouth daily. 04/09/21 04/09/22 Yes Nwoko, Uchenna E, PA  hydrOXYzine (ATARAX/VISTARIL) 25 MG tablet Take 1 tablet (25 mg total) by mouth every 6 (six) hours as needed for anxiety. 05/03/21  Yes Layla Barter, NP  OLANZapine (ZYPREXA) 10 MG tablet Take 1 tablet (10 mg total) by mouth at bedtime. 04/09/21 04/09/22 Yes Nwoko, Tommas OlpUchenna E, PA  Omega-3 Fatty Acids (FISH OIL) 1000 MG CPDR Take 1,000 mg by mouth daily.   Yes [provider]  QUEtiapine (SEROQUEL) 200 MG tablet Take 200 mg by mouth at bedtime.   Yes [provider]  tamsulosin (FLOMAX) 0.4 MG CAPS capsule Take 0.4 mg by mouth daily.   Yes [provider]    Allergies    Patient has no known  allergies.  Review of Systems   Review of Systems  Constitutional:  Positive for appetite change and fatigue. Negative for chills, diaphoresis and fever.  HENT:  Negative for congestion, ear pain, rhinorrhea and sore throat.   Eyes:  Negative for pain and visual disturbance.  Respiratory:  Negative for cough, chest tightness, shortness of breath and wheezing.   Cardiovascular:  Negative for chest pain, palpitations and leg swelling.  Gastrointestinal:  Negative for abdominal pain, constipation, diarrhea, nausea and vomiting.  Genitourinary:  Negative for dysuria, flank pain and hematuria.  Musculoskeletal:  Negative for arthralgias, back pain, joint swelling, myalgias and neck pain.  Skin:  Negative for color change and rash.  Neurological:  Positive for light-headedness. Negative for dizziness, seizures, syncope, facial asymmetry, weakness, numbness and headaches.  Psychiatric/Behavioral:  Positive for dysphoric mood, hallucinations and suicidal ideas. Negative for self-injury. The patient is nervous/anxious.   All other systems reviewed and are negative.  Physical Exam Updated Vital Signs BP (!) 121/44 (BP Location: Right Arm) Comment: took it twice  Pulse 80   Temp 98.3 F (36.8 C) (Oral)   Resp 17   Ht 6\' 4"  (1.93 m)   Wt 90.7 kg   SpO2 100%   BMI 24.34 kg/m   Physical Exam Vitals and nursing note reviewed.  Constitutional:      General: He is not in acute distress.    Appearance: Normal appearance. He is well-developed and normal weight. He is not ill-appearing, toxic-appearing or diaphoretic.  HENT:     Head: Normocephalic and atraumatic.     Right Ear: External ear normal.     Left Ear: External ear normal.     Nose: Nose normal.     Mouth/Throat:     Mouth: Mucous membranes are moist.     Pharynx: Oropharynx is clear.  Eyes:     Extraocular Movements: Extraocular movements intact.     Conjunctiva/sclera: Conjunctivae normal.  Cardiovascular:     Rate and Rhythm:  Normal rate and regular rhythm.     Heart sounds: No murmur heard. Pulmonary:     Effort: Pulmonary effort is normal. No respiratory distress.     Breath sounds: Normal breath sounds. No wheezing.  Abdominal:     Palpations: Abdomen is soft.     Tenderness: There is no abdominal tenderness.  Musculoskeletal:        General: Normal range of motion.     Cervical back: Neck supple.  Skin:    General: Skin is warm and dry.     Coloration: Skin is not jaundiced or pale.  Neurological:     General: No focal deficit present.     Mental Status: He is alert and oriented to person, place, and time.     Cranial Nerves: No cranial nerve deficit.     Sensory: No sensory deficit.     Motor: No weakness.     Coordination: Coordination normal.  Psychiatric:  Attention and Perception: He perceives auditory and visual hallucinations.        Mood and Affect: Mood is anxious and depressed.        Behavior: Behavior normal. Behavior is not agitated, aggressive or combative. Behavior is cooperative.        Thought Content: Thought content includes suicidal ideation. Thought content includes suicidal plan.     Comments: Patient communicates through American sign language    ED Results / Procedures / Treatments   Labs (all labs ordered are listed, but only abnormal results are displayed) Labs Reviewed  COMPREHENSIVE METABOLIC PANEL - Abnormal; Notable for the following components:      Result Value   Potassium 3.4 (*)    BUN 24 (*)    Creatinine, Ser 1.36 (*)    Total Protein 8.7 (*)    AST 115 (*)    GFR, Estimated 60 (*)    All other components within normal limits  RAPID URINE DRUG SCREEN, HOSP PERFORMED - Abnormal; Notable for the following components:   Benzodiazepines POSITIVE (*)    All other components within normal limits  CBC WITH DIFFERENTIAL/PLATELET - Abnormal; Notable for the following components:   WBC 12.8 (*)    Hemoglobin 17.6 (*)    Platelets 482 (*)    Neutro Abs  9.3 (*)    Abs Immature Granulocytes 0.09 (*)    All other components within normal limits  URINALYSIS, ROUTINE W REFLEX MICROSCOPIC - Abnormal; Notable for the following components:   APPearance TURBID (*)    Specific Gravity, Urine >1.030 (*)    Bilirubin Urine SMALL (*)    Protein, ur >300 (*)    Nitrite POSITIVE (*)    Leukocytes,Ua SMALL (*)    All other components within normal limits  URINALYSIS, MICROSCOPIC (REFLEX) - Abnormal; Notable for the following components:   Bacteria, UA MANY (*)    All other components within normal limits  RESP PANEL BY RT-PCR (FLU A&B, COVID) ARPGX2  URINE CULTURE  ETHANOL  MAGNESIUM  TROPONIN I (HIGH SENSITIVITY)    EKG None  Radiology DG Chest Port 1 View  Result Date: 05/05/2021 CLINICAL DATA:  Lightheadedness. EXAM: PORTABLE CHEST 1 VIEW COMPARISON:  Chest x-ray dated October 26, 2019. FINDINGS: The heart size and mediastinal contours are within normal limits. Normal pulmonary vascularity. Unchanged mild scarring in the right middle lobe. No focal consolidation, pleural effusion, or pneumothorax. Mild elevation of the right hemidiaphragm. No acute osseous abnormality. IMPRESSION: 1. No active disease. Electronically Signed   By: Obie Dredge M.D.   On: 05/05/2021 09:15    Procedures Procedures   Medications Ordered in ED Medications  cefTRIAXone (ROCEPHIN) 1 g in sodium chloride 0.9 % 100 mL IVPB (0 g Intravenous Stopped 05/05/21 1128)  lactated ringers bolus 1,000 mL (0 mLs Intravenous Stopped 05/05/21 1500)  LORazepam (ATIVAN) tablet 1 mg (1 mg Oral Given 05/05/21 1101)    ED Course  I have reviewed the triage vital signs and the nursing notes.  Pertinent labs & imaging results that were available during my care of the patient were reviewed by me and considered in my medical decision making (see chart for details).    MDM Rules/Calculators/A&P                           Patient is a 60 year old male who presents for auditory  hallucinations, visual hallucinations, and suicidal ideation with plan.  Patient endorses fatigue and  decreased appetite, which are consistent with dysphoric mood.  He has felt intermittently lightheaded.  He denies any areas of discomfort.  Physical exam is unremarkable.  EKG shows PVCs (consistent with prior EKGs), in addition to some nonspecific T wave abnormalities.  Patient underwent laboratory work-up for medical clearance.  Work-up notable for nitrite positive UTI with a leukocytosis 12.8.  IV ceftriaxone was given.  Patient was prescribed continued antibiotic therapy starting tomorrow.  Bolus of IVF was given.  Work-up was otherwise unremarkable.  Troponin was normal.  Following medical clearance, TTS was consulted.  As needed medications ordered for agitation.  Patient remained in the ED awaiting psychiatric evaluation.  Final Clinical Impression(s) / ED Diagnoses Final diagnoses:  Lightheadedness  Suicidal ideation    Rx / DC Orders ED Discharge Orders     None        Gloris Manchester, MD 05/05/21 1836

## 2021-05-05 NOTE — Progress Notes (Signed)
Psychoeducational Group Note  Date:  05/05/2021 Time:  2132  Group Topic/Focus:  Wrap-Up Group:   The focus of this group is to help patients review their daily goal of treatment and discuss progress on daily workbooks.  Participation Level: Did Not Attend  Participation Quality:  Not Applicable  Affect:  Not Applicable  Cognitive:  Not Applicable  Insight:  Not Applicable  Engagement in Group: Not Applicable  Additional Comments:  The patient did not attend group this evening.   Hazle Coca S 05/05/2021, 9:32 PM

## 2021-05-05 NOTE — Tx Team (Signed)
Initial Treatment Plan 05/05/2021 6:28 PM [name] [id number]    PATIENT STRESSORS: Health problems   Medication change or noncompliance     PATIENT STRENGTHS: Ability for insight  Capable of independent living  Communication skills  Motivation for treatment/growth  Physical Health  Supportive family/friends    PATIENT IDENTIFIED PROBLEMS: avh  Suicidal ideations  anxiety  Medication adjustment               DISCHARGE CRITERIA:  Ability to meet basic life and health needs Improved stabilization in mood, thinking, and/or behavior Motivation to continue treatment in a less acute level of care Need for constant or close observation no longer present  PRELIMINARY DISCHARGE PLAN: Attend aftercare/continuing care group Return to previous living arrangement  PATIENT/FAMILY INVOLVEMENT: This treatment plan has been presented to and reviewed with the patient, Tyler Horn.  The patient and family have been given the opportunity to ask questions and make suggestions.  Raylene Miyamoto, RN 05/05/2021, 6:28 PM

## 2021-05-05 NOTE — ED Notes (Signed)
LUNCH TRAY GIVEN. 

## 2021-05-05 NOTE — Progress Notes (Signed)
05/05/2021  Called safe transport to transport patient to [facility].

## 2021-05-06 ENCOUNTER — Encounter (HOSPITAL_COMMUNITY): Payer: Self-pay

## 2021-05-06 DIAGNOSIS — F332 Major depressive disorder, recurrent severe without psychotic features: Secondary | ICD-10-CM

## 2021-05-06 LAB — URINE CULTURE

## 2021-05-06 LAB — LIPID PANEL
Cholesterol: 138 mg/dL (ref 0–200)
HDL: 33 mg/dL — ABNORMAL LOW (ref >40)
LDL Cholesterol: 90 mg/dL (ref 0–99)
Total CHOL/HDL Ratio: 4.2 RATIO
Triglycerides: 77 mg/dL (ref <150)
VLDL: 15 mg/dL (ref 0–40)

## 2021-05-06 LAB — TSH: TSH: 1.199 u[IU]/mL (ref 0.350–4.500)

## 2021-05-06 LAB — HEMOGLOBIN A1C
Hgb A1c MFr Bld: 5 % (ref 4.8–5.6)
Mean Plasma Glucose: 96.8 mg/dL

## 2021-05-06 MED ORDER — OMEGA-3-ACID ETHYL ESTERS 1 G PO CAPS
1.0000 g | ORAL_CAPSULE | Freq: Every day | ORAL | Status: DC
  Administered 2021-05-06 – 2021-05-12 (×7): 1 g via ORAL
  Filled 2021-05-06 (×10): qty 1

## 2021-05-06 MED ORDER — VITAMIN D3 25 MCG PO TABS
1000.0000 [IU] | ORAL_TABLET | Freq: Every day | ORAL | Status: DC
  Administered 2021-05-06 – 2021-05-12 (×7): 1000 [IU] via ORAL
  Filled 2021-05-06 (×9): qty 1

## 2021-05-06 MED ORDER — BICTEGRAVIR-EMTRICITAB-TENOFOV 50-200-25 MG PO TABS
1.0000 | ORAL_TABLET | Freq: Every day | ORAL | Status: DC
  Administered 2021-05-06 – 2021-05-12 (×7): 1 via ORAL
  Filled 2021-05-06 (×10): qty 1

## 2021-05-06 MED ORDER — TAMSULOSIN HCL 0.4 MG PO CAPS
0.4000 mg | ORAL_CAPSULE | Freq: Every day | ORAL | Status: DC
  Administered 2021-05-06 – 2021-05-12 (×7): 0.4 mg via ORAL
  Filled 2021-05-06: qty 7
  Filled 2021-05-06 (×8): qty 1

## 2021-05-06 NOTE — [facility] Group Notes (Signed)
Patient did not attend the Psycho-Ed group. 

## 2021-05-06 NOTE — Group Note (Addendum)
Recreation Therapy Group Note   Group Topic:Stress Management  Group Date: 05/06/2021 Start Time: 0930 End Time: 0950 Facilitators: Caroll Rancher, LRT/CTRS Location: [address]   Activity: Guided Imagery. LRT provided education, instruction, and demonstration on practice of visualization via guided imagery. Patient was asked to participate in the technique introduced during session. LRT debriefed including topics of mindfulness, stress management and specific scenarios each patient could use these techniques. Patients were given suggestions of ways to access scripts post d/c and encouraged to explore Youtube and other apps available on smartphones, tablets, and computers.    Affect/Mood: N/A   Participation Level: Did not attend    Clinical Observations/Individualized Feedback: Pt did not attend group session.   Plan: Continue to engage patient in RT group sessions 2-3x/week.   Caroll Rancher, LRT/CTRS 05/08/2021 12:21 PM

## 2021-05-06 NOTE — Progress Notes (Signed)
Patient ID: Tyler Horn, male   DOB: [birth year], 60 y.o.   MRN: [id number] Patient with low BP at 0627 (84/62), was asymptomatic, fluids encouraged, pt given Gatorade and BP rechecked and WNL.

## 2021-05-06 NOTE — [facility] Group Notes (Signed)
Patient did not attend the relaxation group. 
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
## 2021-05-06 NOTE — Plan of Care (Signed)
Nurse discussed anxiety, depression and coping skills with patient.  

## 2021-05-06 NOTE — Progress Notes (Signed)
Psychoeducational Group Note  Date:  05/06/2021 Time:  2130  Group Topic/Focus:  Wrap-Up Group:   The focus of this group is to help patients review their daily goal of treatment and discuss progress on daily workbooks.  Participation Level: Did Not Attend  Participation Quality:  Not Applicable  Affect:  Not Applicable  Cognitive:  Not Applicable  Insight:  Not Applicable  Engagement in Group: Not Applicable  Additional Comments:  The patient did not attend group this evening.   Dejan Angert S 05/06/2021, 9:30 PM
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
## 2021-05-06 NOTE — BH IP Treatment Plan (Signed)
Interdisciplinary Treatment and Diagnostic Plan Update  05/06/2021 Time of Session: 9:15am  Tyler Horn MRN: [id number]  Principal Diagnosis: MDD (major depressive disorder), recurrent severe, without psychosis ([place])  Secondary Diagnoses: Principal Problem:   MDD (major depressive disorder), recurrent severe, without psychosis ([place]) Active Problems:   Suicidal thoughts   Benzodiazepine misuse   Generalized anxiety disorder   Current Medications:  Current Facility-Administered Medications  Medication Dose Route Frequency Provider Last Rate Last Admin   acetaminophen (TYLENOL) tablet 650 mg  650 mg Oral Q6H PRN Chalmers Guest, NP       alum & mag hydroxide-simeth (MAALOX/MYLANTA) 200-200-20 MG/5ML suspension 30 mL  30 mL Oral Q4H PRN Chalmers Guest, NP       amoxicillin-clavulanate (AUGMENTIN) 875-125 MG per tablet 1 tablet  1 tablet Oral Q12H Chalmers Guest, NP   1 tablet at 05/06/21 0900   bictegravir-emtricitabine-tenofovir AF (BIKTARVY) 50-200-25 MG per tablet 1 tablet  1 tablet Oral Daily Margorie John W, PA-C   1 tablet at 05/06/21 0900   escitalopram (LEXAPRO) tablet 20 mg  20 mg Oral Daily Chalmers Guest, NP   20 mg at 05/06/21 0900   hydrOXYzine (ATARAX/VISTARIL) tablet 25 mg  25 mg Oral TID PRN Chalmers Guest, NP   25 mg at 05/06/21 0920   OLANZapine zydis (ZYPREXA) disintegrating tablet 5 mg  5 mg Oral Q8H PRN Chalmers Guest, NP   5 mg at 05/06/21 1208   And   LORazepam (ATIVAN) tablet 1 mg  1 mg Oral PRN Chalmers Guest, NP       And   ziprasidone (GEODON) injection 20 mg  20 mg Intramuscular PRN Chalmers Guest, NP       magnesium hydroxide (MILK OF MAGNESIA) suspension 30 mL  30 mL Oral Daily PRN Chalmers Guest, NP       OLANZapine zydis (ZYPREXA) disintegrating tablet 10 mg  10 mg Oral QHS Chalmers Guest, NP   10 mg at 05/05/21 2111   omega-3 acid ethyl esters (LOVAZA) capsule 1 g  1 g Oral Daily Margorie John W, PA-C   1 g at 05/06/21 0900   tamsulosin (FLOMAX) capsule 0.4  mg  0.4 mg Oral Daily Margorie John W, PA-C   0.4 mg at 05/06/21 0900   traZODone (DESYREL) tablet 50 mg  50 mg Oral QHS PRN Chalmers Guest, NP       Vitamin D3 (Vitamin D) tablet 1,000 Units  1,000 Units Oral Daily Margorie John W, PA-C   1,000 Units at 05/06/21 0900   PTA Medications: Medications Prior to Admission  Medication Sig Dispense Refill Last Dose   acetaminophen (TYLENOL) 500 MG tablet Take 1,000 mg by mouth every 6 (six) hours as needed for mild pain.      bictegravir-emtricitabine-tenofovir AF (BIKTARVY) 50-200-25 MG TABS tablet Take 1 tablet by mouth daily. 30 tablet 5    cholecalciferol (VITAMIN D3) 25 MCG (1000 UNIT) tablet Take 1,000 Units by mouth daily.      escitalopram (LEXAPRO) 20 MG tablet Take 2 tablets (40 mg total) by mouth daily. 60 tablet 1    hydrOXYzine (ATARAX/VISTARIL) 25 MG tablet Take 1 tablet (25 mg total) by mouth every 6 (six) hours as needed for anxiety. 16 tablet 0    OLANZapine (ZYPREXA) 10 MG tablet Take 1 tablet (10 mg total) by mouth at bedtime. 30 tablet 1    Omega-3 Fatty Acids (FISH OIL) 1000 MG CPDR Take 1,000 mg  by mouth daily.      QUEtiapine (SEROQUEL) 200 MG tablet Take 200 mg by mouth at bedtime.      tamsulosin (FLOMAX) 0.4 MG CAPS capsule Take 0.4 mg by mouth daily.       Patient Stressors: Health problems   Medication change or noncompliance    Patient Strengths: Ability for insight  Capable of independent living  Communication skills  Motivation for treatment/growth  Physical Health  Supportive family/friends   Treatment Modalities: Medication Management, Group therapy, Case management,  1 to 1 session with clinician, Psychoeducation, Recreational therapy.   Physician Treatment Plan for Primary Diagnosis: MDD (major depressive disorder), recurrent severe, without psychosis ([place]) Long Term Goal(s): Improvement in symptoms so as ready for discharge   Short Term Goals: Ability to demonstrate self-control will improve Ability to  identify and develop effective coping behaviors will improve Ability to maintain clinical measurements within normal limits will improve Compliance with prescribed medications will improve Ability to identify changes in lifestyle to reduce recurrence of condition will improve Ability to verbalize feelings will improve Ability to disclose and discuss suicidal ideas  Medication Management: Evaluate patient's response, side effects, and tolerance of medication regimen.  Therapeutic Interventions: 1 to 1 sessions, Unit Group sessions and Medication administration.  Evaluation of Outcomes: Not Met  Physician Treatment Plan for Secondary Diagnosis: Principal Problem:   MDD (major depressive disorder), recurrent severe, without psychosis ([place]) Active Problems:   Suicidal thoughts   Benzodiazepine misuse   Generalized anxiety disorder  Long Term Goal(s): Improvement in symptoms so as ready for discharge   Short Term Goals: Ability to demonstrate self-control will improve Ability to identify and develop effective coping behaviors will improve Ability to maintain clinical measurements within normal limits will improve Compliance with prescribed medications will improve Ability to identify changes in lifestyle to reduce recurrence of condition will improve Ability to verbalize feelings will improve Ability to disclose and discuss suicidal ideas     Medication Management: Evaluate patient's response, side effects, and tolerance of medication regimen.  Therapeutic Interventions: 1 to 1 sessions, Unit Group sessions and Medication administration.  Evaluation of Outcomes: Not Met   RN Treatment Plan for Primary Diagnosis: MDD (major depressive disorder), recurrent severe, without psychosis ([place]) Long Term Goal(s): Knowledge of disease and therapeutic regimen to maintain health will improve  Short Term Goals: Ability to remain free from injury will improve, Ability to participate in decision  making will improve, Ability to verbalize feelings will improve, Ability to disclose and discuss suicidal ideas, and Ability to identify and develop effective coping behaviors will improve  Medication Management: RN will administer medications as ordered by provider, will assess and evaluate patient's response and provide education to patient for prescribed medication. RN will report any adverse and/or side effects to prescribing provider.  Therapeutic Interventions: 1 on 1 counseling sessions, Psychoeducation, Medication administration, Evaluate responses to treatment, Monitor vital signs and CBGs as ordered, Perform/monitor CIWA, COWS, AIMS and Fall Risk screenings as ordered, Perform wound care treatments as ordered.  Evaluation of Outcomes: Not Met   LCSW Treatment Plan for Primary Diagnosis: MDD (major depressive disorder), recurrent severe, without psychosis ([place]) Long Term Goal(s): Safe transition to appropriate next level of care at discharge, Engage patient in therapeutic group addressing interpersonal concerns.  Short Term Goals: Engage patient in aftercare planning with referrals and resources, Increase social support, Increase emotional regulation, Facilitate acceptance of mental health diagnosis and concerns, Identify triggers associated with mental health/substance abuse issues, and Increase skills  for wellness and recovery  Therapeutic Interventions: Assess for all discharge needs, 1 to 1 time with Social worker, Explore available resources and support systems, Assess for adequacy in community support network, Educate family and significant other(s) on suicide prevention, Complete Psychosocial Assessment, Interpersonal group therapy.  Evaluation of Outcomes: Not Met   Progress in Treatment: Attending groups: No. Participating in groups: No. Taking medication as prescribed: Yes. Toleration medication: Yes. Family/Significant other contact made: No, will contact:  [place]  Patient understands diagnosis: Yes. and No. Discussing patient identified problems/goals with staff: Yes. Medical problems stabilized or resolved: Yes. Denies suicidal/homicidal ideation: Yes. Issues/concerns per patient self-inventory: No.   New problem(s) identified: No, Describe:  None  New Short Term/Long Term Goal(s): medication stabilization, elimination of SI thoughts, development of comprehensive mental wellness plan.   Patient Goals: "To get better"   Discharge Plan or Barriers: Patient recently admitted. CSW will continue to follow and assess for appropriate referrals and possible discharge planning.   Reason for Continuation of Hospitalization: Anxiety Depression Hallucinations Medication stabilization Suicidal ideation  Estimated Length of Stay: 3 to 5 days    Scribe for Treatment Team: Darleen Crocker, Latanya Presser 05/06/2021 2:15 PM

## 2021-05-06 NOTE — Progress Notes (Signed)
Pt didn't attend goal group

## 2021-05-06 NOTE — [facility] Suicide Risk Assessment (Signed)
[facility] INPATIENT:  Family/Significant Other Suicide Prevention Education  Suicide Prevention Education:  Patient Refusal for Family/Significant Other Suicide Prevention Education: The patient Tyler Horn has refused to provide written consent for family/significant other to be provided Family/Significant Other Suicide Prevention Education during admission and/or prior to discharge.  Physician notified.  Siaosi Alter E Derrius Furtick 05/06/2021, 2:34 PM

## 2021-05-06 NOTE — Group Note (Deleted)
Recreation Therapy Group Note   Group Topic:Stress Management  Group Date: 05/06/2021 Start Time: 0930 End Time: 0950 Facilitators: Bjorn Loser, NT Location: {RT [facility] Location:26275}   Location: [address] Dayroom       Affect/Mood: {RT [facility] Affect/Mood:26271}   Participation Level: {RT [facility] Participation [employer]   Participation Quality: {RT [facility] Participation Quality:26268}   Behavior: {RT [facility] Group Behavior:26269}   Speech/Thought Process: {RT [facility] Speech/Thought:26276}   Insight: {RT [facility] Insight:26272}   Judgement: {RT [facility] Judgement:26278}   Modes of Intervention: {RT [facility] Modes of Intervention:26277}   Patient Response to Interventions:  {RT [facility] Patient Response to Intervention:26274}   Education Outcome:  {RT [facility] Education Outcome:26279}   Clinical Observations/Individualized Feedback: *** was *** in their participation of session activities and group discussion. Pt identified ***   Plan: {RT [facility] Tx Plan:26280}   Bjorn Loser, NT,  05/06/2021 12:37 PM

## 2021-05-06 NOTE — [facility] Group Notes (Signed)
Type of Therapy and Topic:  Group Therapy:  Self-Esteem   Participation Level:  Did not attend   Description of Group: This group addressed positive self-esteem. Patients were given a worksheet with a blank shield. Patients were asked what a shield is and when it is used. Patients were asked to list, draw, or write protective factors in the their lives on their shields. Patients discussed the words, ideas and drawings that they put on their shield. Patients were encouraged to have a daily reflection of positive characteristics/ protective factors.  Therapeutic Goals Patient will verbalize two of their positive qualities Patient will demonstrate insight but naming social supports in their lives Patient will verbalize their feelings when voicing positive self affirmations and when voicing positive affirmations of others Patients will discuss the potential positive impact on their wellness/recovery of focusing on positive traits of self and others.  Summary of Patient Progress:    Did not attend  

## 2021-05-06 NOTE — Progress Notes (Signed)
D:  Patient's self inventory sheet, patient has fair sleep, sleep medication helpful.  Fair appetite, low energy level, poor concentration.  Rated depression, hopeless, anxiety #10.  Denied withdrawals.  Denied SI, then checked sometimes, contracts for safety.   Physical problems, lightheaded, dizziness, blurred vision.  Denied pain.  Goal to stabilize depression and anxiety.  Plans to take medication. A:  Medications administered per MD orders.  Emotional support and encouragement given patient. R:  Denied SI, contracts for safety.  Denied HI.  Does see shadows occasionally.  Safety maintained with 15 minute checks.  Patient is deaf.

## 2021-05-06 NOTE — Progress Notes (Signed)
Patient denied HI.  Patient does have "little" thoughts of suicide, contracts for safety, will let staff know.  Does see shadows.  Patient came to med window this morning.  Tremors.

## 2021-05-07 ENCOUNTER — Ambulatory Visit: Payer: Self-pay

## 2021-05-07 ENCOUNTER — Telehealth (INDEPENDENT_AMBULATORY_CARE_PROVIDER_SITE_OTHER): Payer: Self-pay | Admitting: Family

## 2021-05-07 DIAGNOSIS — F332 Major depressive disorder, recurrent severe without psychotic features: Secondary | ICD-10-CM | POA: Diagnosis not present

## 2021-05-07 MED ORDER — GABAPENTIN 100 MG PO CAPS
100.0000 mg | ORAL_CAPSULE | Freq: Three times a day (TID) | ORAL | Status: DC
  Administered 2021-05-07 – 2021-05-12 (×15): 100 mg via ORAL
  Filled 2021-05-07: qty 21
  Filled 2021-05-07 (×2): qty 1
  Filled 2021-05-07: qty 21
  Filled 2021-05-07 (×3): qty 1
  Filled 2021-05-07: qty 21
  Filled 2021-05-07 (×14): qty 1

## 2021-05-07 NOTE — Progress Notes (Signed)
[facility] MD Progress Note  05/07/2021 5:10 PM Tiburcio Linder  MRN:  [id number]  Subjective:  This assessment was conducted using a sign language interpreter: Cully reports, "I'm okay, doing so-so. My problem today is mostly anxiety. I did ask the nurse to give me something for anxiety, she gave me Vistaril, it did not help. I'm still having suicidal thoughts off & on, currently it is off. There no visual hallucinations today. I just need to get this anxiety under control". Rates depression #7 & anxiety #10".  Daily notes: Alois is seen, chart reviewed. The chart findings & treatment plan discussed with the treatment team. He is lying down in bed, easily aroused. He presents a bit expressing high levels of anxiety. He presents fidgety, rocking forth & back. He says he is doing so-so emotionally. Says what ever he is getting for anxiety seem to be not effective. He did deny any visual hallucinations today. He is sleeping well. His appetite is good. He reports fleeting suicidal ideations, says it comes & goes. He denies any auditory hallucinations. So far since his admission, all communications, assessments/follow-up care are being conducted using a sign language interpreter Angelica Chessman). However, starting tomorrow, there will be a sign language interpreter physically present here on the unit. This will allow Cote out of his room to attend & participate in the group milieu & other activities being provided & held on this unit. And to combat the high level of anxiety he is currently experiencing, we discussed initiation of gabapentin 100 mg tid. Patient is in agreement. The uses, possible adverse effects discussed with him. He denies any new issues or concerns. Reviewed vital signs, stable.  Objective: 60 year old Caucasian male with hx of Major depressive disorder. He has been a patient in this [facility] x numerous times with similar presentations. His last admission here at [facility] was in February of this year, 2022. He was here  for 7 days at that time. He is being admitted to the [facility] this time around from the [facility] with complaint of worsening symptoms of depression & suicidal ideations of 1 week with plans to cut himself with a kitchen knife. He is admitted for mood stabilization treatments  Principal Problem: MDD (major depressive disorder), recurrent severe, without psychosis (HCC)  Diagnosis: Principal Problem:   MDD (major depressive disorder), recurrent severe, without psychosis (HCC) Active Problems:   Suicidal thoughts   Benzodiazepine misuse   Generalized anxiety disorder  Total Time spent with patient:  35 minutes  Past Psychiatric History: See H&P  Past Medical History:  Past Medical History:  Diagnosis Date   Anxiety    Chronic hepatitis C (HCC) 04/27/2020   Depression    HIV (human immunodeficiency virus infection) (HCC)    HIV (human immunodeficiency virus infection) (HCC)     Past Surgical History:  Procedure Laterality Date   CATARACT EXTRACTION Left    Family History:  Family History  Problem Relation Age of Onset   Mental illness Mother    Bipolar disorder Brother    Family Psychiatric  History: See H&P  Social History:  Social History   Substance and Sexual Activity  Alcohol Use No     Social History   Substance and Sexual Activity  Drug Use No    Social History   Socioeconomic History   Marital status: Single    Spouse name: Not on file   Number of children: Not on file   Years of education: Not on file  Highest education level: Not on file  Occupational History   Not on file  Tobacco Use   Smoking status: Never   Smokeless tobacco: Never  Vaping Use   Vaping Use: Never used  Substance and Sexual Activity   Alcohol use: No   Drug use: No   Sexual activity: Yes    Comment: pt given condoms  Other Topics Concern   Not on file  Social History Narrative   Not on file   Social Determinants of Health   Financial Resource Strain: Not on  file  Food Insecurity: Not on file  Transportation Needs: Not on file  Physical Activity: Not on file  Stress: Not on file  Social Connections: Not on file   Additional Social History:   Sleep: Good, 6.75 oer documentation.  Appetite:  Good  Current Medications: Current Facility-Administered Medications  Medication Dose Route Frequency Provider Last Rate Last Admin   acetaminophen (TYLENOL) tablet 650 mg  650 mg Oral Q6H PRN Novella Olive, NP       alum & mag hydroxide-simeth (MAALOX/MYLANTA) 200-200-20 MG/5ML suspension 30 mL  30 mL Oral Q4H PRN Novella Olive, NP       amoxicillin-clavulanate (AUGMENTIN) 875-125 MG per tablet 1 tablet  1 tablet Oral Q12H Novella Olive, NP   1 tablet at 05/07/21 0857   bictegravir-emtricitabine-tenofovir AF (BIKTARVY) 50-200-25 MG per tablet 1 tablet  1 tablet Oral Daily Jaclyn Shaggy, PA-C   1 tablet at 05/07/21 0858   escitalopram (LEXAPRO) tablet 20 mg  20 mg Oral Daily Novella Olive, NP   20 mg at 05/07/21 0857   gabapentin (NEURONTIN) capsule 100 mg  100 mg Oral TID Armandina Stammer I, NP       hydrOXYzine (ATARAX/VISTARIL) tablet 25 mg  25 mg Oral TID PRN Novella Olive, NP   25 mg at 05/07/21 0857   magnesium hydroxide (MILK OF MAGNESIA) suspension 30 mL  30 mL Oral Daily PRN Novella Olive, NP   30 mL at 05/07/21 0857   OLANZapine zydis (ZYPREXA) disintegrating tablet 10 mg  10 mg Oral QHS Novella Olive, NP   10 mg at 05/06/21 2211   OLANZapine zydis (ZYPREXA) disintegrating tablet 5 mg  5 mg Oral Q8H PRN Novella Olive, NP   5 mg at 05/07/21 1035   And   ziprasidone (GEODON) injection 20 mg  20 mg Intramuscular PRN Novella Olive, NP       omega-3 acid ethyl esters (LOVAZA) capsule 1 g  1 g Oral Daily Melbourne Abts W, PA-C   1 g at 05/07/21 0857   tamsulosin (FLOMAX) capsule 0.4 mg  0.4 mg Oral Daily Melbourne Abts W, PA-C   0.4 mg at 05/07/21 0857   traZODone (DESYREL) tablet 50 mg  50 mg Oral QHS PRN Novella Olive, NP       Vitamin D3  (Vitamin D) tablet 1,000 Units  1,000 Units Oral Daily Jaclyn Shaggy, PA-C   1,000 Units at 05/07/21 1610    Lab Results:  Results for orders placed or performed during the hospital encounter of 05/05/21 (from the past 48 hour(s))  Hemoglobin A1c     Status: None   Collection Time: 05/06/21  6:54 AM  Result Value Ref Range   Hgb A1c MFr Bld 5.0 4.8 - 5.6 %    Comment: (NOTE) Pre diabetes:          5.7%-6.4%  Diabetes:              >  6.4%  Glycemic control for   <7.0% adults with diabetes    Mean Plasma Glucose 96.8 mg/dL    Comment: Performed at [facility], [address] [place], [place] [address]  Lipid panel     Status: Abnormal   Collection Time: 05/06/21  6:54 AM  Result Value Ref Range   Cholesterol 138 0 - 200 mg/dL   Triglycerides 77 <553 mg/dL   HDL 33 (L) >74 mg/dL   Total CHOL/HDL Ratio 4.2 RATIO   VLDL 15 0 - 40 mg/dL   LDL Cholesterol 90 0 - 99 mg/dL    Comment:        Total Cholesterol/HDL:CHD Risk Coronary Heart Disease Risk Table                     Men   Women  1/2 Average Risk   3.4   3.3  Average Risk       5.0   4.4  2 X Average Risk   9.6   7.1  3 X Average Risk  23.4   11.0        Use the calculated Patient Ratio above and the CHD Risk Table to determine the patient's CHD Risk.        ATP III CLASSIFICATION (LDL):  <100     mg/dL   Optimal  827-078  mg/dL   Near or Above                    Optimal  130-159  mg/dL   Borderline  675-449  mg/dL   High  >201     mg/dL   Very High Performed at [facility], [address], [place], [place] [address]   TSH     Status: None   Collection Time: 05/06/21  6:54 AM  Result Value Ref Range   TSH 1.199 0.350 - 4.500 uIU/mL    Comment: Performed by a 3rd Generation assay with a functional sensitivity of <=0.01 uIU/mL. Performed at [facility], [address], [place], [place] [address]     Blood Alcohol level:  Lab Results  Component Value Date   ETH  <10 05/05/2021   ETH <10 11/26/2019    Metabolic Disorder Labs: Lab Results  Component Value Date   HGBA1C 5.0 05/06/2021   MPG 96.8 05/06/2021   MPG 99.67 10/18/2020   No results found for: PROLACTIN Lab Results  Component Value Date   CHOL 138 05/06/2021   TRIG 77 05/06/2021   HDL 33 (L) 05/06/2021   CHOLHDL 4.2 05/06/2021   VLDL 15 05/06/2021   LDLCALC 90 05/06/2021   LDLCALC 93 10/18/2020    Physical Findings: AIMS: Facial and Oral Movements Muscles of Facial Expression: None, normal Lips and Perioral Area: Mild Jaw: Minimal Tongue: Minimal,Extremity Movements Upper (arms, wrists, hands, fingers): None, normal Lower (legs, knees, ankles, toes): None, normal, Trunk Movements Neck, shoulders, hips: None, normal, Overall Severity Severity of abnormal movements (highest score from questions above): Mild Incapacitation due to abnormal movements: Minimal Patient's awareness of abnormal movements (rate only patient's report): Aware, no distress, Dental Status Current problems with teeth and/or dentures?: No Does patient usually wear dentures?: No  CIWA:    COWS:     Musculoskeletal: Strength & Muscle Tone: within normal limits Gait & Station: normal Patient leans: N/A  Psychiatric Specialty Exam:  Presentation  General Appearance: Appropriate for Environment; Disheveled  Eye Contact:Fair  Speech:Other (comment) (deaf,  using writing to communicate when ASL interpreter not available)  Speech Volume:Other (comment) (none (sign language))  Handedness:Ambidextrous   Mood and Affect  Mood:Anxious; Depressed  Affect:Congruent  Thought Process  Thought Processes:Coherent  Descriptions of Associations:Intact  Orientation:Full (Time, Place and Person)  Thought Content:Perseveration; Rumination  History of Schizophrenia/Schizoaffective disorder:No  Duration of Psychotic Symptoms:Less than six months  Hallucinations:Hallucinations: None Description of  Visual Hallucinations: NA  Ideas of Reference:None  Suicidal Thoughts:Suicidal Thoughts: Yes, Passive SI Active Intent and/or Plan: Without Intent; Without Plan; Without Access to Means; Without Means to Carry Out SI Passive Intent and/or Plan: Without Intent; Without Plan; Without Means to Carry Out; Without Access to Means  Homicidal Thoughts:Homicidal Thoughts: No  Sensorium  Memory:Other (comment) (not assessed)  Judgment:Fair  Insight:Shallow   Executive Functions  Concentration:Fair  Attention Span:Fair  Recall:-- (not assessed)  Fund of Knowledge:Good  Language:-- (ASL)  Psychomotor Activity  Psychomotor Activity:Psychomotor Activity: Normal  Assets  Assets:Desire for Improvement; Physical Health; Resilience  Sleep  Sleep:Sleep: Good Number of Hours of Sleep: 6.75  Physical Exam: Physical Exam Vitals and nursing note reviewed.  HENT:     Head: Normocephalic.     Ears:     Comments: Deafness    Nose: Nose normal.  Cardiovascular:     Rate and Rhythm: Normal rate.     Pulses: Normal pulses.  Pulmonary:     Effort: Pulmonary effort is normal.  Genitourinary:    Comments: Hx. BPH Musculoskeletal:        General: Normal range of motion.     Cervical back: Normal range of motion.  Skin:    General: Skin is warm and dry.  Neurological:     General: No focal deficit present.     Mental Status: He is alert and oriented to person, place, and time.   Review of Systems  Constitutional:  Negative for chills, diaphoresis and fever.  HENT:  Positive for hearing loss. Negative for congestion and sore throat.   Eyes:  Negative for blurred vision.  Respiratory:  Negative for cough, shortness of breath and wheezing.   Cardiovascular:  Negative for chest pain and palpitations.  Gastrointestinal:  Negative for abdominal pain, blood in stool, constipation, diarrhea, heartburn, melena, nausea and vomiting.  Genitourinary:  Negative for dysuria.  Musculoskeletal:   Negative for joint pain and myalgias.  Skin: Negative.   Neurological:  Negative for dizziness, tingling, tremors, sensory change, speech change, focal weakness, seizures, loss of consciousness, weakness and headaches.  Endo/Heme/Allergies:  Negative for environmental allergies and polydipsia. Does not bruise/bleed easily.       Allergies: NKDA  Psychiatric/Behavioral:  Positive for depression. Negative for hallucinations (Hx. VH), memory loss, substance abuse and suicidal ideas. The patient is nervous/anxious. The patient does not have insomnia.   Blood pressure 96/64, pulse 93, temperature (!) 95 F (35 C), resp. rate 16, height 6\' 4"  (1.93 m), weight 81.6 kg, SpO2 99 %. Body mass index is 21.91 kg/m.  Treatment Plan Summary: Daily contact with patient to assess and evaluate symptoms and progress in treatment and Medication management  Continue inpatient hospitalization.  Will continue today 05/07/2021 plan as below except where it is noted.   Anxiety. Continue Vistaril 25 mg po tid prn. Initiated: gabapentin 100 mg po tid. . Depression. Continue Lexapro from 20 mg to 10 mg po daily.   Mood control. Continue Olanzapine 10 mg po Q hs.   Agitation protocol.  Continue as recommended (see MAR).  Insomnia. Continue Trazodone 50 mg po Q hs prn.   Other medical issues: continue'  Augment 875-125 mg bid for infection (uti). Biktarvy 50-200-25 mg po daily for HIV. Omega -3 1 tablet daily for vitamin supplementation. Flomax 0.4 mg po daily for BPH.  Vitamin D3 1 tablet daily for bone health. Mylanta 30 ml po Q 4 hrs prn for indigestion. MOM 30 ml po daily prn for constipation. Acetaminophen 650 mg po Q 4 hrs prn for pain/fever.   Encourage group participation.  Discharge disposition plan is ongoing.             Armandina Stammer, NP, pmhnp, fnp-bc. 05/07/2021, 5:10 PM

## 2021-05-07 NOTE — Progress Notes (Signed)
Progress note  Pt found in bed; compliant with medication administration. Pt wrote out their needs for this Clinical research associate. Pt had extreme anxiety most of the morning. Medication was provided with snacks which seemed to help. Pt did attend morning group. Pt states they are still having passive si with no plan. Pt is pleasant. Pt denies hi/ah/vh and verbally agrees to approach staff if these become apparent or before harming themselves/others while at [facility].  A: Pt provided support and encouragement. Pt given medication per protocol and standing orders. Q22m safety checks implemented and continued.  R: Pt safe on the unit. Will continue to monitor.

## 2021-05-07 NOTE — Progress Notes (Signed)
Quanta was isolative to his room this evening.  He did get up for his bedtime medications.  He denied any SI/HI or AVH.  He reported his depression and anxiety were both an 8 (10 the worst).  He denied any  pain or discomfort and appeared to be in no physical distress.  He is currently resting with his eyes closed and appears to be asleep.  Q 15 minutes maintained for safety.   05/06/21 2210  Psych Admission Type (Psych Patients Only)  Admission Status Voluntary  Psychosocial Assessment  Patient Complaints Depression;Anxiety  Eye Contact Fair  Facial Expression Anxious  Affect Anxious;Depressed  Speech Other (Comment) (deaf)  Interaction Assertive  Motor Activity Restless  Appearance/Hygiene In scrubs  Behavior Characteristics Cooperative  Mood Depressed;Anxious  Thought Process  Coherency Concrete thinking  Content WDL  Delusions None reported or observed  Perception Hallucinations  Hallucination Auditory  Judgment WDL  Confusion None  Danger to Self  Current suicidal ideation? Denies  Danger to Others  Danger to Others None reported or observed

## 2021-05-07 NOTE — [facility] Group Notes (Signed)
Pt did not attend group. 

## 2021-05-07 NOTE — Telephone Encounter (Signed)
Copied from CRM [id number]. Topic: General - Call Back - No Documentation >> May 05, 2021 11:08 AM Randol Kern wrote: Reason for CRM: Wyatt Portela, case manager for [employer] is requesting to speak to [facility] regarding orange card questions for the patients. She wants to know the requirements etc.   Best contact: [phone]

## 2021-05-07 NOTE — Plan of Care (Signed)
  Problem: Education: Goal: Ability to state activities that reduce stress will improve Outcome: Progressing   Problem: Coping: Goal: Ability to identify and develop effective coping behavior will improve Outcome: Progressing   Problem: Self-Concept: Goal: Ability to identify factors that promote anxiety will improve Outcome: Progressing   

## 2021-05-07 NOTE — Telephone Encounter (Signed)
I return call to Mrs. Ross, unable to LVM since is full but incase she call the Pt need to be an establish Pt of the clinic to apply for the financial program that we have

## 2021-05-08 LAB — COMPREHENSIVE METABOLIC PANEL
ALT: 22 U/L (ref 0–44)
AST: 31 U/L (ref 15–41)
Albumin: 3.8 g/dL (ref 3.5–5.0)
Alkaline Phosphatase: 83 U/L (ref 38–126)
Anion gap: 8 (ref 5–15)
BUN: 19 mg/dL (ref 6–20)
CO2: 26 mmol/L (ref 22–32)
Calcium: 8.9 mg/dL (ref 8.9–10.3)
Chloride: 110 mmol/L (ref 98–111)
Creatinine, Ser: 0.97 mg/dL (ref 0.61–1.24)
GFR, Estimated: >60 mL/min (ref >60)
Glucose, Bld: 97 mg/dL (ref 70–99)
Potassium: 3.6 mmol/L (ref 3.5–5.1)
Sodium: 144 mmol/L (ref 135–145)
Total Bilirubin: 0.5 mg/dL (ref 0.3–1.2)
Total Protein: 6.8 g/dL (ref 6.5–8.1)

## 2021-05-08 LAB — CBC WITH DIFFERENTIAL/PLATELET
Abs Immature Granulocytes: 0.03 10*3/uL (ref 0.00–0.07)
Basophils Absolute: 0.1 10*3/uL (ref 0.0–0.1)
Basophils Relative: 1 %
Eosinophils Absolute: 0.2 10*3/uL (ref 0.0–0.5)
Eosinophils Relative: 2 %
HCT: 44.1 % (ref 39.0–52.0)
Hemoglobin: 15 g/dL (ref 13.0–17.0)
Immature Granulocytes: 0 %
Lymphocytes Relative: 32 %
Lymphs Abs: 2.6 10*3/uL (ref 0.7–4.0)
MCH: 31.6 pg (ref 26.0–34.0)
MCHC: 34 g/dL (ref 30.0–36.0)
MCV: 92.8 fL (ref 80.0–100.0)
Monocytes Absolute: 0.6 10*3/uL (ref 0.1–1.0)
Monocytes Relative: 7 %
Neutro Abs: 4.8 10*3/uL (ref 1.7–7.7)
Neutrophils Relative %: 58 %
Platelets: 338 10*3/uL (ref 150–400)
RBC: 4.75 MIL/uL (ref 4.22–5.81)
RDW: 13.1 % (ref 11.5–15.5)
WBC: 8.2 10*3/uL (ref 4.0–10.5)
nRBC: 0 % (ref 0.0–0.2)

## 2021-05-08 LAB — URINALYSIS, COMPLETE (UACMP) WITH MICROSCOPIC
Bacteria, UA: NONE SEEN
Bilirubin Urine: NEGATIVE
Glucose, UA: NEGATIVE mg/dL
Ketones, ur: 5 mg/dL — AB
Nitrite: NEGATIVE
Protein, ur: 30 mg/dL — AB
RBC / HPF: >50 RBC/hpf (ref 0–5)
Specific Gravity, Urine: >1.03 — ABNORMAL HIGH (ref 1.005–1.030)
Squamous Epithelial / HPF: NONE SEEN (ref 0–5)
pH: 6 (ref 5.0–8.0)

## 2021-05-08 MED ORDER — OLANZAPINE 15 MG PO TBDP
15.0000 mg | ORAL_TABLET | Freq: Every day | ORAL | Status: DC
  Administered 2021-05-08 – 2021-05-11 (×4): 15 mg via ORAL
  Filled 2021-05-08 (×5): qty 1
  Filled 2021-05-08: qty 7

## 2021-05-08 NOTE — Group Note (Signed)
Recreation Therapy Group Note   Group Topic:Stress Management  Group Date: 05/08/2021 Start Time: 0930 End Time: 0950 Facilitators: Caroll Rancher, LRT/CTRS Location: [address] Dayroom   Group Description. Meditation. LRT introduced the stress management technique of meditation.  LRT played a meditation that focused on being resilient in the face of adversity.  Patients were to listen and follow along as meditation played to fully engage.  LRT debriefed with patients.  Patients were given suggestions of ways to access meditations post d/c and encouraged to explore Youtube and other apps available on smartphones, tablets, and computers.   Affect/Mood: N/A   Participation Level: Did not attend    Clinical Observations/Individualized Feedback: Pt did not attend group session.     Plan: Continue to engage patient in RT group sessions 2-3x/week.   Caroll Rancher, LRT/CTRS 05/08/2021 12:19 PM

## 2021-05-08 NOTE — Progress Notes (Signed)
Adult Psychoeducational Group Note  Date:  05/08/2021 Time:  9:49 PM  Group Topic/Focus:  Wrap-Up Group:   The focus of this group is to help patients review their daily goal of treatment and discuss progress on daily workbooks.  Participation Level:  Did Not Attend  Participation Quality:   Did Not Attend  Affect:   Did Not Attend  Cognitive:   Did Not Attend  Insight: None  Engagement in Group:   Did Not Attend  Modes of Intervention:   Did Not Attend  Additional Comments:  Pt did not attend the evening N.A meeting tonight.  Felipa Furnace 05/08/2021, 9:49 PM

## 2021-05-08 NOTE — Progress Notes (Signed)
[facility] MD Progress Note  05/08/2021 2:24 PM Tyler Horn  MRN:  [id number]  Subjective: (This assessment was conducted using a sign language interpreter - Amy): Tyler Horn reports, "I'm not doing good. I don't know how to explain how I'm feeling. I feel foggy-headed, & tired. I have a lot of anxiety. I have racing thoughts all the time. It is really hard for me to think clearly or make my thoughts clear so that you will understand. I still feel like hurting myself. I'm still feeling lonely. I feel like I need to sleep a lot better than I'm doing right now.  I was sleeping better on Seroquel. The anxiety is not going any where". Daily notes: Tyler Horn is seen, chart reviewed. The chart findings & treatment plan discussed with the treatment team. He is seen in his room with an interpreter. He is still expressing high levels of anxiety. This time complaining of feeling foggy-headed. He is complaining of racing thoughts. He says he is still feeling like hurting himself. He denies any auditory hallucinations today. Patient is explained that the reason his Seroquel was stopped & Olanzapine initiated was because he was reporting worsening symptoms to his outpatient provider. He is explained that we initiated the gabapentin with his consent to help with the anxiety symptoms. He is encouraged to continue the gabapentin, give it time to get in his system to start feeling the positive effect of it. Tyler Horn slept for 6.75 hours last night per documentation. Prior to today, he spends most of his time in his room sleeping all through the day & night. 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 He is encouraged now that the interpreter is physically present here on the unit, to start coming out of his room, attend group sessions/activities being offered & held on this unit to help alleviate boredom. He is encouraged to try as  much as possible to stay up during the day to sleep more at night to have a much clear head & mind during the day. He is in agreement. Tyler Horn says there are some social gatherings/events being held within his community from time to time for the deaf/hearing impaired, he says he was unable to attend these events because he is unable to afford an interpreter. The Olanzapine dose has been increased from 10 mg to 15 mg to help with the racing thoughts.  Patient is in agreement. The uses, possible adverse effects discussed with him. Reviewed vital signs, stable.  Objective: 60 year old Caucasian male with hx of Major depressive disorder. He has been a patient in this [facility] x numerous times with similar presentations. His last admission here at [facility] was in February of this year, 2022. He was here for 7 days at that time. He is being admitted to the [facility] this time around from the [facility] with complaint of worsening symptoms of depression & suicidal ideations of 1 week with plans to cut himself with a kitchen knife. He is admitted for mood stabilization treatments  Principal Problem: Major depressive disorder, recurrent, severe with psychotic features (HCC)  Diagnosis: Principal Problem:   Major depressive disorder, recurrent, severe with psychotic features (HCC) Active Problems:   Suicidal thoughts   Benzodiazepine misuse   Generalized anxiety disorder  Total Time spent with patient:  35 minutes  Past Psychiatric History: See H&P  Past Medical History:  Past Medical History:  Diagnosis Date   Anxiety    Chronic hepatitis C (HCC) 04/27/2020   Depression    HIV (human immunodeficiency virus infection) (HCC)    HIV (human immunodeficiency virus infection) (HCC)     Past Surgical History:  Procedure Laterality Date   CATARACT EXTRACTION Left    Family History:  Family History  Problem Relation Age of Onset   Mental illness Mother    Bipolar disorder Brother    Family Psychiatric   History: See H&P  Social History:  Social History   Substance and Sexual Activity  Alcohol Use No     Social History   Substance and Sexual Activity  Drug Use No    Social History   Socioeconomic History   Marital status: Single    Spouse name: Not on file   Number of children: Not on file   Years of education: Not on file   Highest education level: Not on file  Occupational History   Not on file  Tobacco Use   Smoking status: Never   Smokeless tobacco: Never  Vaping Use   Vaping Use: Never used  Substance and Sexual Activity   Alcohol use: No   Drug use: No   Sexual activity: Yes    Comment: pt given condoms  Other Topics Concern   Not on file  Social History Narrative   Not on file   Social Determinants of Health   Financial Resource Strain: Not on file  Food Insecurity: Not on file  Transportation Needs: Not on file  Physical Activity: Not on file  Stress: Not on file  Social Connections: Not on file   Additional Social History:   Sleep: Good, 6.75 oer documentation.  Appetite:  Good  Current Medications: Current Facility-Administered Medications  Medication Dose Route Frequency Provider Last Rate Last Admin   acetaminophen (TYLENOL) tablet 650 mg  650 mg Oral Q6H PRN Novella Olive, NP       alum & mag hydroxide-simeth (MAALOX/MYLANTA) 200-200-20 MG/5ML suspension 30 mL  30 mL Oral Q4H PRN Novella Olive, NP       amoxicillin-clavulanate (AUGMENTIN) 875-125 MG per tablet 1 tablet  1 tablet Oral Q12H Novella Olive, NP   1 tablet at 05/08/21 0751   bictegravir-emtricitabine-tenofovir AF (BIKTARVY) 50-200-25 MG per tablet 1 tablet  1 tablet Oral Daily Jaclyn Shaggy, PA-C   1 tablet at 05/08/21 0751   escitalopram (LEXAPRO) tablet 20 mg  20 mg Oral Daily Novella Olive, NP   20 mg at 05/08/21 0751   gabapentin (NEURONTIN) capsule 100 mg  100 mg Oral TID Armandina Stammer I, NP   100 mg at 05/08/21 1134   hydrOXYzine (ATARAX/VISTARIL) tablet 25 mg  25 mg Oral  TID PRN Novella Olive, NP   25 mg at 05/07/21 0857   magnesium hydroxide (MILK OF MAGNESIA) suspension 30 mL  30 mL Oral Daily PRN Novella Olive, NP   30 mL at 05/08/21 0836   OLANZapine zydis (ZYPREXA) disintegrating tablet 15 mg  15 mg Oral QHS Kimberlee Shoun, Nicole Kindred I, NP       OLANZapine zydis (ZYPREXA) disintegrating tablet 5 mg  5 mg Oral Q8H PRN Novella Olive, NP   5 mg at 05/08/21 0454   And   ziprasidone (GEODON) injection 20 mg  20 mg Intramuscular PRN Novella Olive, NP       omega-3 acid ethyl esters (LOVAZA) capsule 1 g  1 g Oral Daily Jaclyn Shaggy,  PA-C   1 g at 05/08/21 0751   tamsulosin (FLOMAX) capsule 0.4 mg  0.4 mg Oral Daily Jaclyn Shaggy, PA-C   0.4 mg at 05/08/21 0751   traZODone (DESYREL) tablet 50 mg  50 mg Oral QHS PRN Novella Olive, NP       Vitamin D3 (Vitamin D) tablet 1,000 Units  1,000 Units Oral Daily Jaclyn Shaggy, PA-C   1,000 Units at 05/08/21 4098    Lab Results:  Results for orders placed or performed during the hospital encounter of 05/05/21 (from the past 48 hour(s))  Comprehensive metabolic panel     Status: None   Collection Time: 05/08/21  6:29 AM  Result Value Ref Range   Sodium 144 135 - 145 mmol/L   Potassium 3.6 3.5 - 5.1 mmol/L   Chloride 110 98 - 111 mmol/L   CO2 26 22 - 32 mmol/L   Glucose, Bld 97 70 - 99 mg/dL    Comment: Glucose reference range applies only to samples taken after fasting for at least 8 hours.   BUN 19 6 - 20 mg/dL   Creatinine, Ser 1.19 0.61 - 1.24 mg/dL   Calcium 8.9 8.9 - 14.7 mg/dL   Total Protein 6.8 6.5 - 8.1 g/dL   Albumin 3.8 3.5 - 5.0 g/dL   AST 31 15 - 41 U/L   ALT 22 0 - 44 U/L   Alkaline Phosphatase 83 38 - 126 U/L   Total Bilirubin 0.5 0.3 - 1.2 mg/dL   GFR, Estimated >82 >95 mL/min    Comment: (NOTE) Calculated using the CKD-EPI Creatinine Equation (2021)    Anion gap 8 5 - 15    Comment: Performed at [facility], [address], [place], [place] [address]  CBC with  Differential/Platelet     Status: None   Collection Time: 05/08/21  6:29 AM  Result Value Ref Range   WBC 8.2 4.0 - 10.5 K/uL   RBC 4.75 4.22 - 5.81 MIL/uL   Hemoglobin 15.0 13.0 - 17.0 g/dL   HCT 86.5 78.4 - 69.6 %   MCV 92.8 80.0 - 100.0 fL   MCH 31.6 26.0 - 34.0 pg   MCHC 34.0 30.0 - 36.0 g/dL   RDW 29.5 28.4 - 13.2 %   Platelets 338 150 - 400 K/uL   nRBC 0.0 0.0 - 0.2 %   Neutrophils Relative % 58 %   Neutro Abs 4.8 1.7 - 7.7 K/uL   Lymphocytes Relative 32 %   Lymphs Abs 2.6 0.7 - 4.0 K/uL   Monocytes Relative 7 %   Monocytes Absolute 0.6 0.1 - 1.0 K/uL   Eosinophils Relative 2 %   Eosinophils Absolute 0.2 0.0 - 0.5 K/uL   Basophils Relative 1 %   Basophils Absolute 0.1 0.0 - 0.1 K/uL   Immature Granulocytes 0 %   Abs Immature Granulocytes 0.03 0.00 - 0.07 K/uL    Comment: Performed at [facility], [address], [place], [place] [address]   Blood Alcohol level:  Lab Results  Component Value Date   [facility] <10 05/05/2021   ETH <10 11/26/2019   Metabolic Disorder Labs: Lab Results  Component Value Date   HGBA1C 5.0 05/06/2021   MPG 96.8 05/06/2021   MPG 99.67 10/18/2020   No results found for: PROLACTIN Lab Results  Component Value Date   CHOL 138 05/06/2021   TRIG 77 05/06/2021   HDL 33 (L) 05/06/2021   CHOLHDL 4.2 05/06/2021  VLDL 15 05/06/2021   LDLCALC 90 05/06/2021   LDLCALC 93 10/18/2020   Physical Findings: AIMS: Facial and Oral Movements Muscles of Facial Expression: None, normal Lips and Perioral Area: Mild Jaw: Minimal Tongue: Minimal,Extremity Movements Upper (arms, wrists, hands, fingers): None, normal Lower (legs, knees, ankles, toes): None, normal, Trunk Movements Neck, shoulders, hips: None, normal, Overall Severity Severity of abnormal movements (highest score from questions above): Mild Incapacitation due to abnormal movements: Minimal Patient's awareness of abnormal movements (rate only patient's report): Aware, no  distress, Dental Status Current problems with teeth and/or dentures?: No Does patient usually wear dentures?: No  CIWA:    COWS:     Musculoskeletal: Strength & Muscle Tone: within normal limits Gait & Station: normal Patient leans: N/A  Psychiatric Specialty Exam:  Presentation  General Appearance: Appropriate for Environment; Disheveled  Eye Contact:Fair  Speech:Other (comment) (Sign language)  Speech Volume:Other (comment) (Sign language)  Handedness:Ambidextrous  Mood and Affect  Mood:Depressed; Anxious  Affect:Restricted  Thought Process  Thought Processes:Coherent; Goal Directed  Descriptions of Associations:Intact  Orientation:Full (Time, Place and Person)  Thought Content:Perseveration; Rumination  History of Schizophrenia/Schizoaffective disorder:No  Duration of Psychotic Symptoms:Less than six months  Hallucinations:Hallucinations: None Description of Visual Hallucinations: NA  Ideas of Reference:None  Suicidal Thoughts:Suicidal Thoughts: Yes, Passive SI Active Intent and/or Plan: Without Intent; Without Plan; Without Means to Carry Out; Without Access to Means SI Passive Intent and/or Plan: Without Intent; Without Plan; Without Access to Means; Without Means to Carry Out  Homicidal Thoughts:Homicidal Thoughts: No  Sensorium  Memory:Immediate Good; Recent Good; Remote Good  Judgment:Fair  Insight:Fair  Executive Functions  Concentration:Fair  Attention Span:Fair  Recall:Fair  Fund of Knowledge:Fair  Language:-- (Sign language)  Psychomotor Activity  Psychomotor Activity:Psychomotor Activity: Normal  Assets  Assets:Communication Skills; Desire for Improvement; Housing; Physical Health; Resilience  Sleep  Sleep:Sleep: Good Number of Hours of Sleep: 6.75  Physical Exam: Physical Exam Vitals and nursing note reviewed.  HENT:     Head: Normocephalic.     Ears:     Comments: Deafness    Nose: Nose normal.  Cardiovascular:      Rate and Rhythm: Normal rate.     Pulses: Normal pulses.  Pulmonary:     Effort: Pulmonary effort is normal.  Genitourinary:    Comments: Hx. BPH Musculoskeletal:        General: Normal range of motion.     Cervical back: Normal range of motion.  Skin:    General: Skin is warm and dry.  Neurological:     General: No focal deficit present.     Mental Status: He is alert and oriented to person, place, and time.   Review of Systems  Constitutional:  Negative for chills, diaphoresis and fever.  HENT:  Positive for hearing loss. Negative for congestion and sore throat.   Eyes:  Negative for blurred vision.  Respiratory:  Negative for cough, shortness of breath and wheezing.   Cardiovascular:  Negative for chest pain and palpitations.  Gastrointestinal:  Negative for abdominal pain, blood in stool, constipation, diarrhea, heartburn, melena, nausea and vomiting.  Genitourinary:  Negative for dysuria.  Musculoskeletal:  Negative for joint pain and myalgias.  Skin: Negative.   Neurological:  Negative for dizziness, tingling, tremors, sensory change, speech change, focal weakness, seizures, loss of consciousness, weakness and headaches.  Endo/Heme/Allergies:  Negative for environmental allergies and polydipsia. Does not bruise/bleed easily.       Allergies: NKDA  Psychiatric/Behavioral:  Positive for depression. Negative for  hallucinations (Hx. VH), memory loss, substance abuse and suicidal ideas. The patient is nervous/anxious. The patient does not have insomnia.   Blood pressure 115/76, pulse 98, temperature 97.7 F (36.5 C), temperature source Oral, resp. rate 16, height 6\' 4"  (1.93 m), weight 81.6 kg, SpO2 99 %. Body mass index is 21.91 kg/m.  Treatment Plan Summary: Daily contact with patient to assess and evaluate symptoms and progress in treatment and Medication management  Continue inpatient hospitalization.  Will continue today 05/08/2021 plan as below except where it is noted.    Anxiety. Continue Vistaril 25 mg po tid prn. Continue gabapentin 100 mg po tid. . Depression. Continue Lexapro from 20 mg to 10 mg po daily.   Mood control. Increased Olanzapine from 10 mg to 15 po Q hs.   Agitation protocol.  Continue as recommended (see MAR).                  Insomnia. Continue Trazodone 50 mg po Q hs prn.   Other medical issues: continue'  Augment 875-125 mg bid for infection (uti). Biktarvy 50-200-25 mg po daily for HIV. Omega -3 1 tablet daily for vitamin supplementation. Flomax 0.4 mg po daily for BPH.  Vitamin D3 1 tablet daily for bone health. Mylanta 30 ml po Q 4 hrs prn for indigestion. MOM 30 ml po daily prn for constipation. Acetaminophen 650 mg po Q 4 hrs prn for pain/fever.   Encourage group participation.  Discharge disposition plan is ongoing.             Armandina Stammer, NP, pmhnp, fnp-bc. 05/08/2021, 2:24 PM Patient ID: [name], male   DOB: 1961-03-14, 60 y.o.   MRN: [id number]

## 2021-05-08 NOTE — Progress Notes (Signed)
Keanon continues to isolate to his room.  He reported his anxiety 10/10 and depression 7/10 (10 the worst).  He reported his day as "so so."  He denied any pain or discomfort and appears to be in no physical distress.  He denied SI/HI or AVH.  Q 15 minute checks maintained for safety.     05/07/21 2223  Psych Admission Type (Psych Patients Only)  Admission Status Voluntary  Psychosocial Assessment  Patient Complaints Depression;Anxiety  Eye Contact Fair  Facial Expression Sad  Affect Anxious;Sad  Speech Other (Comment) (Pt is deaf)  Interaction Assertive  Motor Activity Slow  Appearance/Hygiene Unremarkable  Behavior Characteristics Appropriate to situation;Cooperative  Mood Depressed;Anxious;Sad  Thought Administrator, sports thinking  Content WDL  Delusions Somatic  Perception WDL  Hallucination None reported or observed  Judgment Poor  Confusion None  Danger to Self  Current suicidal ideation? Denies  Danger to Others  Danger to Others None reported or observed

## 2021-05-08 NOTE — Progress Notes (Signed)
Pt's assessment completed via writings. Pt presents fidgety /restless and irritable on interactions for majority of this morning and early afternoon. Reports being anxious and dizzy at the beginning of this shift. Rates his depression 9/10, hopelessness 0/10 and anxiety 10/10 with poor concentration level. Pt reports he fair sleep last night "I was up multiple times last night. I had problem staying asleep". Declined PRN Vistaril when offered of anxiety; pt wrote on paper "I want Ativan". Noted to be more irritable when told Ativan was d/c'd.  Orthostatic vitals done, documented in flow sheets. Q 15 minutes safety checks maintained without self harm gestures or outburst to note thus far. Emotional support and encouragement offered to pt this shift. Attended and participated in scheduled groups with assigned translator this afternoon. Went off unit for meals and returned without issues. Denies concerns at this time.

## 2021-05-08 NOTE — [facility] Group Notes (Signed)
Topic:   Due to the acuity and complex discharge plans, group was not held. Patient was provided therapeutic worksheets and asked to meet with CSW as needed. 

## 2021-05-08 NOTE — [facility] Group Notes (Signed)
Adult Psychoeducational Group Note  Date:  05/08/2021 Time:  5:15 PM  Group Topic/Focus:  Personal Choices and Values:   The focus of this group is to help patients assess and explore the importance of values in their lives, how their values affect their decisions, how they express their values and what opposes their expression.  Participation Level:  Active  Participation Quality:  Appropriate  Affect:  Appropriate  Cognitive:  Alert  Insight: Appropriate  Engagement in Group:  Engaged  Modes of Intervention:  Socialization  Additional Comments:  Pt attended group, was very open in group as well.   Beckie Busing 05/08/2021, 5:15 PM

## 2021-05-08 NOTE — Progress Notes (Signed)
   05/08/21 2114  Psych Admission Type (Psych Patients Only)  Admission Status Voluntary  Psychosocial Assessment  Patient Complaints Anxiety  Eye Contact Fair  Facial Expression Sad;Worried  Affect Appropriate to circumstance  Speech Other (Comment) (Patient is deaf. Patient is mute.)  Interaction Assertive  Motor Activity Restless  Appearance/Hygiene Unremarkable  Behavior Characteristics Cooperative;Appropriate to situation  Mood Depressed;Sad  Thought Administrator, sports thinking  Content WDL  Delusions Somatic  Perception WDL  Hallucination None reported or observed  Judgment Poor  Confusion None  Danger to Self  Current suicidal ideation? Passive  Self-Injurious Behavior No self-injurious ideation or behavior indicators observed or expressed   Agreement Not to Harm Self Yes  Description of Agreement Verbal Contract  Danger to Others  Danger to Others None reported or observed

## 2021-05-09 NOTE — [facility] Group Notes (Signed)
Pt did not attend group. 

## 2021-05-09 NOTE — Progress Notes (Signed)
Patient denies SI, HI, and AVH.  Patient was compliant with medications attended groups and engaged appropriately in the milieu.  Patient had no behavioral events this shift.  Assess patient for safety, offer medications as prescribed, engage patient in therapeutic staff talks.  Patient is able to contract for safety. Continue to monitor as planned.  

## 2021-05-09 NOTE — Progress Notes (Signed)
[facility] MD Progress Note  05/09/2021 1:59 PM Tyler Horn  MRN:  [id number]  Subjective: (This assessment was conducted using a sign language interpreter - Amy): Tyler Horn reports, "I'm feeling very depressed because I feel lonely, not much of an anxiety today, just depression. There is really no one that cares about me. My mother has been trying to help me, but there are a lot of misunderstanding going on. My mind is a mush right now, I can't even make any decisions. I really hate myself right now. I want to die to stop this suffering. I don't think that anyone in my family loves or cares about me". Daily notes: Tyler Horn is seen, chart reviewed. The chart findings & treatment plan discussed with the treatment team. He is seen with an interpreter during this follow-up care evaluation. He is still expressing feeling very depressed, wants to die to end his suffering. He denies any foggy-headedness today. Denies any visual hallucinations. He denies any auditory hallucinations. Tyler Horn was provided with some resources containing events being held within the community at different times/days for the deaf & hearing impaired to attend & socialize with others, but, he angrily pushed the resources away, says he has always known about these events & declines to accept the resources. He regrets that he does not have the love or support of his family. His Olanzapine was increased from 10 to 15 mg yesterday. He remains on the Lexapro 20 mg. He is encouraged to attend/participate in the group sessions. Will re-evaluate tomorrow.  Objective: 60 year old Caucasian male with hx of Major depressive disorder. He has been a patient in this [facility] x numerous times with similar presentations. His last admission here at [facility] was in February of this year, 2022. He was here for 7 days at that time. He is being admitted to the [facility] this time around from the [facility] with complaint of worsening symptoms of depression & suicidal ideations  of 1 week with plans to cut himself with a kitchen knife. He is admitted for mood stabilization treatments  Principal Problem: Major depressive disorder, recurrent, severe with psychotic features (HCC)  Diagnosis: Principal Problem:   Major depressive disorder, recurrent, severe with psychotic features (HCC) Active Problems:   Suicidal thoughts   Benzodiazepine misuse   Generalized anxiety disorder   MDD (major depressive disorder), recurrent severe, without psychosis (HCC)  Total Time spent with patient:  35 minutes  Past Psychiatric History: See H&P  Past Medical History:  Past Medical History:  Diagnosis Date   Anxiety    Chronic hepatitis C (HCC) 04/27/2020   Depression    HIV (human immunodeficiency virus infection) (HCC)    HIV (human immunodeficiency virus infection) (HCC)     Past Surgical History:  Procedure Laterality Date   CATARACT EXTRACTION Left    Family History:  Family History  Problem Relation Age of Onset   Mental illness Mother    Bipolar disorder Brother    Family Psychiatric  History: See H&P  Social History:  Social History   Substance and Sexual Activity  Alcohol Use No     Social History   Substance and Sexual Activity  Drug Use No    Social History   Socioeconomic History   Marital status: Single    Spouse name: Not on file   Number of children: Not on file   Years of education: Not on file   Highest education level: Not on file  Occupational History   Not on  file  Tobacco Use   Smoking status: Never   Smokeless tobacco: Never  Vaping Use   Vaping Use: Never used  Substance and Sexual Activity   Alcohol use: No   Drug use: No   Sexual activity: Yes    Comment: pt given condoms  Other Topics Concern   Not on file  Social History Narrative   Not on file   Social Determinants of Health   Financial Resource Strain: Not on file  Food Insecurity: Not on file  Transportation Needs: Not on file  Physical Activity: Not on  file  Stress: Not on file  Social Connections: Not on file   Additional Social History:   Sleep: Good, 6.75 oer documentation.  Appetite:  Good  Current Medications: Current Facility-Administered Medications  Medication Dose Route Frequency Provider Last Rate Last Admin   acetaminophen (TYLENOL) tablet 650 mg  650 mg Oral Q6H PRN Novella Olive, NP       alum & mag hydroxide-simeth (MAALOX/MYLANTA) 200-200-20 MG/5ML suspension 30 mL  30 mL Oral Q4H PRN Novella Olive, NP       amoxicillin-clavulanate (AUGMENTIN) 875-125 MG per tablet 1 tablet  1 tablet Oral Q12H Novella Olive, NP   1 tablet at 05/09/21 0748   bictegravir-emtricitabine-tenofovir AF (BIKTARVY) 50-200-25 MG per tablet 1 tablet  1 tablet Oral Daily Jaclyn Shaggy, PA-C   1 tablet at 05/09/21 0748   escitalopram (LEXAPRO) tablet 20 mg  20 mg Oral Daily Novella Olive, NP   20 mg at 05/09/21 0748   gabapentin (NEURONTIN) capsule 100 mg  100 mg Oral TID Armandina Stammer I, NP   100 mg at 05/09/21 1318   hydrOXYzine (ATARAX/VISTARIL) tablet 25 mg  25 mg Oral TID PRN Novella Olive, NP   25 mg at 05/08/21 2114   magnesium hydroxide (MILK OF MAGNESIA) suspension 30 mL  30 mL Oral Daily PRN Novella Olive, NP   30 mL at 05/09/21 0750   OLANZapine zydis (ZYPREXA) disintegrating tablet 15 mg  15 mg Oral QHS Bertine Schlottman, Nicole Kindred I, NP   15 mg at 05/08/21 2116   OLANZapine zydis (ZYPREXA) disintegrating tablet 5 mg  5 mg Oral Q8H PRN Novella Olive, NP   5 mg at 05/08/21 1610   And   ziprasidone (GEODON) injection 20 mg  20 mg Intramuscular PRN Novella Olive, NP       omega-3 acid ethyl esters (LOVAZA) capsule 1 g  1 g Oral Daily Melbourne Abts W, PA-C   1 g at 05/09/21 0748   tamsulosin (FLOMAX) capsule 0.4 mg  0.4 mg Oral Daily Melbourne Abts W, PA-C   0.4 mg at 05/09/21 0748   traZODone (DESYREL) tablet 50 mg  50 mg Oral QHS PRN Novella Olive, NP   50 mg at 05/08/21 2114   Vitamin D3 (Vitamin D) tablet 1,000 Units  1,000 Units Oral Daily Jaclyn Shaggy, PA-C   1,000 Units at 05/09/21 9604    Lab Results:  Results for orders placed or performed during the hospital encounter of 05/05/21 (from the past 48 hour(s))  Urinalysis, Complete w Microscopic Urine, Clean Catch     Status: Abnormal   Collection Time: 05/07/21  8:00 PM  Result Value Ref Range   Color, Urine YELLOW YELLOW   APPearance HAZY (A) CLEAR   Specific Gravity, Urine >1.030 (H) 1.005 - 1.030   pH 6.0 5.0 - 8.0   Glucose, UA NEGATIVE NEGATIVE mg/dL  Hgb urine dipstick LARGE (A) NEGATIVE   Bilirubin Urine NEGATIVE NEGATIVE   Ketones, ur 5 (A) NEGATIVE mg/dL   Protein, ur 30 (A) NEGATIVE mg/dL   Nitrite NEGATIVE NEGATIVE   Leukocytes,Ua TRACE (A) NEGATIVE   Squamous Epithelial / LPF NONE SEEN 0 - 5   WBC, UA 21-50 0 - 5 WBC/hpf   RBC / HPF >50 0 - 5 RBC/hpf   Bacteria, UA NONE SEEN NONE SEEN   Mucus PRESENT     Comment: Performed at [facility], [address] [place], [place] [address]  Comprehensive metabolic panel     Status: None   Collection Time: 05/08/21  6:29 AM  Result Value Ref Range   Sodium 144 135 - 145 mmol/L   Potassium 3.6 3.5 - 5.1 mmol/L   Chloride 110 98 - 111 mmol/L   CO2 26 22 - 32 mmol/L   Glucose, Bld 97 70 - 99 mg/dL    Comment: Glucose reference range applies only to samples taken after fasting for at least 8 hours.   BUN 19 6 - 20 mg/dL   Creatinine, Ser 2.84 0.61 - 1.24 mg/dL   Calcium 8.9 8.9 - 13.2 mg/dL   Total Protein 6.8 6.5 - 8.1 g/dL   Albumin 3.8 3.5 - 5.0 g/dL   AST 31 15 - 41 U/L   ALT 22 0 - 44 U/L   Alkaline Phosphatase 83 38 - 126 U/L   Total Bilirubin 0.5 0.3 - 1.2 mg/dL   GFR, Estimated >44 >01 mL/min    Comment: (NOTE) Calculated using the CKD-EPI Creatinine Equation (2021)    Anion gap 8 5 - 15    Comment: Performed at [facility], [address] [place], [place] [address]  CBC with Differential/Platelet     Status: None   Collection Time: 05/08/21  6:29 AM  Result  Value Ref Range   WBC 8.2 4.0 - 10.5 K/uL   RBC 4.75 4.22 - 5.81 MIL/uL   Hemoglobin 15.0 13.0 - 17.0 g/dL   HCT 36.6 44.0 - 34.7 %   MCV 92.8 80.0 - 100.0 fL   MCH 31.6 26.0 - 34.0 pg   MCHC 34.0 30.0 - 36.0 g/dL   RDW 42.5 95.6 - 38.7 %   Platelets 338 150 - 400 K/uL   nRBC 0.0 0.0 - 0.2 %   Neutrophils Relative % 58 %   Neutro Abs 4.8 1.7 - 7.7 K/uL   Lymphocytes Relative 32 %   Lymphs Abs 2.6 0.7 - 4.0 K/uL   Monocytes Relative 7 %   Monocytes Absolute 0.6 0.1 - 1.0 K/uL   Eosinophils Relative 2 %   Eosinophils Absolute 0.2 0.0 - 0.5 K/uL   Basophils Relative 1 %   Basophils Absolute 0.1 0.0 - 0.1 K/uL   Immature Granulocytes 0 %   Abs Immature Granulocytes 0.03 0.00 - 0.07 K/uL    Comment: Performed at [facility], [address], [place], [place] [address]   Blood Alcohol level:  Lab Results  Component Value Date   [facility] <10 05/05/2021   ETH <10 11/26/2019   Metabolic Disorder Labs: Lab Results  Component Value Date   HGBA1C 5.0 05/06/2021   MPG 96.8 05/06/2021   MPG 99.67 10/18/2020   No results found for: PROLACTIN Lab Results  Component Value Date   CHOL 138 05/06/2021   TRIG 77 05/06/2021   HDL 33 (L) 05/06/2021   CHOLHDL 4.2 05/06/2021  VLDL 15 05/06/2021   LDLCALC 90 05/06/2021   LDLCALC 93 10/18/2020   Physical Findings: AIMS: Facial and Oral Movements Muscles of Facial Expression: None, normal Lips and Perioral Area: Minimal (when communicating) Jaw: None, normal Tongue: None, normal,Extremity Movements Upper (arms, wrists, hands, fingers): None, normal Lower (legs, knees, ankles, toes): None, normal, Trunk Movements Neck, shoulders, hips: None, normal, Overall Severity Severity of abnormal movements (highest score from questions above): Minimal Incapacitation due to abnormal movements: None, normal Patient's awareness of abnormal movements (rate only patient's report): Aware, no distress, Dental Status Current problems  with teeth and/or dentures?: No Does patient usually wear dentures?: No  CIWA:    COWS:     Musculoskeletal: Strength & Muscle Tone: within normal limits Gait & Station: normal Patient leans: N/A  Psychiatric Specialty Exam:  Presentation  General Appearance: Appropriate for Environment; Disheveled  Eye Contact:Fair  Speech:Other (comment) (Sign language)  Speech Volume:Other (comment) (Sign language)  Handedness:Ambidextrous  Mood and Affect  Mood:Depressed; Anxious  Affect:Restricted  Thought Process  Thought Processes:Coherent; Goal Directed  Descriptions of Associations:Intact  Orientation:Full (Time, Place and Person)  Thought Content:Perseveration; Rumination  History of Schizophrenia/Schizoaffective disorder:No  Duration of Psychotic Symptoms:Less than six months  Hallucinations:Hallucinations: None Description of Visual Hallucinations: NA  Ideas of Reference:None  Suicidal Thoughts:Suicidal Thoughts: Yes, Passive SI Active Intent and/or Plan: Without Intent; Without Plan; Without Means to Carry Out; Without Access to Means SI Passive Intent and/or Plan: Without Intent; Without Plan; Without Access to Means; Without Means to Carry Out  Homicidal Thoughts:Homicidal Thoughts: No  Sensorium  Memory:Immediate Good; Recent Good; Remote Good  Judgment:Fair  Insight:Fair  Executive Functions  Concentration:Fair  Attention Span:Fair  Recall:Fair  Fund of Knowledge:Fair  Language:-- (Sign language)  Psychomotor Activity  Psychomotor Activity:Psychomotor Activity: Normal  Assets  Assets:Communication Skills; Desire for Improvement; Housing; Physical Health; Resilience  Sleep  Sleep:Sleep: Good Number of Hours of Sleep: 6.75  Physical Exam: Physical Exam Vitals and nursing note reviewed.  HENT:     Head: Normocephalic.     Ears:     Comments: Deafness    Nose: Nose normal.  Cardiovascular:     Rate and Rhythm: Normal rate.      Pulses: Normal pulses.  Pulmonary:     Effort: Pulmonary effort is normal.  Genitourinary:    Comments: Hx. BPH Musculoskeletal:        General: Normal range of motion.     Cervical back: Normal range of motion.  Skin:    General: Skin is warm and dry.  Neurological:     General: No focal deficit present.     Mental Status: He is alert and oriented to person, place, and time.   Review of Systems  Constitutional:  Negative for chills, diaphoresis and fever.  HENT:  Positive for hearing loss. Negative for congestion and sore throat.   Eyes:  Negative for blurred vision.  Respiratory:  Negative for cough, shortness of breath and wheezing.   Cardiovascular:  Negative for chest pain and palpitations.  Gastrointestinal:  Negative for abdominal pain, blood in stool, constipation, diarrhea, heartburn, melena, nausea and vomiting.  Genitourinary:  Negative for dysuria.  Musculoskeletal:  Negative for joint pain and myalgias.  Skin: Negative.   Neurological:  Negative for dizziness, tingling, tremors, sensory change, speech change, focal weakness, seizures, loss of consciousness, weakness and headaches.  Endo/Heme/Allergies:  Negative for environmental allergies and polydipsia. Does not bruise/bleed easily.       Allergies: NKDA  Psychiatric/Behavioral:  Positive for depression. Negative for hallucinations (Hx. VH), memory loss, substance abuse and suicidal ideas. The patient is nervous/anxious. The patient does not have insomnia.   Blood pressure 127/80, pulse (!) 102, temperature 97.7 F (36.5 C), temperature source Oral, resp. rate 16, height 6\' 4"  (1.93 m), weight 81.6 kg, SpO2 99 %. Body mass index is 21.91 kg/m.  Treatment Plan Summary: Daily contact with patient to assess and evaluate symptoms and progress in treatment and Medication management  Continue inpatient hospitalization.  Will continue today 05/09/2021 plan as below except where it is noted.   Anxiety. Continue Vistaril  25 mg po tid prn. Continue gabapentin 100 mg po tid. . Depression. Continue Lexapro from 20 mg to 10 mg po daily.   Mood control. Increased Olanzapine from 10 mg to 15 po Q hs.   Agitation protocol.  Continue as recommended (see MAR).                  Insomnia. Continue Trazodone 50 mg po Q hs prn.   Other medical issues: continue'  Augment 875-125 mg bid for infection (uti). Biktarvy 50-200-25 mg po daily for HIV. Omega -3 1 tablet daily for vitamin supplementation. Flomax 0.4 mg po daily for BPH.  Vitamin D3 1 tablet daily for bone health. Mylanta 30 ml po Q 4 hrs prn for indigestion. MOM 30 ml po daily prn for constipation. Acetaminophen 650 mg po Q 4 hrs prn for pain/fever.   Encourage group participation.  Discharge disposition plan is ongoing.             Armandina Stammer, NP, pmhnp, fnp-bc. 05/09/2021, 1:59 PM Patient ID: Tyler Horn, male   DOB: [birth year], 60 y.o.   MRN: [id number] Patient ID: Tyler Horn, male   DOB: [birth year], 60 y.o.   MRN: [id number]

## 2021-05-09 NOTE — Progress Notes (Signed)
D: patient appears as anxious. Patient voiced no complaints to this writer when asked via paper and pen. Patient denies SI/HI and AVH at this time.  A: Administered medication as per MAR orders. Encouraged patient to come to staff with any concerns. R: Patient is medication compliant. Patient remains safe on the unit at this time.  05/09/21 2125  Psych Admission Type (Psych Patients Only)  Admission Status Voluntary  Psychosocial Assessment  Patient Complaints None  Eye Contact Fair  Facial Expression Anxious  Affect Anxious  Speech Other (Comment) (pt is deaf)  Interaction Assertive  Motor Activity Slow  Appearance/Hygiene Unremarkable  Behavior Characteristics Cooperative  Mood Sullen  Thought Process  Coherency WDL  Content WDL  Delusions None reported or observed  Perception WDL  Hallucination None reported or observed  Judgment Poor  Confusion None  Danger to Self  Current suicidal ideation? Denies  Self-Injurious Behavior No self-injurious ideation or behavior indicators observed or expressed   Agreement Not to Harm Self Yes  Description of Agreement written contract  Danger to Others  Danger to Others None reported or observed

## 2021-05-09 NOTE — Group Note (Signed)
LCSW Group Therapy Note  Group Date: 05/09/2021 Start Time: 1100 End Time: 1200   Type of Therapy and Topic:  Group Therapy: Anger Cues and Responses  Participation Level:  Active   Description of Group:   In this group, patients learned how to recognize the physical, cognitive, emotional, and behavioral responses they have to anger-provoking situations.  They identified a recent time they became angry and how they reacted.  They analyzed how their reaction was possibly beneficial and how it was possibly unhelpful.  The group discussed a variety of healthier coping skills that could help with such a situation in the future.  Focus was placed on how helpful it is to recognize the underlying emotions to our anger, because working on those can lead to a more permanent solution as well as our ability to focus on the important rather than the urgent.  Therapeutic Goals: Patients will remember their last incident of anger and how they felt emotionally and physically, what their thoughts were at the time, and how they behaved. Patients will identify how their behavior at that time worked for them, as well as how it worked against them. Patients will explore possible new behaviors to use in future anger situations. Patients will learn that anger itself is normal and cannot be eliminated, and that healthier reactions can assist with resolving conflict rather than worsening situations.  Summary of Patient Progress:  Tyler Horn was intermittently active during the group. He shared a recent occurrence wherein feeling judged and like someone was making fun of his disability led to anger. He said he waited until calm and then confronted the person calmly.  The patient was in and out of the room several times saying he needed to use the bathroom, did not appear to be engaged as he usually is duringf groups.  He demonstrated good insight into the subject matter, was respectful of peers, and participated throughout  the entire session.  Therapeutic Modalities:   Cognitive Behavioral Therapy    [name] 05/09/2021  12:47 PM

## 2021-05-10 MED ORDER — CARBAMAZEPINE 200 MG PO TABS
200.0000 mg | ORAL_TABLET | Freq: Every day | ORAL | Status: DC
  Administered 2021-05-10 – 2021-05-11 (×2): 200 mg via ORAL
  Filled 2021-05-10 (×5): qty 1

## 2021-05-10 MED ORDER — BISACODYL 5 MG PO TBEC
10.0000 mg | DELAYED_RELEASE_TABLET | Freq: Once | ORAL | Status: AC
  Administered 2021-05-10: 10 mg via ORAL
  Filled 2021-05-10 (×2): qty 2

## 2021-05-10 MED ORDER — POLYETHYLENE GLYCOL 3350 17 G PO PACK
17.0000 g | PACK | Freq: Every day | ORAL | Status: DC
  Administered 2021-05-10 – 2021-05-12 (×3): 17 g via ORAL
  Filled 2021-05-10 (×6): qty 1

## 2021-05-10 NOTE — Progress Notes (Addendum)
Pt is noted to have a Foley Catheter 1000cc leg bag placed by outpt urologist 1 month ago per pt. It is patent. Pt provides self cath care and emptying. Pt's NP was notified that it is present. Charge RN also aware. Pt requests a new replacement leg bag, and charge RN calling [facility] for supplies to be delivered. Pt denies pain or discomfort. Will continue to monitor.   Pt was given a new leg bag that came from [facility] hospital to change old one out to new.

## 2021-05-10 NOTE — Group Note (Signed)
[facility] LCSW Group Therapy Note  05/10/2021  10:00-11:00AM  Type of Therapy and Topic:  Group Therapy:  Building Supports  Participation Level:  Active   Description of Group:  Patients in this group were introduced to the idea of adding a variety of healthy supports to address the various needs in their lives.  Different types of support were defined and described, and patients were asked to act out what each type could be.  Patients discussed what additional healthy supports could be helpful in their recovery and wellness after discharge in order to prevent future hospitalizations.   An emphasis was placed on following up with the discharge plan when they leave the hospital in order to continue becoming healthier and happier.  Therapeutic Goals: 1)  demonstrate the importance of adding supports  2)  discuss 4 definitions of support  3)  identify the patient's current level of healthy support and   4)  elicit commitments to add one healthy support   Summary of Patient Progress:  The patient expressed full comprehension of the concepts presented, and agreed that there is a need to add more supports.  The patient indicated one healthy support that could be helpful if added would be contact with his friends and going to more deaf events.  He said the deaf community in this area is quite large and he would benefit from more socializing, going to events, going to church, and such.    Therapeutic Modalities:   Motivational Interviewing Brief Solution-Focused Therapy  [name]

## 2021-05-10 NOTE — Progress Notes (Signed)
Pt did not attend group. 

## 2021-05-10 NOTE — Progress Notes (Signed)
[facility] Group Notes:  (Nursing/MHT/Case Management/Adjunct)  Date:  05/10/2021  Time:  2000  Type of Therapy:   wrap up group  Participation Level:  Active  Participation Quality:  Appropriate, Attentive, Sharing, and Supportive  Affect:  Flat  Cognitive:  Appropriate  Insight:  Improving  Engagement in Group:  Engaged  Modes of Intervention:  Clarification, Education, and Support  Summary of Progress/Problems: Positive thinking and positive change were discussed. Pt had an interpreter signing during group time. Pt reports finding a medication that is working for him. Plans on volunteering for and giving back to the deaf community. Pt also training to be certified in sign language interpretation. Pt is grateful for programs and support like this hospital.   Marcille Buffy 05/10/2021, 8:48 PM

## 2021-05-10 NOTE — Progress Notes (Signed)
Pt is alert and oriented to person, place, time and situation. Pt is with an interpreter during nursing shift assessment, and interpreter is present in the event pt requires use of interpreter during shift. Pt is calm, cooperative, affect is flat, eye contact is poor, denies SI/HI/AVH, denies anxiety, reports a reduction in feelings of depression since admission, rates feelings of depression 4/10 on a 0-10 scale, 10 being worst. Pt reports he feels like being hospitalized and his mediation have helped improve his mood. Will continue to monitor pt per Q15 minute face checks and monitor for safety and progress.

## 2021-05-10 NOTE — [facility] Group Notes (Signed)
[facility] Group Notes:  (Nursing/MHT/Case Management/Adjunct)  Date:  05/10/2021  Time:  2:41 PM  Type of Therapy:  Psychoeducational Skills  Participation Level:  Active  Participation Quality:  Appropriate  Affect:  Appropriate  Cognitive:  Appropriate  Insight:  Appropriate  Engagement in Group:  Engaged  Modes of Intervention:  Education  Summary of Progress/Problems:Pt was engaged in group.  Jaquita Rector 05/10/2021, 2:41 PM

## 2021-05-10 NOTE — Progress Notes (Signed)
Exam: Physical Exam Vitals and nursing note reviewed.  HENT:     Head: Normocephalic.     Ears:     Comments: Deafness    Nose: Nose normal.  Cardiovascular:     Rate and Rhythm: Normal rate.     Pulses: Normal pulses.  Pulmonary:     Effort: Pulmonary effort is normal.  Genitourinary:    Comments: Hx. BPH Musculoskeletal:        General: Normal range of motion.     Cervical back: Normal range of motion.  Skin:    General: Skin is warm and dry.  Neurological:     General: No focal deficit present.     Mental Status: He is alert and oriented to person, place, and time.   Review of Systems  Constitutional:  Negative for chills, diaphoresis and fever.  HENT:  Positive for hearing loss. Negative for congestion and sore throat.   Eyes:  Negative for blurred vision.  Respiratory:  Negative for cough, shortness of breath and wheezing.   Cardiovascular:  Negative for chest pain and palpitations.  Gastrointestinal:  Negative for abdominal pain, blood in stool, constipation, diarrhea, heartburn, melena, nausea and vomiting.  Genitourinary:  Negative for dysuria.  Musculoskeletal:  Negative for joint pain and myalgias.  Skin: Negative.   Neurological:  Negative for dizziness, tingling, tremors, sensory change, speech change, focal weakness, seizures, loss of consciousness, weakness and headaches.  Endo/Heme/Allergies:  Negative for environmental allergies and polydipsia. Does not bruise/bleed easily.       Allergies: NKDA  Psychiatric/Behavioral:  Positive for depression. Negative for hallucinations (Hx. VH), memory loss, substance abuse and suicidal ideas. The patient is nervous/anxious. The  patient does not have insomnia.   Blood pressure 132/80, pulse 95, temperature 97.7 F (36.5 C), temperature source Oral, resp. rate 16, height 6\' 4"  (1.93 m), weight 81.6 kg, SpO2 99 %. Body mass index is 21.91 kg/m.  Treatment Plan Summary: Daily contact with patient to assess and evaluate symptoms and progress in treatment and Medication management  Continue inpatient hospitalization.  Will continue today 05/10/2021 plan as below except where it is noted.   Anxiety. Continue Vistaril 25 mg po tid prn. Continue gabapentin 100 mg po tid. . Depression. Continue Lexapro from 20 mg to 10 mg po daily.   Mood control. Continue Olanzapine from 10 mg to 15 po Q hs. Initiated: Tegretol 200 mg po daily for mood stabilization/TBI.   Agitation protocol.  Continue as recommended (see MAR).                  Insomnia. Continue Trazodone 50 mg po Q hs prn.   Other medical issues: continue'  Augment 875-125 mg bid for infection (uti). Biktarvy 50-200-25 mg po daily for HIV. Omega -3 1 tablet daily for vitamin supplementation. Flomax 0.4 mg po daily for BPH.  Vitamin D3 1 tablet daily for bone health. Mylanta 30 ml po Q 4 hrs prn for indigestion. MOM 30 ml po daily prn for constipation. Acetaminophen 650 mg po Q 4 hrs prn for pain/fever. Dulcolax 10 mg po once for constipation. Miralax 17 g po daily for constipation.  Encourage group participation.  Discharge disposition plan is ongoing.             Armandina StammerAgnes Joangel Vanosdol, NP, pmhnp, fnp-bc. 05/10/2021, 5:02 PM Patient ID: Tyler PihSteven Horn, male   DOB: 1961-05-23, 60 y.o.   MRN: [id number] Patient ID: Tyler PihSteven Horn, male   DOB: 1961-05-23, 60 y.o.   MRN: [id number]  Patient ID: Tyler Horn, male   DOB: September 30, 1960, 60 y.o.   MRN: [id number]  Exam: Physical Exam Vitals and nursing note reviewed.  HENT:     Head: Normocephalic.     Ears:     Comments: Deafness    Nose: Nose normal.  Cardiovascular:     Rate and Rhythm: Normal rate.     Pulses: Normal pulses.  Pulmonary:     Effort: Pulmonary effort is normal.  Genitourinary:    Comments: Hx. BPH Musculoskeletal:        General: Normal range of motion.     Cervical back: Normal range of motion.  Skin:    General: Skin is warm and dry.  Neurological:     General: No focal deficit present.     Mental Status: He is alert and oriented to person, place, and time.   Review of Systems  Constitutional:  Negative for chills, diaphoresis and fever.  HENT:  Positive for hearing loss. Negative for congestion and sore throat.   Eyes:  Negative for blurred vision.  Respiratory:  Negative for cough, shortness of breath and wheezing.   Cardiovascular:  Negative for chest pain and palpitations.  Gastrointestinal:  Negative for abdominal pain, blood in stool, constipation, diarrhea, heartburn, melena, nausea and vomiting.  Genitourinary:  Negative for dysuria.  Musculoskeletal:  Negative for joint pain and myalgias.  Skin: Negative.   Neurological:  Negative for dizziness, tingling, tremors, sensory change, speech change, focal weakness, seizures, loss of consciousness, weakness and headaches.  Endo/Heme/Allergies:  Negative for environmental allergies and polydipsia. Does not bruise/bleed easily.       Allergies: NKDA  Psychiatric/Behavioral:  Positive for depression. Negative for hallucinations (Hx. VH), memory loss, substance abuse and suicidal ideas. The patient is nervous/anxious. The  patient does not have insomnia.   Blood pressure 132/80, pulse 95, temperature 97.7 F (36.5 C), temperature source Oral, resp. rate 16, height 6\' 4"  (1.93 m), weight 81.6 kg, SpO2 99 %. Body mass index is 21.91 kg/m.  Treatment Plan Summary: Daily contact with patient to assess and evaluate symptoms and progress in treatment and Medication management  Continue inpatient hospitalization.  Will continue today 05/10/2021 plan as below except where it is noted.   Anxiety. Continue Vistaril 25 mg po tid prn. Continue gabapentin 100 mg po tid. . Depression. Continue Lexapro from 20 mg to 10 mg po daily.   Mood control. Continue Olanzapine from 10 mg to 15 po Q hs. Initiated: Tegretol 200 mg po daily for mood stabilization/TBI.   Agitation protocol.  Continue as recommended (see MAR).                  Insomnia. Continue Trazodone 50 mg po Q hs prn.   Other medical issues: continue'  Augment 875-125 mg bid for infection (uti). Biktarvy 50-200-25 mg po daily for HIV. Omega -3 1 tablet daily for vitamin supplementation. Flomax 0.4 mg po daily for BPH.  Vitamin D3 1 tablet daily for bone health. Mylanta 30 ml po Q 4 hrs prn for indigestion. MOM 30 ml po daily prn for constipation. Acetaminophen 650 mg po Q 4 hrs prn for pain/fever. Dulcolax 10 mg po once for constipation. Miralax 17 g po daily for constipation.  Encourage group participation.  Discharge disposition plan is ongoing.             Armandina StammerAgnes Joangel Vanosdol, NP, pmhnp, fnp-bc. 05/10/2021, 5:02 PM Patient ID: Tyler PihSteven Horn, male   DOB: 1961-05-23, 60 y.o.   MRN: [id number] Patient ID: Tyler PihSteven Horn, male   DOB: 1961-05-23, 60 y.o.   MRN: [id number]  Patient ID: Tyler Horn, male   DOB: September 30, 1960, 60 y.o.   MRN: [id number]  Patient ID: Tyler Horn, male   DOB: September 30, 1960, 60 y.o.   MRN: [id number]

## 2021-05-10 NOTE — Progress Notes (Signed)
Patient did not attend wrap up group. 

## 2021-05-11 ENCOUNTER — Encounter (HOSPITAL_COMMUNITY): Payer: Self-pay

## 2021-05-11 DIAGNOSIS — F333 Major depressive disorder, recurrent, severe with psychotic symptoms: Principal | ICD-10-CM

## 2021-05-11 MED ORDER — ENSURE ENLIVE PO LIQD
237.0000 mL | Freq: Two times a day (BID) | ORAL | Status: DC
  Administered 2021-05-11 – 2021-05-12 (×2): 237 mL via ORAL
  Filled 2021-05-11 (×5): qty 237

## 2021-05-11 NOTE — Progress Notes (Signed)
Patient did not attend the evening speaker AA meeting.  

## 2021-05-11 NOTE — Progress Notes (Signed)
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  Objective: Medical record reviewed.  Patient's case discussed in detail with members of the treatment team and nursing staff.  I met with and evaluated the patient on the unit today for follow-up.  This is my first visit with patient.  He was previously followed by Dr. Barrington Ellison.  Communication during the interview is assisted via [place] sign language interpreter [phone] present via interpreter app.  Patient reports that he is much improved from admission.  Patient tells me that prior to admission he was feeling sad and anxious, wanted to hurt himself and was hearing and seeing things.  Patient states that his symptoms are now much improved with medication changes made during current admission.  He denies depressed mood, anhedonia, passive wish for death, SI, AI or HI.  Patient states that he is no longer experiencing AH or VH and that these have been gone for several days.  He reports that his symptoms resolved after he had been taking his current medications for about 3 days in the hospital.  States that he feels ready to leave the hospital and has resources outside the hospital for outpatient treatment and for social support.  He denies ever attempting suicide prior to the current admission.  He does report prior attempt by taking pills in February 2022.  Patient tells me he has not experienced thoughts of suicide since last Thursday.  He has been eating and sleeping well.  Patient says that he had a small bowel movement this morning but previously was constipated.  He denies other medication side  effects or physical problems other than mild nausea today which she states has improved.  Patient says he is looking forward to going to the park, going for walks and attending deaf events he has discharge.  He also plans to look for a part-time job.  I encouraged him to work on structuring his time to stay busy and also to have more contact with people outside his home to reduce isolation.  Patient communicated understanding and agreement with this recommendation.  I advised him that carbamazepine which was initiated yesterday may interact with his HIV medication and I would like to discontinue it.  Patient is receptive to discontinuing carbamazepine.  Staff document the patient slept 6.75 hours last night.  Patient is afebrile with stable vital signs within normal limits today.  No new labs today.  I have reviewed available lab results from earlier during the current admission.  Staff document that patient has been attending most groups and participating with the assistance of sign language interpretation.  Principal Problem: Major depressive disorder, recurrent, severe with psychotic features ([place]) Diagnosis: Principal Problem:   Major depressive disorder, recurrent, severe with psychotic features ([place]) Active Problems:   Generalized anxiety disorder  Total Time spent with patient:  25 minutes  Past Psychiatric History: See admission H&P  Past Medical History:  Past Medical History:  Diagnosis Date   Anxiety    Chronic hepatitis C ([place]) 04/27/2020   Depression    HIV (human immunodeficiency virus infection) ([place])    HIV (human immunodeficiency virus infection) ([place])     Past Surgical  History:  Procedure Laterality Date   CATARACT EXTRACTION Left    Family History:  Family History  Problem Relation Age of Onset   Mental illness Mother    Bipolar disorder Brother    Family Psychiatric  History: See admission H&P Social History:  Social History   Substance and Sexual Activity   Alcohol Use No     Social History   Substance and Sexual Activity  Drug Use No    Social History   Socioeconomic History   Marital status: Single    Spouse name: Not on file   Number of children: Not on file   Years of education: Not on file   Highest education level: Not on file  Occupational History   Not on file  Tobacco Use   Smoking status: Never   Smokeless tobacco: Never  Vaping Use   Vaping Use: Never used  Substance and Sexual Activity   Alcohol use: No   Drug use: No   Sexual activity: Yes    Comment: pt given condoms  Other Topics Concern   Not on file  Social History Narrative   Not on file   Social Determinants of Health   Financial Resource Strain: Not on file  Food Insecurity: Not on file  Transportation Needs: Not on file  Physical Activity: Not on file  Stress: Not on file  Social Connections: Not on file   Additional Social History:                         Sleep: Good  Appetite:  Fair  Current Medications: Current Facility-Administered Medications  Medication Dose Route Frequency Provider Last Rate Last Admin   acetaminophen (TYLENOL) tablet 650 mg  650 mg Oral Q6H PRN Chalmers Guest, NP       alum & mag hydroxide-simeth (MAALOX/MYLANTA) 200-200-20 MG/5ML suspension 30 mL  30 mL Oral Q4H PRN Chalmers Guest, NP       amoxicillin-clavulanate (AUGMENTIN) 875-125 MG per tablet 1 tablet  1 tablet Oral Q12H Chalmers Guest, NP   1 tablet at 05/11/21 0814   bictegravir-emtricitabine-tenofovir AF (BIKTARVY) 50-200-25 MG per tablet 1 tablet  1 tablet Oral Daily Margorie John W, PA-C   1 tablet at 05/11/21 0813   escitalopram (LEXAPRO) tablet 20 mg  20 mg Oral Daily Chalmers Guest, NP   20 mg at 05/11/21 0814   feeding supplement (ENSURE ENLIVE / ENSURE PLUS) liquid 237 mL  237 mL Oral BID BM Ethelene Hal, NP   237 mL at 05/11/21 1404   gabapentin (NEURONTIN) capsule 100 mg  100 mg Oral TID Lindell Spar I, NP   100 mg at 05/11/21  1202   hydrOXYzine (ATARAX/VISTARIL) tablet 25 mg  25 mg Oral TID PRN Chalmers Guest, NP   25 mg at 05/10/21 2105   magnesium hydroxide (MILK OF MAGNESIA) suspension 30 mL  30 mL Oral Daily PRN Chalmers Guest, NP   30 mL at 05/10/21 0617   OLANZapine zydis (ZYPREXA) disintegrating tablet 15 mg  15 mg Oral QHS Lindell Spar I, NP   15 mg at 05/10/21 2107   OLANZapine zydis (ZYPREXA) disintegrating tablet 5 mg  5 mg Oral Q8H PRN Chalmers Guest, NP   5 mg at 05/08/21 9201   And   ziprasidone (GEODON) injection 20 mg  20 mg Intramuscular PRN Chalmers Guest, NP       omega-3 acid ethyl esters (LOVAZA)  capsule 1 g  1 g Oral Daily Margorie John W, PA-C   1 g at 05/11/21 0816   polyethylene glycol (MIRALAX / GLYCOLAX) packet 17 g  17 g Oral Daily Lindell Spar I, NP   17 g at 05/11/21 0815   tamsulosin (FLOMAX) capsule 0.4 mg  0.4 mg Oral Daily Margorie John W, PA-C   0.4 mg at 05/11/21 2542   traZODone (DESYREL) tablet 50 mg  50 mg Oral QHS PRN Chalmers Guest, NP   50 mg at 05/10/21 2105   Vitamin D3 (Vitamin D) tablet 1,000 Units  1,000 Units Oral Daily Prescilla Sours, PA-C   1,000 Units at 05/11/21 7062    Lab Results: No results found for this or any previous visit (from the past 48 hour(s)).  Blood Alcohol level:  Lab Results  Component Value Date   ETH <10 05/05/2021   ETH <10 37/62/8315    Metabolic Disorder Labs: Lab Results  Component Value Date   HGBA1C 5.0 05/06/2021   MPG 96.8 05/06/2021   MPG 99.67 10/18/2020   No results found for: PROLACTIN Lab Results  Component Value Date   CHOL 138 05/06/2021   TRIG 77 05/06/2021   HDL 33 (L) 05/06/2021   CHOLHDL 4.2 05/06/2021   VLDL 15 05/06/2021   LDLCALC 90 05/06/2021   LDLCALC 93 10/18/2020    Physical Findings: AIMS: Facial and Oral Movements Muscles of Facial Expression: None, normal Lips and Perioral Area: Minimal (when communicating) Jaw: None, normal Tongue: None, normal,Extremity Movements Upper (arms, wrists, hands,  fingers): None, normal Lower (legs, knees, ankles, toes): None, normal, Trunk Movements Neck, shoulders, hips: None, normal, Overall Severity Severity of abnormal movements (highest score from questions above): Minimal Incapacitation due to abnormal movements: None, normal Patient's awareness of abnormal movements (rate only patient's report): Aware, no distress, Dental Status Current problems with teeth and/or dentures?: No Does patient usually wear dentures?: No  CIWA:    COWS:     Musculoskeletal: Strength & Muscle Tone: within normal limits Gait & Station: normal Patient leans: N/A  Psychiatric Specialty Exam:  Presentation  General Appearance: Appropriate for Environment; Fairly Groomed  Eye Contact:Good  Speech:Other (comment) (communicates via Arts administrator)  Speech Volume:Other (comment) (communicates via American Sign Language)  Handedness:Ambidextrous   Mood and Affect  Mood:Euthymic  Affect:Constricted   Thought Process  Thought Processes:Coherent; Goal Directed  Descriptions of Associations:Intact  Orientation:Full (Time, Place and Person)  Thought Content:Logical  History of Schizophrenia/Schizoaffective disorder:No  Duration of Psychotic Symptoms:Less than six months  Hallucinations:Hallucinations: None Description of Visual Hallucinations: NA  Ideas of Reference:None  Suicidal Thoughts:Suicidal Thoughts: No SI Active Intent and/or Plan: Without Intent; Without Plan; Without Means to Carry Out; Without Access to Means SI Passive Intent and/or Plan: Without Plan; Without Intent; Without Access to Means; Without Means to Carry Out  Homicidal Thoughts:Homicidal Thoughts: No   Sensorium  Memory:Immediate Good; Recent Good; Remote Good  Judgment:Fair  Insight:Fair   Executive Functions  Concentration:Fair  Attention Span:Fair  [place] of Knowledge:Good  Language:Other (comment); Good (as determined by how pt  communicates via [employer] Language)   Psychomotor Activity  Psychomotor Activity:Psychomotor Activity: Normal   Assets  Assets:Communication Skills; Desire for Improvement; Housing; Physical Health; Social Support   Sleep  Sleep:Sleep: Good Number of Hours of Sleep: 6.75    Physical Exam: Physical Exam Vitals and nursing note reviewed.  Constitutional:      General: He is not in acute distress.  Appearance: Normal appearance. He is not diaphoretic.  HENT:     Head: Normocephalic and atraumatic.  Cardiovascular:     Rate and Rhythm: Normal rate.  Pulmonary:     Effort: Pulmonary effort is normal.  Neurological:     General: No focal deficit present.     Mental Status: He is alert and oriented to person, place, and time.   Review of Systems  Constitutional:  Negative for chills, diaphoresis and fever.  HENT:  Positive for hearing loss.        Patient has no hearing in either ear; communicates via American sign language  Respiratory:  Negative for cough and shortness of breath.   Cardiovascular:  Negative for chest pain and palpitations.  Gastrointestinal:  Positive for constipation and nausea. Negative for diarrhea and vomiting.  Genitourinary:  Negative for dysuria.  Musculoskeletal:  Negative for myalgias.  Neurological:  Negative for dizziness, tremors and seizures.  Psychiatric/Behavioral:  Negative for hallucinations and suicidal ideas. The patient does not have insomnia.   All other systems reviewed and are negative. Blood pressure 136/76, pulse 88, temperature 97.6 F (36.4 C), temperature source Oral, resp. rate 18, height 6' 4"  (1.93 m), weight 81.6 kg, SpO2 98 %. Body mass index is 21.91 kg/m.   Treatment Plan Summary: Daily contact with patient to assess and evaluate symptoms and progress in treatment and Medication management  Major depressive disorder, recurrent with psychotic features -Continue Lexapro 20 mg daily for depression and  anxiety -Continue olanzapine 15 mg nightly for adjunctive treatment of mood and treatment of psychotic symptoms -Discontinue Tegretol due to its potential interference with metabolism of Biktarvy could lead to the development of resistance  Generalized anxiety disorder -Continue Lexapro 20 mg daily for depression and anxiety -Continue hydroxyzine 25 mg TID PRN -Continue gabapentin 100 mg TID  Insomnia -Continue trazodone 50 mg nightly PRN  HIV -Continue Biktarvy 50-200-25 mg daily  UTI -Continue Augmentin 875-125 mg BID x 7 days (last dose on AM 05/12/21)  BPH -Continue Flomax 0.4 mg daily  Constipation continue MiraLAX 17 g daily Continue MOM 30 mL daily PRN  Discharge planning in progress.  Arthor Captain, MD 05/11/2021, 3:42 PM

## 2021-05-11 NOTE — Plan of Care (Signed)
Nurse discussed anxiety, depression and coping skills with patient.  

## 2021-05-11 NOTE — [facility] Group Notes (Signed)
Tyler Horn did not attend group this morning, due to sharing that he wasn't feeling well.   The focus of this group is to educate the patient on the purpose and policies of crisis stabilization and provide a format to answer questions about their admission.  The group details unit policies and expectations of patients while admitted.

## 2021-05-11 NOTE — BH IP Treatment Plan (Signed)
Interdisciplinary Treatment and Diagnostic Plan Update  05/11/2021 Time of Session: 9:15am Remon Quinto MRN: [id number]  Principal Diagnosis: Major depressive disorder, recurrent, severe with psychotic features ([place])  Secondary Diagnoses: Principal Problem:   Major depressive disorder, recurrent, severe with psychotic features ([place]) Active Problems:   Suicidal thoughts   Benzodiazepine misuse   Generalized anxiety disorder   MDD (major depressive disorder), recurrent severe, without psychosis ([place])   Current Medications:  Current Facility-Administered Medications  Medication Dose Route Frequency Provider Last Rate Last Admin   acetaminophen (TYLENOL) tablet 650 mg  650 mg Oral Q6H PRN Chalmers Guest, NP       alum & mag hydroxide-simeth (MAALOX/MYLANTA) 200-200-20 MG/5ML suspension 30 mL  30 mL Oral Q4H PRN Chalmers Guest, NP       amoxicillin-clavulanate (AUGMENTIN) 875-125 MG per tablet 1 tablet  1 tablet Oral Q12H Chalmers Guest, NP   1 tablet at 05/11/21 0814   bictegravir-emtricitabine-tenofovir AF (BIKTARVY) 50-200-25 MG per tablet 1 tablet  1 tablet Oral Daily Margorie John W, PA-C   1 tablet at 05/11/21 0813   carbamazepine (TEGRETOL) tablet 200 mg  200 mg Oral Daily Lindell Spar I, NP   200 mg at 05/11/21 0813   escitalopram (LEXAPRO) tablet 20 mg  20 mg Oral Daily Chalmers Guest, NP   20 mg at 05/11/21 9233   gabapentin (NEURONTIN) capsule 100 mg  100 mg Oral TID Lindell Spar I, NP   100 mg at 05/11/21 0815   hydrOXYzine (ATARAX/VISTARIL) tablet 25 mg  25 mg Oral TID PRN Chalmers Guest, NP   25 mg at 05/10/21 2105   magnesium hydroxide (MILK OF MAGNESIA) suspension 30 mL  30 mL Oral Daily PRN Chalmers Guest, NP   30 mL at 05/10/21 0617   OLANZapine zydis (ZYPREXA) disintegrating tablet 15 mg  15 mg Oral QHS [place], Herbert Pun I, NP   15 mg at 05/10/21 2107   OLANZapine zydis (ZYPREXA) disintegrating tablet 5 mg  5 mg Oral Q8H PRN Chalmers Guest, NP   5 mg at 05/08/21 0076   And    ziprasidone (GEODON) injection 20 mg  20 mg Intramuscular PRN Chalmers Guest, NP       omega-3 acid ethyl esters (LOVAZA) capsule 1 g  1 g Oral Daily Margorie John W, PA-C   1 g at 05/11/21 0816   polyethylene glycol (MIRALAX / GLYCOLAX) packet 17 g  17 g Oral Daily Lindell Spar I, NP   17 g at 05/11/21 0815   tamsulosin (FLOMAX) capsule 0.4 mg  0.4 mg Oral Daily Margorie John W, PA-C   0.4 mg at 05/11/21 2263   traZODone (DESYREL) tablet 50 mg  50 mg Oral QHS PRN Chalmers Guest, NP   50 mg at 05/10/21 2105   Vitamin D3 (Vitamin D) tablet 1,000 Units  1,000 Units Oral Daily Prescilla Sours, PA-C   1,000 Units at 05/11/21 3354   PTA Medications: Medications Prior to Admission  Medication Sig Dispense Refill Last Dose   acetaminophen (TYLENOL) 500 MG tablet Take 1,000 mg by mouth every 6 (six) hours as needed for mild pain.      bictegravir-emtricitabine-tenofovir AF (BIKTARVY) 50-200-25 MG TABS tablet Take 1 tablet by mouth daily. 30 tablet 5    cholecalciferol (VITAMIN D3) 25 MCG (1000 UNIT) tablet Take 1,000 Units by mouth daily.      escitalopram (LEXAPRO) 20 MG tablet Take 2 tablets (40 mg total) by mouth daily.  60 tablet 1    hydrOXYzine (ATARAX/VISTARIL) 25 MG tablet Take 1 tablet (25 mg total) by mouth every 6 (six) hours as needed for anxiety. 16 tablet 0    OLANZapine (ZYPREXA) 10 MG tablet Take 1 tablet (10 mg total) by mouth at bedtime. 30 tablet 1    Omega-3 Fatty Acids (FISH OIL) 1000 MG CPDR Take 1,000 mg by mouth daily.      QUEtiapine (SEROQUEL) 200 MG tablet Take 200 mg by mouth at bedtime.      tamsulosin (FLOMAX) 0.4 MG CAPS capsule Take 0.4 mg by mouth daily.       Patient Stressors: Health problems   Medication change or noncompliance     Patient Strengths: Ability for insight  Capable of independent living  Communication skills  Motivation for treatment/growth  Physical Health  Supportive family/friends    Treatment Modalities: Medication Management, Group therapy,  Case management,  1 to 1 session with clinician, Psychoeducation, Recreational therapy.     Physician Treatment Plan for Primary Diagnosis: MDD (major depressive disorder), recurrent severe, without psychosis ([place]) Long Term Goal(s): Improvement in symptoms so as ready for discharge    Short Term Goals: Ability to demonstrate self-control will improve Ability to identify and develop effective coping behaviors will improve Ability to maintain clinical measurements within normal limits will improve Compliance with prescribed medications will improve Ability to identify changes in lifestyle to reduce recurrence of condition will improve Ability to verbalize feelings will improve Ability to disclose and discuss suicidal ideas   Medication Management: Evaluate patient's response, side effects, and tolerance of medication regimen.   Therapeutic Interventions: 1 to 1 sessions, Unit Group sessions and Medication administration.   Evaluation of Outcomes: Not Met   Physician Treatment Plan for Secondary Diagnosis: Principal Problem:   MDD (major depressive disorder), recurrent severe, without psychosis ([place]) Active Problems:   Suicidal thoughts   Benzodiazepine misuse   Generalized anxiety disorder   Long Term Goal(s): Improvement in symptoms so as ready for discharge    Short Term Goals: Ability to demonstrate self-control will improve Ability to identify and develop effective coping behaviors will improve Ability to maintain clinical measurements within normal limits will improve Compliance with prescribed medications will improve Ability to identify changes in lifestyle to reduce recurrence of condition will improve Ability to verbalize feelings will improve Ability to disclose and discuss suicidal ideas      Medication Management: Evaluate patient's response, side effects, and tolerance of medication regimen.   Therapeutic Interventions: 1 to 1 sessions, Unit Group sessions and  Medication administration.   Evaluation of Outcomes: Not Met     RN Treatment Plan for Primary Diagnosis: MDD (major depressive disorder), recurrent severe, without psychosis ([place]) Long Term Goal(s): Knowledge of disease and therapeutic regimen to maintain health will improve   Short Term Goals: Ability to remain free from injury will improve, Ability to participate in decision making will improve, Ability to verbalize feelings will improve, Ability to disclose and discuss suicidal ideas, and Ability to identify and develop effective coping behaviors will improve   Medication Management: RN will administer medications as ordered by provider, will assess and evaluate patient's response and provide education to patient for prescribed medication. RN will report any adverse and/or side effects to prescribing provider.   Therapeutic Interventions: 1 on 1 counseling sessions, Psychoeducation, Medication administration, Evaluate responses to treatment, Monitor vital signs and CBGs as ordered, Perform/monitor CIWA, COWS, AIMS and Fall Risk screenings as ordered, Perform wound care treatments  as ordered.   Evaluation of Outcomes: Not Met     LCSW Treatment Plan for Primary Diagnosis: MDD (major depressive disorder), recurrent severe, without psychosis ([place]) Long Term Goal(s): Safe transition to appropriate next level of care at discharge, Engage patient in therapeutic group addressing interpersonal concerns.   Short Term Goals: Engage patient in aftercare planning with referrals and resources, Increase social support, Increase emotional regulation, Facilitate acceptance of mental health diagnosis and concerns, Identify triggers associated with mental health/substance abuse issues, and Increase skills for wellness and recovery   Therapeutic Interventions: Assess for all discharge needs, 1 to 1 time with Social worker, Explore available resources and support systems, Assess for adequacy in community  support network, Educate family and significant other(s) on suicide prevention, Complete Psychosocial Assessment, Interpersonal group therapy.   Evaluation of Outcomes: Not Met     Progress in Treatment: Attending groups: Yes and no Participating in groups: No. Taking medication as prescribed: Yes. Toleration medication: Yes. Family/Significant other contact made: No, will contact:  [place]  Patient understands diagnosis: Yes. and No. Discussing patient identified problems/goals with staff: Yes. Medical problems stabilized or resolved: Yes. Denies suicidal/homicidal ideation: Yes. Issues/concerns per patient self-inventory: No.     New problem(s) identified: No, Describe:  None   New Short Term/Long Term Goal(s): medication stabilization, elimination of SI thoughts, development of comprehensive mental wellness plan.    Patient Goals: "To get better"    Discharge Plan or Barriers: Patient recently admitted. CSW will continue to follow and assess for appropriate referrals and possible discharge planning.    Reason for Continuation of Hospitalization: Anxiety Depression Hallucinations Medication stabilization    Estimated Length of Stay: 3 to 5 days   Scribe for Treatment Team: Vassie Moselle, LCSW 05/11/2021 10:19 AM

## 2021-05-11 NOTE — [facility] Group Notes (Addendum)
Type of Therapy/Topic: Group Therapy: Feelings about Diagnosis  Participation Level: Active  Description of Group: This group will allow patients to explore their thoughts and feelings about diagnoses they have received. Patients will be guided to explore their level of understanding and acceptance of these diagnoses. Facilitator will encourage patients to process their thoughts and feelings about the reactions of others to their diagnosis and will guide patients in identifying ways to discuss their diagnosis with significant others in their lives. This group will be process-oriented, with patients participating in exploration of their own experiences, giving and receiving support, and processing challenge from other group members.  Therapeutic Goals: 1. Patient will demonstrate understanding of diagnosis as evidenced by identifying two or more symptoms of the disorder 2. Patient will be able to express two feelings regarding the diagnosis 3. Patient will demonstrate their ability to communicate their needs through discussion and/or role play  Summary of Patient Progress: The Pt reports that they often feel that they are learning to accept their diagnosis and ignore other people's perceptions.  The Pt accepted the worksheet and participated in the group discussion with their peers. The Pt showed understanding of what their diagnosis is and a willingness to explore more information about ways to improve their future around that diagnosis.

## 2021-05-11 NOTE — Group Note (Signed)
Recreation Therapy Group Note   Group Topic:Stress Management  Group Date: 05/11/2021 Start Time: 0930 End Time: 0945 Facilitators: Caroll Rancher, LRT/CTRS Location: [address] Dayroom  Goal Area(s) Addresses:  Patient will actively participate in stress management techniques presented during session.  Patient will successfully identify benefit of practicing stress management post d/c.    Group Description: Guided Imagery. LRT provided education, instruction, and demonstration on practice of visualization via guided imagery. Patient was asked to participate in the technique introduced during session. LRT debriefed including topics of mindfulness, stress management and specific scenarios each patient could use these techniques. Patients were given suggestions of ways to access scripts post d/c and encouraged to explore Youtube and other apps available on smartphones, tablets, and computers.   Affect/Mood: N/A   Participation Level: Did not attend    Clinical Observations/Individualized Feedback: Pt did not attend group session.    Plan: Continue to engage patient in RT group sessions 2-3x/week.   Caroll Rancher, LRT/CTRS 05/11/2021 11:27 AM

## 2021-05-11 NOTE — Progress Notes (Signed)
D:  Patient's self inventory sheet, patient sleeps good, sleep medication helpful.  Good appetite, normal energy level, good concentration.  Rated depression 2, hopeless 2, anxiety 1.  Denied withdrawals.  Denied SI.  Physical problems, constipation.  Goal is to get better.   A:  Medications administered per MD orders.  Emotional support and encouragement given patient. R:  Denied SI and HI.  Denied A/V hallucinations.  Safety maintained with 15 minute checks.

## 2021-05-11 NOTE — Progress Notes (Signed)
   05/10/21 2105  Psych Admission Type (Psych Patients Only)  Admission Status Voluntary  Psychosocial Assessment  Patient Complaints None  Eye Contact Fair  Facial Expression Pensive  Affect Appropriate to circumstance  Speech Other (Comment) ([place] patient is hard of hearing)  Interaction Assertive  Motor Activity Other (Comment) (WDL)  Appearance/Hygiene Unremarkable  Behavior Characteristics Cooperative;Appropriate to situation  Mood Depressed  Thought Process  Coherency WDL  Content WDL  Delusions None reported or observed  Perception WDL  Hallucination None reported or observed  Judgment Poor  Confusion None  Danger to Self  Current suicidal ideation? Denies  Self-Injurious Behavior No self-injurious ideation or behavior indicators observed or expressed   Agreement Not to Harm Self Yes  Description of Agreement Written contract  Danger to Others  Danger to Others None reported or observed

## 2021-05-11 NOTE — Progress Notes (Signed)
   05/11/21 2151  Psych Admission Type (Psych Patients Only)  Admission Status Voluntary  Psychosocial Assessment  Patient Complaints Anxiety;Depression  Eye Contact Fair  Facial Expression Pensive  Affect Appropriate to circumstance  Speech Other (Comment) ([place] hearing impaired)  Interaction Assertive  Motor Activity Other (Comment) (WDL)  Appearance/Hygiene Unremarkable  Behavior Characteristics Cooperative;Appropriate to situation  Mood Pleasant  Thought Process  Coherency WDL  Content WDL  Delusions None reported or observed  Perception WDL  Hallucination None reported or observed  Judgment Poor  Confusion None  Danger to Self  Current suicidal ideation? Denies  Self-Injurious Behavior No self-injurious ideation or behavior indicators observed or expressed   Agreement Not to Harm Self Yes  Description of Agreement Written Contract  Danger to Others  Danger to Others None reported or observed

## 2021-05-12 DIAGNOSIS — F411 Generalized anxiety disorder: Secondary | ICD-10-CM

## 2021-05-12 MED ORDER — OLANZAPINE 15 MG PO TBDP: 15.0000 mg | ORAL_TABLET | Freq: Every day | ORAL | 0 refills | Status: DC

## 2021-05-12 MED ORDER — GABAPENTIN 100 MG PO CAPS: 100.0000 mg | ORAL_CAPSULE | Freq: Three times a day (TID) | ORAL | 0 refills | Status: DC

## 2021-05-12 MED ORDER — ESCITALOPRAM OXALATE 20 MG PO TABS: 20.0000 mg | ORAL_TABLET | Freq: Every day | ORAL | 0 refills | Status: DC

## 2021-05-12 NOTE — Progress Notes (Signed)
  [facility] Adult Case Management Discharge Plan :  Will you be returning to the same living situation after discharge:  Yes,  patient will be dropped off to get his car and then going back to apartment At discharge, do you have transportation home?: No. Do you have the ability to pay for your medications: No. Patient provided follow up and resources for individuals who are uninsured to get services  Release of information consent forms completed and in the chart;  Patient's signature needed at discharge.  Patient to Follow up at:  Follow-up Information     [facility] Follow up.   Specialty: Behavioral Health Why: Please go to this provider for therapy and medication management services during walk in hours:  Monday through Thursday, from 7:45 am to 11:00 am.  Services will be provided on a first come, first served basis. Contact information: [address] [place] [address] [phone]        [facility]. Call.   Specialty: Hospice and Palliative Medicine Why: Please call to personally schedule an appointment for bereavement/grief counseling services. Contact information: [address] [facility] [place] [address] [phone]                Next level of care provider has access to [facility] Link:yes  Safety Planning and Suicide Prevention discussed: No. Patient denied consents, did safety planning with patient.      Has patient been referred to the Quitline?: N/A patient is not a smoker  Patient has been referred for addiction treatment: N/A  Mylea Roarty E Oneita Allmon, LCSW 05/12/2021, 10:21 AM

## 2021-05-12 NOTE — Discharge Summary (Addendum)
Physician Discharge Summary Note  Patient:  Tyler Horn is an 60 y.o., male MRN:  [id number] DOB:  [birth year] Patient phone:  [phone] (home)  Patient address:   [address] [place] [address],  Total Time spent with patient: 30 minutes  Date of Admission:  05/05/2021 Date of Discharge: 05/12/2021  Reason for Admission:  (From MD's admission note): This is one of several psychiatric admission assessments in this [facility] for this 60 year old Caucasian male with hx of Major depressive disorder. He has been a patient in this [facility] x numerous times with similar presentations. His last admission here at [facility] was in February of this year, 2022. He was here for 7 days at that time. He is being admitted to the [facility] this time around from the [facility] with complaint of worsening symptoms of depression & suicidal ideations of 1 week with plans to cut himself with a kitchen knife. He is admitted for mood stabilization treatments. This patient is deaf, communicates using sign language. This assessment was conducted using a sign language interpreter Photographer). During this assessment Tyler Horn reports via the language interpreter,  "I'm not stable. My medications are off, not working. I got very depressed & anxious because I'm lonely, no support system & since my retirement, I do not have any social life. I feel stressed, depressed & anxious because of my loneliness. So, I recently saw my outpatient psychiatric provider. I also have a therapist that I talk to from time to time. They suggested that I have been on Seroquel for a long time. They think it may no longer be effective. The Seroquel was stopped & Olanzapine was started. It has been a week, I'm still feeling the same way. It could equally be that I'm a little inpatient. I have also been seeing things, not hearing voices, just seeing spirits, bugs crawling. I need the medicines that will help with my mental health & the seeing things,  then, I can deal with the loneliness. I have been feeling very anxious as well. It has been over a week, not getting any better". Objective: During this admission evaluation, Tyler Horn was noted to be very restless, anxious & fidgety. We had time to discuss his treatment plan & he is in agreement to continue his regimen as already in progress..    Principal Problem: Major depressive disorder, recurrent, severe with psychotic features [facility]) Discharge Diagnoses: Principal Problem:   Major depressive disorder, recurrent, severe with psychotic features (HCC) Active Problems:   Generalized anxiety disorder   Past Psychiatric History: Major depressive disorder with psychosis.  Past Medical History:  Past Medical History:  Diagnosis Date   Anxiety    Chronic hepatitis C (HCC) 04/27/2020   Depression    HIV (human immunodeficiency virus infection) (HCC)    HIV (human immunodeficiency virus infection) (HCC)     Past Surgical History:  Procedure Laterality Date   CATARACT EXTRACTION Left    Family History:  Family History  Problem Relation Age of Onset   Mental illness Mother    Bipolar disorder Brother    Family Psychiatric  History: Denies knowing of any family history Social History:  Social History   Substance and Sexual Activity  Alcohol Use No     Social History   Substance and Sexual Activity  Drug Use No    Social History   Socioeconomic History   Marital status: Single    Spouse name: Not on file   Number of children:  Not on file   Years of education: Not on file   Highest education level: Not on file  Occupational History   Not on file  Tobacco Use   Smoking status: Never   Smokeless tobacco: Never  Vaping Use   Vaping Use: Never used  Substance and Sexual Activity   Alcohol use: No   Drug use: No   Sexual activity: Yes    Comment: pt given condoms  Other Topics Concern   Not on file  Social History Narrative   Not on file   Social Determinants of  Health   Financial Resource Strain: Not on file  Food Insecurity: Not on file  Transportation Needs: Not on file  Physical Activity: Not on file  Stress: Not on file  Social Connections: Not on file    Hospital Course:  After the above admission evaluation, Tyler Horn's presenting symptoms were noted. He was recommended for mood stabilization treatments. The medication regimen targeting those presenting symptoms were discussed with him & initiated with his consent. He was restarted on his home medications, with the following changes, his Zyprexa was increased for 10 mg to 15 mg and he was started on Gabapentin 100 mg three times a day. His UDS on arrival to the ED was positive for benzodiazepines, BAL was negative.He was however medicated, stabilized & discharged on the medications as listed on his discharge medication list below. Besides the mood stabilization treatments, Tyler Horn was also enrolled & participated in the group counseling sessions being offered & held on this unit. He learned coping skills. He presented no other significant pre-existing medical issues that required treatment. His medical home medications were continued during his hospitalization. He was started on Augmentin for a UTI, last dose was given on day of discharge. He tolerated his treatment regimen without any adverse effects or reactions reported.   During the course of his hospitalization, the 15-minute checks were adequate to ensure patient's safety. Tyler Horn did not display any dangerous, violent or suicidal behavior on the unit.  He interacted with patients & staff appropriately, participated appropriately in the group sessions/therapies. His medications were addressed & adjusted to meet his needs. He was recommended for outpatient follow-up care & medication management upon discharge to assure continuity of care & mood stability.  At the time of discharge patient is not reporting any acute suicidal/homicidal ideations. He feels  more confident about his self-care & in managing his mental health. He currently denies any new issues or concerns. Education and supportive counseling provided throughout his hospital stay & upon discharge.   Today upon his discharge evaluation with the attending psychiatrist, Tyler Horn shares he is doing well and feels ready to discharge home. He denies any other specific concerns. He is sleeping well. His appetite is good. He denies other physical complaints. He denies AH/VH, delusional thoughts or paranoia. He does not appear to be responding to any internal stimuli. He feels that his medications have been helpful & is in agreement to continue his current treatment regimen as recommended. He was able to engage in safety planning including plan to return to [facility] or contact emergency services if he feels unable to maintain his own safety or the safety of others. Pt had no further questions, comments, or concerns. He left [facility] with all personal belongings in no apparent distress. Transportation per [employer] to the hospital parking lot to retrieve his car.     Physical Findings: AIMS: Facial and Oral Movements Muscles of Facial Expression: None, normal Lips  and Perioral Area: Minimal (when communicating) Jaw: None, normal Tongue: None, normal,Extremity Movements Upper (arms, wrists, hands, fingers): None, normal Lower (legs, knees, ankles, toes): None, normal, Trunk Movements Neck, shoulders, hips: None, normal, Overall Severity Severity of abnormal movements (highest score from questions above): Minimal Incapacitation due to abnormal movements: None, normal Patient's awareness of abnormal movements (rate only patient's report): Aware, no distress, Dental Status Current problems with teeth and/or dentures?: No Does patient usually wear dentures?: No  CIWA:    COWS:     Musculoskeletal: Strength & Muscle Tone: within normal limits Gait & Station: normal Patient leans: N/A  Psychiatric  Specialty Exam:  Presentation  General Appearance: Appropriate for Environment  Eye Contact:Good  Speech:Other (comment) (Communicates via American sign language)  Speech Volume:Other (comment) (Communicates via American sign language)  Handedness:Ambidextrous  Mood and Affect  Mood:Euthymic  Affect:Congruent  Thought Process  Thought Processes:Coherent; Goal Directed  Descriptions of Associations:Intact  Orientation:Full (Time, Place and Person)  Thought Content:Logical  History of Schizophrenia/Schizoaffective disorder:No  Duration of Psychotic Symptoms:Less than six months  Hallucinations:Hallucinations: None  Ideas of Reference:None  Suicidal Thoughts:Suicidal Thoughts: No  Homicidal Thoughts:Homicidal Thoughts: No  Sensorium  Memory:Immediate Good; Recent Good; Remote Good  Judgment:Good  Insight:Good  Executive Functions  Concentration:Good  Attention Span:Good  Recall:Good  Fund of Knowledge:Good  Language:Good; Other (comment) (Communicates via American sign language)  Psychomotor Activity  Psychomotor Activity:Psychomotor Activity: Normal  Assets  Assets:Communication Skills; Desire for Improvement; Housing; Physical Health; Resilience; Social Support  Sleep  Sleep:Sleep: Good Number of Hours of Sleep: 6.5  Physical Exam: Physical Exam Constitutional:      Appearance: Normal appearance.  HENT:     Head: Normocephalic.  Pulmonary:     Effort: Pulmonary effort is normal.  Musculoskeletal:        General: Normal range of motion.     Cervical back: Normal range of motion.  Neurological:     General: No focal deficit present.     Mental Status: He is alert and oriented to person, place, and time.   Review of Systems  Constitutional: Negative.  Negative for fever.  HENT: Negative.  Negative for congestion, sinus pain and sore throat.   Respiratory: Negative.  Negative for cough and shortness of breath.   Cardiovascular:  Negative.  Negative for chest pain.  Gastrointestinal: Negative.   Genitourinary: Negative.   Musculoskeletal: Negative.   Neurological: Negative.    Blood pressure 119/76, pulse 97, temperature 98.7 F (37.1 C), temperature source Oral, resp. rate 16, height 6\' 4"  (1.93 m), weight 81.6 kg, SpO2 97 %. Body mass index is 21.91 kg/m.   Social History   Tobacco Use  Smoking Status Never  Smokeless Tobacco Never   Tobacco Cessation:  N/A, patient does not currently use tobacco products   Blood Alcohol level:  Lab Results  Component Value Date   ETH <10 05/05/2021   ETH <10 11/26/2019    Metabolic Disorder Labs:  Lab Results  Component Value Date   HGBA1C 5.0 05/06/2021   MPG 96.8 05/06/2021   MPG 99.67 10/18/2020   No results found for: PROLACTIN Lab Results  Component Value Date   CHOL 138 05/06/2021   TRIG 77 05/06/2021   HDL 33 (L) 05/06/2021   CHOLHDL 4.2 05/06/2021   VLDL 15 05/06/2021   LDLCALC 90 05/06/2021   LDLCALC 93 10/18/2020    See Psychiatric Specialty Exam and Suicide Risk Assessment completed by Attending Physician prior to discharge.  Discharge destination:  Home  Is patient on multiple antipsychotic therapies at discharge:  No   Has Patient had three or more failed trials of antipsychotic monotherapy by history:  No  Recommended Plan for Multiple Antipsychotic Therapies: NA  Discharge Instructions     Diet - low sodium heart healthy   Complete by: As directed    Increase activity slowly   Complete by: As directed       Allergies as of 05/12/2021   No Known Allergies      Medication List     STOP taking these medications    acetaminophen 500 MG tablet Commonly known as: TYLENOL   hydrOXYzine 25 MG tablet Commonly known as: ATARAX/VISTARIL   OLANZapine 10 MG tablet Commonly known as: ZyPREXA Replaced by: olanzapine zydis 15 MG disintegrating tablet   QUEtiapine 200 MG tablet Commonly known as: SEROQUEL       TAKE  these medications      Indication  Biktarvy 50-200-25 MG Tabs tablet Generic drug: bictegravir-emtricitabine-tenofovir AF Take 1 tablet by mouth daily.  Indication: HIV Disease   cholecalciferol 25 MCG (1000 UNIT) tablet Commonly known as: VITAMIN D3 Take 1,000 Units by mouth daily.  Indication: Osteoporosis   escitalopram 20 MG tablet Commonly known as: LEXAPRO Take 1 tablet (20 mg total) by mouth daily. Start taking on: May 13, 2021 What changed: how much to take  Indication: Major Depressive Disorder   Fish Oil 1000 MG Cpdr Take 1,000 mg by mouth daily.  Indication: High Amount of Cholesterol in the Blood   gabapentin 100 MG capsule Commonly known as: NEURONTIN Take 1 capsule (100 mg total) by mouth 3 (three) times daily.  Indication: Neuropathic Pain, Anxiety   olanzapine zydis 15 MG disintegrating tablet Commonly known as: ZYPREXA Take 1 tablet (15 mg total) by mouth at bedtime. Replaces: OLANZapine 10 MG tablet  Indication: MIXED BIPOLAR AFFECTIVE DISORDER, Mood control   tamsulosin 0.4 MG Caps capsule Commonly known as: FLOMAX Take 0.4 mg by mouth daily.  Indication: Benign Enlargement of Prostate        Follow-up Information     [facility] Follow up.   Specialty: Behavioral Health Why: Please go to this provider for therapy and medication management services during walk in hours:  Monday through Thursday, from 7:45 am to 11:00 am.  Services will be provided on a first come, first served basis. Contact information: [address] [place] [address] [phone]        [facility]. Call.   Specialty: Hospice and Palliative Medicine Why: Please call to personally schedule an appointment for bereavement/grief counseling services. Contact information: [address] [place] [address] [phone]                Follow-up recommendations:  Activity:  as tolerated Diet:   Heart healthy  Comments:  Prescriptions were given at discharge.  Patient is agreeable with the plan.  He was given opportunity to ask questions.  He appears to feel comfortable with discharge and denies any current suicidal or homicidal thoughts.   Patient is instructed prior to discharge to: Take all medications as prescribed by his mental healthcare provider. Report any adverse effects and or reactions from the medicines to his outpatient provider promptly. Patient has been instructed & cautioned: To not engage in alcohol and or illegal drug use while on prescription medicines. In the event of worsening symptoms, patient is instructed to call the crisis hotline, 911 and or go to the nearest ED  for appropriate evaluation and treatment of symptoms. To follow-up with his primary care provider for your other medical issues, concerns and or health care needs.   Signed: Laveda Abbe, NP 05/12/2021, 11:31 AM

## 2021-05-12 NOTE — BHH Group Notes (Signed)
Pt did not attend nursing group. 
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
## 2021-05-12 NOTE — Final Progress Note (Signed)
Discharge Note:  Patient denies SI/HI AVH at this time. Discharge with ASL interpreter,  instructions, AVS, prescriptions and transition record gone over with patient. Patient agrees to comply with medication management, follow-up visit, and outpatient therapy. Patient belongings returned to patient. Patient questions and concerns addressed and answered.  Patient ambulatory off unit.  Patient discharged to home with friend.
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
## 2021-05-26 ENCOUNTER — Ambulatory Visit: Payer: Self-pay

## 2021-06-02 ENCOUNTER — Other Ambulatory Visit: Payer: Self-pay

## 2021-06-02 ENCOUNTER — Encounter (HOSPITAL_COMMUNITY): Payer: No Payment, Other | Admitting: Physician Assistant

## 2021-06-03 ENCOUNTER — Encounter (HOSPITAL_COMMUNITY): Payer: No Payment, Other | Admitting: Physician Assistant

## 2021-06-06 ENCOUNTER — Other Ambulatory Visit: Payer: Self-pay

## 2021-06-06 ENCOUNTER — Emergency Department (HOSPITAL_COMMUNITY)
Admission: EM | Admit: 2021-06-06 | Discharge: 2021-06-08 | Disposition: A | Discharge status: Home / Self Care | Payer: Self-pay | Attending: Emergency Medicine | Admitting: Emergency Medicine

## 2021-06-06 ENCOUNTER — Encounter (HOSPITAL_COMMUNITY): Payer: Self-pay

## 2021-06-06 DIAGNOSIS — R441 Visual hallucinations: Secondary | ICD-10-CM | POA: Insufficient documentation

## 2021-06-06 DIAGNOSIS — F4310 Post-traumatic stress disorder, unspecified: Secondary | ICD-10-CM | POA: Insufficient documentation

## 2021-06-06 DIAGNOSIS — T1491XA Suicide attempt, initial encounter: Secondary | ICD-10-CM

## 2021-06-06 DIAGNOSIS — R45851 Suicidal ideations: Secondary | ICD-10-CM | POA: Insufficient documentation

## 2021-06-06 DIAGNOSIS — Z21 Asymptomatic human immunodeficiency virus [HIV] infection status: Secondary | ICD-10-CM | POA: Insufficient documentation

## 2021-06-06 DIAGNOSIS — F332 Major depressive disorder, recurrent severe without psychotic features: Secondary | ICD-10-CM | POA: Insufficient documentation

## 2021-06-06 DIAGNOSIS — R000 Tachycardia, unspecified: Secondary | ICD-10-CM | POA: Insufficient documentation

## 2021-06-06 LAB — COMPREHENSIVE METABOLIC PANEL
ALT: 14 U/L (ref 0–44)
AST: 18 U/L (ref 15–41)
Albumin: 5 g/dL (ref 3.5–5.0)
Alkaline Phosphatase: 130 U/L — ABNORMAL HIGH (ref 38–126)
Anion gap: 13 (ref 5–15)
BUN: 30 mg/dL — ABNORMAL HIGH (ref 6–20)
CO2: 24 mmol/L (ref 22–32)
Calcium: 10.2 mg/dL (ref 8.9–10.3)
Chloride: 102 mmol/L (ref 98–111)
Creatinine, Ser: 1.52 mg/dL — ABNORMAL HIGH (ref 0.61–1.24)
GFR, Estimated: 52 mL/min — ABNORMAL LOW (ref >60)
Glucose, Bld: 119 mg/dL — ABNORMAL HIGH (ref 70–99)
Potassium: 3.8 mmol/L (ref 3.5–5.1)
Sodium: 139 mmol/L (ref 135–145)
Total Bilirubin: 1.4 mg/dL — ABNORMAL HIGH (ref 0.3–1.2)
Total Protein: 9 g/dL — ABNORMAL HIGH (ref 6.5–8.1)

## 2021-06-06 LAB — RAPID URINE DRUG SCREEN, HOSP PERFORMED
Amphetamines: NOT DETECTED
Barbiturates: NOT DETECTED
Benzodiazepines: POSITIVE — AB
Cocaine: NOT DETECTED
Opiates: NOT DETECTED
Tetrahydrocannabinol: NOT DETECTED

## 2021-06-06 LAB — CBC
HCT: 56.8 % — ABNORMAL HIGH (ref 39.0–52.0)
Hemoglobin: 19 g/dL — ABNORMAL HIGH (ref 13.0–17.0)
MCH: 31.4 pg (ref 26.0–34.0)
MCHC: 33.5 g/dL (ref 30.0–36.0)
MCV: 93.7 fL (ref 80.0–100.0)
Platelets: 399 10*3/uL (ref 150–400)
RBC: 6.06 MIL/uL — ABNORMAL HIGH (ref 4.22–5.81)
RDW: 13.1 % (ref 11.5–15.5)
WBC: 11.8 10*3/uL — ABNORMAL HIGH (ref 4.0–10.5)
nRBC: 0 % (ref 0.0–0.2)

## 2021-06-06 LAB — BLOOD GAS, VENOUS
Acid-Base Excess: 0.3 mmol/L (ref 0.0–2.0)
Bicarbonate: 23.6 mmol/L (ref 20.0–28.0)
O2 Saturation: 33 %
Patient temperature: 98.6
pCO2, Ven: 36.4 mmHg — ABNORMAL LOW (ref 44.0–60.0)
pH, Ven: 7.428 (ref 7.250–7.430)
pO2, Ven: 21.2 mmHg — CL (ref 32.0–45.0)

## 2021-06-06 LAB — ETHANOL: Alcohol, Ethyl (B): <10 mg/dL (ref <10)

## 2021-06-06 LAB — ACETAMINOPHEN LEVEL: Acetaminophen (Tylenol), Serum: <10 ug/mL — ABNORMAL LOW (ref 10–30)

## 2021-06-06 LAB — SALICYLATE LEVEL: Salicylate Lvl: <7 mg/dL — ABNORMAL LOW (ref 7.0–30.0)

## 2021-06-06 MED ORDER — BICTEGRAVIR-EMTRICITAB-TENOFOV 50-200-25 MG PO TABS
1.0000 | ORAL_TABLET | Freq: Every day | ORAL | Status: DC
  Administered 2021-06-07 – 2021-06-08 (×2): 1 via ORAL
  Filled 2021-06-06 (×2): qty 1

## 2021-06-06 MED ORDER — GABAPENTIN 100 MG PO CAPS
100.0000 mg | ORAL_CAPSULE | Freq: Three times a day (TID) | ORAL | Status: DC
Start: 2021-06-06 — End: 2021-06-08
  Administered 2021-06-06 – 2021-06-08 (×6): 100 mg via ORAL
  Filled 2021-06-06 (×6): qty 1

## 2021-06-06 MED ORDER — SODIUM CHLORIDE 0.9 % IV BOLUS
1000.0000 mL | Freq: Once | INTRAVENOUS | Status: AC
  Administered 2021-06-06: 1000 mL via INTRAVENOUS

## 2021-06-06 MED ORDER — LORAZEPAM 1 MG PO TABS
1.0000 mg | ORAL_TABLET | Freq: Once | ORAL | Status: AC
  Administered 2021-06-06: 1 mg via ORAL
  Filled 2021-06-06: qty 1

## 2021-06-06 MED ORDER — OLANZAPINE 5 MG PO TBDP
15.0000 mg | ORAL_TABLET | Freq: Every day | ORAL | Status: DC
  Administered 2021-06-06 – 2021-06-08 (×2): 15 mg via ORAL
  Filled 2021-06-06 (×2): qty 1

## 2021-06-06 MED ORDER — ESCITALOPRAM OXALATE 10 MG PO TABS
20.0000 mg | ORAL_TABLET | Freq: Every day | ORAL | Status: DC
  Administered 2021-06-06 – 2021-06-08 (×3): 20 mg via ORAL
  Filled 2021-06-06 (×3): qty 2

## 2021-06-06 NOTE — ED Notes (Signed)
Pt has a sweater with sweat pants, a pair of shoes, and a red hat. Items placed in a white. Pt also has duffle bag with clothes inside. All items located behind triage area.
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
## 2021-06-06 NOTE — ED Notes (Signed)
Spoke w/ [facility] and they are going to clear him off of their tox board, can call back if anything gets worse.

## 2021-06-06 NOTE — ED Provider Notes (Signed)
[facility]-EMERGENCY DEPT Provider Note   CSN: [id number] Arrival date & time: 06/06/21  0820     History Chief Complaint  Patient presents with   Suicidal    Tyler Horn is a 60 y.o. male with a history of HIV, chronic hepatitis, anxiety, depression.  Presents to the emergency department with a chief complaint of suicide attempt and visual hallucinations.  Patient reports that he has been out of his medications for the last week.  Over this time he has been feeling "out of my mind."  Patient reports that this led him to attempt suicide yesterday morning.  Patient reports taking approximately 50 over-the-counter sleeping pills.  Patient reports that he got this medication from [facility] and the medication started with the letter N.  Patient has no other information on what medication he took.  Patient states he took this in an attempt to harm himself.  Patient endorses feeling generalized weakness throughout his entire body.  Patient endorses visual hallucinations.  Patient states he is seeing "spirits, they tapped me on the shoulder," and "spiders climbing on the walls."  Patient denies any auditory hallucinations, homicidal ideations, EtOH use, illicit drug use.  Interview was conducted using language interpreter for ASL.  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/16/2020   Vision changes 07/09/2020   Weight loss 07/09/2020   Idiopathic peripheral neuropathy 04/27/2020   Herpes simplex 04/27/2020   Herpes zoster  04/27/2020   Healthcare maintenance 12/19/2019   Early syphilis, 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   Post-traumatic stress disorder, unspecified 11/28/2019   Sedative, 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   MDD (major depressive disorder), recurrent severe, without psychosis (HCC) 07/13/2017   Major depressive disorder, recurrent, severe with psychotic features (HCC) 07/13/2017   MDD (major depressive disorder), recurrent, severe, with psychosis (HCC) 07/31/2014   Benzodiazepine misuse 07/31/2014   Human immunodeficiency virus infection (HCC)    Delusional disorder (HCC) 07/30/2014   Suicidal thoughts 07/30/2014   Visual hallucinations    Generalized anxiety disorder 03/26/2013    Past Surgical History:  Procedure Laterality Date   CATARACT EXTRACTION Left        Family History  Problem Relation Age of Onset   Mental illness Mother    Bipolar disorder Brother     Social History   Tobacco Use   Smoking status: Never   Smokeless tobacco: Never  Vaping Use   Vaping Use: Never used  Substance Use Topics   Alcohol use: No   Drug use: No    Home Medications Prior to Admission medications   Medication Sig Start Date End Date Taking? Authorizing Provider  bictegravir-emtricitabine-tenofovir AF (BIKTARVY) 50-200-25 MG TABS tablet Take 1 tablet by mouth daily. 04/14/21   Veryl Speak, FNP  cholecalciferol (VITAMIN D3) 25 MCG (1000 UNIT) tablet Take 1,000 Units by mouth daily.    [provider]  escitalopram (LEXAPRO) 20 MG tablet Take 1 tablet (20 mg total) by mouth daily. 05/13/21   Elta Guadeloupe  Micheline Rough, NP  gabapentin (NEURONTIN) 100 MG capsule Take 1 capsule (100 mg total) by mouth 3 (three) times daily. 05/12/21   Laveda Abbe, NP  olanzapine zydis (ZYPREXA) 15 MG disintegrating tablet Take 1 tablet (15 mg total) by mouth at bedtime. 05/12/21   Laveda Abbe, NP  Omega-3 Fatty Acids (FISH OIL) 1000 MG  CPDR Take 1,000 mg by mouth daily.    [provider]  tamsulosin (FLOMAX) 0.4 MG CAPS capsule Take 0.4 mg by mouth daily.    [provider]    Allergies    Patient has no known allergies.  Review of Systems   Review of Systems  Constitutional:  Negative for chills and fever.  Eyes:  Negative for visual disturbance.  Respiratory:  Negative for shortness of breath.   Cardiovascular:  Negative for chest pain and palpitations.  Gastrointestinal:  Negative for abdominal pain, nausea and vomiting.  Genitourinary:  Negative for difficulty urinating.  Musculoskeletal:  Negative for back pain and neck pain.  Skin:  Negative for color change and rash.  Neurological:  Positive for weakness (Generalized). Negative for dizziness, syncope, light-headedness and headaches.  Psychiatric/Behavioral:  Positive for hallucinations, self-injury and suicidal ideas. Negative for confusion. The patient is nervous/anxious.    Physical Exam Updated Vital Signs BP (!) 142/110 (BP Location: Right Arm)   Pulse (!) 112   Temp 97.7 F (36.5 C) (Oral)   Resp 18   Ht 6\' 4"  (1.93 m)   Wt 86.2 kg   SpO2 95%   BMI 23.13 kg/m   Physical Exam Vitals and nursing note reviewed.  Constitutional:      General: He is not in acute distress.    Appearance: He is not ill-appearing, toxic-appearing or diaphoretic.  HENT:     Head: Normocephalic.  Eyes:     General: No scleral icterus.       Right eye: No discharge.        Left eye: No discharge.  Cardiovascular:     Rate and Rhythm: Tachycardia present.     Pulses:          Radial pulses are 2+ on the right side and 2+ on the left side.     Heart sounds: Normal heart sounds.     Comments: Tachycardic at rate of 112 Pulmonary:     Effort: Pulmonary effort is normal. No tachypnea, bradypnea or respiratory distress.     Breath sounds: Normal breath sounds.  Abdominal:     General: Abdomen is flat. There is no distension. There are no signs of  injury.     Palpations: Abdomen is soft. There is no mass.     Tenderness: There is no abdominal tenderness. There is no guarding or rebound.  Skin:    General: Skin is warm and dry.  Neurological:     General: No focal deficit present.     Mental Status: He is alert.     GCS: GCS eye subscore is 4. GCS verbal subscore is 5. GCS motor subscore is 6.  Psychiatric:        Attention and Perception: He is attentive. He perceives visual hallucinations. He does not perceive auditory hallucinations.        Mood and Affect: Mood is anxious.        Behavior: Behavior is cooperative.        Thought Content: Thought content includes suicidal ideation. Thought content does not include homicidal ideation. Thought content includes suicidal plan. Thought content  does not include homicidal plan.    ED Results / Procedures / Treatments   Labs (all labs ordered are listed, but only abnormal results are displayed) Labs Reviewed  COMPREHENSIVE METABOLIC PANEL - Abnormal; Notable for the following components:      Result Value   Glucose, Bld 119 (*)    BUN 30 (*)    Creatinine, Ser 1.52 (*)    Total Protein 9.0 (*)    Alkaline Phosphatase 130 (*)    Total Bilirubin 1.4 (*)    GFR, Estimated 52 (*)    All other components within normal limits  SALICYLATE LEVEL - Abnormal; Notable for the following components:   Salicylate Lvl <7.0 (*)    All other components within normal limits  ACETAMINOPHEN LEVEL - Abnormal; Notable for the following components:   Acetaminophen (Tylenol), Serum <10 (*)    All other components within normal limits  CBC - Abnormal; Notable for the following components:   WBC 11.8 (*)    RBC 6.06 (*)    Hemoglobin 19.0 (*)    HCT 56.8 (*)    All other components within normal limits  RAPID URINE DRUG SCREEN, HOSP PERFORMED - Abnormal; Notable for the following components:   Benzodiazepines POSITIVE (*)    All other components within normal limits  BLOOD GAS, VENOUS - Abnormal;  Notable for the following components:   pCO2, Ven 36.4 (*)    pO2, Ven 21.2 (*)    All other components within normal limits  ETHANOL    EKG EKG Interpretation  Date/Time:  Saturday June 06 2021 09:36:32 EDT Ventricular Rate:  101 PR Interval:  154 QRS Duration: 90 QT Interval:  309 QTC Calculation: 401 R Axis:   90 Text Interpretation: Sinus tachycardia Multiform ventricular premature complexes Probable left atrial enlargement Borderline right axis deviation Anteroseptal infarct, age indeterminate ST elevation, consider inferior injury Sinus tachycardia, multiple frequent PVCs, intervals normal Confirmed by Coralee Pesa [phone]) on 06/06/2021 10:29:19 AM  Radiology No results found.  Procedures Procedures   Medications Ordered in ED Medications  escitalopram (LEXAPRO) tablet 20 mg (has no administration in time range)  gabapentin (NEURONTIN) capsule 100 mg (has no administration in time range)  OLANZapine zydis (ZYPREXA) disintegrating tablet 15 mg (has no administration in time range)  bictegravir-emtricitabine-tenofovir AF (BIKTARVY) 50-200-25 MG per tablet 1 tablet (has no administration in time range)  sodium chloride 0.9 % bolus 1,000 mL (1,000 mLs Intravenous New Bag/Given 06/06/21 1220)  LORazepam (ATIVAN) tablet 1 mg (1 mg Oral Given 06/06/21 1208)    ED Course  I have reviewed the triage vital signs and the nursing notes.  Pertinent labs & imaging results that were available during my care of the patient were reviewed by me and considered in my medical decision making (see chart for details).  Clinical Course as of 06/06/21 1533  Sat Jun 06, 2021  1013 Spoke to [place] with poison control who advised that over-the-counter sleep medications are usually melatonin or diphenhydramine.  Advised to watch the patient for 6 hours with a goal of vital signs normalized.  If patient starts becoming drowsy repeat Tylenol, salicylate, and liver function at 4-hour mark.   [PB]     Clinical Course User Index [PB] Berneice Heinrich   MDM Rules/Calculators/A&P                           Alert 60 year old male no acute distress, nontoxic-appearing.  Presents emergency  department complaint of suicide attempt and visual hallucinations.  Patient reports attempted suicide by taking approximately 50 over-the-counter sleeping medications.  Poison control was contacted and advised to watch patient for 6 hours with goal of normal vital signs.  If patient becomes drowsy repeat Tylenol, salicylate, and liver function.  CT shows hemoconcentration, likely secondary to dehydration in light of increased creatinine and BUN.  Will give patient 1 L fluid bolus.  Additionally will check VBG to evaluate for any abnormalities due to patient's overdose.  Patient is positive for benzodiazepines.  Per PDMP patient has no active prescription for benzodiazepines.  Patient had no drowsiness on serial reexamination.  Patient alert and oriented.  Vital signs normalized. Patient medically cleared at this time.  Patient care and treatment were discussed with attending physician Dr. Wilkie Aye.  Final Clinical Impression(s) / ED Diagnoses Final diagnoses:  Suicide attempt [facility])    Rx / DC Orders ED Discharge Orders     None        Haskel Schroeder, PA-C 06/06/21 1535    Horton, Clabe Seal, DO 06/07/21 [phone]

## 2021-06-06 NOTE — ED Notes (Addendum)
From [place]:  TTS completed. Cecilio Asper, NP recommends inpatient psychiatric treatment. [facility] and [facility] are at capacity. Other facilities will be contacted for placement. RN informed patient and he understands.

## 2021-06-06 NOTE — ED Triage Notes (Signed)
Patient states he is suicidal and states that he took 50 sleeping pills yesterday morning. Patient also reports that he is out of his psych meds and "feels screwed up." Patient denies any other drug use, alcohol or HI. Patient states he is having visual hallucinations-ghosts and darkness.   Patient is deaf.

## 2021-06-07 MED ORDER — HYDROXYZINE HCL 25 MG PO TABS
25.0000 mg | ORAL_TABLET | Freq: Three times a day (TID) | ORAL | Status: DC | PRN
  Administered 2021-06-07 – 2021-06-08 (×2): 25 mg via ORAL
  Filled 2021-06-07 (×2): qty 1

## 2021-06-07 NOTE — Progress Notes (Signed)
Per Teodora Medici, patient meets criteria for inpatient treatment. There are no available or appropriate beds at [facility] today. CSW faxed referrals to the following facilities for review:  [facility]  Pending - No Request Sent N/A [address], [place] [address] [phone] --  [facility]  Pending - No Request Sent N/A [address] [place] [address] [phone] --  [facility]  Pending - No Request Sent N/A [address] [place] [address] [phone] --  [facility]  Pending - No Request Sent N/A [address] [place] [address] [phone] --  [facility]  Pending - No Request Sent N/A [address] [place] [address] [phone] --  [facility]  Pending - No Request Sent N/A [address] [place] [address] [phone] --  [facility]  Pending - No Request Sent N/A [address] [place], [place] [address] [phone] --  [facility]  Pending - No Request Sent N/A [address] [place] [address] [phone] --  [facility]  Pending - No Request Sent N/A [address] [place] [address] [phone] --  [facility]  Pending - No Request Sent N/A [address] [place] [address] [phone] --  [facility]  Pending - No Request Sent N/A [address] [place] [address] [phone] --  [facility]  Pending - No Request Sent N/A [address] [place] [address] [phone] --  [facility]  Pending - No Request Sent N/A [address] [place] [address] [phone] --  [facility]  Pending - No Request Sent N/A [address] [place] [address] [phone] --  [facility]  Pending - No Request Sent N/A [address], [place] [address] [phone] --  [facility]  Pending - No Request Sent N/A [address], [place] [address] [phone] --  [facility]  Pending - No Request Sent N/A [address], [place] [address] [phone] --  [facility]  Pending - No Request Sent N/A [address], [place] [address] [phone] --  [facility]  Pending - No Request Sent N/A [address] [place] [address] [phone] --   TTS will continue to seek bed placement.  Crissie Reese, MSW, LCSW-A, LCAS-A Phone: [phone] Disposition/TOC

## 2021-06-07 NOTE — ED Provider Notes (Signed)
Emergency Medicine Observation Re-evaluation Note  Tyler Horn is a 60 y.o. male, seen on rounds today.  Pt initially presented to the ED for complaints of Suicidal Currently, the patient is sleeping.  Physical Exam  BP 108/73 (BP Location: Right Arm)   Pulse 73   Temp 98.1 F (36.7 C) (Oral)   Resp 16   Ht 6\' 4"  (1.93 m)   Wt 86.2 kg   SpO2 99%   BMI 23.13 kg/m  Physical Exam General: No distress Lungs: Resp even and unlabored Psych: Sleeping soundly  ED Course / MDM  EKG:EKG Interpretation  Date/Time:  Saturday June 06 2021 09:36:32 EDT Ventricular Rate:  101 PR Interval:  154 QRS Duration: 90 QT Interval:  309 QTC Calculation: 401 R Axis:   90 Text Interpretation: Sinus tachycardia Multiform ventricular premature complexes Probable left atrial enlargement Borderline right axis deviation Anteroseptal infarct, age indeterminate ST elevation, consider inferior injury Sinus tachycardia, multiple frequent PVCs, intervals normal Confirmed by [id number] [phone]) on 06/06/2021 10:29:19 AM  I have reviewed the labs performed to date as well as medications administered while in observation.  Recent changes in the last 24 hours include none.  Plan  Current plan is for inpatient psych admission.  Tyler Horn is not under involuntary commitment.     [name], MD 06/07/21 [phone]

## 2021-06-07 NOTE — Progress Notes (Signed)
CSW followed-up with [place]  with [facility] in reference to her reviewing this patient for possible placement. It was reported that there are currently no appropriate beds available.  Crissie Reese, MSW, LCSW-A, LCAS-A Phone: [phone] Disposition/TOC

## 2021-06-08 ENCOUNTER — Encounter (HOSPITAL_COMMUNITY): Payer: Self-pay | Admitting: Emergency Medicine

## 2021-06-08 DIAGNOSIS — T1491XA Suicide attempt, initial encounter: Secondary | ICD-10-CM

## 2021-06-08 MED ORDER — ESCITALOPRAM OXALATE 20 MG PO TABS: 20.0000 mg | ORAL_TABLET | Freq: Every day | ORAL | 0 refills | Status: DC

## 2021-06-08 MED ORDER — BISACODYL 10 MG RE SUPP
10.0000 mg | Freq: Once | RECTAL | Status: AC
  Administered 2021-06-08: 10 mg via RECTAL
  Filled 2021-06-08: qty 1

## 2021-06-08 MED ORDER — OLANZAPINE 15 MG PO TBDP: 15.0000 mg | ORAL_TABLET | Freq: Every day | ORAL | 0 refills | Status: DC

## 2021-06-08 MED ORDER — GABAPENTIN 100 MG PO CAPS: 100.0000 mg | ORAL_CAPSULE | Freq: Three times a day (TID) | ORAL | 0 refills | Status: DC

## 2021-06-08 MED ORDER — LORAZEPAM 1 MG PO TABS: 1.0000 mg | ORAL_TABLET | Freq: Three times a day (TID) | ORAL | 0 refills | Status: DC | PRN

## 2021-06-08 MED ORDER — LORAZEPAM 1 MG PO TABS
1.0000 mg | ORAL_TABLET | Freq: Once | ORAL | Status: AC
  Administered 2021-06-08: 1 mg via ORAL
  Filled 2021-06-08: qty 1

## 2021-06-08 NOTE — Discharge Instructions (Addendum)
Prescriptions for 2 days of Lexapro, Gabapentin and Zyprexa were called in to [facility]. Please make sure you attend your appoint at [facility] for any additional refills.

## 2021-06-08 NOTE — ED Notes (Signed)
Patient was given his breakfast tray.

## 2021-06-08 NOTE — ED Provider Notes (Addendum)
Emergency Medicine Observation Re-evaluation Note  Sayeed Weatherall is a 60 y.o. male, seen on rounds today.  Pt initially presented to the ED for complaints of Suicidal Currently, the patient is sleeping.  Physical Exam  BP 107/72   Pulse 77   Temp 97.7 F (36.5 C)   Resp 17   Ht 6\' 4"  (1.93 m)   Wt 86.2 kg   SpO2 96%   BMI 23.13 kg/m  Physical Exam General: No distress Lungs: Resp even and unlabored Psych: Sleeping  ED Course / MDM  EKG:EKG Interpretation  Date/Time:  Saturday June 06 2021 09:36:32 EDT Ventricular Rate:  101 PR Interval:  154 QRS Duration: 90 QT Interval:  309 QTC Calculation: 401 R Axis:   90 Text Interpretation: Sinus tachycardia Multiform ventricular premature complexes Probable left atrial enlargement Borderline right axis deviation Anteroseptal infarct, age indeterminate ST elevation, consider inferior injury Sinus tachycardia, multiple frequent PVCs, intervals normal Confirmed by [id number] [phone]) on 06/06/2021 10:29:19 AM  I have reviewed the labs performed to date as well as medications administered while in observation.  Recent changes in the last 24 hours include none.  Plan  Current plan is for Inpatient psych admission.  Marvion Bastidas is not under involuntary commitment.  12:31 PM Patient seen by psych and cleared for discharge.    [name], MD 06/08/21 [phone]

## 2021-06-08 NOTE — Consult Note (Signed)
[facility] Face-to-Face Psychiatry Consult   Reason for Consult:  Suicide attempt by ingestion Referring Physician:  Manya Silvas Patient Identification: Tyler Horn MRN:  [id number] Principal Diagnosis: <principal problem not specified> Diagnosis:  Active Problems:   * No active hospital problems. *   Total Time spent with patient: 20 minutes  Subjective:  Tyler Horn is a 60 y.o. male patient admitted  who presented unaccompanied to [facility] ED reporting he attempted suicide yesterday by ingesting 50 tabs of nonprescription sleeping medication. He says he considered taking more medication today but decided drive to [facility] to seek help. Pt has a diagnosis of HIV, major depressive disorder with psychotic features, GAD  and PTSD. He says he ran out of his psychiatric medications 1 weeks ago and has felt severely depressed and lonely since then. Pt acknowledges symptoms including crying spells, social withdrawal, loss of interest in usual pleasures, fatigue, irritability, decreased concentration, and feelings of worthlessness, helplessness and hopelessness. He reports a history of suicide attempts by overdose and cutting his wrist. He states he is experiencing visual hallucinations of seeing "spirits", lights, and bugs crawling on the walls. He denies homicidal ideation or history of violence. He denies auditory hallucinations. He denies use of alcohol or other substances.   Psychiatric reassessment 10/10: Today, patient was seen face to face with TTS counselor Pollyann Glen present. Patient was seen using ASL interpreter, he is deaf. Patient stated he is feeling much better today and feels safe to be discharged. He stated he ran out of his mental health meds about a week ago and has an appointment tomorrow for medication management at [facility]. Patient stated he was discharged from [facility] in September and only had a seven day supply of medication when he left. Patient is requesting enough medication  to get him to his appointment. He has had multiple previous inpatient admissions for depression and suicidal ideation with suicide attempts by overdose. Today, patient denies suicidal ideation, plan or intent. He denies AH, HI, paranoia and delusions. He does not appear to be responding to internal stimuli. He stated he sometimes has visual hallucinations but these are better today. He denies access to weapons. His judgement is good and insight is present. He understands return precautions should he start having suicidal thoughts again. Patient stated he feels safe at home. Patient was accepted to [facility] but when given this option he decided he wants to be discharged and attend his appointment tomorrow. Patient is calm and cooperative. He is sitting on the edge of the stretcher and makes good eye contact. His thought process is congruent, linear and goal oriented. He is dressed in purple scrubs with adequate hygiene. He stated he has been sleeping and his appetite is good. Patient is psychiatrically clear.     Past Psychiatric History: PTSD, MDD with psychotic features, HIV, and GAD  Risk to Self:  Denies Risk to Others:   Prior Inpatient Therapy:  Yes  Prior Outpatient Therapy:  Yes   Past Medical History:  Past Medical History:  Diagnosis Date   Anxiety    Chronic hepatitis C (HCC) 04/27/2020   Depression    HIV (human immunodeficiency virus infection) (HCC)     Past Surgical History:  Procedure Laterality Date   CATARACT EXTRACTION Left    Family History:  Family History  Problem Relation Age of Onset   Mental illness Mother    Bipolar disorder Brother    Family Psychiatric  History: Patient denies family history Social History:  Social History   Substance and Sexual Activity  Alcohol Use No     Social History   Substance and Sexual Activity  Drug Use No    Social History   Socioeconomic History   Marital status: Single    Spouse name: Not on file    Number of children: Not on file   Years of education: Not on file   Highest education level: Not on file  Occupational History   Not on file  Tobacco Use   Smoking status: Never   Smokeless tobacco: Never  Vaping Use   Vaping Use: Never used  Substance and Sexual Activity   Alcohol use: No   Drug use: No   Sexual activity: Yes    Comment: pt given condoms  Other Topics Concern   Not on file  Social History Narrative   Not on file   Social Determinants of Health   Financial Resource Strain: Not on file  Food Insecurity: Not on file  Transportation Needs: Not on file  Physical Activity: Not on file  Stress: Not on file  Social Connections: Not on file   Additional Social History:    Allergies:  No Known Allergies  Labs: No results found for this or any previous visit (from the past 48 hour(s)).  Current Facility-Administered Medications  Medication Dose Route Frequency Provider Last Rate Last Admin   bictegravir-emtricitabine-tenofovir AF (BIKTARVY) 50-200-25 MG per tablet 1 tablet  1 tablet Oral Daily Haskel Schroeder, PA-C   1 tablet at 06/08/21 1020   escitalopram (LEXAPRO) tablet 20 mg  20 mg Oral Daily Haskel Schroeder, PA-C   20 mg at 06/08/21 9323   gabapentin (NEURONTIN) capsule 100 mg  100 mg Oral TID Haskel Schroeder, PA-C   100 mg at 06/08/21 5573   hydrOXYzine (ATARAX/VISTARIL) tablet 25 mg  25 mg Oral TID PRN Pollyann Savoy, MD   25 mg at 06/08/21 0428   OLANZapine zydis (ZYPREXA) disintegrating tablet 15 mg  15 mg Oral QHS Haskel Schroeder, PA-C   15 mg at 06/08/21 2202   Current Outpatient Medications  Medication Sig Dispense Refill   bictegravir-emtricitabine-tenofovir AF (BIKTARVY) 50-200-25 MG TABS tablet Take 1 tablet by mouth daily. 30 tablet 5   cholecalciferol (VITAMIN D3) 25 MCG (1000 UNIT) tablet Take 1,000 Units by mouth daily.     LORazepam (ATIVAN) 1 MG tablet Take 1 tablet (1 mg total) by mouth 3 (three) times daily as  needed for anxiety. 3 tablet 0   Omega-3 Fatty Acids (FISH OIL) 1000 MG CPDR Take 1,000 mg by mouth daily.     QUEtiapine (SEROQUEL) 200 MG tablet Take 200 mg by mouth at bedtime.     tamsulosin (FLOMAX) 0.4 MG CAPS capsule Take 0.4 mg by mouth at bedtime.     escitalopram (LEXAPRO) 20 MG tablet Take 1 tablet (20 mg total) by mouth daily for 1 day. 1 tablet 0   gabapentin (NEURONTIN) 100 MG capsule Take 1 capsule (100 mg total) by mouth 3 (three) times daily. 3 capsule 0   olanzapine zydis (ZYPREXA) 15 MG disintegrating tablet Take 1 tablet (15 mg total) by mouth at bedtime for 1 day. 1 tablet 0    Musculoskeletal: Strength & Muscle Tone: within normal limits Gait & Station: normal Patient leans: N/A            Psychiatric Specialty Exam:  Presentation  General Appearance: Appropriate for Environment; Casual  Eye Contact:Good  Speech:Clear and Coherent; Normal  Rate  Speech Volume:Normal  Handedness:Right   Mood and Affect  Mood:Euthymic  Affect:Congruent   Thought Process  Thought Processes:Goal Directed; Coherent  Descriptions of Associations:Intact  Orientation:Full (Time, Place and Person)  Thought Content:Logical  History of Schizophrenia/Schizoaffective disorder:No  Duration of Psychotic Symptoms:None   Hallucinations:None Ideas of Reference:None  Suicidal Thoughts:Denies Homicidal Thoughts:Denies  Sensorium  Memory:Immediate Good; Recent Good; Remote Good  Judgment:Good  Insight:Good   Executive Functions  Concentration:Good  Attention Span:Good  Recall:Good  Fund of Knowledge:Good  Language:Good   Psychomotor Activity  Psychomotor Activity: No data recorded  Assets  Assets:Communication Skills; Desire for Improvement; Financial Resources/Insurance; Location manager; Social Support  Sleep  Sleep: No data recorded  Physical Exam: Physical Exam Vitals and nursing note reviewed.  Constitutional:       Appearance: Normal appearance.  HENT:     Head: Normocephalic and atraumatic.  Pulmonary:     Effort: Pulmonary effort is normal.  Musculoskeletal:        General: Normal range of motion.     Cervical back: Normal range of motion.  Neurological:     General: No focal deficit present.     Mental Status: He is alert and oriented to person, place, and time.  Psychiatric:        Attention and Perception: Attention normal. He does not perceive auditory or visual hallucinations.        Mood and Affect: Mood normal.        Speech: Speech normal.        Behavior: Behavior normal. Behavior is cooperative.        Thought Content: Thought content is not paranoid or delusional. Thought content does not include homicidal or suicidal ideation. Thought content does not include homicidal or suicidal plan.        Cognition and Memory: Cognition normal.   Review of Systems  Constitutional: Negative.  Negative for fever.  HENT: Negative.  Negative for congestion and sore throat.   Respiratory: Negative.    Cardiovascular: Negative.   Musculoskeletal: Negative.   Neurological: Negative.    Blood pressure (!) 121/58, pulse 95, temperature 97.6 F (36.4 C), temperature source Oral, resp. rate 16, height 6\' 4"  (1.93 m), weight 86.2 kg, SpO2 95 %. Body mass index is 23.13 kg/m.  Treatment Plan Summary: Plan Patient to be discharged home and follow up with medication management tomorrow at [facility].   Disposition: No evidence of imminent risk to self or others at present.   Patient does not meet criteria for psychiatric inpatient admission. Supportive therapy provided about ongoing stressors. Discussed crisis plan, support from social network, calling 911, coming to the Emergency Department, and calling Suicide Hotline.  [facility], NP 06/08/2021 2:37 PM

## 2021-06-08 NOTE — ED Notes (Signed)
[facility] called to confirm that they have accepted him as a patient. SW made aware.

## 2021-06-09 ENCOUNTER — Other Ambulatory Visit: Payer: Self-pay

## 2021-06-09 ENCOUNTER — Encounter (HOSPITAL_COMMUNITY): Payer: Self-pay | Admitting: Physician Assistant

## 2021-06-09 ENCOUNTER — Other Ambulatory Visit (HOSPITAL_COMMUNITY): Payer: Self-pay | Admitting: Psychiatry

## 2021-06-09 ENCOUNTER — Ambulatory Visit (INDEPENDENT_AMBULATORY_CARE_PROVIDER_SITE_OTHER): Payer: No Payment, Other | Admitting: Physician Assistant

## 2021-06-09 VITALS — BP 134/70 | HR 120 | Ht 76.0 in

## 2021-06-09 DIAGNOSIS — F411 Generalized anxiety disorder: Secondary | ICD-10-CM | POA: Diagnosis not present

## 2021-06-09 DIAGNOSIS — F5105 Insomnia due to other mental disorder: Secondary | ICD-10-CM

## 2021-06-09 DIAGNOSIS — F99 Mental disorder, not otherwise specified: Secondary | ICD-10-CM

## 2021-06-09 DIAGNOSIS — F333 Major depressive disorder, recurrent, severe with psychotic symptoms: Secondary | ICD-10-CM | POA: Diagnosis not present

## 2021-06-09 DIAGNOSIS — F410 Panic disorder [episodic paroxysmal anxiety] without agoraphobia: Secondary | ICD-10-CM

## 2021-06-09 MED ORDER — ESCITALOPRAM OXALATE 20 MG PO TABS: 20.0000 mg | ORAL_TABLET | Freq: Every day | ORAL | 1 refills | Status: DC

## 2021-06-09 MED ORDER — OLANZAPINE 10 MG PO TABS: 10.0000 mg | ORAL_TABLET | Freq: Every day | ORAL | 1 refills | Status: DC

## 2021-06-10 ENCOUNTER — Encounter (HOSPITAL_COMMUNITY): Payer: Self-pay | Admitting: Physician Assistant

## 2021-06-10 NOTE — Progress Notes (Signed)
BH MD/PA/NP OP Progress Note  06/10/2021 11:52 PM Tyler Horn  MRN:  [id number]  Chief Complaint:  Chief Complaint   Medication Management    HPI:   Tyler Horn is a 60 year old male with a past psychiatric history significant for insomnia, major depressive disorder with psychotic features, panic attacks, and generalized anxiety disorder who presents to [facility] for follow-up and medication management. Language interpretive services were utilized during the encounter due to patient's use of sign language to communicate. Patient is currently being managed on the following medications:  Escitalopram 40 mg daily Olanzapine 10 mg at bedtime  Patient reports that he was recently discharged from the hospital due to worsening anxiety and panic attacks.  He reports recently having his medications changed and adjusted but states that none of the changes have been beneficial in aiding his anxiety or depression.  All throughout the exam, patient is visibly restless often getting in and out of his chair.  Patient reports that he has ran out of his medications stating that when he was last discharged from the hospital, they had only given him a very limited supply of his medications.  Patient reports that his mind is currently foggy/cloudy and he is unable to focus well.  Patient endorses the following depressive symptoms: difficulty concentrating, low mood, decreased energy, and worthlessness.  Patient reports that he feels that no one cares about him.  He states that people are always reaching out to him and any return, he helps them out but whenever he reaches out to others, he feels no one is available for him.  He states that his good relationships from the past are not present anymore.  Patient rates his anxiety an 8 out of 10.  He denies any new stressors at this time but states that his current regimen of medications have not been helpful at all.   Patient states that he was given Ativan, olanzapine, Lexapro, and gabapentin upon discharge from the hospital.  He states that these recent changes have made him feel lethargic and groggy.  Patient reported that he considered overdosing on his pills not too long ago due to his current state.  A PHQ-9 screen was performed with the patient scoring an 18.  A GAD-7 screen was also performed with the patient scoring a 16.  A [place] Suicide Severity Rating Scale was performed with the patient being considered moderate risk.  Patient is alert and oriented x4, irritable, and very frantic on exam.  Patient is able to answer all questions addressed to him.  Patient endorses fluctuating mood stating that he has been dealing with ongoing highs and lows.  Patient denied suicidal or homicidal ideations.  He further denied auditory hallucinations but states that he has been seeing things recently.  Patient endorses fair sleep and receives on average 8 hours of sleep.  All throughout the exam, patient expressed that he just wants to go to sleep.  Patient endorses fair appetite and eats on average 2-3 meals per day.  Patient denies alcohol use, tobacco use, and illicit drug use.  Visit Diagnosis:    ICD-10-CM   1. Insomnia due to other mental disorder  F51.05 OLANZapine (ZYPREXA) 10 MG tablet   F99     2. Severe episode of recurrent major depressive disorder, with psychotic features (HCC)  F33.3 escitalopram (LEXAPRO) 20 MG tablet    OLANZapine (ZYPREXA) 10 MG tablet    3. Generalized anxiety disorder with panic attacks  F41.1 escitalopram (  LEXAPRO) 20 MG tablet   F41.0       Past Psychiatric History:  Generalized anxiety disorder Major depressive disorder Panic disorder Visual hallucinations  Past Medical History:  Past Medical History:  Diagnosis Date   Anxiety    Chronic hepatitis C (HCC) 04/27/2020   Depression    HIV (human immunodeficiency virus infection) (HCC)     Past Surgical History:   Procedure Laterality Date   CATARACT EXTRACTION Left     Family Psychiatric History:  Family history of psychiatric illness unknown  Family History:  Family History  Problem Relation Age of Onset   Mental illness Mother    Bipolar disorder Brother     Social History:  Social History   Socioeconomic History   Marital status: Single    Spouse name: Not on file   Number of children: Not on file   Years of education: Not on file   Highest education level: Not on file  Occupational History   Not on file  Tobacco Use   Smoking status: Never   Smokeless tobacco: Never  Vaping Use   Vaping Use: Never used  Substance and Sexual Activity   Alcohol use: No   Drug use: No   Sexual activity: Yes    Comment: pt given condoms  Other Topics Concern   Not on file  Social History Narrative   Not on file   Social Determinants of Health   Financial Resource Strain: Not on file  Food Insecurity: Not on file  Transportation Needs: Not on file  Physical Activity: Not on file  Stress: Not on file  Social Connections: Not on file    Allergies: No Known Allergies  Metabolic Disorder Labs: Lab Results  Component Value Date   HGBA1C 5.0 05/06/2021   MPG 96.8 05/06/2021   MPG 99.67 10/18/2020   No results found for: PROLACTIN Lab Results  Component Value Date   CHOL 138 05/06/2021   TRIG 77 05/06/2021   HDL 33 (L) 05/06/2021   CHOLHDL 4.2 05/06/2021   VLDL 15 05/06/2021   LDLCALC 90 05/06/2021   LDLCALC 93 10/18/2020   Lab Results  Component Value Date   TSH 1.199 05/06/2021   TSH 2.025 10/18/2020    Therapeutic Level Labs: No results found for: LITHIUM No results found for: VALPROATE No components found for:  CBMZ  Current Medications: Current Outpatient Medications  Medication Sig Dispense Refill   bictegravir-emtricitabine-tenofovir AF (BIKTARVY) 50-200-25 MG TABS tablet Take 1 tablet by mouth daily. 30 tablet 5   cholecalciferol (VITAMIN D3) 25 MCG (1000  UNIT) tablet Take 1,000 Units by mouth daily.     escitalopram (LEXAPRO) 20 MG tablet Take 1 tablet (20 mg total) by mouth daily. Patient to take 1 tablet (20 mg total) for 6 days followed by 2 tablets (40 mg total) daily 30 tablet 1   gabapentin (NEURONTIN) 100 MG capsule Take 1 capsule (100 mg total) by mouth 3 (three) times daily. 3 capsule 0   LORazepam (ATIVAN) 1 MG tablet Take 1 tablet (1 mg total) by mouth 3 (three) times daily as needed for anxiety. 3 tablet 0   OLANZapine (ZYPREXA) 10 MG tablet Take 1 tablet (10 mg total) by mouth at bedtime. 30 tablet 1   Omega-3 Fatty Acids (FISH OIL) 1000 MG CPDR Take 1,000 mg by mouth daily.     tamsulosin (FLOMAX) 0.4 MG CAPS capsule Take 0.4 mg by mouth at bedtime.     No current facility-administered medications for  this visit.     Musculoskeletal: Strength & Muscle Tone: within normal limits Gait & Station: normal Patient leans: N/A  Psychiatric Specialty Exam: Review of Systems  Psychiatric/Behavioral:  Positive for agitation, decreased concentration, dysphoric mood and hallucinations. Negative for self-injury, sleep disturbance and suicidal ideas. The patient is nervous/anxious. The patient is not hyperactive.    Blood pressure 134/70, pulse (!) 120, height 6\' 4"  (1.93 m).Body mass index is 23.13 kg/m.  General Appearance: Well Groomed  Eye Contact:  Fair  Speech:  Unable to assess due to patient utilizing sign language  Volume:  Unable to assess due to patient utilizing sign language  Mood:  Anxious, Depressed, Dysphoric, Hopeless, and Irritable  Affect:  Congruent, Depressed, and Full Range  Thought Process:  Coherent, Goal Directed, and Descriptions of Associations: Intact  Orientation:  Full (Time, Place, and Person)  Thought Content: WDL   Suicidal Thoughts:  No  Homicidal Thoughts:  No  Memory:  Immediate;   Good Recent;   Fair Remote;   Fair  Judgement:  Good  Insight:  Fair  Psychomotor Activity:  Normal   Concentration:  Concentration: Good and Attention Span: Good  Recall:  Good  Fund of Knowledge: Fair  Language: Unable to assess due to patient utilizing sign language. Patient has no issues being able to convey his chief complaints and thoughts through sign language.  Akathisia:  NA  Handed:  Right  AIMS (if indicated): not done  Assets:  Communication Skills Desire for Improvement Social Support  ADL's:  Intact  Cognition: WNL  Sleep:  Good   Screenings: AIMS    Flowsheet Row [facility]) [facility]  AIMS Total Score 3 0 0      AUDIT    Flowsheet Row [facility]) [facility] Ad[facility] ([facility] Admission (Discharged) from 11/28/2019 in [facility] Admission (Discharged) [facility]  Alcohol Use Disorder Identification Test Final Score (AUDIT) 0 0 0 0      GAD-7    Flowsheet Row Office Visit from 06/09/2021 in [facility] Office Visit from 04/09/2021 in [facility] Office Visit from 02/19/2021 in [facility] Office Visit from 01/07/2021 in [facility] Office Visit from 12/09/2020 in [facility]  Total GAD-7 Score 16 7 20 16 17       PHQ2-9    Flowsheet Row Office Visit from 06/09/2021 in [facility] ED from 05/03/2021 in [facility] Office Visit from 04/09/2021 in [facility] Office Visit from 02/19/2021 in [facility] Office Visit from 01/07/2021 in [facility]  PHQ-2 Total Score 6 2 4 5 6   PHQ-9 Total Score 18 5 11 20 13       Flowsheet Row Office Visit from 06/09/2021 in [facility] ED from 06/06/2021 in [facility] Ad[facility] (Discharged[facility]  C-SSRS RISK CATEGORY Moderate Risk High Risk High Risk        Assessment and Plan:   Tyler Horn is a 60 year old male  with a past psychiatric history significant for insomnia, major depressive disorder with psychotic features, panic attacks, and generalized anxiety disorder who presents to [facility] for follow-up and medication management. Language interpretive services were utilized during the encounter due to patient's use of sign language to communicate.  Patient presented to encounter visibly distraught and anxious.  Patient expresses that his current regimen of medications have not been helpful with the management of his symptoms.  Patient was originally taking olanzapine 10 mg at bedtime and Lexapro 40 mg daily for the management of his symptoms but now patient states that he is currently on the following medications: Ativan, gabapentin, Lexapro, and olanzapine.  Patient states that his current medications were the medications that he was released with following discharge from the hospital.  Patient is unsure of who started him on gabapentin.  Provider recommended patient be placed on the medications that he was taken prior to his current state.  Provider informed patient that the last time he was seen, he was not exhibiting any of the symptoms that he is currently exhibiting today.  Provider recommended patient start with original prescription given to him on his last encounter to determine which medications are helpful or not.  Patient reluctant of  the plan but eventually agreed.  Patient's medications to be e-prescribed to pharmacy of choice.  1. Insomnia due to other mental disorder  - OLANZapine (ZYPREXA) 10 MG tablet; Take 1 tablet (10 mg total) by mouth at bedtime.  Dispense: 30 tablet; Refill: 1  2. Severe episode of recurrent major depressive disorder, with psychotic features (HCC)  - escitalopram (LEXAPRO) 20 MG tablet; Take 1 tablet (20 mg total) by mouth daily. Patient to take 1 tablet (20 mg total) for 6 days followed by 2 tablets (40 mg total) daily  Dispense: 30 tablet; Refill: 1 - OLANZapine (ZYPREXA) 10 MG tablet; Take 1 tablet (10 mg total) by mouth at bedtime.  Dispense: 30 tablet; Refill: 1  3. Generalized anxiety disorder with panic attacks  - escitalopram (LEXAPRO) 20 MG tablet; Take 1 tablet (20 mg total) by mouth daily. Patient to take 1 tablet (20 mg total) for 6 days followed by 2 tablets (40 mg total) daily  Dispense: 30 tablet; Refill: 1  Patient to follow up in 6 weeks Provider spent a total of 32 minutes with the patient/reviewing patient's chart  Meta Hatchet, PA 06/10/2021, 11:52 PM

## 2021-06-25 ENCOUNTER — Ambulatory Visit (HOSPITAL_COMMUNITY)
Admission: EM | Admit: 2021-06-25 | Discharge: 2021-06-25 | Disposition: A | Discharge status: Home / Self Care | Payer: No Payment, Other | Attending: Psychiatry | Admitting: Psychiatry

## 2021-06-25 DIAGNOSIS — Z79899 Other long term (current) drug therapy: Secondary | ICD-10-CM | POA: Insufficient documentation

## 2021-06-25 DIAGNOSIS — F410 Panic disorder [episodic paroxysmal anxiety] without agoraphobia: Secondary | ICD-10-CM | POA: Insufficient documentation

## 2021-06-25 DIAGNOSIS — F411 Generalized anxiety disorder: Secondary | ICD-10-CM | POA: Insufficient documentation

## 2021-06-25 NOTE — Discharge Summary (Addendum)
Tyler Horn to be D/C'd Home per FNP order. Discussed with the patient via ASL interpreter. Writer also informed pt that provider wanted him to follow up with his psychiatry  provider for medication management. Pt was adamant with wanting prescription/medication immediately. Pt was agitated and appears anxious. Provider was notified of pt's request. Provider reiterated same information provided to pt.. Pt  got up and informed writer that he will come back tomorrow. An After Visit Summary was printed and given to the patient. Pt is discharged at this time. Tyler Horn  06/25/2021 11:04 AM     Ta

## 2021-06-25 NOTE — ED Provider Notes (Signed)
Behavioral Health Urgent Care Medical Screening Exam  Patient Name: Tyler Horn MRN: [id number] Date of Evaluation: 06/25/21 Chief Complaint:   Diagnosis:  Final diagnoses:  Generalized anxiety disorder with panic attacks    History of Present illness: Tyler Horn is a 60 y.o. male.  Patient presents voluntarily to [facility] behavioral health for walk-in assessment.  American sign language interpreter EJ interpreter [phone] assisted with interview.  Tyler Horn states "I think my medications are not working, I would like to have more medication." He reports recent stressors include feeling isolated for several years as he has lost contact with his friends.  He feels that his friends "I am not comfortable around me because I have mental illness."  Tyler Horn is insightful regarding his diagnosis.  He reports he is meeting with vocational rehab on Monday so that he can potentially apply for a part-time job, he is excited to be able to work approximately 20 hours/week and continue to receive disability benefits.  He would like to get out of his house more often.  He has been diagnosed with major depressive disorder and generalized anxiety disorder.  He has a history of PTSD.  He is currently followed by outpatient psychiatry through [facility] behavioral health.  He was recently discharged from [facility] behavioral health inpatient psychiatric hospitalization several weeks ago and reports frustration that he was not prescribed benzodiazepine medications.  He reports he would like to be prescribed Xanax today.  Tyler Horn reports he has been compliant with his antidepression medication, Lexapro 40 mg daily.  He reports he has used his olanzapine 10 mg nightly every day however on 2 occasions he has taken 2 olanzapine tablets as he felt like he had increased anxiety.  Discussed importance of only taking olanzapine as prescribed and not adding additional doses, patient verbalizes  understanding.  Patient is assessed face-to-face by nurse practitioner.  He is seated in assessment area, no acute distress.  He is alert and oriented, pleasant and cooperative during assessment.   He reports anxious mood.   He denies suicidal and homicidal ideations. He  denies history of self-harm behaviors.  He contracts verbally for safety with this Clinical research associate.  He has normal speech and behavior.  He denies both auditory and visual hallucinations.  Patient is able to converse coherently with goal-directed thoughts and no distractibility or preoccupation.  He denies paranoia.  Objectively there is no evidence of psychosis/mania or delusional thinking.  Tyler Horn resides in [place], he denies access to weapons.  He denies alcohol and substance use.  He endorses decreased sleep and average appetite.  He reports he uses trazodone to assist with sleep.  Patient offered support and encouragement.  He plans to follow-up with outpatient prescriber at [facility] behavioral health to address concerns regarding medications.  He asked again for prescription for benzodiazepine medication prior to completion of interview. Explained to patient that he could discuss this request with outpatient prescriber, henrik orihuela understanding.    Psychiatric Specialty Exam  Presentation  General Appearance:Appropriate for Environment; Casual  Eye Contact:Good  Speech:Clear and Coherent (patient deaf, using ASL interpreter)  Speech Volume:Other (comment) (interpreter)  Handedness:Right   Mood and Affect  Mood:Anxious  Affect:Appropriate; Congruent   Thought Process  Thought Processes:Coherent; Goal Directed; Linear  Descriptions of Associations:Intact  Orientation:Full (Time, Place and Person)  Thought Content:Logical; WDL  Diagnosis of Schizophrenia or Schizoaffective disorder in past: No   Hallucinations:None NA  Ideas of Reference:None  Suicidal Thoughts:No Without Intent; Without  Plan; Without Means to  Carry Out; Without Access to Means Without Plan; Without Intent; Without Access to Means; Without Means to Carry Out  Homicidal Thoughts:No   Sensorium  Memory:Immediate Good; Recent Good; Remote Good  Judgment:Good  Insight:Fair   Executive Functions  Concentration:Good  Attention Span:Good  Recall:Good  Fund of Knowledge:Good  Language:Good   Psychomotor Activity  Psychomotor Activity:Normal   Assets  Assets:Communication Skills; Desire for Improvement; Financial Resources/Insurance; Housing; Intimacy; Leisure Time; Physical Health; Resilience; Social Support   Sleep  Sleep:Fair  Number of hours: 6.5   No data recorded  Physical Exam: Physical Exam Vitals and nursing note reviewed.  Constitutional:      Appearance: Normal appearance. He is well-developed and normal weight.  HENT:     Head: Normocephalic and atraumatic.     Nose: Nose normal.  Cardiovascular:     Rate and Rhythm: Normal rate.  Pulmonary:     Effort: Pulmonary effort is normal.  Musculoskeletal:        General: Normal range of motion.     Cervical back: Normal range of motion.  Skin:    General: Skin is warm and dry.  Neurological:     Mental Status: He is alert and oriented to person, place, and time.  Psychiatric:        Attention and Perception: Attention and perception normal.        Mood and Affect: Affect normal. Mood is anxious.        Speech: Speech normal.        Behavior: Behavior normal. Behavior is cooperative.        Thought Content: Thought content normal.        Cognition and Memory: Cognition and memory normal.        Judgment: Judgment normal.   Review of Systems  Constitutional: Negative.   HENT: Negative.    Eyes: Negative.   Respiratory: Negative.    Cardiovascular: Negative.   Gastrointestinal: Negative.   Genitourinary: Negative.   Musculoskeletal: Negative.   Skin: Negative.   Neurological: Negative.   Endo/Heme/Allergies:  Negative.   Psychiatric/Behavioral:  The patient is nervous/anxious.   Blood pressure 130/90, pulse 98, temperature 98.1 F (36.7 C), temperature source Oral, resp. rate 18, SpO2 96 %. There is no height or weight on file to calculate BMI.  Musculoskeletal: Strength & Muscle Tone: within normal limits Gait & Station: normal Patient leans: N/A   [facility] MSE Discharge Disposition for Follow up and Recommendations: Based on my evaluation the patient does not appear to have an emergency medical condition and can be discharged with resources and follow up care in outpatient services for Medication Management and Individual Therapy Patient reviewed with Dr. Bronwen Betters. Follow-up with outpatient psychiatry, established with [facility] behavioral health outpatient.   Lenard Lance, FNP 06/25/2021, 11:01 AM

## 2021-06-25 NOTE — Discharge Instructions (Addendum)

## 2021-06-25 NOTE — Progress Notes (Signed)
   06/25/21 0934  [facility] Triage Screening (Walk-ins at [facility] only)  How Did You Hear About [place]? Self  What Is the Reason for Your Visit/Call Today? Mead 60 year old male presents to the [facility] (routine) with complaints that his medication is not working. Patient is seen upstairs by Link Snuffer. Report he has not been sleeping and has not slept in the past 3-days and been very anxious. Report he feels that his medication Lexapro and olanzapine is not working. Report needs medication to assist with sleep. Pt denied SI/HI and denied AVH.  Language interpreter [phone] used during triage, patient uses sign language as communication.  How Long Has This Been Causing You Problems? 1 wk - 1 month  Have You Recently Had Any Thoughts About Hurting Yourself? No  Are You Planning to Commit Suicide/Harm Yourself At This time? No  Have you Recently Had Thoughts About Hurting Someone Karolee Ohs? No  Are You Planning To Harm Someone At This Time? No  Are you currently experiencing any auditory, visual or other hallucinations? No  Have You Used Any Alcohol or Drugs in the Past 24 Hours? No  Do you have any current medical co-morbidities that require immediate attention? No  Clinician description of patient physical appearance/behavior: Patient presents anxious and fidgetty. Report he has been anxious due to the inability to sleep.  What Do You Feel Would Help You the Most Today? Medication(s)  If access to [facility] was not available, would you have sought care in the Emergency Department? No  Determination of Need Routine (7 days)  Options For Referral Medication Management

## 2021-06-26 ENCOUNTER — Telehealth (HOSPITAL_COMMUNITY): Payer: Self-pay | Admitting: Family

## 2021-06-26 NOTE — BH Assessment (Signed)
  Care Management - Follow Up [facility] Discharges   Writer attempted to make contact with patient today and was unsuccessful.   Phone just rang.  Unable to leave a message.  Per chart review, pt has a appt with Link Snuffer, NP for medication management on 08-04-21

## 2021-06-29 ENCOUNTER — Encounter (HOSPITAL_COMMUNITY): Payer: Self-pay

## 2021-06-29 ENCOUNTER — Other Ambulatory Visit: Payer: Self-pay

## 2021-06-29 ENCOUNTER — Emergency Department (HOSPITAL_COMMUNITY)
Admission: EM | Admit: 2021-06-29 | Discharge: 2021-06-29 | Disposition: A | Discharge status: Home / Self Care | Payer: Self-pay | Attending: Emergency Medicine | Admitting: Emergency Medicine

## 2021-06-29 DIAGNOSIS — F32A Depression, unspecified: Secondary | ICD-10-CM

## 2021-06-29 DIAGNOSIS — R251 Tremor, unspecified: Secondary | ICD-10-CM | POA: Insufficient documentation

## 2021-06-29 DIAGNOSIS — Z20822 Contact with and (suspected) exposure to covid-19: Secondary | ICD-10-CM | POA: Insufficient documentation

## 2021-06-29 DIAGNOSIS — F22 Delusional disorders: Secondary | ICD-10-CM | POA: Insufficient documentation

## 2021-06-29 DIAGNOSIS — F419 Anxiety disorder, unspecified: Secondary | ICD-10-CM

## 2021-06-29 DIAGNOSIS — R000 Tachycardia, unspecified: Secondary | ICD-10-CM | POA: Insufficient documentation

## 2021-06-29 DIAGNOSIS — G4700 Insomnia, unspecified: Secondary | ICD-10-CM

## 2021-06-29 DIAGNOSIS — Z79899 Other long term (current) drug therapy: Secondary | ICD-10-CM | POA: Insufficient documentation

## 2021-06-29 DIAGNOSIS — Z21 Asymptomatic human immunodeficiency virus [HIV] infection status: Secondary | ICD-10-CM | POA: Insufficient documentation

## 2021-06-29 DIAGNOSIS — F332 Major depressive disorder, recurrent severe without psychotic features: Secondary | ICD-10-CM | POA: Insufficient documentation

## 2021-06-29 LAB — RAPID URINE DRUG SCREEN, HOSP PERFORMED
Amphetamines: NOT DETECTED
Barbiturates: NOT DETECTED
Benzodiazepines: NOT DETECTED
Cocaine: NOT DETECTED
Opiates: NOT DETECTED
Tetrahydrocannabinol: NOT DETECTED

## 2021-06-29 LAB — ACETAMINOPHEN LEVEL
Acetaminophen (Tylenol), Serum: 26 ug/mL (ref 10–30)
Acetaminophen (Tylenol), Serum: 15 ug/mL (ref 10–30)

## 2021-06-29 LAB — CBC WITH DIFFERENTIAL/PLATELET
Abs Immature Granulocytes: 0.06 10*3/uL (ref 0.00–0.07)
Basophils Absolute: 0.1 10*3/uL (ref 0.0–0.1)
Basophils Relative: 1 %
Eosinophils Absolute: 0.2 10*3/uL (ref 0.0–0.5)
Eosinophils Relative: 1 %
HCT: 56.3 % — ABNORMAL HIGH (ref 39.0–52.0)
Hemoglobin: 19 g/dL — ABNORMAL HIGH (ref 13.0–17.0)
Immature Granulocytes: 1 %
Lymphocytes Relative: 31 %
Lymphs Abs: 3.7 10*3/uL (ref 0.7–4.0)
MCH: 31.5 pg (ref 26.0–34.0)
MCHC: 33.7 g/dL (ref 30.0–36.0)
MCV: 93.2 fL (ref 80.0–100.0)
Monocytes Absolute: 0.9 10*3/uL (ref 0.1–1.0)
Monocytes Relative: 7 %
Neutro Abs: 7.2 10*3/uL (ref 1.7–7.7)
Neutrophils Relative %: 59 %
Platelets: 489 10*3/uL — ABNORMAL HIGH (ref 150–400)
RBC: 6.04 MIL/uL — ABNORMAL HIGH (ref 4.22–5.81)
RDW: 13.8 % (ref 11.5–15.5)
WBC: 12.1 10*3/uL — ABNORMAL HIGH (ref 4.0–10.5)
nRBC: 0 % (ref 0.0–0.2)

## 2021-06-29 LAB — COMPREHENSIVE METABOLIC PANEL
ALT: 16 U/L (ref 0–44)
AST: 17 U/L (ref 15–41)
Albumin: 4.9 g/dL (ref 3.5–5.0)
Alkaline Phosphatase: 138 U/L — ABNORMAL HIGH (ref 38–126)
Anion gap: 13 (ref 5–15)
BUN: 22 mg/dL — ABNORMAL HIGH (ref 6–20)
CO2: 23 mmol/L (ref 22–32)
Calcium: 9.5 mg/dL (ref 8.9–10.3)
Chloride: 102 mmol/L (ref 98–111)
Creatinine, Ser: 1.58 mg/dL — ABNORMAL HIGH (ref 0.61–1.24)
GFR, Estimated: 50 mL/min — ABNORMAL LOW (ref >60)
Glucose, Bld: 127 mg/dL — ABNORMAL HIGH (ref 70–99)
Potassium: 3.8 mmol/L (ref 3.5–5.1)
Sodium: 138 mmol/L (ref 135–145)
Total Bilirubin: 0.7 mg/dL (ref 0.3–1.2)
Total Protein: 9.1 g/dL — ABNORMAL HIGH (ref 6.5–8.1)

## 2021-06-29 LAB — RESP PANEL BY RT-PCR (FLU A&B, COVID) ARPGX2
Influenza A by PCR: NEGATIVE
Influenza B by PCR: NEGATIVE
SARS Coronavirus 2 by RT PCR: NEGATIVE

## 2021-06-29 LAB — ETHANOL: Alcohol, Ethyl (B): <10 mg/dL (ref <10)

## 2021-06-29 LAB — SALICYLATE LEVEL: Salicylate Lvl: <7 mg/dL — ABNORMAL LOW (ref 7.0–30.0)

## 2021-06-29 MED ORDER — OLANZAPINE 10 MG PO TBDP
10.0000 mg | ORAL_TABLET | Freq: Once | ORAL | Status: AC
  Administered 2021-06-29: 10 mg via ORAL
  Filled 2021-06-29: qty 1

## 2021-06-29 MED ORDER — LORAZEPAM 1 MG PO TABS
1.0000 mg | ORAL_TABLET | Freq: Once | ORAL | Status: AC
  Administered 2021-06-29: 1 mg via ORAL
  Filled 2021-06-29: qty 1

## 2021-06-29 MED ORDER — ESCITALOPRAM OXALATE 10 MG PO TABS
20.0000 mg | ORAL_TABLET | Freq: Every day | ORAL | Status: DC
  Administered 2021-06-29: 20 mg via ORAL
  Filled 2021-06-29: qty 2

## 2021-06-29 NOTE — ED Provider Notes (Signed)
[facility] DEPT Provider Note   CSN: [id number] Arrival date & time: 06/29/21  0530     History Chief Complaint  Patient presents with   Anxiety    Tyler Horn is a 60 y.o. male.   Patient is a 60 year old male with a history of deafness and uses American sign language, insomnia, major depressive disorder with psychotic features, panic attacks, and generalized anxiety disorder who has been seen multiple times by behavioral health urgent care over the last month for worsening insomnia and was recently given olanzapine to take in addition to Lexapro.  He reports that he has been out of his medication for the last week because he took the medication improperly.  He reported he did not do this to kill himself but he must have mixed up how he was supposed to take them.  He does not have an appointment to see his NP with psychiatry until 6 December and does not feel like he can wait that long.  He is feeling very anxious and also feels that he is very depressed and hopeless.  He has also not been able to sleep in the last 2 to 3 days.  He is feeling racing thoughts, occasionally having visual hallucinations and feels like he just cannot calm down.  He denies any drug or alcohol use.  He did call his behavioral health provider telling them that he was out of the medication and he had no refills and they were concerned he was trying to overdose on the medications and refused to refill them.  He reports he has not had any suicidal or homicidal thoughts.  He has not taken the medication purposely to overdose or hurt himself.  He came here today because he did not know what else to do and he was feeling worse and worse.  He feels that he needs to talk with a mental health provider.  The history is provided by the patient and medical records. The history is limited by a language barrier. A language interpreter was used.  Anxiety      Past Medical History:  Diagnosis  Date   Anxiety    Chronic hepatitis C ([place]) 04/27/2020   Depression    HIV (human immunodeficiency virus infection) ([place])     Patient Active Problem List   Diagnosis Date Noted   MDD (major depressive disorder) 05/05/2021   Insomnia due to other mental disorder 02/19/2021   Panic disorder 01/07/2021   Severe episode of recurrent major depressive disorder, with psychotic features ([place]) 10/16/2020   Vision changes 07/09/2020   Weight loss 07/09/2020   Idiopathic peripheral neuropathy 04/27/2020   Herpes simplex 04/27/2020   Herpes zoster 04/27/2020   Healthcare maintenance 12/19/2019   Early syphilis, 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   Post-traumatic stress disorder, unspecified 11/28/2019   Sedative, 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   MDD (major depressive disorder), recurrent severe, without psychosis ([place]) 07/13/2017   Major depressive disorder, recurrent, severe with psychotic features ([place]) 07/13/2017   MDD (major depressive disorder), recurrent, severe, with psychosis ([place]) 07/31/2014   Benzodiazepine misuse 07/31/2014   Human immunodeficiency virus infection ([place])    Delusional disorder ([place]) 07/30/2014   Suicidal thoughts 07/30/2014   Visual hallucinations    Generalized anxiety disorder with panic attacks 03/26/2013    Past Surgical History:  Procedure Laterality Date   CATARACT EXTRACTION Left        Family History  Problem Relation Age of Onset  Mental illness Mother    Bipolar disorder Brother     Social History   Tobacco Use   Smoking status: Never   Smokeless tobacco: Never  Vaping Use   Vaping Use: Never used  Substance Use Topics   Alcohol use: No   Drug use: No    Home Medications Prior to Admission medications   Medication Sig Start Date End Date Taking? Authorizing Provider  bictegravir-emtricitabine-tenofovir AF (BIKTARVY) 50-200-25 MG TABS tablet Take 1 tablet by mouth daily. 04/14/21  Yes  Golden Circle, FNP  cholecalciferol (VITAMIN D3) 25 MCG (1000 UNIT) tablet Take 1,000 Units by mouth daily.   Yes [provider]  escitalopram (LEXAPRO) 20 MG tablet Take 1 tablet (20 mg total) by mouth daily. Patient to take 1 tablet (20 mg total) for 6 days followed by 2 tablets (40 mg total) daily Patient taking differently: Take 40 mg by mouth daily. 06/09/21 06/09/22 Yes Nwoko, Isidoro Donning E, PA  gabapentin (NEURONTIN) 100 MG capsule Take 1 capsule (100 mg total) by mouth 3 (three) times daily. 06/08/21  Yes Truddie Hidden, MD  OLANZapine (ZYPREXA) 10 MG tablet Take 1 tablet (10 mg total) by mouth at bedtime. 06/09/21 06/09/22 Yes Nwoko, Terese Door, PA  Omega-3 Fatty Acids (FISH OIL) 1000 MG CPDR Take 1,000 mg by mouth daily.   Yes [provider]  tamsulosin (FLOMAX) 0.4 MG CAPS capsule Take 0.4 mg by mouth daily.   Yes [provider]  LORazepam (ATIVAN) 1 MG tablet Take 1 tablet (1 mg total) by mouth 3 (three) times daily as needed for anxiety. Patient not taking: No sig reported 06/08/21   Truddie Hidden, MD    Allergies    Patient has no known allergies.  Review of Systems   Review of Systems  All other systems reviewed and are negative.  Physical Exam Updated Vital Signs BP 119/63   Pulse (!) 109   Temp 98.2 F (36.8 C) (Oral)   Resp 20   SpO2 93%   Physical Exam Vitals and nursing note reviewed.  Constitutional:      General: He is not in acute distress.    Appearance: He is well-developed.     Comments: Anxious appearing  HENT:     Head: Normocephalic and atraumatic.     Mouth/Throat:     Mouth: Mucous membranes are dry.  Eyes:     Conjunctiva/sclera: Conjunctivae normal.     Pupils: Pupils are equal, round, and reactive to light.  Cardiovascular:     Rate and Rhythm: Regular rhythm. Tachycardia present.     Heart sounds: No murmur heard. Pulmonary:     Effort: Pulmonary effort is normal. No respiratory distress.     Breath  sounds: Normal breath sounds. No wheezing or rales.  Abdominal:     General: There is no distension.     Palpations: Abdomen is soft.     Tenderness: There is no abdominal tenderness. There is no guarding or rebound.  Musculoskeletal:        General: No tenderness. Normal range of motion.     Cervical back: Normal range of motion and neck supple.  Skin:    General: Skin is warm and dry.     Findings: No erythema or rash.  Neurological:     Mental Status: He is alert and oriented to person, place, and time. Mental status is at baseline.     Sensory: No sensory deficit.     Motor: No weakness.  Comments: Mild tremor in the upper extremities.  No tongue fasciculations  Psychiatric:        Attention and Perception: Attention normal.        Mood and Affect: Mood is anxious.        Speech: Speech is rapid and pressured.        Behavior: Behavior is cooperative.        Thought Content: Thought content is paranoid. Thought content does not include homicidal or suicidal ideation.     Comments: He also reports feeling paranoid like someone is out to get him    ED Results / Procedures / Treatments   Labs (all labs ordered are listed, but only abnormal results are displayed) Labs Reviewed  CBC WITH DIFFERENTIAL/PLATELET - Abnormal; Notable for the following components:      Result Value   WBC 12.1 (*)    RBC 6.04 (*)    Hemoglobin 19.0 (*)    HCT 56.3 (*)    Platelets 489 (*)    All other components within normal limits  COMPREHENSIVE METABOLIC PANEL - Abnormal; Notable for the following components:   Glucose, Bld 127 (*)    BUN 22 (*)    Creatinine, Ser 1.58 (*)    Total Protein 9.1 (*)    Alkaline Phosphatase 138 (*)    GFR, Estimated 50 (*)    All other components within normal limits  SALICYLATE LEVEL - Abnormal; Notable for the following components:   Salicylate Lvl [id number] (*)    All other components within normal limits  RESP PANEL BY RT-PCR (FLU A&B, COVID) ARPGX2  RAPID  URINE DRUG SCREEN, HOSP PERFORMED  ACETAMINOPHEN LEVEL  ETHANOL    EKG EKG Interpretation  Date/Time:  Monday June 29 2021 05:43:19 EDT Ventricular Rate:  116 PR Interval:  142 QRS Duration: 104 QT Interval:  339 QTC Calculation: 457 R Axis:   95 Text Interpretation: Sinus tachycardia Paired ventricular premature complexes Right atrial enlargement Right axis deviation Consider anterior infarct Borderline ST elevation, lateral leads No significant change since last tracing Confirmed by Blanchie Dessert [phone]) on 06/29/2021 7:25:21 AM  Radiology No results found.  Procedures Procedures   Medications Ordered in ED Medications  escitalopram (LEXAPRO) tablet 20 mg (20 mg Oral Not Given 06/29/21 0929)  OLANZapine zydis (ZYPREXA) disintegrating tablet 10 mg (10 mg Oral Given 06/29/21 [id number])    ED Course  I have reviewed the triage vital signs and the nursing notes.  Pertinent labs & imaging results that were available during my care of the patient were reviewed by me and considered in my medical decision making (see chart for details).    MDM Rules/Calculators/A&P                           61 year old male with known history of anxiety and depression as well as insomnia who had recently started on Lexapro and Zyprexa but reports had run out of the medication 1 week ago and having worsening symptoms.  He denies suicidal or homicidal ideation but appears very anxious at this time.  He was given a dose of his behavioral health medications but is requesting to see somebody from behavioral health.  He does have a nurse practitioner who he sees but does not have an appointment till December.  He also has no prescriptions for his medications.  He is cooperative at this time but is mildly tachycardic and appears to have a very dry mouth.  He is not taking any over-the-counter medications but did have a Tylenol level of 26.  This was drawn at 5:54 AM.  We will repeat just to ensure no  increased levels although patient denies overdosing on anything.  Reactine is at baseline without acute abnormalities in his labs.  Patient appears medically clear at this time and we will have TTS evaluate.  MDM   Amount and/or Complexity of Data Reviewed Clinical lab tests: ordered and reviewed Independent visualization of images, tracings, or specimens: yes    Final Clinical Impression(s) / ED Diagnoses Final diagnoses:  Anxiety  Depression, unspecified depression type  Insomnia, unspecified type    Rx / DC Orders ED Discharge Orders     None        Gwyneth Sprout, MD 06/29/21 0945

## 2021-06-29 NOTE — BH Assessment (Addendum)
@  1720, requested patient's nurse Lindie Spruce, RN) and charge nurse Kennyth Arnold, RN) to set up the TTS machine for patient's initial TTS visit. Charge is busy at this time. Requested charge nurse/nursing to let TTS know when patient is ready to be seen.   @1729 , per charge, nursing is setting up machine for patient to be seen.
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
## 2021-06-29 NOTE — ED Triage Notes (Signed)
Pt is deaf and uses ASL. Pt reports with anxiety and states that he is unwell. Pt states that he has not had his Lexapro or Seroquel in a week.

## 2021-07-01 ENCOUNTER — Encounter (HOSPITAL_COMMUNITY): Payer: Self-pay

## 2021-07-01 ENCOUNTER — Emergency Department (HOSPITAL_COMMUNITY)
Admission: EM | Admit: 2021-07-01 | Discharge: 2021-07-02 | Disposition: A | Discharge status: Home / Self Care | Payer: Self-pay | Attending: Emergency Medicine | Admitting: Emergency Medicine

## 2021-07-01 DIAGNOSIS — F410 Panic disorder [episodic paroxysmal anxiety] without agoraphobia: Secondary | ICD-10-CM | POA: Diagnosis present

## 2021-07-01 DIAGNOSIS — R251 Tremor, unspecified: Secondary | ICD-10-CM | POA: Insufficient documentation

## 2021-07-01 DIAGNOSIS — Y900 Blood alcohol level of less than 20 mg/100 ml: Secondary | ICD-10-CM | POA: Insufficient documentation

## 2021-07-01 DIAGNOSIS — Z21 Asymptomatic human immunodeficiency virus [HIV] infection status: Secondary | ICD-10-CM | POA: Insufficient documentation

## 2021-07-01 DIAGNOSIS — R000 Tachycardia, unspecified: Secondary | ICD-10-CM | POA: Insufficient documentation

## 2021-07-01 DIAGNOSIS — F419 Anxiety disorder, unspecified: Secondary | ICD-10-CM | POA: Insufficient documentation

## 2021-07-01 DIAGNOSIS — Z79899 Other long term (current) drug therapy: Secondary | ICD-10-CM | POA: Insufficient documentation

## 2021-07-01 LAB — CBC WITH DIFFERENTIAL/PLATELET
Abs Immature Granulocytes: 0.1 10*3/uL — ABNORMAL HIGH (ref 0.00–0.07)
Basophils Absolute: 0.1 10*3/uL (ref 0.0–0.1)
Basophils Relative: 0 %
Eosinophils Absolute: 0 10*3/uL (ref 0.0–0.5)
Eosinophils Relative: 0 %
HCT: 53.6 % — ABNORMAL HIGH (ref 39.0–52.0)
Hemoglobin: 18 g/dL — ABNORMAL HIGH (ref 13.0–17.0)
Immature Granulocytes: 1 %
Lymphocytes Relative: 7 %
Lymphs Abs: 1.3 10*3/uL (ref 0.7–4.0)
MCH: 31.6 pg (ref 26.0–34.0)
MCHC: 33.6 g/dL (ref 30.0–36.0)
MCV: 94.2 fL (ref 80.0–100.0)
Monocytes Absolute: 0.9 10*3/uL (ref 0.1–1.0)
Monocytes Relative: 5 %
Neutro Abs: 15.6 10*3/uL — ABNORMAL HIGH (ref 1.7–7.7)
Neutrophils Relative %: 87 %
Platelets: 358 10*3/uL (ref 150–400)
RBC: 5.69 MIL/uL (ref 4.22–5.81)
RDW: 13.3 % (ref 11.5–15.5)
WBC: 17.9 10*3/uL — ABNORMAL HIGH (ref 4.0–10.5)
nRBC: 0 % (ref 0.0–0.2)

## 2021-07-01 LAB — COMPREHENSIVE METABOLIC PANEL
ALT: 11 U/L (ref 0–44)
AST: 21 U/L (ref 15–41)
Albumin: 4.8 g/dL (ref 3.5–5.0)
Alkaline Phosphatase: 127 U/L — ABNORMAL HIGH (ref 38–126)
Anion gap: 12 (ref 5–15)
BUN: 8 mg/dL (ref 6–20)
CO2: 19 mmol/L — ABNORMAL LOW (ref 22–32)
Calcium: 9.4 mg/dL (ref 8.9–10.3)
Chloride: 104 mmol/L (ref 98–111)
Creatinine, Ser: 0.99 mg/dL (ref 0.61–1.24)
GFR, Estimated: >60 mL/min (ref >60)
Glucose, Bld: 111 mg/dL — ABNORMAL HIGH (ref 70–99)
Potassium: 4.3 mmol/L (ref 3.5–5.1)
Sodium: 135 mmol/L (ref 135–145)
Total Bilirubin: 0.7 mg/dL (ref 0.3–1.2)
Total Protein: 8.8 g/dL — ABNORMAL HIGH (ref 6.5–8.1)

## 2021-07-01 LAB — ACETAMINOPHEN LEVEL: Acetaminophen (Tylenol), Serum: <10 ug/mL — ABNORMAL LOW (ref 10–30)

## 2021-07-01 LAB — ETHANOL: Alcohol, Ethyl (B): <10 mg/dL (ref <10)

## 2021-07-01 MED ORDER — OLANZAPINE 10 MG PO TABS
10.0000 mg | ORAL_TABLET | Freq: Every day | ORAL | Status: DC
  Administered 2021-07-01: 10 mg via ORAL
  Filled 2021-07-01: qty 1

## 2021-07-01 MED ORDER — BICTEGRAVIR-EMTRICITAB-TENOFOV 50-200-25 MG PO TABS
1.0000 | ORAL_TABLET | Freq: Every day | ORAL | Status: DC
  Administered 2021-07-01 – 2021-07-02 (×2): 1 via ORAL
  Filled 2021-07-01 (×2): qty 1

## 2021-07-01 MED ORDER — LORAZEPAM 1 MG PO TABS
1.0000 mg | ORAL_TABLET | Freq: Four times a day (QID) | ORAL | Status: DC | PRN
  Administered 2021-07-01 – 2021-07-02 (×3): 1 mg via ORAL
  Filled 2021-07-01 (×4): qty 1

## 2021-07-01 MED ORDER — ESCITALOPRAM OXALATE 10 MG PO TABS
40.0000 mg | ORAL_TABLET | Freq: Every day | ORAL | Status: DC
  Administered 2021-07-01 – 2021-07-02 (×2): 40 mg via ORAL
  Filled 2021-07-01 (×2): qty 4

## 2021-07-01 MED ORDER — GABAPENTIN 100 MG PO CAPS
100.0000 mg | ORAL_CAPSULE | Freq: Three times a day (TID) | ORAL | Status: DC
  Administered 2021-07-01 – 2021-07-02 (×3): 100 mg via ORAL
  Filled 2021-07-01 (×3): qty 1

## 2021-07-01 NOTE — ED Notes (Signed)
Pt felt agitated and walked away from the room where he was waiting to do the TTS consult

## 2021-07-01 NOTE — BH Assessment (Addendum)
@  1636, requested patient's nurse Duwayne Heck, RN) and charge nurse Kennyth Arnold, RN) to set up the TTS machine for patient's initial TTS assessment/provider assessment. Verner Mould will notify TTS when patient is ready to bee seen.

## 2021-07-01 NOTE — Consult Note (Signed)
Telepsych Consultation   Reason for Consult:  psychiatric evaluation Referring Physician:  [name] Location of Patient: [facility] Location of Provider: [facility]  Patient Identification: Tyler Horn MRN:  [id number] Principal Diagnosis: Generalized anxiety disorder with panic attacks Diagnosis:  Principal Problem:   Generalized anxiety disorder with panic attacks   Total Time spent with patient: 30 minutes  Subjective:   Tyler Horn is a 60 y.o. male patient admitted with anxiety. This patient was last assessed by TTS counselor on 10/31: Clinician met with patient via teleassessment on this day. States, "I need to make some adjustments to medications, Lexapro, Olanzapine, Ativan as needed". Patient states that he was given Ativan in the Emergency Department, "It really helped me", "It was so amazing", and "Really helped me a lot". States that if the provider in the ED is unwilling to give him more medications, he will just show up at the [facility] tomorrow.  Patient reports making a mistake and taking Olanzapine plus his Seroquel at the same time. He realizes that he should have never done that, "It's dangerous". He also took Ativan before heading to the Emergency room, states that he was given more in the ED, and hopes to be given some to take home with him today. Patient states, "I feel my anxiety coming back, just a little to take home would be helpful". He is additional requesting for Xanax but states if it's to much he understands.  Patient states that his last visit with his provider was [name] [place], [place]) was October 11, his prescription was sent to [facility]. He reports filling his prescription, doubling up on his medications, and as the result has doubled up on his medications. States that he was confused about the dosages and quantity of medications to take. Therefore, leading to him overtaking his medications. Patient hopes to connect with an ACTT provider in the community to  assist him with medication management.    On 10/31 this patient was advised to see [facility], NP for walk-in hours at [facility] on  Wednesday, 11/2, Thursday, 11/3, or Friday, 11/4 for medication management as [facility] providers do not prescribe benzodiazepines from the emergency department.    HPI:  Today, patient returns to the emergency department with c/o of anxiety. Upon arrival to the ED, he received Biktarvy, Lexapro, Neurontin, and Ativan.   Tyler Horn is 60 year old male seen by this provider via tele psych with ASL interpreter at bedside.  Patient is sitting on side of bed extremely restless, frequently grabbing his head with both hands and saying that he is not safe and that he needs something stronger for his anxiety.  When asked about whether he went for medication management as a walk-in today, he kept repeating that "they told me I cannot come back until 12/6"  This provider does not see record that he went to the [facility] today. Patient told ASL interpreter that he only comes to [facility] for his care and does not remember seeing his provider on 10/11 at [facility].   Endorses suicidal ideation without plan, repeats "I am not safe". Endorses hearing voices that tell him to hurt himself and says he is seeing ghost. Denies homicidal ideation. Unable to contract for safety.   Past Psychiatric History:   Risk to Self:  yes Risk to Others:   Prior Inpatient Therapy:   Prior Outpatient Therapy:    Past Medical History:  Past Medical History:  Diagnosis Date   Anxiety    Chronic  hepatitis C ([place]) 04/27/2020   Depression    HIV (human immunodeficiency virus infection) ([place])     Past Surgical History:  Procedure Laterality Date   CATARACT EXTRACTION Left    Family History:  Family History  Problem Relation Age of Onset   Mental illness Mother    Bipolar disorder Brother    Family Psychiatric  History:  Social History:  Social History   Substance and Sexual Activity  Alcohol Use No      Social History   Substance and Sexual Activity  Drug Use No    Social History   Socioeconomic History   Marital status: Single    Spouse name: Not on file   Number of children: Not on file   Years of education: Not on file   Highest education level: Not on file  Occupational History   Not on file  Tobacco Use   Smoking status: Never   Smokeless tobacco: Never  Vaping Use   Vaping Use: Never used  Substance and Sexual Activity   Alcohol use: No   Drug use: No   Sexual activity: Yes    Comment: pt given condoms  Other Topics Concern   Not on file  Social History Narrative   Not on file   Social Determinants of Health   Financial Resource Strain: Not on file  Food Insecurity: Not on file  Transportation Needs: Not on file  Physical Activity: Not on file  Stress: Not on file  Social Connections: Not on file   Additional Social History:    Allergies:  No Known Allergies  Labs:  Results for orders placed or performed during the hospital encounter of 07/01/21 (from the past 48 hour(s))  Ethanol     Status: None   Collection Time: 07/01/21  3:35 PM  Result Value Ref Range   Alcohol, Ethyl (B) <10 <10 mg/dL    Comment: (NOTE) Lowest detectable limit for serum alcohol is 10 mg/dL.  For medical purposes only. Performed at [facility], [place] [address] [place], [place] [address]   Comprehensive metabolic panel     Status: Abnormal   Collection Time: 07/01/21  3:35 PM  Result Value Ref Range   Sodium 135 135 - 145 mmol/L   Potassium 4.3 3.5 - 5.1 mmol/L   Chloride 104 98 - 111 mmol/L   CO2 19 (L) 22 - 32 mmol/L   Glucose, Bld 111 (H) 70 - 99 mg/dL    Comment: Glucose reference range applies only to samples taken after fasting for at least 8 hours.   BUN 8 6 - 20 mg/dL   Creatinine, Ser 0.99 0.61 - 1.24 mg/dL   Calcium 9.4 8.9 - 10.3 mg/dL   Total Protein 8.8 (H) 6.5 - 8.1 g/dL   Albumin 4.8 3.5 - 5.0 g/dL   AST 21 15 - 41 U/L   ALT 11 0  - 44 U/L   Alkaline Phosphatase 127 (H) 38 - 126 U/L   Total Bilirubin 0.7 0.3 - 1.2 mg/dL   GFR, Estimated >60 >60 mL/min    Comment: (NOTE) Calculated using the CKD-EPI Creatinine Equation (2021)    Anion gap 12 5 - 15    Comment: Performed at [facility], [place] [address], [place], [place] [address]  CBC with Differential/Platelet     Status: Abnormal   Collection Time: 07/01/21  3:35 PM  Result Value Ref Range   WBC 17.9 (H) 4.0 - 10.5 K/uL   RBC  5.69 4.22 - 5.81 MIL/uL   Hemoglobin 18.0 (H) 13.0 - 17.0 g/dL   HCT 53.6 (H) 39.0 - 52.0 %   MCV 94.2 80.0 - 100.0 fL   MCH 31.6 26.0 - 34.0 pg   MCHC 33.6 30.0 - 36.0 g/dL   RDW 13.3 11.5 - 15.5 %   Platelets 358 150 - 400 K/uL   nRBC 0.0 0.0 - 0.2 %   Neutrophils Relative % 87 %   Neutro Abs 15.6 (H) 1.7 - 7.7 K/uL   Lymphocytes Relative 7 %   Lymphs Abs 1.3 0.7 - 4.0 K/uL   Monocytes Relative 5 %   Monocytes Absolute 0.9 0.1 - 1.0 K/uL   Eosinophils Relative 0 %   Eosinophils Absolute 0.0 0.0 - 0.5 K/uL   Basophils Relative 0 %   Basophils Absolute 0.1 0.0 - 0.1 K/uL   Immature Granulocytes 1 %   Abs Immature Granulocytes 0.10 (H) 0.00 - 0.07 K/uL    Comment: Performed at [facility], [place] [address], [place], [place] [address]  Acetaminophen level     Status: Abnormal   Collection Time: 07/01/21  3:35 PM  Result Value Ref Range   Acetaminophen (Tylenol), Serum <10 (L) 10 - 30 ug/mL    Comment: (NOTE) Therapeutic concentrations vary significantly. A range of 10-30 ug/mL  may be an effective concentration for many patients. However, some  are best treated at concentrations outside of this range. Acetaminophen concentrations >150 ug/mL at 4 hours after ingestion  and >50 ug/mL at 12 hours after ingestion are often associated with  toxic reactions.  Performed at [facility], [place] [address] [place], [place] [address]     Medications:  Current  Facility-Administered Medications  Medication Dose Route Frequency Provider Last Rate Last Admin   bictegravir-emtricitabine-tenofovir AF (BIKTARVY) 50-200-25 MG per tablet 1 tablet  1 tablet Oral Daily Fredia Sorrow, MD   1 tablet at 07/01/21 1709   escitalopram (LEXAPRO) tablet 40 mg  40 mg Oral Daily Fredia Sorrow, MD   40 mg at 07/01/21 1710   gabapentin (NEURONTIN) capsule 100 mg  100 mg Oral TID Fredia Sorrow, MD   100 mg at 07/01/21 1710   LORazepam (ATIVAN) tablet 1 mg  1 mg Oral Q6H PRN Fredia Sorrow, MD   1 mg at 07/01/21 1710   OLANZapine (ZYPREXA) tablet 10 mg  10 mg Oral QHS Fredia Sorrow, MD       Current Outpatient Medications  Medication Sig Dispense Refill   bictegravir-emtricitabine-tenofovir AF (BIKTARVY) 50-200-25 MG TABS tablet Take 1 tablet by mouth daily. 30 tablet 5   cholecalciferol (VITAMIN D3) 25 MCG (1000 UNIT) tablet Take 1,000 Units by mouth daily.     escitalopram (LEXAPRO) 20 MG tablet Take 1 tablet (20 mg total) by mouth daily. Patient to take 1 tablet (20 mg total) for 6 days followed by 2 tablets (40 mg total) daily (Patient taking differently: Take 40 mg by mouth daily.) 30 tablet 1   gabapentin (NEURONTIN) 100 MG capsule Take 1 capsule (100 mg total) by mouth 3 (three) times daily. 3 capsule 0   LORazepam (ATIVAN) 1 MG tablet Take 1 tablet (1 mg total) by mouth 3 (three) times daily as needed for anxiety. (Patient not taking: No sig reported) 3 tablet 0   OLANZapine (ZYPREXA) 10 MG tablet Take 1 tablet (10 mg total) by mouth at bedtime. 30 tablet 1   Omega-3 Fatty Acids (FISH OIL) 1000 MG CPDR Take 1,000  mg by mouth daily.     tamsulosin (FLOMAX) 0.4 MG CAPS capsule Take 0.4 mg by mouth daily.      Musculoskeletal: Strength & Muscle Tone: within normal limits Gait & Station: normal Patient leans: N/A          Psychiatric Specialty Exam:  Presentation  General Appearance: Appropriate for Environment  Eye  Contact:Good  Speech:Clear and Coherent  Speech Volume:Other (comment) (deaf)  Handedness:Right   Mood and Affect  Mood:Anxious  Affect:Congruent   Thought Process  Thought Processes:Coherent  Descriptions of Associations:Intact  Orientation:Full (Time, Place and Person)  Thought Content:Logical  History of Schizophrenia/Schizoaffective disorder:No  Duration of Psychotic Symptoms:N/A  Hallucinations:Hallucinations: Auditory; Visual Description of Auditory Hallucinations: telling him to hurt himself Description of Visual Hallucinations: seeing ghost Ideas of Reference:None  Suicidal Thoughts:Suicidal Thoughts: Yes, Passive SI Passive Intent and/or Plan: Without Intent; Without Plan Homicidal Thoughts:Homicidal Thoughts: No  Sensorium  Memory:Immediate Poor; Recent Poor; Remote Fair  Judgment:Impaired  Insight:Fair   Executive Functions  Concentration:Fair  Attention Span:Fair  [place]   Psychomotor Activity  Psychomotor Activity:Psychomotor Activity: Increased  Assets  Assets:Desire for Improvement   Sleep  Sleep:Sleep: Fair   Physical Exam: Physical Exam Vitals reviewed.  Cardiovascular:     Rate and Rhythm: Tachycardia present.  Pulmonary:     Effort: Pulmonary effort is normal.  Neurological:     Mental Status: He is alert and oriented to person, place, and time.  Psychiatric:        Attention and Perception: He perceives auditory and visual hallucinations.        Mood and Affect: Mood is anxious.        Behavior: Behavior is agitated.        Thought Content: Thought content is not paranoid or delusional. Thought content includes suicidal ideation. Thought content does not include homicidal ideation. Thought content does not include homicidal or suicidal plan.   ROS Blood pressure (!) 152/93, pulse (!) 124, temperature 98.7 F (37.1 C), temperature source Oral, resp. rate (!) 22, SpO2 96  %. There is no height or weight on file to calculate BMI.  Treatment Plan Summary: Daily contact with patient to assess and evaluate symptoms and progress in treatment, Medication management, and Plan overnight observation with reassessment by psychiatry in the morning.   Disposition: Not safe for discharge tonight, will remain in the ED for monitoring and stabilization and be reassessed by psychiatry in the morning. [name] aware of this plan and will continue with Ativan Q 6 hours as needed for anxiety.   This service was provided via telemedicine using a 2-way, interactive audio and video technology.  Names of all persons participating in this telemedicine service and their role in this encounter. Name: Nigil Braman Role: patient  Name: [name] Role: NP  Name: ASL interpreter Role: interpreter    Chalmers Guest, NP 07/01/2021 6:04 PM

## 2021-07-01 NOTE — ED Provider Notes (Signed)
[facility] DEPT Provider Note   CSN: [id number] Arrival date & time: 07/01/21  1033     History Chief Complaint  Patient presents with   Tremors   Anxiety    Hager Zima is a 60 y.o. male.  Patient returns today for very similar complaints to what he had on October 31.  Patient seen multiple times for similar concerns about his medication behavioral health issues and anxiety.  Patient actually seen October 27 October 8 September 6 September 4 and August 7.  Patient was evaluated by behavioral health on October 31.  It appeared that they had addressed the medication concerns.  Patient does not think he has any medications or cannot get them till December 6.  Currently not suicidal.  But he says at times he is.  He wants to talk to behavioral health again.  Past medical history is significant for chronic hepatitis C HIV infection.  Patient states that his gait has been offkilter and he has fallen but he did not injure anything.  He has tremors and is complaining of significant anxiety.  Patient is on antivirals.  Not clear whether he is taking them or not but he says he is.  But seems to have a lot of confusion about his other medications.  Psychiatric history is significant for major depressive disorder insomnia panic disorder.  Patient has prescription at least in the past for Zyprexa and Neurontin Lexapro.  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      Past Medical History:  Diagnosis Date   Anxiety    Chronic hepatitis C ([place]) 04/27/2020   Depression    HIV (human immunodeficiency virus infection) ([place])     Patient Active Problem List   Diagnosis Date Noted   MDD (major depressive disorder) 05/05/2021   Insomnia due to other mental disorder 02/19/2021   Panic disorder 01/07/2021   Severe episode of recurrent major depressive disorder, with psychotic features ([place]) 10/16/2020   Vision changes 07/09/2020   Weight  loss 07/09/2020   Idiopathic peripheral neuropathy 04/27/2020   Herpes simplex 04/27/2020   Herpes zoster 04/27/2020   Healthcare maintenance 12/19/2019   Early syphilis, 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   Post-traumatic stress disorder, unspecified 11/28/2019   Sedative, 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   MDD (major depressive disorder), recurrent severe, without psychosis ([place]) 07/13/2017   Major depressive disorder, recurrent, severe with psychotic features ([place]) 07/13/2017   MDD (major depressive disorder), recurrent, severe, with psychosis ([place]) 07/31/2014   Benzodiazepine misuse 07/31/2014   Human immunodeficiency virus infection ([place])    Delusional disorder ([place]) 07/30/2014   Suicidal thoughts 07/30/2014   Visual hallucinations    Generalized anxiety disorder with panic attacks 03/26/2013    Past Surgical History:  Procedure Laterality Date   CATARACT EXTRACTION Left        Family History  Problem Relation Age of Onset   Mental illness Mother    Bipolar disorder Brother     Social History   Tobacco Use   Smoking status: Never   Smokeless tobacco: Never  Vaping Use   Vaping Use: Never used  Substance Use Topics   Alcohol use: No   Drug use: No    Home Medications Prior to Admission medications   Medication Sig Start Date End Date Taking? Authorizing Provider  bictegravir-emtricitabine-tenofovir AF (BIKTARVY) 50-200-25 MG TABS tablet Take 1 tablet by mouth daily. 04/14/21   Golden Circle, FNP  cholecalciferol (VITAMIN D3) 25 MCG (1000 UNIT) tablet Take 1,000 Units by mouth daily.    [provider]  escitalopram (LEXAPRO) 20 MG tablet Take 1 tablet (20 mg total) by mouth daily. Patient to take 1 tablet (20 mg total) for 6 days followed by 2 tablets (40 mg total) daily Patient taking differently: Take 40 mg by mouth daily. 06/09/21 06/09/22  Nwoko, Terese Door, PA  gabapentin (NEURONTIN) 100 MG capsule  Take 1 capsule (100 mg total) by mouth 3 (three) times daily. 06/08/21   Truddie Hidden, MD  LORazepam (ATIVAN) 1 MG tablet Take 1 tablet (1 mg total) by mouth 3 (three) times daily as needed for anxiety. Patient not taking: No sig reported 06/08/21   Truddie Hidden, MD  OLANZapine (ZYPREXA) 10 MG tablet Take 1 tablet (10 mg total) by mouth at bedtime. 06/09/21 06/09/22  Nwoko, Terese Door, PA  Omega-3 Fatty Acids (FISH OIL) 1000 MG CPDR Take 1,000 mg by mouth daily.    [provider]  tamsulosin (FLOMAX) 0.4 MG CAPS capsule Take 0.4 mg by mouth daily.    [provider]    Allergies    Patient has no known allergies.  Review of Systems   Review of Systems  Constitutional:  Negative for chills and fever.  HENT:  Positive for hearing loss. Negative for ear pain and sore throat.   Eyes:  Negative for pain and visual disturbance.  Respiratory:  Negative for cough and shortness of breath.   Cardiovascular:  Negative for chest pain and palpitations.  Gastrointestinal:  Negative for abdominal pain and vomiting.  Genitourinary:  Negative for dysuria and hematuria.  Musculoskeletal:  Negative for arthralgias and back pain.  Skin:  Negative for color change and rash.  Neurological:  Positive for tremors. Negative for seizures and syncope.  Psychiatric/Behavioral:  The patient is nervous/anxious.   All other systems reviewed and are negative.  Physical Exam Updated Vital Signs BP (!) 152/93 (BP Location: Right Arm)   Pulse (!) 124   Temp 98.7 F (37.1 C) (Oral)   Resp (!) 22   SpO2 96%   Physical Exam Vitals and nursing note reviewed.  Constitutional:      Appearance: Normal appearance. He is well-developed.  HENT:     Head: Normocephalic and atraumatic.  Eyes:     Extraocular Movements: Extraocular movements intact.     Conjunctiva/sclera: Conjunctivae normal.     Pupils: Pupils are equal, round, and reactive to light.  Cardiovascular:     Rate and  Rhythm: Regular rhythm. Tachycardia present.     Heart sounds: No murmur heard. Pulmonary:     Effort: Pulmonary effort is normal. No respiratory distress.     Breath sounds: Normal breath sounds.  Abdominal:     Palpations: Abdomen is soft.     Tenderness: There is no abdominal tenderness.  Musculoskeletal:     Cervical back: Normal range of motion and neck supple.  Skin:    General: Skin is warm and dry.  Neurological:     General: No focal deficit present.     Mental Status: He is alert and oriented to person, place, and time. Mental status is at baseline.     Cranial Nerves: Cranial nerve deficit present.     Sensory: No sensory deficit.     Motor: No weakness.     Coordination: Coordination normal.     Comments: Patient with hearing loss.  Patient's coordination seems to be intact.  Did  not walk him to see if there is abnormal gait.  But he does have tremors to both hands.    ED Results / Procedures / Treatments   Labs (all labs ordered are listed, but only abnormal results are displayed) Labs Reviewed - No data to display  EKG None  Radiology No results found.  Procedures Procedures   Medications Ordered in ED Medications - No data to display  ED Course  I have reviewed the triage vital signs and the nursing notes.  Pertinent labs & imaging results that were available during my care of the patient were reviewed by me and considered in my medical decision making (see chart for details).    MDM Rules/Calculators/A&P                           Patient is requesting to talk to behavioral health.  Denies suicidal ideation today.  But he said he had some yesterday.  Patient seems to have memory problems about his medication.  He seems to imply is not able to get any refills until December 6.  But it appears that this was addressed on October 31 just 2 days ago when he was seen here in the emergency department and evaluated by behavioral health.  Patient is tachycardic  this may be due to the anxiety and tremors.  We will treat with Ativan and do screening labs for behavioral health evaluation.  Patient evaluated by behavioral health.  They are going to reassess him in the morning.  I will continue the Ativan as needed while he is here.  Labs are significant for white blood cell count of 17,000 liver function test without significant abnormalities.  Tylenol in the system.  Urine drug screen pending.  No specific explanation for the leukocytosis but patient is not febrile.  Does not seem to have an infectious state.  Patient medically cleared.   Final Clinical Impression(s) / ED Diagnoses Final diagnoses:  Anxiety  Tremor    Rx / DC Orders ED Discharge Orders     None        Vanetta Mulders, MD 07/01/21 1756

## 2021-07-01 NOTE — ED Notes (Signed)
TTS done 

## 2021-07-01 NOTE — ED Triage Notes (Signed)
Pt presents with c/o anxiety. Pt is very anxious in triage, seen multiple times for same. Pt also has tremors right now in triage. Pt does need ASL for interpretation. Pt denies any pain.

## 2021-07-01 NOTE — ED Notes (Signed)
Meal tray offered to pt.  

## 2021-07-02 ENCOUNTER — Other Ambulatory Visit: Payer: Self-pay

## 2021-07-02 LAB — RAPID URINE DRUG SCREEN, HOSP PERFORMED
Amphetamines: NOT DETECTED
Barbiturates: NOT DETECTED
Benzodiazepines: POSITIVE — AB
Cocaine: NOT DETECTED
Opiates: NOT DETECTED
Tetrahydrocannabinol: NOT DETECTED

## 2021-07-02 MED ORDER — LORAZEPAM 1 MG PO TABS
1.0000 mg | ORAL_TABLET | Freq: Once | ORAL | Status: AC
  Administered 2021-07-02: 1 mg via ORAL

## 2021-07-02 NOTE — ED Notes (Signed)
Repeat VS are delayed due to patient being asleep.

## 2021-07-02 NOTE — BH Assessment (Addendum)
[facility] Assessment Progress Note   Per Dorena Bodo, NP, this voluntary pt does not require psychiatric hospitalization at this time.  Pt is psychiatrically cleared.  Discharge instructions include pt's next scheduled appointment with Karel Jarvis, PA at [facility] (Tuesday, 08/04/2021 at 15:00), as well as information about substance use disorder counseling at [facility] of the [place].  Doylene Canning, MA Triage Specialist [phone]   Addendum:  [name] Susy Frizzle, MD and pt's nurse, Norberta Keens, have been notified.  Doylene Canning, [place] Behavioral Health Coordinator [phone]

## 2021-07-02 NOTE — ED Notes (Signed)
Assumed care of pt at 0700.  Pt resting comfortably at this time.

## 2021-07-02 NOTE — ED Notes (Signed)
Patient asked to use the restroom. Patient was escorted to the restroom to empty his foley catheter. Patient began shaking and tremoring, grabbing at the things and staff. Patient states he is anxious, seeing spirits and feeling things. Message was sent to Dr for medication.

## 2021-07-02 NOTE — Consult Note (Addendum)
Telepsych Consultation   Reason for Consult:  psychiatric evaluation Referring Physician:  Dr. Effie Shy, [name] Location of Patient:  [facility] Location of Provider: [facility]  Patient Identification: Tyler Horn MRN:  [id number] Principal Diagnosis: Generalized anxiety disorder with panic attacks Diagnosis:  Principal Problem:   Generalized anxiety disorder with panic attacks   Total Time spent with patient: 45 minutes  Subjective:   Tyler Horn is a 60 y.o. male patient admitted with per admit note:   Patient returns today for very similar complaints to what he had on October 31.  Patient seen multiple times for similar concerns about his medication behavioral health issues and anxiety.  Patient actually seen October 27 October 8 September 6 September 4 and August 7.  Patient was evaluated by behavioral health on October 31.  It appeared that they had addressed the medication concerns.  Patient does not think he has any medications or cannot get them till December 6.  Currently not suicidal.  But he says at times he is.  He wants to talk to behavioral health again.  Past medical history is significant for chronic hepatitis C HIV infection.  Patient states that his gait has been offkilter and he has fallen but he did not injure anything.  He has tremors and is complaining of significant anxiety.  Patient is on antivirals.  Not clear whether he is taking them or not but he says he is.  But seems to have a lot of confusion about his other medications.  Psychiatric history is significant for major depressive disorder insomnia panic disorder.  Patient has prescription at least in the past for Zyprexa and Neurontin Lexapro.  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  11/2 note:  Today, patient returns to the emergency department with c/o of anxiety. Upon arrival to the ED, he received Biktarvy, Lexapro, Neurontin, and Ativan.    Tyler Horn is 60 year old  male seen by this provider via tele psych with ASL interpreter at bedside.  Patient is sitting on side of bed extremely restless, frequently grabbing his head with both hands and saying that he is not safe and that he needs something stronger for his anxiety.  When asked about whether he went for medication management as a walk-in today, he kept repeating that "they told me I cannot come back until 12/6"  This provider does not see record that he went to the [facility] today. Patient told ASL interpreter that he only comes to [facility] for his care and does not remember seeing his provider on 10/11 at [facility].  Patient was held overnight for stabilization for am psych evaluation.     Marland Kitchen  HPI:  Tyler Horn is 60 year old male seen by this provider via tele psych at [facility]. ASL interpreter assisting with this call.   At the beginning of this call, patient is sitting calmly in the chair, gently swaying back and forth, much calmer than on 11/2 assessment. He reports he is not ready to go home yet, he needs one more night.  When asked patient about his medication refill at [facility], he would not answer the question, it took several attempts before he would say "he is too stressed to think right now". Patient continued to avoid answering the questions concerning his medication refills and stating that if he goes home he will not have any medicines. This patient was instructed on 10/31 to present for walk-in hours at [facility], but has failed to  do so. He reported last evening that "I only come to [facility] for my care".    Denies suicidal ideation, reports that he feels stress and confused about going to pick up his medications. Finally gave this Clinical research associate permission to call his mother [name] for collateral. Unable to contact [name], there was no answer.   This provider attempted to explain that patients medications are at [facility] with one refill, eventually he as knowledged that he was just too stressed to think about going to get his  medications.   This patient has had several visits to the emergency room with similar complaints. 11/2, 10/31, 10/27, 10/8, 9/6, 9/4-- with inpatient admission 9/6-9/16 for similar complaints.   Collateral:   Attempted to call this patient's case manager: Wyatt Portela #[phone] (mailbox [phone]), to see if she could provide support. Attempted to call mother (with permission) [name] #[phone] with no answer.   It is noted from past assessment that this patient lives in an apartment alone with family and friends as support, but this Clinical research associate is unable to locate support today. This patient would benefit from an ACT Team, however, he does not have Medicaid.   Past Psychiatric History:   Risk to Self:  no Risk to Others:  no Prior Inpatient Therapy:  yes Prior Outpatient Therapy:  yes  Past Medical History:  Past Medical History:  Diagnosis Date   Anxiety    Chronic hepatitis C (HCC) 04/27/2020   Depression    HIV (human immunodeficiency virus infection) (HCC)     Past Surgical History:  Procedure Laterality Date   CATARACT EXTRACTION Left    Family History:  Family History  Problem Relation Age of Onset   Mental illness Mother    Bipolar disorder Brother    Family Psychiatric  History:  Social History:  Social History   Substance and Sexual Activity  Alcohol Use No     Social History   Substance and Sexual Activity  Drug Use No    Social History   Socioeconomic History   Marital status: Single    Spouse name: Not on file   Number of children: Not on file   Years of education: Not on file   Highest education level: Not on file  Occupational History   Not on file  Tobacco Use   Smoking status: Never   Smokeless tobacco: Never  Vaping Use   Vaping Use: Never used  Substance and Sexual Activity   Alcohol use: No   Drug use: No   Sexual activity: Yes    Comment: pt given condoms  Other Topics Concern   Not on file  Social History Narrative   Not on file    Social Determinants of Health   Financial Resource Strain: Not on file  Food Insecurity: Not on file  Transportation Needs: Not on file  Physical Activity: Not on file  Stress: Not on file  Social Connections: Not on file   Additional Social History:    Allergies:  No Known Allergies  Labs:  Results for orders placed or performed during the hospital encounter of 07/01/21 (from the past 48 hour(s))  Ethanol     Status: None   Collection Time: 07/01/21  3:35 PM  Result Value Ref Range   Alcohol, Ethyl (B) <10 <10 mg/dL    Comment: (NOTE) Lowest detectable limit for serum alcohol is 10 mg/dL.  For medical purposes only. Performed at [facility], [address], [place], [place] [address]  Comprehensive metabolic panel     Status: Abnormal   Collection Time: 07/01/21  3:35 PM  Result Value Ref Range   Sodium 135 135 - 145 mmol/L   Potassium 4.3 3.5 - 5.1 mmol/L   Chloride 104 98 - 111 mmol/L   CO2 19 (L) 22 - 32 mmol/L   Glucose, Bld 111 (H) 70 - 99 mg/dL    Comment: Glucose reference range applies only to samples taken after fasting for at least 8 hours.   BUN 8 6 - 20 mg/dL   Creatinine, Ser 1.63 0.61 - 1.24 mg/dL   Calcium 9.4 8.9 - 84.6 mg/dL   Total Protein 8.8 (H) 6.5 - 8.1 g/dL   Albumin 4.8 3.5 - 5.0 g/dL   AST 21 15 - 41 U/L   ALT 11 0 - 44 U/L   Alkaline Phosphatase 127 (H) 38 - 126 U/L   Total Bilirubin 0.7 0.3 - 1.2 mg/dL   GFR, Estimated >65 >99 mL/min    Comment: (NOTE) Calculated using the CKD-EPI Creatinine Equation (2021)    Anion gap 12 5 - 15    Comment: Performed at [facility], [address], [place], [place] [address]  CBC with Differential/Platelet     Status: Abnormal   Collection Time: 07/01/21  3:35 PM  Result Value Ref Range   WBC 17.9 (H) 4.0 - 10.5 K/uL   RBC 5.69 4.22 - 5.81 MIL/uL   Hemoglobin 18.0 (H) 13.0 - 17.0 g/dL   HCT 77.9 (H) 39.0 - 30.0 %   MCV 94.2 80.0 - 100.0 fL   MCH 31.6  26.0 - 34.0 pg   MCHC 33.6 30.0 - 36.0 g/dL   RDW 92.3 30.0 - 76.2 %   Platelets 358 150 - 400 K/uL   nRBC 0.0 0.0 - 0.2 %   Neutrophils Relative % 87 %   Neutro Abs 15.6 (H) 1.7 - 7.7 K/uL   Lymphocytes Relative 7 %   Lymphs Abs 1.3 0.7 - 4.0 K/uL   Monocytes Relative 5 %   Monocytes Absolute 0.9 0.1 - 1.0 K/uL   Eosinophils Relative 0 %   Eosinophils Absolute 0.0 0.0 - 0.5 K/uL   Basophils Relative 0 %   Basophils Absolute 0.1 0.0 - 0.1 K/uL   Immature Granulocytes 1 %   Abs Immature Granulocytes 0.10 (H) 0.00 - 0.07 K/uL    Comment: Performed at [facility], [address] [place], [place] [address]  Acetaminophen level     Status: Abnormal   Collection Time: 07/01/21  3:35 PM  Result Value Ref Range   Acetaminophen (Tylenol), Serum <10 (L) 10 - 30 ug/mL    Comment: (NOTE) Therapeutic concentrations vary significantly. A range of 10-30 ug/mL  may be an effective concentration for many patients. However, some  are best treated at concentrations outside of this range. Acetaminophen concentrations >150 ug/mL at 4 hours after ingestion  and >50 ug/mL at 12 hours after ingestion are often associated with  toxic reactions.  Performed at [facility], [address] [place], [place] [address]   Rapid urine drug screen (hospital performed)     Status: Abnormal   Collection Time: 07/02/21  3:00 AM  Result Value Ref Range   Opiates NONE DETECTED NONE DETECTED   Cocaine NONE DETECTED NONE DETECTED   Benzodiazepines POSITIVE (A) NONE DETECTED   Amphetamines NONE DETECTED NONE DETECTED   Tetrahydrocannabinol NONE DETECTED NONE DETECTED   Barbiturates NONE DETECTED NONE  DETECTED    Comment: (NOTE) DRUG SCREEN FOR MEDICAL PURPOSES ONLY.  IF CONFIRMATION IS NEEDED FOR ANY PURPOSE, NOTIFY LAB WITHIN 5 DAYS.  LOWEST DETECTABLE LIMITS FOR URINE DRUG SCREEN Drug Class                     Cutoff (ng/mL) Amphetamine and metabolites     1000 Barbiturate and metabolites    200 Benzodiazepine                 200 Tricyclics and metabolites     300 Opiates and metabolites        300 Cocaine and metabolites        300 THC                            50 Performed at [facility], [address], [place], [place] [address]     Medications:  Current Facility-Administered Medications  Medication Dose Route Frequency Provider Last Rate Last Admin   bictegravir-emtricitabine-tenofovir AF (BIKTARVY) 50-200-25 MG per tablet 1 tablet  1 tablet Oral Daily Vanetta Mulders, MD   1 tablet at 07/02/21 0947   escitalopram (LEXAPRO) tablet 40 mg  40 mg Oral Daily Vanetta Mulders, MD   40 mg at 07/02/21 0946   gabapentin (NEURONTIN) capsule 100 mg  100 mg Oral TID Vanetta Mulders, MD   100 mg at 07/02/21 0946   LORazepam (ATIVAN) tablet 1 mg  1 mg Oral Q6H PRN Vanetta Mulders, MD   1 mg at 07/02/21 1015   OLANZapine (ZYPREXA) tablet 10 mg  10 mg Oral QHS Vanetta Mulders, MD   10 mg at 07/01/21 2310   Current Outpatient Medications  Medication Sig Dispense Refill   bictegravir-emtricitabine-tenofovir AF (BIKTARVY) 50-200-25 MG TABS tablet Take 1 tablet by mouth daily. 30 tablet 5   cholecalciferol (VITAMIN D3) 25 MCG (1000 UNIT) tablet Take 1,000 Units by mouth daily.     escitalopram (LEXAPRO) 20 MG tablet Take 1 tablet (20 mg total) by mouth daily. Patient to take 1 tablet (20 mg total) for 6 days followed by 2 tablets (40 mg total) daily (Patient taking differently: Take 40 mg by mouth daily.) 30 tablet 1   Omega-3 Fatty Acids (FISH OIL) 1000 MG CPDR Take 1,000 mg by mouth daily.     tamsulosin (FLOMAX) 0.4 MG CAPS capsule Take 0.4 mg by mouth daily.     gabapentin (NEURONTIN) 100 MG capsule Take 1 capsule (100 mg total) by mouth 3 (three) times daily. 3 capsule 0   LORazepam (ATIVAN) 1 MG tablet Take 1 tablet (1 mg total) by mouth 3 (three) times daily as needed for anxiety. (Patient not taking: No sig reported) 3  tablet 0   OLANZapine (ZYPREXA) 10 MG tablet Take 1 tablet (10 mg total) by mouth at bedtime. 30 tablet 1   QUEtiapine (SEROQUEL) 200 MG tablet Take 200 mg by mouth at bedtime.      Musculoskeletal: Strength & Muscle Tone: within normal limits Gait & Station: normal Patient leans: N/A     Psychiatric Specialty Exam:  Presentation  General Appearance: Appropriate for Environment  Eye Contact:Good  Speech:Clear and Coherent  Speech Volume:Other (comment) (deaf)  Handedness:Right   Mood and Affect  Mood:Anxious  Affect:Congruent   Thought Process  Thought Processes:Coherent  Descriptions of Associations:Intact  Orientation:Full (Time, Place and Person)  Thought Content:Logical  History of Schizophrenia/Schizoaffective disorder:No  Duration of Psychotic Symptoms:N/A  Hallucinations:Hallucinations: Auditory; Visual Description of Auditory Hallucinations: telling him to hurt himself Description of Visual Hallucinations: seeing ghost  Ideas of Reference:None  Suicidal Thoughts:Suicidal Thoughts: Yes, Passive SI Passive Intent and/or Plan: Without Intent; Without Plan  Homicidal Thoughts:Homicidal Thoughts: No   Sensorium  Memory:Immediate Poor; Recent Poor; Remote Fair  Judgment:Impaired  Insight:Fair   Executive Functions  Concentration:Fair  Attention Span:Fair  Recall:Fair  Fund of Knowledge:Fair  Language:Fair   Psychomotor Activity  Psychomotor Activity:Psychomotor Activity: Increased   Assets  Assets:Desire for Improvement   Sleep  Sleep:Sleep: Fair    Physical Exam: Physical Exam Vitals reviewed.  Constitutional:      General: He is not in acute distress. Cardiovascular:     Rate and Rhythm: Normal rate.  Pulmonary:     Effort: Pulmonary effort is normal.  Neurological:     Mental Status: He is alert and oriented to person, place, and time.  Psychiatric:        Attention and Perception: Attention normal. He does  not perceive auditory or visual hallucinations.        Mood and Affect: Mood is anxious.        Speech: Speech is tangential.        Behavior: Behavior is cooperative.        Thought Content: Thought content is not paranoid or delusional. Thought content does not include homicidal or suicidal ideation. Thought content does not include homicidal or suicidal plan.     Comments: At beginning of psychiatric assessment, patient is sitting calmly in room. He appears much calmer than the last assessment by this provider on 11/2. ASL interpreter assisting with this interview. By the end of the interview, this patient was exhibiting signs of stress by grabbing his face and not answering the questions that were being asked of him.    Review of Systems  Unable to perform ROS: Other  Blood pressure 126/77, pulse 89, temperature 97.6 F (36.4 C), temperature source Oral, resp. rate 14, height  (1.93 m), weight 87 kg, SpO2 95 %. Body mass index is 23.35 kg/m.  Treatment Plan Summary: Plan This patient does not meet criteria for inpatient psychiatric treatment. Recommend follow-up with outpatient provider for increasing anxiety. This patient does not have an outpatient provider who prescribes benzodiazepines, it is recommended that this patient find an outpatient psychiatrist to discuss benzodiazepine therapy, as the patient is utilizing the emergency department to manage his symptoms. Attempted to explain to patient that he has refills on his Lexapro and Zyprexa that were prescribed on 10/11 at [facility] in [place].   Recommend that the emergency department discontinue giving this patient benzodiazepines in the future.   Psychiatrically cleared to follow-up with [facility] provider.   Disposition: No evidence of imminent risk to self or others at present.   Patient does not meet criteria for psychiatric inpatient admission. Supportive therapy provided about ongoing stressors. Discussed crisis plan,  support from social network, calling 911, coming to the Emergency Department, and calling Suicide Hotline.  This service was provided via telemedicine using a 2-way, interactive audio and video technology.  Names of all persons participating in this telemedicine service and their role in this encounter. Name: Loren Sawaya Role: patient  Name: Dorena Bodo Role: NP  Name: Nelly Rout Role: MD/psychiatrist  Name: ASL interpreter (BJ)  Role: interpreter     Novella Olive, NP 07/02/2021 12:12 PM

## 2021-07-02 NOTE — ED Notes (Signed)
Pt ambulatory to restroom w/staff assist x 2 with difficulty. Pt very shaky and unsteady on feet.

## 2021-07-02 NOTE — Discharge Instructions (Addendum)
For your behavioral health needs you are advised to continue treatment at [facility].  Your next appointment with Karel Jarvis, PA is scheduled for Tuesday, August 04, 2021 at 3:00 pm:       [facility]      [address]      [place], [place] [address]      [phone]  [facility] offers counseling for people suffering from substance use disorders.  Contact them at your earliest opportunity to ask about enrolling in their program:       [facility]      [address]      [place], [place] [address]      [phone]       New patients are seen at their walk-in clinic.  Walk-in hours are Monday - Friday from 8:30 am - 12:00 pm, and from 1:00 pm - 2:30 pm.  Walk-in patients are seen on a first come, first served basis, so try to arrive as early as possible for the best chance of being seen the same day.

## 2021-07-02 NOTE — ED Provider Notes (Signed)
Emergency Medicine Observation Re-evaluation Note  Tyler Horn is a 60 y.o. male, seen on rounds today.  Pt initially presented to the ED for complaints of Tremors and Anxiety Currently, the patient is resting comfortably.  Physical Exam  BP 126/77 (BP Location: Right Arm)   Pulse 89   Temp 97.6 F (36.4 C) (Oral)   Resp 14   Ht 6\' 4"  (1.93 m)   Wt 87 kg   SpO2 95%   BMI 23.35 kg/m  Physical Exam General: Nontoxic appearance Cardiac: Normal heart rate Lungs: Normal respiratory rate Psych: No internal responsiveness  ED Course / MDM  EKG:EKG Interpretation  Date/Time:  Wednesday July 01 2021 16:11:19 EDT Ventricular Rate:  107 PR Interval:  160 QRS Duration: 96 QT Interval:  338 QTC Calculation: 451 R Axis:   66 Text Interpretation: Sinus tachycardia with occasional Premature ventricular complexes Possible Left atrial enlargement Borderline ECG Confirmed by [birth year]1975 [phone]) on 07/01/2021 4:16:55 PM  I have reviewed the labs performed to date as well as medications administered while in observation.  Recent changes in the last 24 hours include he has been seen and cleared by psychiatry.  Social workers evaluated him and helped him with follow-up planning..  Plan  Current plan is for discharge home with outpatient follow-up at the [facility] behavioral health.[facility] is not under involuntary commitment.     [name], MD 07/02/21 [phone]

## 2021-07-03 ENCOUNTER — Telehealth: Payer: Self-pay

## 2021-07-03 NOTE — Telephone Encounter (Signed)
Patient current client with [facility]. Updated on recent ED visit and follow visits scheduled with outpatient behavioral health. Provider also made aware by ED. Valarie Cones

## 2021-07-05 ENCOUNTER — Other Ambulatory Visit: Payer: Self-pay

## 2021-07-05 ENCOUNTER — Encounter (HOSPITAL_COMMUNITY): Payer: Self-pay | Admitting: Emergency Medicine

## 2021-07-05 ENCOUNTER — Emergency Department (HOSPITAL_COMMUNITY)
Admission: EM | Admit: 2021-07-05 | Discharge: 2021-07-07 | Disposition: A | Discharge status: Other Acute Inpatient Hospital | Payer: Self-pay | Attending: Student | Admitting: Student

## 2021-07-05 DIAGNOSIS — R45851 Suicidal ideations: Secondary | ICD-10-CM | POA: Insufficient documentation

## 2021-07-05 DIAGNOSIS — Y900 Blood alcohol level of less than 20 mg/100 ml: Secondary | ICD-10-CM | POA: Insufficient documentation

## 2021-07-05 DIAGNOSIS — F333 Major depressive disorder, recurrent, severe with psychotic symptoms: Secondary | ICD-10-CM | POA: Insufficient documentation

## 2021-07-05 DIAGNOSIS — Z79899 Other long term (current) drug therapy: Secondary | ICD-10-CM | POA: Insufficient documentation

## 2021-07-05 DIAGNOSIS — Z20822 Contact with and (suspected) exposure to covid-19: Secondary | ICD-10-CM | POA: Insufficient documentation

## 2021-07-05 DIAGNOSIS — Z21 Asymptomatic human immunodeficiency virus [HIV] infection status: Secondary | ICD-10-CM | POA: Insufficient documentation

## 2021-07-05 LAB — SALICYLATE LEVEL: Salicylate Lvl: <7 mg/dL — ABNORMAL LOW (ref 7.0–30.0)

## 2021-07-05 LAB — CBC WITH DIFFERENTIAL/PLATELET
Abs Immature Granulocytes: 0.07 10*3/uL (ref 0.00–0.07)
Basophils Absolute: 0.1 10*3/uL (ref 0.0–0.1)
Basophils Relative: 0 %
Eosinophils Absolute: 0.1 10*3/uL (ref 0.0–0.5)
Eosinophils Relative: 1 %
HCT: 53.1 % — ABNORMAL HIGH (ref 39.0–52.0)
Hemoglobin: 18 g/dL — ABNORMAL HIGH (ref 13.0–17.0)
Immature Granulocytes: 1 %
Lymphocytes Relative: 15 %
Lymphs Abs: 1.9 10*3/uL (ref 0.7–4.0)
MCH: 31.5 pg (ref 26.0–34.0)
MCHC: 33.9 g/dL (ref 30.0–36.0)
MCV: 92.8 fL (ref 80.0–100.0)
Monocytes Absolute: 0.7 10*3/uL (ref 0.1–1.0)
Monocytes Relative: 6 %
Neutro Abs: 9.5 10*3/uL — ABNORMAL HIGH (ref 1.7–7.7)
Neutrophils Relative %: 77 %
Platelets: 380 10*3/uL (ref 150–400)
RBC: 5.72 MIL/uL (ref 4.22–5.81)
RDW: 13.4 % (ref 11.5–15.5)
WBC: 12.2 10*3/uL — ABNORMAL HIGH (ref 4.0–10.5)
nRBC: 0 % (ref 0.0–0.2)

## 2021-07-05 LAB — RAPID URINE DRUG SCREEN, HOSP PERFORMED
Amphetamines: NOT DETECTED
Barbiturates: NOT DETECTED
Benzodiazepines: POSITIVE — AB
Cocaine: NOT DETECTED
Opiates: NOT DETECTED
Tetrahydrocannabinol: NOT DETECTED

## 2021-07-05 LAB — COMPREHENSIVE METABOLIC PANEL
ALT: 19 U/L (ref 0–44)
AST: 23 U/L (ref 15–41)
Albumin: 4.7 g/dL (ref 3.5–5.0)
Alkaline Phosphatase: 125 U/L (ref 38–126)
Anion gap: 12 (ref 5–15)
BUN: 22 mg/dL — ABNORMAL HIGH (ref 6–20)
CO2: 18 mmol/L — ABNORMAL LOW (ref 22–32)
Calcium: 9.6 mg/dL (ref 8.9–10.3)
Chloride: 104 mmol/L (ref 98–111)
Creatinine, Ser: 1.3 mg/dL — ABNORMAL HIGH (ref 0.61–1.24)
GFR, Estimated: >60 mL/min (ref >60)
Glucose, Bld: 152 mg/dL — ABNORMAL HIGH (ref 70–99)
Potassium: 3.6 mmol/L (ref 3.5–5.1)
Sodium: 134 mmol/L — ABNORMAL LOW (ref 135–145)
Total Bilirubin: 1.2 mg/dL (ref 0.3–1.2)
Total Protein: 8.4 g/dL — ABNORMAL HIGH (ref 6.5–8.1)

## 2021-07-05 LAB — RESP PANEL BY RT-PCR (FLU A&B, COVID) ARPGX2
Influenza A by PCR: NEGATIVE
Influenza B by PCR: NEGATIVE
SARS Coronavirus 2 by RT PCR: NEGATIVE

## 2021-07-05 LAB — ACETAMINOPHEN LEVEL: Acetaminophen (Tylenol), Serum: <10 ug/mL — ABNORMAL LOW (ref 10–30)

## 2021-07-05 LAB — ETHANOL: Alcohol, Ethyl (B): <10 mg/dL (ref <10)

## 2021-07-05 MED ORDER — LORAZEPAM 1 MG PO TABS
1.0000 mg | ORAL_TABLET | Freq: Once | ORAL | Status: AC
  Administered 2021-07-05: 1 mg via ORAL
  Filled 2021-07-05: qty 1

## 2021-07-05 MED ORDER — LORAZEPAM 2 MG/ML IJ SOLN: 1.0000 mg | Freq: Once | INTRAMUSCULAR | Status: DC

## 2021-07-05 MED ORDER — ACETYLCYSTEINE 200 MG/ML IV SOLN
13050.0000 mg/h | INTRAVENOUS | Status: DC
  Filled 2021-07-05: qty 90

## 2021-07-05 MED ORDER — ACETYLCYSTEINE LOAD VIA INFUSION: 150.0000 mg/kg | Freq: Once | INTRAVENOUS | Status: DC

## 2021-07-05 NOTE — ED Triage Notes (Signed)
PT ambulatory to ER states he took 2 bottles of Nyquil and 100 sleeping pills.  Pt reports he took these 2 nights ago (Friday night).  Pt was recently seen in ED.

## 2021-07-05 NOTE — Progress Notes (Signed)
Inpatient Behavioral Health Placement  Pt meets inpatient criteria per B. Leevy-Johnson, NP.Per [facility] [employer], RN there are no appropriate beds at [facility]. Referral was sent to the following facilities.  Destination Service Provider Address Phone Fax  [facility]  [address] [place] [address] [phone]  [facility] [address]  [address], [place] [address] [phone]  [facility]  [address] [place], [place] [address] [phone]  [facility]  [address] [place] [address] [phone]  [facility]  [address] [place], [place] [address] [phone]  [facility]  [address] [place] [address] [phone]  [facility]  [address] [place] [address] [phone]  [facility]  [address] [place] [address] [phone]  [facility]  [address], [place] [address] [phone]  [facility]  [address] [place] [address] [phone]  [facility]  [address] [place] [address] [phone]  [facility]  [address] [place] [address] [phone]  [facility]  [address], [place] [address] [phone]  [facility]  [address] [place] [address] [phone]  [facility]  [address] [place] [address] [phone]  [facility]  [address], [place] [address] [phone]  [facility]  [address], [place] [address] [phone]  [facility]  [address] [place] [address] [phone]     Situation ongoing,  CSW will follow up.   Maryjean Ka, MSW, [facility] 07/05/2021  @ 6:23 PM

## 2021-07-05 NOTE — ED Provider Notes (Signed)
[facility] DEPT Provider Note   CSN: [id number] Arrival date & time: 07/05/21  [id number]     History Chief Complaint  Patient presents with   Suicidal    Pt stated that he attempted suicide by ingesting two bottles of Nyquil and about 100 sleeping pills    Tyler Horn is a 60 y.o. male who presents for evaluation approximately 36 hours after suicide attempt by overdose.  Drank 2 bottles of NyQuil and took approximately 100 tablets of Unisom and attempt to kill himself due to his anxiety.  Endorsed difficulty ambulating due to disequilibrium, nausea, generalized weakness after overdose.  States this is now resolved.  States that he wants help as it is scary to feel suicidal in his brain without definitive help.  He supposed to have outpatient management    but states next appointment is not until 12/6.  Patient has been seen multiple times in the emergency department for the same presentation.  Was recently seen on 07/01/09/31, 10/27, 10/8 and was previously admitted 9/6 for suicidal ideation.  Patient states repeatedly that he "needs Ativan"; he is unable to articulate if he is taking medications due to severe agitation at time of my exam.  I personally reviewed this patient's medical records.  He has history of HIV, latent syphilis, and chronic hep C.  Unclear if he is on his medications due to agitation.  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/2020   Vision changes 07/09/2020   Weight loss 07/09/2020   Idiopathic peripheral neuropathy 04/27/2020   Herpes  simplex 04/27/2020   Herpes zoster 04/27/2020   Healthcare maintenance 12/19/2019   Early syphilis, 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   Post-traumatic stress disorder, unspecified 11/28/2019   Sedative, 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   MDD (major depressive disorder), recurrent severe, without psychosis ([place]) 07/13/2017   Major depressive disorder, recurrent, severe with psychotic features ([place]) 07/13/2017   MDD (major depressive disorder), recurrent, severe, with psychosis ([place]) 07/31/2014   Benzodiazepine misuse 07/31/2014   Human immunodeficiency virus infection ([place])    Delusional disorder ([place]) 07/30/2014   Suicidal thoughts 07/30/2014   Visual hallucinations    Generalized anxiety disorder with panic attacks 03/26/2013    Past Surgical History:  Procedure Laterality Date   CATARACT EXTRACTION Left        Family History  Problem Relation Age of Onset   Mental illness Mother    Bipolar disorder Brother     Social History   Tobacco Use   Smoking status: Never   Smokeless tobacco: Never  Vaping Use   Vaping Use: Never used  Substance Use Topics   Alcohol use: No   Drug use: No    Home Medications Prior to Admission medications   Medication Sig Start Date End Date Taking? Authorizing Provider  bictegravir-emtricitabine-tenofovir AF (BIKTARVY) 50-200-25 MG TABS tablet Take 1 tablet by mouth daily. 04/14/21  Yes Golden Circle, FNP  Cholecalciferol (VITAMIN D3 PO) Take 4 capsules by mouth every morning.   Yes [provider]  escitalopram (LEXAPRO) 20 MG tablet Take 1 tablet (20 mg total) by mouth daily. Patient to take 1 tablet (20 mg total) for 6 days followed by 2 tablets (40 mg total) daily Patient taking differently: Take 40 mg by mouth every morning. 06/09/21 06/09/22 Yes Nwoko, Uchenna E, PA  naproxen sodium (ALEVE) 220 MG tablet Take 220 mg by mouth 2 (two) times daily as needed (headache).   Yes  [provider]  OLANZapine (ZYPREXA) 10 MG tablet Take 1 tablet (10 mg total) by mouth at bedtime. 06/09/21 06/09/22 Yes Nwoko, Tommas Olp, PA  Omega-3 Fatty Acids (FISH OIL PO) Take 1 capsule by mouth every morning.   Yes [provider]  OVER THE COUNTER MEDICATION Take 25-50 mg by mouth at bedtime as needed (sleep). Unisom - doxylamine or diphenhydramine   Yes [provider]  Pseudoeph-Doxylamine-DM-APAP (NYQUIL PO) Take 2 tablets by mouth at bedtime.   Yes [provider]  gabapentin (NEURONTIN) 100 MG capsule Take 1 capsule (100 mg total) by mouth 3 (three) times daily. Patient not taking: Reported on 07/05/2021 06/08/21   Pollyann Savoy, MD  LORazepam (ATIVAN) 1 MG tablet Take 1 tablet (1 mg total) by mouth 3 (three) times daily as needed for anxiety. Patient not taking: No sig reported 06/08/21   Pollyann Savoy, MD  tamsulosin (FLOMAX) 0.4 MG CAPS capsule Take 0.4 mg by mouth daily. Patient not taking: No sig reported    [provider]    Allergies    Patient has no known allergies.  Review of Systems   Review of Systems  Constitutional: Negative.   HENT: Negative.    Eyes: Negative.   Respiratory: Negative.    Cardiovascular: Negative.   Gastrointestinal: Negative.   Genitourinary: Negative.   Musculoskeletal: Negative.   Skin: Negative.   Neurological:  Positive for weakness. Negative for dizziness, tremors, seizures, syncope, facial asymmetry, light-headedness, numbness and headaches.  Psychiatric/Behavioral:  Positive for self-injury, sleep disturbance and suicidal ideas. Negative for hallucinations. The patient is nervous/anxious.    Physical Exam Updated Vital Signs BP (!) 148/119   Pulse 96   Temp 98 F (36.7 C) (Oral)   Resp (!) 34   Ht 6\' 4"  (1.93 m)   Wt 87 kg   SpO2 95%   BMI 23.35 kg/m   Physical Exam Vitals and nursing note reviewed.  Constitutional:      Appearance: He is normal weight. He is not  toxic-appearing.  HENT:     Head: Normocephalic and atraumatic.     Nose: Nose normal. No congestion.     Mouth/Throat:     Mouth: Mucous membranes are moist.     Pharynx: Oropharynx is clear. Uvula midline. No oropharyngeal exudate or posterior oropharyngeal erythema.     Tonsils: No tonsillar exudate.  Eyes:     General: Lids are normal. Vision grossly intact.        Right eye: No discharge.        Left eye: No discharge.     Extraocular Movements: Extraocular movements intact.     Conjunctiva/sclera: Conjunctivae normal.     Pupils: Pupils are equal, round, and reactive to light.  Neck:     Trachea: Trachea and phonation normal.  Cardiovascular:     Rate and Rhythm: Normal rate and regular rhythm.     Pulses: Normal pulses.     Heart sounds: Normal heart sounds. No murmur heard. Pulmonary:     Effort: Pulmonary effort is normal. No  tachypnea, bradypnea, accessory muscle usage, prolonged expiration or respiratory distress.     Breath sounds: Normal breath sounds. No wheezing or rales.  Chest:     Chest wall: No mass, lacerations, deformity, swelling, tenderness, crepitus or edema.  Abdominal:     General: Bowel sounds are normal. There is no distension.     Palpations: Abdomen is soft.     Tenderness: There is no abdominal tenderness. There is no right CVA tenderness, left CVA tenderness, guarding or rebound.  Musculoskeletal:        General: No deformity.     Cervical back: Normal range of motion and neck supple. No edema, rigidity, tenderness or crepitus. No pain with movement, spinous process tenderness or muscular tenderness.     Right lower leg: No edema.     Left lower leg: No edema.  Lymphadenopathy:     Cervical: No cervical adenopathy.  Skin:    General: Skin is warm and dry.     Capillary Refill: Capillary refill takes less than 2 seconds.     Findings: No rash.  Neurological:     General: No focal deficit present.     Mental Status: He is alert and oriented to  person, place, and time. Mental status is at baseline.     GCS: GCS eye subscore is 4. GCS verbal subscore is 5. GCS motor subscore is 6.     Gait: Gait is intact. Gait normal.  Psychiatric:        Attention and Perception: He does not perceive auditory or visual hallucinations.        Mood and Affect: Mood is anxious. Affect is labile.        Behavior: Behavior is agitated.        Thought Content: Thought content is not paranoid. Thought content includes suicidal ideation. Thought content does not include homicidal ideation. Thought content includes suicidal plan. Thought content does not include homicidal plan.     Comments: Does not appear to be responding to internal stimuli.     ED Results / Procedures / Treatments   Labs (all labs ordered are listed, but only abnormal results are displayed) Labs Reviewed  COMPREHENSIVE METABOLIC PANEL - Abnormal; Notable for the following components:      Result Value   Sodium 134 (*)    CO2 18 (*)    Glucose, Bld 152 (*)    BUN 22 (*)    Creatinine, Ser 1.30 (*)    Total Protein 8.4 (*)    All other components within normal limits  RAPID URINE DRUG SCREEN, HOSP PERFORMED - Abnormal; Notable for the following components:   Benzodiazepines POSITIVE (*)    All other components within normal limits  CBC WITH DIFFERENTIAL/PLATELET - Abnormal; Notable for the following components:   WBC 12.2 (*)    Hemoglobin 18.0 (*)    HCT 53.1 (*)    Neutro Abs 9.5 (*)    All other components within normal limits  SALICYLATE LEVEL - Abnormal; Notable for the following components:   Salicylate Lvl [id number] (*)    All other components within normal limits  ACETAMINOPHEN LEVEL - Abnormal; Notable for the following components:   Acetaminophen (Tylenol), Serum <10 (*)    All other components within normal limits  RESP PANEL BY RT-PCR (FLU A&B, COVID) ARPGX2  ETHANOL    EKG EKG Interpretation  Date/Time:  Sunday July 05 2021 10:48:26 EST Ventricular Rate:   96 PR Interval:  153 QRS Duration: 95 QT  Interval:  333 QTC Calculation: 421 R Axis:   53 Text Interpretation: Sinus rhythm Ventricular premature complex Nonspecific T abnormalities, lateral leads Confirmed by Lacretia Leigh ([id number]) on 07/05/2021 2:54:35 PM  Radiology No results found.  Procedures Procedures   Medications Ordered in ED Medications  LORazepam (ATIVAN) tablet 1 mg (1 mg Oral Given 07/05/21 1135)    ED Course  I have reviewed the triage vital signs and the nursing notes.  Pertinent labs & imaging results that were available during my care of the patient were reviewed by me and considered in my medical decision making (see chart for details).  Clinical Course as of 07/05/21 1736  Sun Jul 05, 2021  1009 Consulted poison control who does recommend and starting NAC loading dose only at this time pending CMP and acetaminophen levels.  They recommend repeat poison control call once labs have resulted to discuss whether or not maintenance NAC is warranted.  Case discussed with pharmacist, Sharyn Lull who agrees with plan this time.  Additionally poison control states that benzos are a safe method of improving patient's agitation.  [RS]  1101 Medically cleared at this time. Awaiting TTS eval.  [RS]    Clinical Course User Index [RS] Reinhardt Licausi, Gypsy Balsam, PA-C   MDM Rules/Calculators/A&P                         60 year old male presents after intentional overdose or suicidal ideation.  Hypertensive on intake, vital signs otherwise normal.  Cardiopulmonary abdominal exam is benign.  Patient is neurovascular intact in all 4 extremities and is able to communicate clearly with assistance of signing language interpreter, Faythe Dingwall.  Patient is extremely agitated, tearful, fidgety, repeatedly requesting medication to help calm him down.  CBC with thrombocytosis of 12,000, hemoglobin elevated to 18.  CMP with mild hyponatremia 134, BUN/creatinine mildly elevated 22/1.3 near patient's  baseline.  Salicylate, ethanol, and acetaminophen levels are undetectable, and respiratory pathogen panel is negative.  EKG as above with sinus tachycardia without QTC prolongation.  Patient has been medically cleared by both this provider and poison control.  No indication for NAC at this time.  Patient awaiting TTS evaluation for psychiatric disposition.  Per psychiatric provider patient meets inpatient criteria.  Will be admitted.  No further work-up warranted in the ER at this time.  Annie Main voiced understanding of his medical evaluation and treatment plan.  She was questions was answered to his expressed satisfaction.  Patient is stable at this time.    This chart was dictated using voice recognition software, Dragon. Despite the best efforts of this provider to proofread and correct errors, errors may still occur which can change documentation meaning.  Final Clinical Impression(s) / ED Diagnoses Final diagnoses:  None    Rx / DC Orders ED Discharge Orders     None        Aura Dials 07/05/21 1737    Lacretia Leigh, MD 07/07/21 1556
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
## 2021-07-05 NOTE — ED Provider Notes (Signed)
I provided a substantive portion of the care of this patient.  I personally performed the entirety of the medical decision making for this encounter.  EKG Interpretation  Date/Time:  Sunday July 05 2021 10:22:52 EST Ventricular Rate:  99 PR Interval:  150 QRS Duration: 95 QT Interval:  330 QTC Calculation: 424 R Axis:   58 Text Interpretation: Sinus tachycardia Multiform ventricular premature complexes Nonspecific T abnormalities, lateral leads No significant change since last tracing Confirmed by Lorre Nick ([id number]) on 07/05/2021 10:68:49 AM   60 year old male here after intentional overdose of NyQuil and sleeping pills.  Patient will require medical clearance before being seen by behavioral health   Lorre Nick, MD 07/05/21 1059

## 2021-07-06 MED ORDER — BICTEGRAVIR-EMTRICITAB-TENOFOV 50-200-25 MG PO TABS
1.0000 | ORAL_TABLET | Freq: Every day | ORAL | Status: DC
  Administered 2021-07-06: 1 via ORAL
  Filled 2021-07-06 (×2): qty 1

## 2021-07-06 MED ORDER — GABAPENTIN 100 MG PO CAPS
100.0000 mg | ORAL_CAPSULE | Freq: Three times a day (TID) | ORAL | Status: DC
  Administered 2021-07-06 (×3): 100 mg via ORAL
  Filled 2021-07-06 (×3): qty 1

## 2021-07-06 MED ORDER — LORAZEPAM 1 MG PO TABS
1.0000 mg | ORAL_TABLET | Freq: Three times a day (TID) | ORAL | Status: DC | PRN
  Administered 2021-07-06: 1 mg via ORAL
  Filled 2021-07-06: qty 1

## 2021-07-06 MED ORDER — OLANZAPINE 10 MG PO TABS
10.0000 mg | ORAL_TABLET | Freq: Every day | ORAL | Status: DC
  Administered 2021-07-06: 10 mg via ORAL
  Filled 2021-07-06: qty 1

## 2021-07-06 MED ORDER — ESCITALOPRAM OXALATE 10 MG PO TABS
40.0000 mg | ORAL_TABLET | Freq: Every morning | ORAL | Status: DC
  Administered 2021-07-06: 40 mg via ORAL
  Filled 2021-07-06: qty 4

## 2021-07-06 NOTE — Discharge Instructions (Signed)
Please contact one of the following facilities to start medication management and therapy services:   [facility] [address] (SECOND FLOOR)  [place], [place]  [address] Phone: [phone]  [facility]  [address] [place], [place] [address] Phone: [phone]  [facility] [place]  [address]  [place], [place] [address]  [facility] [place]  [address]  [place], [place] [address] Phone: [phone]  

## 2021-07-06 NOTE — Consult Note (Signed)
  Patient continues to meet inpatient criteria after recent suicide attempt by ingestion. He continues to endorse ongoing suicidal ideations at this time. Will continue to refer to facilities for inpatient psychiatric admission. Marland Kitchen

## 2021-07-06 NOTE — ED Provider Notes (Signed)
Emergency Medicine Observation Re-evaluation Note  Tyler Horn is a 60 y.o. male, seen on rounds today.  Pt initially presented to the ED for complaints of Suicidal (Pt stated that he attempted suicide by ingesting two bottles of Nyquil and about 100 sleeping pills) Currently, the patient is asleep.  Physical Exam  BP 111/67 (BP Location: Left Arm)   Pulse 82   Temp 98 F (36.7 C) (Oral)   Resp 16   Ht 6\' 4"  (1.93 m)   Wt 87 kg   SpO2 96%   BMI 23.35 kg/m  Physical Exam General: NAD Cardiac: well perfused Lungs: even and unlabored Psych: No agitation  ED Course / MDM  EKG:EKG Interpretation  Date/Time:  Sunday July 05 2021 10:48:26 EST Ventricular Rate:  96 PR Interval:  153 QRS Duration: 95 QT Interval:  333 QTC Calculation: 421 R Axis:   53 Text Interpretation: Sinus rhythm Ventricular premature complex Nonspecific T abnormalities, lateral leads Confirmed by [id number] (Lorre Nick) on 07/05/2021 2:54:35 PM  I have reviewed the labs performed to date as well as medications administered while in observation.  Recent changes in the last 24 hours include evaluated by psychiatry, determined to meet inpatient criteria.  Plan  Current plan is for inpatient behavioral health admission. Social work 13/01/2021.  Tyler Horn is not under involuntary commitment.     Tyler Pih, MD 07/06/21 [phone]

## 2021-07-06 NOTE — Progress Notes (Addendum)
Pt was accepted to [facility] today 07/06/21 at 2000  Pt meets inpatient criteria per Caryn Bee, NP  Report can be called to: -Adult unit: [phone]  Attending:Stephanie Loleta Chance, MD   Pt can arrive after 2000  Care Team notified via secure chat: Caryn Bee, FNP, Kristine Royal, MD, Alben Deeds, EN, Gwenevere Ghazi, LCSWA, Rona Ravens, RN.    Maryjean Ka, MSW, [facility] 07/06/2021 5:41 PM

## 2021-07-06 NOTE — ED Notes (Signed)
Pt's resting comfortably, been calm and cooperative all day. Ambulatory with steady gait.

## 2021-07-06 NOTE — Progress Notes (Signed)
Patient information has been sent to [facility] Nurse via secure chat to review for potential admission. Patient has not yet been accepted at this time. Patient meets inpatient criteria per Caryn Bee, NP.   Situation ongoing, CSW will continue to monitor and update note as more information becomes available.    Signed:  Corky Crafts, MSW, [place], LCASA 07/06/2021 12:54 PM

## 2021-07-07 ENCOUNTER — Encounter (HOSPITAL_COMMUNITY): Payer: Self-pay | Admitting: Physician Assistant

## 2021-07-07 ENCOUNTER — Inpatient Hospital Stay (HOSPITAL_COMMUNITY)
Admission: AD | Admit: 2021-07-07 | Discharge: 2021-07-14 | DRG: 885 | Disposition: A | Discharge status: Home / Self Care | Payer: Federal, State, Local not specified - Other | Source: Intra-hospital | Attending: Psychiatry | Admitting: Psychiatry

## 2021-07-07 ENCOUNTER — Other Ambulatory Visit: Payer: Self-pay

## 2021-07-07 DIAGNOSIS — K219 Gastro-esophageal reflux disease without esophagitis: Secondary | ICD-10-CM | POA: Diagnosis present

## 2021-07-07 DIAGNOSIS — B20 Human immunodeficiency virus [HIV] disease: Secondary | ICD-10-CM | POA: Diagnosis present

## 2021-07-07 DIAGNOSIS — Z818 Family history of other mental and behavioral disorders: Secondary | ICD-10-CM | POA: Diagnosis not present

## 2021-07-07 DIAGNOSIS — F410 Panic disorder [episodic paroxysmal anxiety] without agoraphobia: Secondary | ICD-10-CM | POA: Diagnosis present

## 2021-07-07 DIAGNOSIS — B182 Chronic viral hepatitis C: Secondary | ICD-10-CM | POA: Diagnosis present

## 2021-07-07 DIAGNOSIS — G4700 Insomnia, unspecified: Secondary | ICD-10-CM | POA: Diagnosis present

## 2021-07-07 DIAGNOSIS — Z79899 Other long term (current) drug therapy: Secondary | ICD-10-CM | POA: Diagnosis not present

## 2021-07-07 DIAGNOSIS — R682 Dry mouth, unspecified: Secondary | ICD-10-CM | POA: Diagnosis present

## 2021-07-07 DIAGNOSIS — F333 Major depressive disorder, recurrent, severe with psychotic symptoms: Secondary | ICD-10-CM | POA: Diagnosis present

## 2021-07-07 DIAGNOSIS — F332 Major depressive disorder, recurrent severe without psychotic features: Secondary | ICD-10-CM | POA: Diagnosis present

## 2021-07-07 DIAGNOSIS — F411 Generalized anxiety disorder: Secondary | ICD-10-CM | POA: Diagnosis present

## 2021-07-07 DIAGNOSIS — H9190 Unspecified hearing loss, unspecified ear: Secondary | ICD-10-CM | POA: Diagnosis present

## 2021-07-07 DIAGNOSIS — N400 Enlarged prostate without lower urinary tract symptoms: Secondary | ICD-10-CM | POA: Diagnosis present

## 2021-07-07 DIAGNOSIS — R45851 Suicidal ideations: Secondary | ICD-10-CM | POA: Diagnosis present

## 2021-07-07 LAB — TSH: TSH: 1.015 u[IU]/mL (ref 0.350–4.500)

## 2021-07-07 LAB — LIPID PANEL
Cholesterol: 174 mg/dL (ref 0–200)
HDL: 32 mg/dL — ABNORMAL LOW (ref >40)
LDL Cholesterol: 123 mg/dL — ABNORMAL HIGH (ref 0–99)
Total CHOL/HDL Ratio: 5.4 RATIO
Triglycerides: 95 mg/dL (ref <150)
VLDL: 19 mg/dL (ref 0–40)

## 2021-07-07 LAB — HEMOGLOBIN A1C
Hgb A1c MFr Bld: 5.2 % (ref 4.8–5.6)
Mean Plasma Glucose: 102.54 mg/dL

## 2021-07-07 MED ORDER — ALUM & MAG HYDROXIDE-SIMETH 200-200-20 MG/5ML PO SUSP
30.0000 mL | ORAL | Status: DC | PRN
  Administered 2021-07-12: 30 mL via ORAL
  Filled 2021-07-07: qty 30

## 2021-07-07 MED ORDER — TRAZODONE HCL 50 MG PO TABS
50.0000 mg | ORAL_TABLET | Freq: Every evening | ORAL | Status: DC | PRN
  Administered 2021-07-08: 50 mg via ORAL
  Filled 2021-07-07: qty 1

## 2021-07-07 MED ORDER — GABAPENTIN 100 MG PO CAPS
100.0000 mg | ORAL_CAPSULE | Freq: Three times a day (TID) | ORAL | Status: DC
  Administered 2021-07-07 – 2021-07-09 (×6): 100 mg via ORAL
  Filled 2021-07-07 (×12): qty 1

## 2021-07-07 MED ORDER — ESCITALOPRAM OXALATE 20 MG PO TABS
40.0000 mg | ORAL_TABLET | Freq: Every morning | ORAL | Status: DC
  Filled 2021-07-07: qty 4
  Filled 2021-07-07 (×2): qty 2

## 2021-07-07 MED ORDER — BICTEGRAVIR-EMTRICITAB-TENOFOV 50-200-25 MG PO TABS
1.0000 | ORAL_TABLET | Freq: Every day | ORAL | Status: DC
  Administered 2021-07-07 – 2021-07-14 (×8): 1 via ORAL
  Filled 2021-07-07 (×11): qty 1

## 2021-07-07 MED ORDER — HYDROXYZINE HCL 25 MG PO TABS
25.0000 mg | ORAL_TABLET | Freq: Three times a day (TID) | ORAL | Status: DC | PRN
  Administered 2021-07-08 – 2021-07-13 (×3): 25 mg via ORAL
  Filled 2021-07-07 (×4): qty 1

## 2021-07-07 MED ORDER — ESCITALOPRAM OXALATE 20 MG PO TABS: 20.0000 mg | ORAL_TABLET | Freq: Every morning | ORAL | Status: DC

## 2021-07-07 MED ORDER — SERTRALINE HCL 25 MG PO TABS
25.0000 mg | ORAL_TABLET | Freq: Every day | ORAL | Status: AC
  Administered 2021-07-07: 25 mg via ORAL
  Filled 2021-07-07: qty 1

## 2021-07-07 MED ORDER — ACETAMINOPHEN 325 MG PO TABS: 650.0000 mg | ORAL_TABLET | Freq: Four times a day (QID) | ORAL | Status: DC | PRN

## 2021-07-07 MED ORDER — OLANZAPINE 10 MG PO TABS
10.0000 mg | ORAL_TABLET | Freq: Every day | ORAL | Status: DC
  Filled 2021-07-07: qty 1

## 2021-07-07 MED ORDER — MAGNESIUM HYDROXIDE 400 MG/5ML PO SUSP
30.0000 mL | Freq: Every day | ORAL | Status: DC | PRN
  Administered 2021-07-07: 30 mL via ORAL
  Filled 2021-07-07: qty 30

## 2021-07-07 MED ORDER — QUETIAPINE FUMARATE 100 MG PO TABS
100.0000 mg | ORAL_TABLET | Freq: Every day | ORAL | Status: DC
  Administered 2021-07-08: 100 mg via ORAL
  Filled 2021-07-07 (×3): qty 1

## 2021-07-07 MED ORDER — SERTRALINE HCL 50 MG PO TABS
50.0000 mg | ORAL_TABLET | Freq: Every day | ORAL | Status: DC
  Administered 2021-07-08 – 2021-07-14 (×7): 50 mg via ORAL
  Filled 2021-07-07 (×10): qty 1

## 2021-07-07 NOTE — Tx Team (Signed)
Initial Treatment Plan 07/07/2021 3:37 AM Tyler Horn [id number]    PATIENT STRESSORS: Health problems   Medication change or noncompliance     PATIENT STRENGTHS: Ability for insight  Average or above average intelligence  Communication skills  Motivation for treatment/growth    PATIENT IDENTIFIED PROBLEMS: " I took approximately 100 sleeping tablets and 2 bottles of Nyquil  Anxiety   Depression                 DISCHARGE CRITERIA:  Ability to meet basic life and health needs Improved stabilization in mood, thinking, and/or behavior Motivation to continue treatment in a less acute level of care Need for constant or close observation no longer present Reduction of life-threatening or endangering symptoms to within safe limits Verbal commitment to aftercare and medication compliance  PRELIMINARY DISCHARGE PLAN: Outpatient therapy Return to previous living arrangement  PATIENT/FAMILY INVOLVEMENT: This treatment plan has been presented to and reviewed with the patient, Tyler Horn, and/or family member.  The patient and family have been given the opportunity to ask questions and make suggestions.  Margarita Rana, RN 07/07/2021, 3:37 AM

## 2021-07-07 NOTE — BHH Group Notes (Signed)
Patient did not attend group.

## 2021-07-07 NOTE — Group Note (Signed)
Recreation Therapy Group Note   Group Topic:Animal Assisted Therapy   Group Date: 07/07/2021 Start Time: 1430 End Time: 1500 Facilitators: Caroll Rancher, LRT/CTRS Location: [address]   AAA/T Program Assumption of Risk Form signed by Patient/ or Parent Legal Guardian Yes  Patient understands his/her participation is voluntary Yes   Affect/Mood: N/A   Participation Level: Did not attend    Clinical Observations/Individualized Feedback: Pt did not attend group.    Plan: Continue to engage patient in RT group sessions 2-3x/week.   Caroll Rancher, LRT/CTRS 07/07/2021 3:29 PM
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
## 2021-07-07 NOTE — ED Notes (Signed)
[facility] called for transport.  ?

## 2021-07-07 NOTE — [facility] Suicide Risk Assessment (Signed)
[facility] Admission Suicide Risk Assessment   Nursing information obtained from:  Patient Demographic factors:  Caucasian, Living alone Current Mental Status:  Suicidal ideation indicated by patient Loss Factors:  Loss of significant relationship Historical Factors:  Prior suicide attempts, Impulsivity Risk Reduction Factors:  Positive coping skills or problem solving skills  Total Time spent with patient: 30 minutes Principal Problem: MDD (major depressive disorder), recurrent, severe, with psychosis ([place]) Diagnosis:  Principal Problem:   MDD (major depressive disorder), recurrent, severe, with psychosis ([place]) Active Problems:   Suicidal thoughts   Human immunodeficiency virus infection ([place])  Subjective Data:   Patient is a 5-year-old male with a psychiatric history of major depressive disorder versus bipolar disorder, generalized anxiety disorder, panic attacks, who was admitted to the psychiatric unit for evaluation for evaluation and treatment of worsening depression and suicide attempt via overdose on Tylenol PM.  Patient was interviewed by a sign language interpreter, as he is deaf.  Patient was interviewed with Education officer, museum and Designer, jewellery.  Patient case was discussed with interdisciplinary team today.  On initial evaluation today, the patient provides inconsistent information throughout the interview, contradicting himself throughout the interview.  Overall, it appears the patient had not been taking his prescribed psychiatric medication for a few weeks.  It is unclear why the patient was not taking his medications, overtaking medications due to poor understanding of medication instructions versus having stopped them on his own.  The patient reports having overdosed, due to feeling lonely and depressed.  The patient goes back and forth on whether his medications were working to treat his depressive symptoms or not. The patient does report that his mood is down, depressed, sad,  hopeless, helpless, and lonely.  He reports anhedonia.  He reports poor sleep, which coincides with worsening depression.  He reports appetite is up and down.  Reports poor concentration.  Reports confusion, over the last few weeks, which coincides with worsening depression.   He reports having suicidal thoughts, which are passive, without intent or plan.  Denies any homicidal thoughts.  Denies having auditory and visual hallucinations.  He reports that in the past, when he was very depressed, he had visual hallucinations.  He reports that anxiety level is very high.  Reports having panic attacks. Patient reports history of multiple psychiatric hospitalizations and history of suicide attempts. Reports living on his own apartment, with financial assistance. Patient has outpatient psychiatric provider.  Patient reports that her medications, prior to this hospitalization were: Lexapro 40 mg once a day Olanzapine 10 mg nightly ?  Ativan   Continued Clinical Symptoms:  Alcohol Use Disorder Identification Test Final Score (AUDIT): 0 The "Alcohol Use Disorders Identification Test", Guidelines for Use in Primary Care, Second Edition.  World Pharmacologist [facility]). Score between 0-7:  no or low risk or alcohol related problems. Score between 8-15:  moderate risk of alcohol related problems. Score between 16-19:  high risk of alcohol related problems. Score 20 or above:  warrants further diagnostic evaluation for alcohol dependence and treatment.   CLINICAL FACTORS:   Severe Anxiety and/or Agitation Panic Attacks Depression:   Comorbid alcohol abuse/dependence Hopelessness Impulsivity Insomnia More than one psychiatric diagnosis   Musculoskeletal: Strength & Muscle Tone: within normal limits Gait & Station: Did not observe the patient will today Patient leans: N/A  Psychiatric Specialty Exam:  Presentation  General Appearance: Disheveled (Any information, that required communication,  was gathered by a sign language interpreter)  Eye Contact:Fleeting  Speech:-- (Sign language through interpreter)  Speech Volume:Other (comment) (patient is deaf)  Handedness:Right   Mood and Affect  Mood:Depressed; Dysphoric; Anxious; Worthless; Hopeless  Affect:Constricted   Thought Process  Thought Processes:Linear  Descriptions of Associations:Intact  Orientation:Full (Time, Place and Person)  Thought Content:Logical  History of Schizophrenia/Schizoaffective disorder:No  Duration of Psychotic Symptoms:N/A (patient denies delusions, hallucinations and paranoia)  Hallucinations:Hallucinations: None  Ideas of Reference:None  Suicidal Thoughts:Suicidal Thoughts: Yes, Passive SI Passive Intent and/or Plan: Without Intent; Without Plan  Homicidal Thoughts:Homicidal Thoughts: No   Sensorium  Memory:Immediate Good; Remote Good; Recent Good  Judgment:Poor  Insight:Poor   Executive Functions  Concentration:Poor  Attention Span:Poor  Crystal:-- (Sign language via interpreter)   Psychomotor Activity  Psychomotor Activity:Psychomotor Activity: Normal   Assets  Assets:Communication Skills; Desire for Improvement; Financial Resources/Insurance; Housing; Resilience; Social Support; Vocational/Educational   Sleep  Sleep:Sleep: Poor Number of Hours of Sleep: 4.5    Physical Exam: Physical Exam see H&P ROS see H&P  Blood pressure 116/85, pulse 78, temperature 97.8 F (36.6 C), temperature source Oral, resp. rate 18, height 6\' 4"  (1.93 m), weight 88.5 kg, SpO2 100 %. Body mass index is 23.74 kg/m.   COGNITIVE FEATURES THAT CONTRIBUTE TO RISK:  None    SUICIDE RISK:   Moderate:  Frequent suicidal ideation with limited intensity, and duration, some specificity in terms of plans, no associated intent, good self-control, limited dysphoria/symptomatology, some risk factors present, and identifiable protective  factors, including available and accessible social support.  PLAN OF CARE:   ASSESSMENT:  Diagnoses / Active Problems: Major depressive disorder, recurrent, severe, without psychotic features Generalized anxiety disorder Panic disorder Insomnia  PLAN: Safety and Monitoring:  -- Voluntary admission to inpatient psychiatric unit for safety, stabilization and treatment  -- Daily contact with patient to assess and evaluate symptoms and progress in treatment  -- Patient's case to be discussed in multi-disciplinary team meeting  -- Observation Level : q15 minute checks  -- Vital signs:  q12 hours  -- Precautions: suicide, elopement, and assault  2. Psychiatric Diagnoses and Treatment:    Stop Lexapro 40 mg once daily Start Zoloft 25 mg once daily, for depression, anxiety, and panic attacks.  Can increase dose during hospitalization. Stop Zyprexa 10 mg nightly Start Seroquel 100 mg nightly, for mood, anxiety, and insomnia.  Patient reports he prefers Seroquel to Zyprexa, for treating the aforementioned symptoms.  Can increase dose during hospitalization Continue other medications as ordered  --  The risks/benefits/side-effects/alternatives to this medication were discussed in detail with the patient and time was given for questions. The patient consents to medication trial.    -- Metabolic profile and EKG monitoring obtained while on an atypical antipsychotic (BMI: Lipid Panel: HbgA1c: QTc:) 421  -- Encouraged patient to participate in unit milieu and in scheduled group therapies   -- Short Term Goals: (Verbalize = communicate by interpreter) Ability to identify changes in lifestyle to reduce recurrence of condition will improve, Ability to verbalize feelings will improve, Ability to disclose and discuss suicidal ideas, Ability to demonstrate self-control will improve, Ability to identify and develop effective coping behaviors will improve, Ability to maintain clinical measurements within  normal limits will improve, Compliance with prescribed medications will improve, and Ability to identify triggers associated with substance abuse/mental health issues will improve  -- Long Term Goals: Improvement in symptoms so as ready for discharge   3. Medical Issues Being Addressed:   HIV-continue Biktarvy  4. Discharge Planning:   -- Social work and  case management to assist with discharge planning and identification of hospital follow-up needs prior to discharge  -- Estimated LOS: 5-7 days  -- Discharge Concerns: Need to establish a safety plan; Medication compliance and effectiveness  -- Discharge Goals: Return home with outpatient referrals for mental health follow-up including medication management/psychotherapy       I certify that inpatient services furnished can reasonably be expected to improve the patient's condition.   Cristy Hilts, MD 07/07/2021, 12:47 PM

## 2021-07-07 NOTE — ED Notes (Signed)
Report given to [facility] Nurse, Ellwood Handler, RN

## 2021-07-07 NOTE — H&P (Signed)
Psychiatric Admission Assessment Adult  Patient Identification: Tyler Horn MRN:  [id number] Date of Evaluation:  07/07/2021 Chief Complaint:  MDD (major depressive disorder), recurrent, severe, with psychosis ([place]) [F33.3] Principal Diagnosis: MDD (major depressive disorder), recurrent, severe, with psychosis ([place]) Diagnosis:  Principal Problem:   MDD (major depressive disorder), recurrent, severe, with psychosis ([place]) Active Problems:   Suicidal thoughts   Human immunodeficiency virus infection ([place])  History of Present Illness: Patient was seen with Dr Caswell Corwin, [place] and ASL interpreter. This represents one of several [facility] inpatient admissions for this patient. Tyler Horn is a 60 year old male who is deaf and lives alone in an apartment. He is a retired Education officer, museum. He has outpatient medication management services with [name], PA at [facility] and therapy with Jessica Priest at [facility]. His last medication management appointment was on 10/11, his next appointment is on 12/6. Patient is known to this facility and was recently admitted in September for the same or similar presentation. Patient stated he took 100 OTC sleeping pills and drank 2 bottles of Nyquil in an effort to end his life. He stated he could not get his anxiety medications and his next medication management appointment is too far away. He last saw his medication management provider on 10/11 and his next appointment is 12/6. During this appointment his Lexapro was increased, he was provided with a prescription for 30 day supply with 1 refill and a 30 day with 1 refill prescription for olanzapine. He stated to this provider that he ran out of his medications, he later told the attending psychiatrist that he was not taking it correctly due to his racing thoughts. When questioned about what medications he currently takes, he stated he is taking olanzapine and Ativan. He was reminded that he is also taking  Lexapro. When questioned about an active prescription for Ativan from his outpatient provider he stated "they give me that when I am in the hospital, they told me they won't give it to me out there." Patient was informed that we use Vistaril for anxiety and we will not be prescribing Ativan during this hospitalization. Of note, patient has an FYI in his chart requesting he not be given benzodiazepines when he presents to the emergency room. Patient has had 7 emergency room visits since September, each time requesting Ativan. Patient appears to have secondary gain by seeking benzodiazepines in the emergency room or the inpatient hospital.   Patient lives alone and stated he often feels sad because nobody loves or cherishes him. He stated he feels hopeless, helpless, sad, lonely and anxious. He listed his goal for treatment is to get his medications right. He is agreeable to switching his medications. He stated he has been on Lexapro for 10 years. We will switch to Zoloft for his anxiety and depression and discontinue olanzapine and restart Seroquel 100 mg and titrate up for effectiveness. He stated he has better mood control and sleep on Seroquel. He endorses good sleep and a good appetite. He denies HI/AVH, paranoia and delusions. He endorses suicidal ideation but currently has no plan and agrees to keep himself safe in the hospital. He does not appear to be responding to internal stimuli.   Patient denies allergies to medications. He denies medical problems with the exception of HIV, which he takes medication for and is followed by [facility].   Associated Signs/Symptoms: Depression Symptoms:  depressed mood, feelings of worthlessness/guilt, hopelessness, suicidal attempt, anxiety, Sadness, loneliness, and helpless Duration  of Depression Symptoms: Greater than two weeks  (Hypo) Manic Symptoms:   patient displays or lists no manic symptoms or tendencies Anxiety Symptoms:  Excessive Worry, Psychotic  Symptoms:  Hallucinations: None PTSD Symptoms: NA Total Time spent with patient: 1 hour  Past Psychiatric History: Major depressive disorder with psychosis  Is the patient at risk to self? Yes.    Has the patient been a risk to self in the past 6 months? Yes.    Has the patient been a risk to self within the distant past? Yes.    Is the patient a risk to others? No.  Has the patient been a risk to others in the past 6 months? No.  Has the patient been a risk to others within the distant past? No.   Prior Inpatient Therapy:  Yes: [facility] ([place] 9/22, 2/22, [facility] 3/21 Prior Outpatient Therapy:  Yes  Alcohol Screening: Patient refused Alcohol Screening Tool: Yes 1. How often do you have a drink containing alcohol?: Never 2. How many drinks containing alcohol do you have on a typical day when you are drinking?: 1 or 2 3. How often do you have six or more drinks on one occasion?: Never AUDIT-C Score: 0 4. How often during the last year have you found that you were not able to stop drinking once you had started?: Never 5. How often during the last year have you failed to do what was normally expected from you because of drinking?: Never 6. How often during the last year have you needed a first drink in the morning to get yourself going after a heavy drinking session?: Never 7. How often during the last year have you had a feeling of guilt of remorse after drinking?: Never 8. How often during the last year have you been unable to remember what happened the night before because you had been drinking?: Never 9. Have you or someone else been injured as a result of your drinking?: No 10. Has a relative or friend or a doctor or another health worker been concerned about your drinking or suggested you cut down?: No Alcohol Use Disorder Identification Test Final Score (AUDIT): 0 Substance Abuse History in the last 12 months:  No. Consequences of Substance Abuse: NA Previous Psychotropic Medications: Yes   Psychological Evaluations: Yes  Past Medical History:  Past Medical History:  Diagnosis Date   Anxiety    Chronic hepatitis C ([place]) 04/27/2020   Depression    HIV (human immunodeficiency virus infection) ([place])     Past Surgical History:  Procedure Laterality Date   CATARACT EXTRACTION Left    Family History:  Family History  Problem Relation Age of Onset   Mental illness Mother    Bipolar disorder Brother    Family Psychiatric  History: Denies knowing of any family history Tobacco Screening:   Social History:  Social History   Substance and Sexual Activity  Alcohol Use No     Social History   Substance and Sexual Activity  Drug Use No    Additional Social History:   Allergies:  No Known Allergies Lab Results:  Results for orders placed or performed during the hospital encounter of 07/07/21 (from the past 48 hour(s))  Lipid panel     Status: Abnormal   Collection Time: 07/07/21  6:26 AM  Result Value Ref Range   Cholesterol 174 0 - 200 mg/dL   Triglycerides 95 <150 mg/dL   HDL 32 (L) >40 mg/dL   Total CHOL/HDL Ratio 5.4 RATIO  VLDL 19 0 - 40 mg/dL   LDL Cholesterol 123 (H) 0 - 99 mg/dL    Comment:        Total Cholesterol/HDL:CHD Risk Coronary Heart Disease Risk Table                     Men   Women  1/2 Average Risk   3.4   3.3  Average Risk       5.0   4.4  2 X Average Risk   9.6   7.1  3 X Average Risk  23.4   11.0        Use the calculated Patient Ratio above and the CHD Risk Table to determine the patient's CHD Risk.        ATP III CLASSIFICATION (LDL):  <100     mg/dL   Optimal  100-129  mg/dL   Near or Above                    Optimal  130-159  mg/dL   Borderline  160-189  mg/dL   High  >190     mg/dL   Very High Performed at [facility] [address], [place], [place] [address]   TSH     Status: None   Collection Time: 07/07/21  6:26 AM  Result Value Ref Range   TSH 1.015 0.350 - 4.500 uIU/mL    Comment: Performed  by a 3rd Generation assay with a functional sensitivity of <=0.01 uIU/mL. Performed at [facility], [place] [address], [place], [place] [address]   Hemoglobin A1c     Status: None   Collection Time: 07/07/21  6:26 AM  Result Value Ref Range   Hgb A1c MFr Bld 5.2 4.8 - 5.6 %    Comment: (NOTE) Pre diabetes:          5.7%-6.4%  Diabetes:              >6.4%  Glycemic control for   <7.0% adults with diabetes    Mean Plasma Glucose 102.54 mg/dL    Comment: Performed at [facility] [address], [place], [place] [address]    Blood Alcohol level:  Lab Results  Component Value Date   [facility] <10 07/05/2021   ETH <10 [id number]    Metabolic Disorder Labs:  Lab Results  Component Value Date   HGBA1C 5.2 07/07/2021   MPG 102.54 07/07/2021   MPG 96.8 05/06/2021   No results found for: PROLACTIN Lab Results  Component Value Date   CHOL 174 07/07/2021   TRIG 95 07/07/2021   HDL 32 (L) 07/07/2021   CHOLHDL 5.4 07/07/2021   VLDL 19 07/07/2021   LDLCALC 123 (H) 07/07/2021   LDLCALC 90 05/06/2021    Current Medications: Current Facility-Administered Medications  Medication Dose Route Frequency Provider Last Rate Last Admin   acetaminophen (TYLENOL) tablet 650 mg  650 mg Oral Q6H PRN Nwoko, Uchenna E, PA       alum & mag hydroxide-simeth (MAALOX/MYLANTA) 200-200-20 MG/5ML suspension 30 mL  30 mL Oral Q4H PRN Nwoko, Uchenna E, PA       bictegravir-emtricitabine-tenofovir AF (BIKTARVY) 50-200-25 MG per tablet 1 tablet  1 tablet Oral Daily Margorie John W, PA-C   1 tablet at 07/07/21 0901   gabapentin (NEURONTIN) capsule 100 mg  100 mg Oral TID Prescilla Sours, PA-C   100 mg at 07/07/21 0901   hydrOXYzine (ATARAX/VISTARIL) tablet 25  mg  25 mg Oral TID PRN Nwoko, Uchenna E, PA       magnesium hydroxide (MILK OF MAGNESIA) suspension 30 mL  30 mL Oral Daily PRN Nwoko, Uchenna E, PA       OLANZapine (ZYPREXA) tablet 10 mg  10 mg Oral QHS Lovena Le, Cody W, PA-C        traZODone (DESYREL) tablet 50 mg  50 mg Oral QHS PRN Nwoko, Uchenna E, PA       PTA Medications: Medications Prior to Admission  Medication Sig Dispense Refill Last Dose   bictegravir-emtricitabine-tenofovir AF (BIKTARVY) 50-200-25 MG TABS tablet Take 1 tablet by mouth daily. 30 tablet 5    Cholecalciferol (VITAMIN D3 PO) Take 4 capsules by mouth every morning.      escitalopram (LEXAPRO) 20 MG tablet Take 1 tablet (20 mg total) by mouth daily. Patient to take 1 tablet (20 mg total) for 6 days followed by 2 tablets (40 mg total) daily (Patient taking differently: Take 40 mg by mouth every morning.) 30 tablet 1    gabapentin (NEURONTIN) 100 MG capsule Take 1 capsule (100 mg total) by mouth 3 (three) times daily. (Patient not taking: Reported on 07/05/2021) 3 capsule 0    LORazepam (ATIVAN) 1 MG tablet Take 1 tablet (1 mg total) by mouth 3 (three) times daily as needed for anxiety. (Patient not taking: No sig reported) 3 tablet 0    naproxen sodium (ALEVE) 220 MG tablet Take 220 mg by mouth 2 (two) times daily as needed (headache).      OLANZapine (ZYPREXA) 10 MG tablet Take 1 tablet (10 mg total) by mouth at bedtime. 30 tablet 1    Omega-3 Fatty Acids (FISH OIL PO) Take 1 capsule by mouth every morning.      OVER THE COUNTER MEDICATION Take 25-50 mg by mouth at bedtime as needed (sleep). Unisom - doxylamine or diphenhydramine      Pseudoeph-Doxylamine-DM-APAP (NYQUIL PO) Take 2 tablets by mouth at bedtime.      tamsulosin (FLOMAX) 0.4 MG CAPS capsule Take 0.4 mg by mouth daily. (Patient not taking: No sig reported)       Musculoskeletal: Strength & Muscle Tone: within normal limits Gait & Station: normal Patient leans: N/A  Psychiatric Specialty Exam:  Presentation  General Appearance: Appropriate for Environment; Disheveled  Eye Contact:Good  Speech:Other (comment) (patient is deaf, needs an interpreter)  Speech Volume:Other (comment) (patient is deaf)  Handedness:Right  Mood and  Affect  Mood:Anxious; Depressed; Hopeless  Affect:Congruent; Depressed  Thought Process  Thought Processes:Coherent; Goal Directed  Duration of Psychotic Symptoms: N/A (patient denies delusions, hallucinations and paranoia)  Past Diagnosis of Schizophrenia or Psychoactive disorder: No  Descriptions of Associations:Intact  Orientation:Full (Time, Place and Person)  Thought Content:Logical  Hallucinations:Hallucinations: None Ideas of Reference:None; Other (comment) (patient denies)  Suicidal Thoughts:Suicidal Thoughts: Yes, Passive (patient denies intent or plan, is able to contract for safety in the hospital) SI Passive Intent and/or Plan: Without Intent; Without Plan Homicidal Thoughts:Homicidal Thoughts: No  Sensorium  Memory:Immediate Fair; Recent Fair; Remote Fair  Judgment:Poor (impulsive)  Insight:Poor  Executive Functions  Concentration:Good  Attention Span:Good  [place]; Other (comment) (patient is deaf, needs an interpreter)  Psychomotor Activity  Psychomotor Activity:Psychomotor Activity: Normal  Assets  Assets:Communication Skills; Desire for Improvement; Financial Resources/Insurance; Housing; Resilience; Social Support; Vocational/Educational  Sleep  Sleep:Sleep: Fair Number of Hours of Sleep: 4.5  Physical Exam: Physical Exam Vitals and nursing note reviewed.  Constitutional:  Appearance: Normal appearance.  HENT:     Head: Normocephalic and atraumatic.  Pulmonary:     Effort: Pulmonary effort is normal.  Musculoskeletal:        General: Normal range of motion.     Cervical back: Normal range of motion.  Neurological:     General: No focal deficit present.     Mental Status: He is alert and oriented to person, place, and time.  Psychiatric:        Attention and Perception: Attention and perception normal. He does not perceive auditory or visual hallucinations.        Mood and Affect:  Mood is anxious and depressed.        Behavior: Behavior normal. Behavior is cooperative.        Thought Content: Thought content is not paranoid or delusional. Thought content includes suicidal ideation. Thought content does not include homicidal ideation. Thought content does not include homicidal or suicidal plan.        Cognition and Memory: Cognition normal.        Judgment: Judgment is impulsive.     Comments: Patient is deaf, needs interpreter   Review of Systems  Constitutional: Negative.  Negative for fever.  HENT:  Negative for congestion, sinus pain and sore throat.        Patient is deaf, needs interpreter  Respiratory: Negative.  Negative for cough and shortness of breath.   Cardiovascular: Negative.  Negative for chest pain.  Musculoskeletal: Negative.   Neurological: Negative.    Blood pressure 116/85, pulse 78, temperature 97.8 F (36.6 C), temperature source Oral, resp. rate 18, height 6\' 4"  (1.93 m), weight 88.5 kg, SpO2 100 %. Body mass index is 23.74 kg/m.   Observation Level/Precautions:  15 minute checks  Laboratory:  Labs reviewed (drawn on 07/05/2021): CMP with sodium 134, CO2 18, Glucose 152, BUN 22, Creatinine 1.30, Total protein 8.4. CBC with diff with WBC 12.2, Hgb 18.0, HCT 53.1, NEUT# 9.5. Lipid profile with HDL 32 and LDL 123. Acetaminophen level Q000111Q, Salicylate level Q000111Q. A1c 5.2. TSH 1.015.  Labs ordered: Repeat CBC with diff and CMP.   Psychotherapy:  Group Therapy  Medications:  See MAR  Consultations:  TBD  Discharge Concerns:  Safety, suicidal ideation with plan and intent  Estimated LOS: 3-5 days  Other:     Treatment Plan Summary: Daily contact with patient to assess and evaluate symptoms and progress in treatment and Medication management  MDD, recurrent severe without psychosis:  Discontinue Lexapro  Initiate Zoloft 25 mg PO daily x 1 day, then increase to 50 mg daily starting on day 2. Discontinue Zyprexa Initiate Seroquel 100 mg PO at  bedtime and titrate for effectiveness  Anxiety:  Gabapentin 100 mg PO TID  Vistaril 25 mg PO TID PRN for anxiety  Insomnia:  Trazodone 50 mg PO at bedtime PRN Seroquel 100 mg PO at bedtime   Medical problems addressed during this hospitalization: HIV Restart Biktarvy 50-200-25 mg PO daily  Other PRN medications:  Maalox 30 mL PO every 4 hours for indigestion Milk of magnesia 30 mL PO  Tylenol 650 MG PO every 6 hours as needed for mild pain  Physician Treatment Plan for Primary Diagnosis: MDD (major depressive disorder), recurrent, severe, with psychosis ([place]) Long Term Goal(s): Improvement in symptoms so as ready for discharge  Short Term Goals: Ability to identify changes in lifestyle to reduce recurrence of condition will improve, Ability to verbalize feelings will improve, Ability to disclose and  discuss suicidal ideas, Ability to identify and develop effective coping behaviors will improve, Compliance with prescribed medications will improve, and Ability to identify triggers associated with substance abuse/mental health issues will improve  Physician Treatment Plan for Secondary Diagnosis: Principal Problem:   MDD (major depressive disorder), recurrent, severe, with psychosis ([facility]) Active Problems:   Suicidal thoughts   Human immunodeficiency virus infection ([facility])  Long Term Goal(s): Improvement in symptoms so as ready for discharge  Short Term Goals: Ability to identify changes in lifestyle to reduce recurrence of condition will improve, Ability to verbalize feelings will improve, Ability to disclose and discuss suicidal ideas, Ability to demonstrate self-control will improve, Ability to maintain clinical measurements within normal limits will improve, and Ability to identify triggers associated with substance abuse/mental health issues will improve  I certify that inpatient services furnished can reasonably be expected to improve the patient's condition.    Laveda Abbe, NP 11/8/20224:14 PM
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
## 2021-07-07 NOTE — Plan of Care (Signed)
Cooperative ad able to communicate his need by writing. Taking his scheduled medications and eating fine. Denis SI/HI/AVH. No sign of distress.

## 2021-07-07 NOTE — [facility] Group Notes (Signed)
Patient di not attend group.

## 2021-07-07 NOTE — [facility] Group Notes (Signed)
[facility] Group Notes:  (Nursing/MHT/Case Management/Adjunct)  Date:  07/07/2021  Time:  8:50 PM  Type of Therapy:  Psychoeducational Skills  Participation Level:  None  Participation Quality:  Resistant  Affect:  Resistant  Cognitive:  Lacking  Insight:  None  Engagement in Group:  None  Modes of Intervention:  Education  Summary of Progress/Problems: The patient did not attend group this evening.   Hazle Coca S 07/07/2021, 8:50 PM

## 2021-07-08 ENCOUNTER — Encounter (HOSPITAL_COMMUNITY): Payer: Self-pay

## 2021-07-08 LAB — COMPREHENSIVE METABOLIC PANEL
ALT: 14 U/L (ref 0–44)
AST: 14 U/L — ABNORMAL LOW (ref 15–41)
Albumin: 4.1 g/dL (ref 3.5–5.0)
Alkaline Phosphatase: 90 U/L (ref 38–126)
Anion gap: 8 (ref 5–15)
BUN: 24 mg/dL — ABNORMAL HIGH (ref 6–20)
CO2: 28 mmol/L (ref 22–32)
Calcium: 9.4 mg/dL (ref 8.9–10.3)
Chloride: 104 mmol/L (ref 98–111)
Creatinine, Ser: 1.03 mg/dL (ref 0.61–1.24)
GFR, Estimated: >60 mL/min (ref >60)
Glucose, Bld: 98 mg/dL (ref 70–99)
Potassium: 4.1 mmol/L (ref 3.5–5.1)
Sodium: 140 mmol/L (ref 135–145)
Total Bilirubin: 0.6 mg/dL (ref 0.3–1.2)
Total Protein: 7.7 g/dL (ref 6.5–8.1)

## 2021-07-08 LAB — CBC
HCT: 50.5 % (ref 39.0–52.0)
Hemoglobin: 16.8 g/dL (ref 13.0–17.0)
MCH: 31.3 pg (ref 26.0–34.0)
MCHC: 33.3 g/dL (ref 30.0–36.0)
MCV: 94.2 fL (ref 80.0–100.0)
Platelets: 372 10*3/uL (ref 150–400)
RBC: 5.36 MIL/uL (ref 4.22–5.81)
RDW: 13.6 % (ref 11.5–15.5)
WBC: 10.5 10*3/uL (ref 4.0–10.5)
nRBC: 0 % (ref 0.0–0.2)

## 2021-07-08 MED ORDER — BUSPIRONE HCL 5 MG PO TABS
5.0000 mg | ORAL_TABLET | Freq: Three times a day (TID) | ORAL | Status: DC
  Administered 2021-07-08 – 2021-07-14 (×19): 5 mg via ORAL
  Filled 2021-07-08 (×25): qty 1

## 2021-07-08 MED ORDER — HYDROXYZINE HCL 25 MG PO TABS
25.0000 mg | ORAL_TABLET | Freq: Once | ORAL | Status: AC
  Administered 2021-07-08: 25 mg via ORAL

## 2021-07-08 MED ORDER — QUETIAPINE FUMARATE 25 MG PO TABS
25.0000 mg | ORAL_TABLET | Freq: Every day | ORAL | Status: DC
  Administered 2021-07-08: 25 mg via ORAL
  Filled 2021-07-08 (×2): qty 1

## 2021-07-08 NOTE — Progress Notes (Signed)
[facility] MD Progress Note  07/08/2021 3:12 PM Tyler Horn  MRN:  [id number]  Subjective:  "I feel very anxious today"   Objective: This represents one of several [facility] inpatient admissions for this patient. Tyler Horn is a 60 year old male who is deaf and lives alone in an apartment. He is a retired Education officer, museum. Patient presented to [facility] on a voluntary basis with complaint of suicide attempt and continued suicidal ideation. He stated he took 100 OTC sleeping pills and drank 2 bottles of Nyquil in an attempt to end his life.   Evaluation on the unit today 11/9: Patient is seen in his room, ASL interpreter was present during evaluation. Chart reviewed and case discussed in treatment team this morning. Gerry stated he did not sleep well last night because he did not get his Seroquel. He stated he was asleep and no one woke him up to give him medication. He also stated no one woke him up for dinner. Record reflects he slept 4.25 hours. He stated he is feeling very anxious because of the lack of sleep. We discussed adding Buspar to his medication regimen. He was also give an additional dose of Vistaril. He continued to be anxious, pacing on the unit and Seroquel 25 mg daily was added to his medication regimen. He stated he has taken Seroquel 25 during the day before and it has helped. He was later observed on the unit and appeared to be much calmer. He has some movements which appear to volitional in nature, grinding teeth, hands on his head, pacing and sighing loudly, and mostly occur when he is being observed. He has been calm and cooperative on the unit. He is attending group therapy. He denies HI/AVH, paranoia and delusions. He endorses intermittent suicidal thought without a plan or intent. He is able to contract for safety on the unit. We will continue to monitor for safety and medication effectiveness.   Principal Problem: MDD (major depressive disorder), recurrent severe, without psychosis  ([place]) Diagnosis: Principal Problem:   MDD (major depressive disorder), recurrent severe, without psychosis ([place]) Active Problems:   Suicidal thoughts   MDD (major depressive disorder), recurrent, severe, with psychosis ([place])   Human immunodeficiency virus infection ([place])  Total Time spent with patient: 20 minutes  Past Psychiatric History: Major depressive disorder without psychosis, GAD, PTSD  Past Medical History:  Past Medical History:  Diagnosis Date   Anxiety    Chronic hepatitis C ([place]) 04/27/2020   Depression    HIV (human immunodeficiency virus infection) ([place])     Past Surgical History:  Procedure Laterality Date   CATARACT EXTRACTION Left    Family History:  Family History  Problem Relation Age of Onset   Mental illness Mother    Bipolar disorder Brother    Family Psychiatric  History: Denies knowing of any family history Social History:  Social History   Substance and Sexual Activity  Alcohol Use No     Social History   Substance and Sexual Activity  Drug Use No    Social History   Socioeconomic History   Marital status: Single    Spouse name: Not on file   Number of children: Not on file   Years of education: Not on file   Highest education level: Not on file  Occupational History   Not on file  Tobacco Use   Smoking status: Never   Smokeless tobacco: Never  Vaping Use   Vaping Use: Never used  Substance and Sexual  Activity   Alcohol use: No   Drug use: No   Sexual activity: Yes    Comment: pt given condoms  Other Topics Concern   Not on file  Social History Narrative   Not on file   Social Determinants of Health   Financial Resource Strain: Not on file  Food Insecurity: Not on file  Transportation Needs: Not on file  Physical Activity: Not on file  Stress: Not on file  Social Connections: Not on file   Additional Social History:    Sleep: Fair  Appetite:  Fair  Current Medications: Current Facility-Administered  Medications  Medication Dose Route Frequency Provider Last Rate Last Admin   acetaminophen (TYLENOL) tablet 650 mg  650 mg Oral Q6H PRN Nwoko, Uchenna E, PA       alum & mag hydroxide-simeth (MAALOX/MYLANTA) 200-200-20 MG/5ML suspension 30 mL  30 mL Oral Q4H PRN Nwoko, Uchenna E, PA       bictegravir-emtricitabine-tenofovir AF (BIKTARVY) 50-200-25 MG per tablet 1 tablet  1 tablet Oral Daily Melbourne Abts W, PA-C   1 tablet at 07/08/21 0809   busPIRone (BUSPAR) tablet 5 mg  5 mg Oral TID Laveda Abbe, NP   5 mg at 07/08/21 1306   gabapentin (NEURONTIN) capsule 100 mg  100 mg Oral TID Jaclyn Shaggy, PA-C   100 mg at 07/08/21 1306   hydrOXYzine (ATARAX/VISTARIL) tablet 25 mg  25 mg Oral TID PRN Karel Jarvis E, PA   25 mg at 07/08/21 0703   magnesium hydroxide (MILK OF MAGNESIA) suspension 30 mL  30 mL Oral Daily PRN Nwoko, Uchenna E, PA   30 mL at 07/07/21 1430   QUEtiapine (SEROQUEL) tablet 100 mg  100 mg Oral QHS Massengill, Harrold Donath, MD       QUEtiapine (SEROQUEL) tablet 25 mg  25 mg Oral Q1200 Laveda Abbe, NP   25 mg at 07/08/21 1306   sertraline (ZOLOFT) tablet 50 mg  50 mg Oral Daily Massengill, Harrold Donath, MD   50 mg at 07/08/21 0809   traZODone (DESYREL) tablet 50 mg  50 mg Oral QHS PRN Meta Hatchet, PA        Lab Results:  Results for orders placed or performed during the hospital encounter of 07/07/21 (from the past 48 hour(s))  Lipid panel     Status: Abnormal   Collection Time: 07/07/21  6:26 AM  Result Value Ref Range   Cholesterol 174 0 - 200 mg/dL   Triglycerides 95 <850 mg/dL   HDL 32 (L) >27 mg/dL   Total CHOL/HDL Ratio 5.4 RATIO   VLDL 19 0 - 40 mg/dL   LDL Cholesterol 741 (H) 0 - 99 mg/dL    Comment:        Total Cholesterol/HDL:CHD Risk Coronary Heart Disease Risk Table                     Men   Women  1/2 Average Risk   3.4   3.3  Average Risk       5.0   4.4  2 X Average Risk   9.6   7.1  3 X Average Risk  23.4   11.0        Use the calculated  Patient Ratio above and the CHD Risk Table to determine the patient's CHD Risk.        ATP III CLASSIFICATION (LDL):  <100     mg/dL   Optimal  287-867  mg/dL  Near or Above                    Optimal  130-159  mg/dL   Borderline  160-189  mg/dL   High  >190     mg/dL   Very High Performed at [facility] [address] [place], [place] [address]   TSH     Status: None   Collection Time: 07/07/21  6:26 AM  Result Value Ref Range   TSH 1.015 0.350 - 4.500 uIU/mL    Comment: Performed by a 3rd Generation assay with a functional sensitivity of <=0.01 uIU/mL. Performed at [facility], [place] [address] [place], [place] [address]   Hemoglobin A1c     Status: None   Collection Time: 07/07/21  6:26 AM  Result Value Ref Range   Hgb A1c MFr Bld 5.2 4.8 - 5.6 %    Comment: (NOTE) Pre diabetes:          5.7%-6.4%  Diabetes:              >6.4%  Glycemic control for   <7.0% adults with diabetes    Mean Plasma Glucose 102.54 mg/dL    Comment: Performed at [facility] [address] [place], [place] [address]  CBC     Status: None   Collection Time: 07/08/21  7:15 AM  Result Value Ref Range   WBC 10.5 4.0 - 10.5 K/uL   RBC 5.36 4.22 - 5.81 MIL/uL   Hemoglobin 16.8 13.0 - 17.0 g/dL   HCT 50.5 39.0 - 52.0 %   MCV 94.2 80.0 - 100.0 fL   MCH 31.3 26.0 - 34.0 pg   MCHC 33.3 30.0 - 36.0 g/dL   RDW 13.6 11.5 - 15.5 %   Platelets 372 150 - 400 K/uL   nRBC 0.0 0.0 - 0.2 %    Comment: Performed at [facility], [place] [address] [place], [place] [address]  Comprehensive metabolic panel     Status: Abnormal   Collection Time: 07/08/21  7:15 AM  Result Value Ref Range   Sodium 140 135 - 145 mmol/L   Potassium 4.1 3.5 - 5.1 mmol/L   Chloride 104 98 - 111 mmol/L   CO2 28 22 - 32 mmol/L   Glucose, Bld 98 70 - 99 mg/dL    Comment: Glucose reference range applies only to samples taken after fasting for at least 8 hours.   BUN  24 (H) 6 - 20 mg/dL   Creatinine, Ser 1.03 0.61 - 1.24 mg/dL   Calcium 9.4 8.9 - 10.3 mg/dL   Total Protein 7.7 6.5 - 8.1 g/dL   Albumin 4.1 3.5 - 5.0 g/dL   AST 14 (L) 15 - 41 U/L   ALT 14 0 - 44 U/L   Alkaline Phosphatase 90 38 - 126 U/L   Total Bilirubin 0.6 0.3 - 1.2 mg/dL   GFR, Estimated >60 >60 mL/min    Comment: (NOTE) Calculated using the CKD-EPI Creatinine Equation (2021)    Anion gap 8 5 - 15    Comment: Performed at [facility], [place] [address], [place], [place] [address]    Blood Alcohol level:  Lab Results  Component Value Date   [facility] <10 07/05/2021   ETH <10 [id number]    Metabolic Disorder Labs: Lab Results  Component Value Date   HGBA1C 5.2 07/07/2021   MPG 102.54 07/07/2021   MPG 96.8 05/06/2021  No results found for: PROLACTIN Lab Results  Component Value Date   CHOL 174 07/07/2021   TRIG 95 07/07/2021   HDL 32 (L) 07/07/2021   CHOLHDL 5.4 07/07/2021   VLDL 19 07/07/2021   LDLCALC 123 (H) 07/07/2021   LDLCALC 90 05/06/2021    Physical Findings: AIMS: Facial and Oral Movements Muscles of Facial Expression: Mild Lips and Perioral Area: Moderate Jaw: Minimal Tongue: Minimal,Extremity Movements Upper (arms, wrists, hands, fingers): None, normal Lower (legs, knees, ankles, toes): Mild, Trunk Movements Neck, shoulders, hips: None, normal, Overall Severity Severity of abnormal movements (highest score from questions above): Moderate Incapacitation due to abnormal movements: None, normal Patient's awareness of abnormal movements (rate only patient's report): Aware, no distress, Dental Status Current problems with teeth and/or dentures?: No Does patient usually wear dentures?: No  CIWA:  CIWA-Ar Total: 0 COWS:     Musculoskeletal: Strength & Muscle Tone: within normal limits Gait & Station: normal Patient leans: N/A  Psychiatric Specialty Exam:  Presentation  General Appearance: Disheveled; Appropriate for Environment  (Any information, that required communication, was gathered by a sign language interpreter)  Eye Contact:Good  Speech:Clear and Coherent (Patient is deaf, needs ASL  interpreter)  Speech Volume:Other (comment) (patient is deaf and does not speak)  Handedness:Right   Mood and Affect  Mood:Depressed; Anxious  Affect:Constricted   Thought Process  Thought Processes:Linear; Coherent  Descriptions of Associations:Intact  Orientation:Full (Time, Place and Person)  Thought Content:Logical  History of Schizophrenia/Schizoaffective disorder:No  Duration of Psychotic Symptoms:N/A (patient denies delusions, hallucinations and paranoia)  Hallucinations:Hallucinations: None  Ideas of Reference:None  Suicidal Thoughts:Suicidal Thoughts: Yes, Passive SI Passive Intent and/or Plan: Without Intent; Without Plan  Homicidal Thoughts:Homicidal Thoughts: No   Sensorium  Memory:Immediate Fair; Recent Fair; Remote Fair  Judgment:Poor  Insight:Poor   Executive Functions  Concentration:Poor  Attention Span:Poor  [place] (Sign language via interpreter)   Psychomotor Activity  Psychomotor Activity:Psychomotor Activity: Normal   Assets  Assets:Communication Skills; Desire for Improvement; Housing; Catering manager; Social Support; Vocational/Educational; Resilience   Sleep  Sleep:Sleep: Fair Number of Hours of Sleep: 4.25    Physical Exam: Physical Exam Constitutional:      Appearance: Normal appearance.  HENT:     Head: Normocephalic and atraumatic.  Musculoskeletal:        General: Normal range of motion.     Cervical back: Normal range of motion.  Neurological:     General: No focal deficit present.     Mental Status: He is alert and oriented to person, place, and time.  Psychiatric:        Attention and Perception: Attention and perception normal.        Mood and Affect: Mood is anxious.         Speech: Speech normal.        Behavior: Behavior normal. Behavior is cooperative.        Thought Content: Thought content is not paranoid or delusional. Thought content includes suicidal (no plan or intent) ideation. Thought content does not include homicidal ideation. Thought content does not include homicidal or suicidal plan.        Cognition and Memory: Cognition normal.     Comments: Patient is deaf, needs ASL interpreter   Review of Systems  Constitutional: Negative.   HENT: Negative.  Negative for congestion, sinus pain and sore throat.   Respiratory:  Negative for cough and shortness of breath.   Cardiovascular: Negative.  Negative for chest pain.  Genitourinary: Negative.   Musculoskeletal: Negative.   Neurological: Negative.    Blood pressure 107/78, pulse (!) 115, temperature 98 F (36.7 C), temperature source Oral, resp. rate 18, height 6\' 4"  (1.93 m), weight 88.5 kg, SpO2 100 %. Body mass index is 23.74 kg/m.   Treatment Plan Summary: Daily contact with patient to assess and evaluate symptoms and progress in treatment and Medication management  MDD, recurrent severe without psychosis:  Discontinue Lexapro  Continue Zoloft 50 mg PO daily, first dose today Discontinue Zyprexa Continue Seroquel 100 mg PO at bedtime and titrate for effectiveness Start Seroquel 25 mg PO daily for mood/anxiety   Anxiety:  Gabapentin 100 mg PO TID for anxiety Start Buspar 5 mg PO TID for anxiety Vistaril 25 mg PO TID PRN for anxiety   Insomnia:  Trazodone 50 mg PO at bedtime PRN Seroquel 100 mg PO at bedtime    Medical problems addressed during this hospitalization: HIV Restart Biktarvy 50-200-25 mg PO daily   Other PRN medications:  Maalox 30 mL PO every 4 hours for indigestion Milk of magnesia 30 mL PO  Tylenol 650 MG PO every 6 hours as needed for mild pain    Ethelene Hal, NP 07/08/2021, 3:12 PM

## 2021-07-08 NOTE — BH IP Treatment Plan (Signed)
Interdisciplinary Treatment and Diagnostic Plan Update  07/08/2021 Time of Session: 9:05am  Tyler Horn MRN: [id number]  Principal Diagnosis: MDD (major depressive disorder), recurrent, severe, with psychosis ([place])  Secondary Diagnoses: Principal Problem:   MDD (major depressive disorder), recurrent, severe, with psychosis ([place]) Active Problems:   Suicidal thoughts   Human immunodeficiency virus infection ([place])   Current Medications:  Current Facility-Administered Medications  Medication Dose Route Frequency Provider Last Rate Last Admin   acetaminophen (TYLENOL) tablet 650 mg  650 mg Oral Q6H PRN Nwoko, Uchenna E, PA       alum & mag hydroxide-simeth (MAALOX/MYLANTA) 200-200-20 MG/5ML suspension 30 mL  30 mL Oral Q4H PRN Nwoko, Uchenna E, PA       bictegravir-emtricitabine-tenofovir AF (BIKTARVY) 50-200-25 MG per tablet 1 tablet  1 tablet Oral Daily Margorie John W, PA-C   1 tablet at 07/08/21 0809   busPIRone (BUSPAR) tablet 5 mg  5 mg Oral TID Ethelene Hal, NP   5 mg at 07/08/21 1306   gabapentin (NEURONTIN) capsule 100 mg  100 mg Oral TID Prescilla Sours, PA-C   100 mg at 07/08/21 1306   hydrOXYzine (ATARAX/VISTARIL) tablet 25 mg  25 mg Oral TID PRN Nwoko, Uchenna E, PA   25 mg at 07/08/21 0703   magnesium hydroxide (MILK OF MAGNESIA) suspension 30 mL  30 mL Oral Daily PRN Nwoko, Uchenna E, PA   30 mL at 07/07/21 1430   QUEtiapine (SEROQUEL) tablet 100 mg  100 mg Oral QHS Massengill, Nathan, MD       QUEtiapine (SEROQUEL) tablet 25 mg  25 mg Oral Q1200 Ethelene Hal, NP   25 mg at 07/08/21 1306   sertraline (ZOLOFT) tablet 50 mg  50 mg Oral Daily Massengill, Ovid Curd, MD   50 mg at 07/08/21 0809   traZODone (DESYREL) tablet 50 mg  50 mg Oral QHS PRN Nwoko, Uchenna E, PA       PTA Medications: Medications Prior to Admission  Medication Sig Dispense Refill Last Dose   bictegravir-emtricitabine-tenofovir AF (BIKTARVY) 50-200-25 MG TABS tablet Take 1 tablet by mouth  daily. 30 tablet 5    Cholecalciferol (VITAMIN D3 PO) Take 4 capsules by mouth every morning.      escitalopram (LEXAPRO) 20 MG tablet Take 1 tablet (20 mg total) by mouth daily. Patient to take 1 tablet (20 mg total) for 6 days followed by 2 tablets (40 mg total) daily (Patient taking differently: Take 40 mg by mouth every morning.) 30 tablet 1    gabapentin (NEURONTIN) 100 MG capsule Take 1 capsule (100 mg total) by mouth 3 (three) times daily. (Patient not taking: Reported on 07/05/2021) 3 capsule 0    LORazepam (ATIVAN) 1 MG tablet Take 1 tablet (1 mg total) by mouth 3 (three) times daily as needed for anxiety. (Patient not taking: No sig reported) 3 tablet 0    naproxen sodium (ALEVE) 220 MG tablet Take 220 mg by mouth 2 (two) times daily as needed (headache).      OLANZapine (ZYPREXA) 10 MG tablet Take 1 tablet (10 mg total) by mouth at bedtime. 30 tablet 1    Omega-3 Fatty Acids (FISH OIL PO) Take 1 capsule by mouth every morning.      OVER THE COUNTER MEDICATION Take 25-50 mg by mouth at bedtime as needed (sleep). Unisom - doxylamine or diphenhydramine      Pseudoeph-Doxylamine-DM-APAP (NYQUIL PO) Take 2 tablets by mouth at bedtime.      tamsulosin (FLOMAX) 0.4  MG CAPS capsule Take 0.4 mg by mouth daily. (Patient not taking: No sig reported)       Patient Stressors: Health problems   Medication change or noncompliance    Patient Strengths: Ability for insight  Average or above average intelligence  Communication skills  Motivation for treatment/growth   Treatment Modalities: Medication Management, Group therapy, Case management,  1 to 1 session with clinician, Psychoeducation, Recreational therapy.   Physician Treatment Plan for Primary Diagnosis: MDD (major depressive disorder), recurrent, severe, with psychosis ([place]) Long Term Goal(s): Improvement in symptoms so as ready for discharge   Short Term Goals: Ability to identify changes in lifestyle to reduce recurrence of condition  will improve Ability to verbalize feelings will improve Ability to disclose and discuss suicidal ideas Ability to demonstrate self-control will improve Ability to maintain clinical measurements within normal limits will improve Ability to identify triggers associated with substance abuse/mental health issues will improve Ability to identify and develop effective coping behaviors will improve Compliance with prescribed medications will improve  Medication Management: Evaluate patient's response, side effects, and tolerance of medication regimen.  Therapeutic Interventions: 1 to 1 sessions, Unit Group sessions and Medication administration.  Evaluation of Outcomes: Not Met  Physician Treatment Plan for Secondary Diagnosis: Principal Problem:   MDD (major depressive disorder), recurrent, severe, with psychosis ([place]) Active Problems:   Suicidal thoughts   Human immunodeficiency virus infection ([place])  Long Term Goal(s): Improvement in symptoms so as ready for discharge   Short Term Goals: Ability to identify changes in lifestyle to reduce recurrence of condition will improve Ability to verbalize feelings will improve Ability to disclose and discuss suicidal ideas Ability to demonstrate self-control will improve Ability to maintain clinical measurements within normal limits will improve Ability to identify triggers associated with substance abuse/mental health issues will improve Ability to identify and develop effective coping behaviors will improve Compliance with prescribed medications will improve     Medication Management: Evaluate patient's response, side effects, and tolerance of medication regimen.  Therapeutic Interventions: 1 to 1 sessions, Unit Group sessions and Medication administration.  Evaluation of Outcomes: Not Met   RN Treatment Plan for Primary Diagnosis: MDD (major depressive disorder), recurrent, severe, with psychosis ([place]) Long Term Goal(s): Knowledge of  disease and therapeutic regimen to maintain health will improve  Short Term Goals: Ability to remain free from injury will improve, Ability to participate in decision making will improve, Ability to verbalize feelings will improve, Ability to disclose and discuss suicidal ideas, and Ability to identify and develop effective coping behaviors will improve  Medication Management: RN will administer medications as ordered by provider, will assess and evaluate patient's response and provide education to patient for prescribed medication. RN will report any adverse and/or side effects to prescribing provider.  Therapeutic Interventions: 1 on 1 counseling sessions, Psychoeducation, Medication administration, Evaluate responses to treatment, Monitor vital signs and CBGs as ordered, Perform/monitor CIWA, COWS, AIMS and Fall Risk screenings as ordered, Perform wound care treatments as ordered.  Evaluation of Outcomes: Not Met   LCSW Treatment Plan for Primary Diagnosis: MDD (major depressive disorder), recurrent, severe, with psychosis ([place]) Long Term Goal(s): Safe transition to appropriate next level of care at discharge, Engage patient in therapeutic group addressing interpersonal concerns.  Short Term Goals: Engage patient in aftercare planning with referrals and resources, Increase social support, Increase emotional regulation, Facilitate acceptance of mental health diagnosis and concerns, Identify triggers associated with mental health/substance abuse issues, and Increase skills for wellness and recovery  Therapeutic Interventions: Assess for all discharge needs, 1 to 1 time with Education officer, museum, Explore available resources and support systems, Assess for adequacy in community support network, Educate family and significant other(s) on suicide prevention, Complete Psychosocial Assessment, Interpersonal group therapy.  Evaluation of Outcomes: Not Met   Progress in Treatment: Attending groups:  No. Participating in groups: No. Taking medication as prescribed: Yes. Toleration medication: Yes. Family/Significant other contact made: Yes, individual(s) contacted:  Mother  Patient understands diagnosis: Yes. Discussing patient identified problems/goals with staff: Yes. Medical problems stabilized or resolved: Yes. Denies suicidal/homicidal ideation: Yes. Issues/concerns per patient self-inventory: No.   New problem(s) identified: No, Describe:  None   New Short Term/Long Term Goal(s): medication stabilization, elimination of SI thoughts, development of comprehensive mental wellness plan.   Patient Goals: "To be stable and to get on the right medications"   Discharge Plan or Barriers: Patient recently admitted. CSW will continue to follow and assess for appropriate referrals and possible discharge planning.   Reason for Continuation of Hospitalization: Depression Medication stabilization Suicidal ideation  Estimated Length of Stay: 3 to 5 days    Scribe for Treatment Team: Darleen Crocker, Latanya Presser 07/08/2021 1:48 PM

## 2021-07-08 NOTE — Group Note (Unsigned)
Group Topic: Other  Group Date: 07/08/2021 Start Time: 1600 End Time: 1700 Facilitators: Guss Bunde  [facility]  Number of Participants: 9  Group Focus: personal responsibility Treatment Modality:  Psychoeducation Interventions utilized were other  Purpose: Encourage patients to accept personal responsiblity   Name: Tyler Horn Date of Birth: August 10, 1961  MR: [id number]    Level of Participation: {THERAPIES; PSYCH GROUP PARTICIPATION YBOFB:51025} Quality of Participation: {THERAPIES; PSYCH QUALITY OF PARTICIPATION:23992} Interactions with others: {THERAPIES; PSYCH INTERACTIONS:23993} Mood/Affect: {THERAPIES; PSYCH MOOD/AFFECT:23994} Triggers (if applicable): *** Cognition: {THERAPIES; PSYCH COGNITION:23995} Progress: {THERAPIES; PSYCH PROGRESS:23997} Response: *** Plan: {THERAPIES; PSYCH ENID:78242}  Patients Problems:  Patient Active Problem List   Diagnosis Date Noted   MDD (major depressive disorder) 05/05/2021   Insomnia due to other mental disorder 02/19/2021   Panic disorder 01/07/2021   Severe episode of recurrent major depressive disorder, with psychotic features (HCC) 10/16/2020   Vision changes 07/09/2020   Weight loss 07/09/2020   Idiopathic peripheral neuropathy 04/27/2020   Herpes simplex 04/27/2020   Herpes zoster 04/27/2020   Healthcare maintenance 12/19/2019   Early syphilis, 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   Post-traumatic stress disorder, unspecified 11/28/2019   Sedative, 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

## 2021-07-08 NOTE — Progress Notes (Signed)
Recreation Therapy Notes  Date: 11.9.22 Time: 0930-1000 Location: 300 Hall Dayroom  Group Topic: Stress Management   Goal Area(s) Addresses:  Patient will actively participate in stress management techniques presented during session.  Patient will successfully identify benefit of practicing stress management post d/c.   Intervention: Relaxation exercise with ambient sound and script   Activity: Guided Imagery. LRT provided education, instruction, and demonstration on practice of visualization via guided imagery. Patient was asked to participate in the technique introduced during session. LRT debriefed including topics of mindfulness, stress management and specific scenarios each patient could use these techniques. Patients were given suggestions of ways to access scripts post d/c and encouraged to explore Youtube and other apps available on smartphones, tablets, and computers.  Education:  Stress Management, Discharge Planning.   Clinical Observations/Feedback: Group did not occur due to orientation group going over 20 minutes.  Packets were given with information on ways to manage stress, techniques that can be used, proper breathing techniques and how to properly do the techniques given such as progressive muscle relaxation, diaphragmatic breathing and mindfulness meditation.     Caroll Rancher, LRT, CTRS        Lillia Abed, Revia Nghiem A 07/08/2021 11:55 AM

## 2021-07-08 NOTE — Progress Notes (Signed)
Pt denies HI/AVH but does endorse SI with no plan. Pt verbally agrees to approach staff if these become apparent or before harming themselves/others. Rates depression 9/10. Rates anxiety 9/10. Rates pain 0/10. Pt will become more anxious around nurses. Pt will walk back and front in front of the medication window when RN is doing med pass. Pt has been asking for ativan but will not be getting any. Pt will smack lips and exaggerate jaw movement, more so around staff. NP notified and agrees. Pt has been seen pacing the hallways. Scheduled medications administered to Pt, per MD orders. RN provided support and encouragement to Pt. Q15 min safety checks implemented and continued. Pt safe on the unit. RN will continue to monitor and intervene as needed.   07/08/21 0809  Psych Admission Type (Psych Patients Only)  Admission Status Voluntary  Psychosocial Assessment  Patient Complaints Self-harm thoughts;Anxiety;Depression  Eye Contact Fair  Facial Expression Anxious;Flat  Affect Flat;Anxious  Speech Other (Comment) (deaf)  Interaction Assertive;Needy  Motor Activity Hyperactive;Fidgety  Appearance/Hygiene Unremarkable  Behavior Characteristics Cooperative;Anxious;Fidgety  Mood Anxious;Depressed  Thought Process  Coherency WDL  Content WDL  Delusions None reported or observed  Perception WDL  Hallucination None reported or observed  Judgment Poor  Confusion None  Danger to Self  Current suicidal ideation? Passive  Self-Injurious Behavior No self-injurious ideation or behavior indicators observed or expressed   Agreement Not to Harm Self Yes  Description of Agreement verbal contract  Danger to Others  Danger to Others None reported or observed

## 2021-07-08 NOTE — Progress Notes (Signed)
Pt did not attend group. 

## 2021-07-08 NOTE — Progress Notes (Addendum)
   07/08/21 0920  Vital Signs  Temp 98 F (36.7 C)  Temp Source Oral  Pulse Rate (!) 118  BP 103/74  BP Method Automatic  Patient Position (if appropriate) Standing   Pt. Stating that he is feeling "dizzy" and his eyesight is "foggy" Pt. Reported that the dizziness and fogginess improves when his anxiety decreases. Pt. Rated both  anxiety 10/10 and depression.Pt. was lying in bed with the lights off. Interpreter was available in the hall. Dr. Truitt Merle.

## 2021-07-08 NOTE — Group Note (Signed)
LCSW Group Therapy Note   Group Date: 07/08/2021 Start Time: 1300 End Time: 1400   Type of Therapy and Topic:  Group Therapy:   Participation Level:  Did Not Attend  Description of Group:Group encouraged increased engagement and participation through discussion focused on Safety Planning. Patients worked both individually and collaboratively to create and discuss the different elements of a safety plan, including identifying warning signs, coping skills, professional supports, people you can ask for help, how to make the environment safe, and reasons for life worth living. Remainder of group was spent filling out individual safety plans to be placed in patient charts.   Therapeutic Goal(s): Identify warning signs and triggers Identify positive coping strategies Identify professional and personal supports when experiencing a mental health crisis Identify ways in which you can make the environment safe Identify reasons for life worth living Identify the steps to completing a safety plan and provide education on completing a safety plan at discharge   Summary of Patient Progress:  Did Not Attend   Felizardo Hoffmann, Theresia Majors 07/08/2021  1:25 PM

## 2021-07-09 DIAGNOSIS — F333 Major depressive disorder, recurrent, severe with psychotic symptoms: Secondary | ICD-10-CM | POA: Diagnosis not present

## 2021-07-09 MED ORDER — GABAPENTIN 100 MG PO CAPS
100.0000 mg | ORAL_CAPSULE | Freq: Once | ORAL | Status: AC
  Administered 2021-07-09: 100 mg via ORAL
  Filled 2021-07-09: qty 1

## 2021-07-09 MED ORDER — QUETIAPINE FUMARATE ER 50 MG PO TB24
50.0000 mg | ORAL_TABLET | Freq: Every day | ORAL | Status: DC
  Administered 2021-07-10 – 2021-07-14 (×5): 50 mg via ORAL
  Filled 2021-07-09 (×7): qty 1

## 2021-07-09 MED ORDER — PROPRANOLOL HCL 10 MG PO TABS
5.0000 mg | ORAL_TABLET | Freq: Every day | ORAL | Status: DC
  Administered 2021-07-09 – 2021-07-14 (×6): 5 mg via ORAL
  Filled 2021-07-09 (×2): qty 0.5
  Filled 2021-07-09: qty 1
  Filled 2021-07-09 (×6): qty 0.5

## 2021-07-09 MED ORDER — QUETIAPINE FUMARATE 200 MG PO TABS
200.0000 mg | ORAL_TABLET | Freq: Every day | ORAL | Status: DC
  Administered 2021-07-09 – 2021-07-13 (×5): 200 mg via ORAL
  Filled 2021-07-09 (×7): qty 1

## 2021-07-09 MED ORDER — TAMSULOSIN HCL 0.4 MG PO CAPS
0.4000 mg | ORAL_CAPSULE | Freq: Every day | ORAL | Status: DC
  Administered 2021-07-09 – 2021-07-14 (×6): 0.4 mg via ORAL
  Filled 2021-07-09 (×9): qty 1

## 2021-07-09 MED ORDER — QUETIAPINE FUMARATE 25 MG PO TABS
25.0000 mg | ORAL_TABLET | Freq: Once | ORAL | Status: DC
  Filled 2021-07-09: qty 1

## 2021-07-09 MED ORDER — LOPERAMIDE HCL 2 MG PO CAPS
4.0000 mg | ORAL_CAPSULE | Freq: Once | ORAL | Status: AC
Start: 2021-07-10 — End: 2021-07-10
  Administered 2021-07-10: 4 mg via ORAL
  Filled 2021-07-09 (×2): qty 2

## 2021-07-09 MED ORDER — GABAPENTIN 100 MG PO CAPS
200.0000 mg | ORAL_CAPSULE | Freq: Three times a day (TID) | ORAL | Status: DC
  Administered 2021-07-09 – 2021-07-14 (×15): 200 mg via ORAL
  Filled 2021-07-09 (×21): qty 2

## 2021-07-09 MED ORDER — QUETIAPINE FUMARATE 50 MG PO TABS
50.0000 mg | ORAL_TABLET | Freq: Every day | ORAL | Status: DC
  Administered 2021-07-09: 25 mg via ORAL
  Filled 2021-07-09 (×2): qty 1

## 2021-07-09 NOTE — Plan of Care (Signed)
Nurse discussed anxiety, depression and coping skills with patient.  

## 2021-07-09 NOTE — BHH Group Notes (Signed)
Relaxation and Psycho-ed Group were combined. Healthy coping skills were discussed as it attributes to promoting mental well being. Discussed with patients that mental health journey is like a pie, and that healthy coping skills add value to mental well being. Identified unhealthy coping skills and how they play negative impact, and identified positive healthy coping skills that are free, accessible and easily implemented. Closed group with deep breathing practice and education regarding stress reduction.  Pt did not attend.  

## 2021-07-09 NOTE — Progress Notes (Signed)
[facility] MD Progress Note  07/09/2021 3:54 PM Kyian Teigen  MRN:  [id number]  Subjective: (This assessment was conducted using a sign language interpreter via online): Santee reports, "I'm not feeling very well. I have had a lot of medication changes. Prior to coming to the [facility], I went to the Behavioral health center. I told them that something is wrong with my medication. They said they could not help me because the appointment was 3 months out. That is how I ended up here. I'm not still not feeling good. I still have thoughts of hurting myself. I'm still hearing voices. I have a lot anxiety & brain fog. I'm unable to remember much. I have lost 40 pounds in 3 weeks. I have had a lot of trauma in my life. No body loves him, even here". Daily notes: Lucious is seen, chart reviewed. The chart findings & treatment plan discussed with the treatment team. He is seen with an online interpreter Lawson Fiscal) during this follow-up care evaluation. Brett Canales presents alert, oriented & aware of situation. He is visible on the unit. He presents with a flat affect. He continue to present with worsening symptoms of depression & anxiety. He is endorsing auditory hallucinations. He is reporting restlessness & feeling of hopelessness. He does not think that his current medications are helping him. We discussed medication adjustment & with his consent we increased Seroquel to 200 mg Qhs, the day time Seroquel was increased to 50 mg & gabapentin increased to 200 mg tid. We initiated Propranolol 5 mg po daily for elevated heart rate. Patient is encouraged to take his medications, allow them to get in his system to see the positive results eventually. Will continue current plan of care as already in progress.  Per admission evaluation notes: Kern Aho is a 60 year old male who is deaf and lives alone in an apartment. 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 He has outpatient medication management services with [name], PA at [facility] and therapy  with Mel Almond at [facility] [place]. His last medication management appointment was on 10/11, his next appointment is on 12/6. Patient is known to this facility and was recently admitted in September for the same or similar presentation. Patient stated he took 100 OTC sleeping pills and drank 2 bottles of Nyquil in an effort to end his life.   Principal Problem: MDD (major depressive disorder), recurrent, severe, with psychosis (HCC)  Diagnosis: Principal Problem:   MDD (major depressive disorder), recurrent, severe, with psychosis (HCC) Active Problems:   Suicidal thoughts   Human immunodeficiency virus infection (HCC)   MDD (major depressive disorder), recurrent severe, without psychosis (HCC)  Total Time spent with patient:  35 minutes  Past Psychiatric History: See H&P  Past Medical History:  Past Medical History:  Diagnosis Date   Anxiety    Chronic hepatitis C (HCC) 04/27/2020   Depression    HIV (human immunodeficiency virus infection) (HCC)     Past Surgical History:  Procedure Laterality Date   CATARACT EXTRACTION Left    Family History:  Family History  Problem Relation Age of Onset   Mental illness Mother    Bipolar disorder Brother    Family Psychiatric  History: See H&P  Social History:  Social History   Substance and Sexual Activity  Alcohol Use No     Social History   Substance and Sexual Activity  Drug Use No    Social History   Socioeconomic History   Marital status: Single  Spouse name: Not on file   Number of children: Not on file   Years of education: Not on file   Highest education level: Not on file  Occupational History   Not on file  Tobacco Use   Smoking status: Never   Smokeless tobacco: Never  Vaping Use   Vaping Use: Never used  Substance and Sexual Activity   Alcohol use: No   Drug use: No   Sexual activity: Yes    Comment: pt given condoms  Other Topics Concern   Not on file  Social History Narrative    Not on file   Social Determinants of Health   Financial Resource Strain: Not on file  Food Insecurity: Not on file  Transportation Needs: Not on file  Physical Activity: Not on file  Stress: Not on file  Social Connections: Not on file   Additional Social History:   Sleep: Good, 6.75 oer documentation.  Appetite:  Good  Current Medications: Current Facility-Administered Medications  Medication Dose Route Frequency Provider Last Rate Last Admin   acetaminophen (TYLENOL) tablet 650 mg  650 mg Oral Q6H PRN Dardan Shelton, Uchenna E, PA       alum & mag hydroxide-simeth (MAALOX/MYLANTA) 200-200-20 MG/5ML suspension 30 mL  30 mL Oral Q4H PRN Vimal Derego, Uchenna E, PA       bictegravir-emtricitabine-tenofovir AF (BIKTARVY) 50-200-25 MG per tablet 1 tablet  1 tablet Oral Daily Melbourne Abts W, PA-C   1 tablet at 07/09/21 6063   busPIRone (BUSPAR) tablet 5 mg  5 mg Oral TID Laveda Abbe, NP   5 mg at 07/09/21 1209   gabapentin (NEURONTIN) capsule 200 mg  200 mg Oral TID Armandina Stammer I, NP   200 mg at 07/09/21 1401   hydrOXYzine (ATARAX/VISTARIL) tablet 25 mg  25 mg Oral TID PRN Karel Jarvis E, PA   25 mg at 07/08/21 2147   magnesium hydroxide (MILK OF MAGNESIA) suspension 30 mL  30 mL Oral Daily PRN Akon Reinoso, Uchenna E, PA   30 mL at 07/07/21 1430   propranolol (INDERAL) tablet 5 mg  5 mg Oral Daily Armandina Stammer I, NP   5 mg at 07/09/21 1542   QUEtiapine (SEROQUEL) tablet 200 mg  200 mg Oral QHS Armandina Stammer I, NP       QUEtiapine (SEROQUEL) tablet 25 mg  25 mg Oral Once Armandina Stammer I, NP       QUEtiapine (SEROQUEL) tablet 50 mg  50 mg Oral Q1200 Armandina Stammer I, NP   25 mg at 07/09/21 1209   sertraline (ZOLOFT) tablet 50 mg  50 mg Oral Daily Massengill, Harrold Donath, MD   50 mg at 07/09/21 0831   traZODone (DESYREL) tablet 50 mg  50 mg Oral QHS PRN Karel Jarvis E, PA   50 mg at 07/08/21 2146    Lab Results:  Results for orders placed or performed during the hospital encounter of 07/07/21 (from  the past 48 hour(s))  CBC     Status: None   Collection Time: 07/08/21  7:15 AM  Result Value Ref Range   WBC 10.5 4.0 - 10.5 K/uL   RBC 5.36 4.22 - 5.81 MIL/uL   Hemoglobin 16.8 13.0 - 17.0 g/dL   HCT 01.6 01.0 - 93.2 %   MCV 94.2 80.0 - 100.0 fL   MCH 31.3 26.0 - 34.0 pg   MCHC 33.3 30.0 - 36.0 g/dL   RDW 35.5 73.2 - 20.2 %   Platelets 372 150 - 400 K/uL  nRBC 0.0 0.0 - 0.2 %    Comment: Performed at [facility], [address], [place], [place] [address]  Comprehensive metabolic panel     Status: Abnormal   Collection Time: 07/08/21  7:15 AM  Result Value Ref Range   Sodium 140 135 - 145 mmol/L   Potassium 4.1 3.5 - 5.1 mmol/L   Chloride 104 98 - 111 mmol/L   CO2 28 22 - 32 mmol/L   Glucose, Bld 98 70 - 99 mg/dL    Comment: Glucose reference range applies only to samples taken after fasting for at least 8 hours.   BUN 24 (H) 6 - 20 mg/dL   Creatinine, Ser 5.28 0.61 - 1.24 mg/dL   Calcium 9.4 8.9 - 41.3 mg/dL   Total Protein 7.7 6.5 - 8.1 g/dL   Albumin 4.1 3.5 - 5.0 g/dL   AST 14 (L) 15 - 41 U/L   ALT 14 0 - 44 U/L   Alkaline Phosphatase 90 38 - 126 U/L   Total Bilirubin 0.6 0.3 - 1.2 mg/dL   GFR, Estimated >24 >40 mL/min    Comment: (NOTE) Calculated using the CKD-EPI Creatinine Equation (2021)    Anion gap 8 5 - 15    Comment: Performed at [facility], [address] [place], [place] [address]    Blood Alcohol level:  Lab Results  Component Value Date   [facility] <10 07/05/2021   ETH <10 07/01/2021   Metabolic Disorder Labs: Lab Results  Component Value Date   HGBA1C 5.2 07/07/2021   MPG 102.54 07/07/2021   MPG 96.8 05/06/2021   No results found for: PROLACTIN Lab Results  Component Value Date   CHOL 174 07/07/2021   TRIG 95 07/07/2021   HDL 32 (L) 07/07/2021   CHOLHDL 5.4 07/07/2021   VLDL 19 07/07/2021   LDLCALC 123 (H) 07/07/2021   LDLCALC 90 05/06/2021   Physical Findings: AIMS: Facial and Oral  Movements Muscles of Facial Expression: Mild Lips and Perioral Area: Moderate Jaw: Minimal Tongue: Minimal,Extremity Movements Upper (arms, wrists, hands, fingers): None, normal Lower (legs, knees, ankles, toes): Mild, Trunk Movements Neck, shoulders, hips: None, normal, Overall Severity Severity of abnormal movements (highest score from questions above): Moderate Incapacitation due to abnormal movements: None, normal Patient's awareness of abnormal movements (rate only patient's report): Aware, no distress, Dental Status Current problems with teeth and/or dentures?: No Does patient usually wear dentures?: No  CIWA:  CIWA-Ar Total: 0 COWS:     Musculoskeletal: Strength & Muscle Tone: within normal limits Gait & Station: normal Patient leans: N/A  Psychiatric Specialty Exam:  Presentation  General Appearance: Disheveled  Eye Contact:Fair  Speech:-- (Per sign language interpretere via online sign language.)  Speech Volume:Normal  Handedness:Right  Mood and Affect  Mood:Anxious; Depressed; Dysphoric; Worthless  Affect:Depressed; Flat; Congruent  Thought Process  Thought Processes:Goal Directed  Descriptions of Associations:Circumstantial  Orientation:Full (Time, Place and Person)  Thought Content:Rumination  History of Schizophrenia/Schizoaffective disorder:No  Duration of Psychotic Symptoms:N/A  Hallucinations:Hallucinations: Auditory Description of Auditory Hallucinations: Did not express what the voices were saying. Description of Visual Hallucinations: NA  Ideas of Reference:None  Suicidal Thoughts:Suicidal Thoughts: Yes, Passive SI Passive Intent and/or Plan: Without Intent; Without Plan; Without Means to Carry Out; Without Access to Means  Homicidal Thoughts:Homicidal Thoughts: No  Sensorium  Memory:Immediate Good  Judgment:Impaired  Insight:Fair  Executive Functions  Concentration:Fair  Attention Span:Fair  Recall:Good  Fund of  Knowledge:Fair  Language:Other (comment) (Sign language)  Psychomotor  Activity  Psychomotor Activity:Psychomotor Activity: Increased  Assets  Assets:Desire for Improvement; Housing; Financial Resources/Insurance; Resilience  Sleep  Sleep:Sleep: Good Number of Hours of Sleep: 6.75  Physical Exam: Physical Exam Vitals and nursing note reviewed.  HENT:     Head: Normocephalic.     Ears:     Comments: Deafness    Nose: Nose normal.  Cardiovascular:     Rate and Rhythm: Normal rate.     Pulses: Normal pulses.  Pulmonary:     Effort: Pulmonary effort is normal.  Genitourinary:    Comments: Hx. BPH Musculoskeletal:        General: Normal range of motion.     Cervical back: Normal range of motion.  Skin:    General: Skin is warm and dry.  Neurological:     General: No focal deficit present.     Mental Status: He is alert and oriented to person, place, and time.   Review of Systems  Constitutional:  Negative for chills, diaphoresis and fever.  HENT:  Positive for hearing loss. Negative for congestion and sore throat.   Eyes:  Negative for blurred vision.  Respiratory:  Negative for cough, shortness of breath and wheezing.   Cardiovascular:  Negative for chest pain and palpitations.  Gastrointestinal:  Negative for abdominal pain, blood in stool, constipation, diarrhea, heartburn, melena, nausea and vomiting.  Genitourinary:  Negative for dysuria.  Musculoskeletal:  Negative for joint pain and myalgias.  Skin: Negative.   Neurological:  Negative for dizziness, tingling, tremors, sensory change, speech change, focal weakness, seizures, loss of consciousness, weakness and headaches.  Endo/Heme/Allergies:  Negative for environmental allergies and polydipsia. Does not bruise/bleed easily.       Allergies: NKDA  Psychiatric/Behavioral:  Positive for depression, hallucinations (Hx. VH) and suicidal ideas. Negative for memory loss and substance abuse. The patient is nervous/anxious.  The patient does not have insomnia.   Blood pressure 115/65, pulse (!) 109, temperature 97.7 F (36.5 C), temperature source Oral, resp. rate 16, height 6\' 4"  (1.93 m), weight 88.5 kg, SpO2 96 %. Body mass index is 23.74 kg/m.  Treatment Plan Summary: Daily contact with patient to assess and evaluate symptoms and progress in treatment and Medication management  Continue inpatient hospitalization.  Will continue today 07/09/2021 plan as below except where it is noted.   Diagnoses:  Major depressive disorder, recurrent episodes, severe with psychosis. HIV.  Anxiety. Continue Vistaril 25 mg po tid prn. Increased gabapentin from 100 mg to gabapentin 200 mg po tid. Initiated Propranolol 0.5 mg po daily for anxiety/elevated heart rate. Continue Buspar 5 mg po tid. . Depression with psychosis. Continue Sertraline 50 mg po daily. Increased Seroquel from 100 to 200 mg po Q hs. Increased daily time Seroquel from 25 mg to 50 mg po daily for agitation.                   Insomnia. Continue Trazodone 50 mg po Q hs prn.   Other medical issues: continue. Biktarvy 50-200-25 mg po daily for HIV. Flomax 0.4 mg po daily for BPH.  Mylanta 30 ml po Q 4 hrs prn for indigestion. MOM 30 ml po daily prn for constipation. Acetaminophen 650 mg po Q 4 hrs prn for pain/fever.  Encourage group participation.  Discharge disposition plan is ongoing.             Armandina Stammer, NP, pmhnp, fnp-bc. 07/09/2021, 3:54 PM Patient ID: [name], male   DOB: 04-05-1961, 61 y.o.   MRN:  [id number]

## 2021-07-09 NOTE — Progress Notes (Signed)
   07/09/21 2118  Psych Admission Type (Psych Patients Only)  Admission Status Voluntary  Psychosocial Assessment  Patient Complaints Anxiety;Depression  Eye Contact Fair  Facial Expression Anxious  Affect Anxious;Appropriate to circumstance  Speech Other (Comment) (Patient does not speak he is hard of hearing)  Interaction Assertive;Needy  Motor Activity Fidgety  Appearance/Hygiene Unremarkable  Behavior Characteristics Cooperative;Appropriate to situation  Mood Anxious  Thought Process  Coherency WDL  Content WDL  Delusions None reported or observed  Perception WDL  Hallucination None reported or observed  Judgment Poor  Confusion None  Danger to Self  Current suicidal ideation? Passive  Self-Injurious Behavior No self-injurious ideation or behavior indicators observed or expressed   Agreement Not to Harm Self Yes  Description of Agreement Written Contract  Danger to Others  Danger to Others None reported or observed

## 2021-07-09 NOTE — [facility] Group Notes (Signed)
Pt did not attend group. 

## 2021-07-09 NOTE — BHH Group Notes (Signed)
Pt. Didn't attend group. 

## 2021-07-09 NOTE — BHH Counselor (Signed)
CSW spoke with pt's [facility], Nelma Rothman, [phone] about pt's case.   Fredirick Lathe, LCSWA Clinicial Social Worker [employer]

## 2021-07-09 NOTE — Progress Notes (Signed)
   07/08/21 2059  Psych Admission Type (Psych Patients Only)  Admission Status Voluntary  Psychosocial Assessment  Patient Complaints Anxiety  Eye Contact Fair  Facial Expression Anxious;Flat  Affect Flat;Anxious  Speech Other (Comment) (deaf)  Interaction Assertive;Needy  Motor Activity Hyperactive;Fidgety  Appearance/Hygiene Unremarkable  Behavior Characteristics Cooperative  Mood Anxious  Thought Process  Coherency WDL  Content WDL  Delusions None reported or observed  Perception WDL  Hallucination None reported or observed  Judgment Poor  Confusion None  Danger to Self  Current suicidal ideation? Denies  Self-Injurious Behavior No self-injurious ideation or behavior indicators observed or expressed   Agreement Not to Harm Self Yes  Description of Agreement verbal contract  Danger to Others  Danger to Others None reported or observed

## 2021-07-09 NOTE — Progress Notes (Signed)
D:  Patient's self inventory sheet, patient has fair sleep, no sleep medication.   Good appetite, low energy level, poor concentration.  Rated depression and anxiety #10.  Denied withdrawals.  No withdrawals.  SI off/on, contracts for safety.  Denied physical problems.  Denied physical pain.  Goal is cope with anxiety. A:   Medications administered per MD orders.  Emotional support and encouragement given patient. R:  Denied SI and HI, contracts for safety.  Denied A/V hallucinations.  Safety maintained with 15 minute checks.

## 2021-07-10 MED ORDER — LOPERAMIDE HCL 2 MG PO CAPS: 2.0000 mg | ORAL_CAPSULE | ORAL | Status: DC | PRN

## 2021-07-10 MED ORDER — LOPERAMIDE HCL 2 MG PO CAPS
2.0000 mg | ORAL_CAPSULE | Freq: Two times a day (BID) | ORAL | Status: DC | PRN
  Administered 2021-07-11 – 2021-07-13 (×6): 2 mg via ORAL
  Filled 2021-07-10 (×6): qty 1

## 2021-07-10 NOTE — BHH Group Notes (Signed)
Pt did not attend relaxation group.  

## 2021-07-10 NOTE — Group Note (Signed)
Recreation Therapy Group Note   Group Topic:Stress Management  Group Date: 07/10/2021 Start Time: 0930 End Time: 0950 Facilitators: Caroll Rancher, LRT,CTRS Location: 300 Hall Dayroom  Goal Area(s) Addresses:  Patient will actively participate in stress management techniques presented during session.  Patient will successfully identify benefit of practicing stress management post d/c.   Group Description: Guided Imagery. LRT provided education, instruction, and demonstration on practice of visualization via guided imagery. Patient was asked to participate in the technique introduced during session. LRT debriefed including topics of mindfulness, stress management and specific scenarios each patient could use these techniques. Patients were given suggestions of ways to access scripts post d/c and encouraged to explore Youtube and other apps available on smartphones, tablets, and computers.   Affect/Mood: N/A   Participation Level: Did not attend    Clinical Observations/Individualized Feedback: Pt did not attend group.    Plan: Continue to engage patient in RT group sessions 2-3x/week.   Caroll Rancher, LRT,CTRS 07/10/2021 11:52 AM

## 2021-07-10 NOTE — Progress Notes (Signed)
   07/10/21 1400  Psych Admission Type (Psych Patients Only)  Admission Status Voluntary  Psychosocial Assessment  Patient Complaints Anxiety;Hopelessness;Depression  Eye Contact Fair  Facial Expression Anxious  Affect Anxious;Appropriate to circumstance  Speech Other (Comment) (Patient does not speak he is hard of hearing)  Interaction Assertive;Needy  Motor Activity Fidgety  Appearance/Hygiene Unremarkable  Behavior Characteristics Cooperative;Appropriate to situation  Mood Anxious  Thought Process  Coherency WDL  Content WDL  Delusions None reported or observed  Perception WDL  Hallucination None reported or observed  Judgment Poor  Confusion None  Danger to Self  Current suicidal ideation? Denies  Self-Injurious Behavior No self-injurious ideation or behavior indicators observed or expressed   Agreement Not to Harm Self Yes  Description of Agreement Written Contract  Danger to Others  Danger to Others None reported or observed

## 2021-07-10 NOTE — [facility] Group Notes (Signed)
Pt did not attend music therapy group.

## 2021-07-10 NOTE — Progress Notes (Signed)
Patient had reoccurring diarrhea. RN notified Melbourne Abts PA and received order for one time imodium 4mg . Will continue to monitor and assess. Patient asleep in room at this time.

## 2021-07-10 NOTE — Progress Notes (Signed)
   07/10/21 2147  Psych Admission Type (Psych Patients Only)  Admission Status Voluntary  Psychosocial Assessment  Patient Complaints Anxiety;Hopelessness;Depression  Eye Contact Fair  Facial Expression Anxious  Affect Appropriate to circumstance  Speech Other (Comment) (patient does not speak he is hard of hearing)  Interaction Assertive;Needy  Motor Activity Fidgety  Appearance/Hygiene Unremarkable  Behavior Characteristics Cooperative;Appropriate to situation  Mood Pleasant;Anxious  Thought Process  Coherency WDL  Content WDL  Delusions None reported or observed  Perception WDL  Hallucination None reported or observed  Judgment Poor  Confusion None  Danger to Self  Current suicidal ideation? Denies  Self-Injurious Behavior No self-injurious ideation or behavior indicators observed or expressed   Agreement Not to Harm Self Yes  Description of Agreement Written Contract  Danger to Others  Danger to Others None reported or observed

## 2021-07-10 NOTE — Progress Notes (Addendum)
[facility] MD Progress Note  07/10/2021 3:57 PM Tyler Horn  MRN:  [id number]  Subjective: (This assessment was conducted using a sign language interpreter): Tyler Horn reports, "The medication changes are helping. I think the Seroquel 50mg  during the day, the Seroquel 200mg  at night, the Gabapentin 200mg  three times a day and the other medications are all helping me".  Daily notes: Tyler Horn is seen, chart reviewed. The chart findings & treatment plan discussed with the treatment team. Patient is seen with an in person sign language interpreter (Amy) during this follow-up care evaluation. He is alert and oriented to person, place, time and situation. Patient is visible on the unit at times pacing on the 300 hall at times, but has mostly been lying in bed today, and states that he had diarrhea last night which started approximately around midnight through the rest of the night into the morning. As per [facility], and nurse's notes, he receive a one time dose of Imodium 4mg  which was effective in slowing down the diarrhea.   Tyler Horn's affect continues to be flat, however, he reports an improvement in his depressive symptoms. He denies suicidal ideations, denies homicidal ideations, denies auditory/visual hallucinations, and denies any paranoia, and there is no evidence of any delusions. He reports a good appetite, rates his mood as "4 or 5 mostly due to the diarrhea" (10 being the best mood). Pt also reports a good sleep quality, and as per flowsheets, he slept a total of 4.25 hours last night. Patient reports that physically, he feels "awful, weak due to the diarrhea", and states that he just ate a sandwich and is trying to drink more fluids. Pt was educated to keep drinking more fluids and is being given Gatorade by nursing staff.  Patient's only concern is that he continues to feel scared about being lonely and isolated when he goes home, and states that he missed paying rent due to being hospitalized, but is hoping that  social work will write a letter for him upon discharge which will help clarify to his landlord why his rent was late. Pt adds that he is stable financially, with money coming in from his disability. Tyler Horn adds that he plans to contact his friends more, to see if they can hang out, and plans to attend more events organized for deaf people such as a concert coming up next weekend.   Tyler Horn reports a positive effect with the medications changes completed yesterday.  Current plan of care as already in progress will be continued, and will add Immodium 4mg  as needed for diarrhea.  Per admission evaluation notes: Tyler Horn is a 60 year old male who is deaf and lives alone in an apartment. 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 He has outpatient medication management services with [name], PA at [facility] and therapy with Mel Almond at [facility]. His last medication management appointment was on 10/11, his next appointment is on 12/6. Patient is known to this facility and was recently admitted in September for the same or similar presentation. Patient stated he took 100 OTC sleeping pills and drank 2 bottles of Nyquil in an effort to end his life.   Principal Problem: MDD (major depressive disorder), recurrent, severe, with psychosis (HCC)  Diagnosis: Principal Problem:   MDD (major depressive disorder), recurrent, severe, with psychosis (HCC) Active Problems:   Suicidal thoughts   Human immunodeficiency virus infection (HCC)   MDD (major depressive disorder), recurrent severe, without psychosis (HCC)  Total Time spent with  patient:  35 minutes  Past Psychiatric History: See H&P  Past Medical History:  Past Medical History:  Diagnosis Date   Anxiety    Chronic hepatitis C (HCC) 04/27/2020   Depression    HIV (human immunodeficiency virus infection) (HCC)     Past Surgical History:  Procedure Laterality Date   CATARACT EXTRACTION Left    Family History:  Family History   Problem Relation Age of Onset   Mental illness Mother    Bipolar disorder Brother    Family Psychiatric  History: See H&P  Social History:  Social History   Substance and Sexual Activity  Alcohol Use No     Social History   Substance and Sexual Activity  Drug Use No    Social History   Socioeconomic History   Marital status: Single    Spouse name: Not on file   Number of children: Not on file   Years of education: Not on file   Highest education level: Not on file  Occupational History   Not on file  Tobacco Use   Smoking status: Never   Smokeless tobacco: Never  Vaping Use   Vaping Use: Never used  Substance and Sexual Activity   Alcohol use: No   Drug use: No   Sexual activity: Yes    Comment: pt given condoms  Other Topics Concern   Not on file  Social History Narrative   Not on file   Social Determinants of Health   Financial Resource Strain: Not on file  Food Insecurity: Not on file  Transportation Needs: Not on file  Physical Activity: Not on file  Stress: Not on file  Social Connections: Not on file   Additional Social History:   Sleep: Poor at 4.25 hours as per flowsheets documentation. States sleep last night was interrupted due to diarrhea.  Appetite:  Good  Current Medications: Current Facility-Administered Medications  Medication Dose Route Frequency Provider Last Rate Last Admin   acetaminophen (TYLENOL) tablet 650 mg  650 mg Oral Q6H PRN Nwoko, Uchenna E, PA       alum & mag hydroxide-simeth (MAALOX/MYLANTA) 200-200-20 MG/5ML suspension 30 mL  30 mL Oral Q4H PRN Nwoko, Uchenna E, PA       bictegravir-emtricitabine-tenofovir AF (BIKTARVY) 50-200-25 MG per tablet 1 tablet  1 tablet Oral Daily Melbourne Abts W, PA-C   1 tablet at 07/10/21 0842   busPIRone (BUSPAR) tablet 5 mg  5 mg Oral TID Laveda Abbe, NP   5 mg at 07/10/21 1345   gabapentin (NEURONTIN) capsule 200 mg  200 mg Oral TID Armandina Stammer I, NP   200 mg at 07/10/21 1345    hydrOXYzine (ATARAX/VISTARIL) tablet 25 mg  25 mg Oral TID PRN Nwoko, Uchenna E, PA   25 mg at 07/08/21 2147   magnesium hydroxide (MILK OF MAGNESIA) suspension 30 mL  30 mL Oral Daily PRN Nwoko, Uchenna E, PA   30 mL at 07/07/21 1430   propranolol (INDERAL) tablet 5 mg  5 mg Oral Daily Nwoko, Nicole Kindred I, NP   5 mg at 07/10/21 0842   QUEtiapine (SEROQUEL XR) 24 hr tablet 50 mg  50 mg Oral Daily Massengill, Harrold Donath, MD   50 mg at 07/10/21 0842   QUEtiapine (SEROQUEL) tablet 200 mg  200 mg Oral QHS Nwoko, Nicole Kindred I, NP   200 mg at 07/09/21 2118   QUEtiapine (SEROQUEL) tablet 25 mg  25 mg Oral Once Sanjuana Kava, NP  sertraline (ZOLOFT) tablet 50 mg  50 mg Oral Daily Massengill, Nathan, MD   50 mg at 07/10/21 0842   tamsulosin (FLOMAX) capsule 0.4 mg  0.4 mg Oral Daily Armandina Stammer I, NP   0.4 mg at 07/10/21 0842   traZODone (DESYREL) tablet 50 mg  50 mg Oral QHS PRN Nwoko, Uchenna E, PA   50 mg at 07/08/21 2146    Lab Results:  No results found for this or any previous visit (from the past 48 hour(s)).   Blood Alcohol level:  Lab Results  Component Value Date   ETH <10 07/05/2021   ETH <10 07/01/2021   Metabolic Disorder Labs: Lab Results  Component Value Date   HGBA1C 5.2 07/07/2021   MPG 102.54 07/07/2021   MPG 96.8 05/06/2021   No results found for: PROLACTIN Lab Results  Component Value Date   CHOL 174 07/07/2021   TRIG 95 07/07/2021   HDL 32 (L) 07/07/2021   CHOLHDL 5.4 07/07/2021   VLDL 19 07/07/2021   LDLCALC 123 (H) 07/07/2021   LDLCALC 90 05/06/2021   Physical Findings: AIMS: Facial and Oral Movements Muscles of Facial Expression: Mild Lips and Perioral Area: Moderate Jaw: Minimal Tongue: Minimal,Extremity Movements Upper (arms, wrists, hands, fingers): None, normal Lower (legs, knees, ankles, toes): Mild, Trunk Movements Neck, shoulders, hips: None, normal, Overall Severity Severity of abnormal movements (highest score from questions above):  Moderate Incapacitation due to abnormal movements: None, normal Patient's awareness of abnormal movements (rate only patient's report): Aware, no distress, Dental Status Current problems with teeth and/or dentures?: No Does patient usually wear dentures?: No  CIWA:  CIWA-Ar Total: 0 COWS:     Musculoskeletal: Strength & Muscle Tone: within normal limits Gait & Station: normal Patient leans: N/A  Psychiatric Specialty Exam:  Presentation  General Appearance: Disheveled  Eye Contact:Good  Speech: Sign language (interpretation by hospital provided sign language interpreter (Amy)).  Speech Volume: Uses sign language interpreter.  Handedness:Right  Mood and Affect  Mood:Depressed (improving on medications)  Affect: Flat  Thought Process  Thought Processes:Coherent; Goal Directed  Descriptions of Associations:Intact  Orientation:Full (Time, Place and Person)  Thought Content:Logical  History of Schizophrenia/Schizoaffective disorder:No  Duration of Psychotic Symptoms:N/A  Hallucinations: None  Ideas of Reference:None  Suicidal Thoughts:None  Homicidal Thoughts:Homicidal Thoughts: No  Sensorium  Memory:Immediate Good  Judgment:Fair  Insight:Good  Executive Functions  Concentration:Good  Attention Span:Good  Recall:Good  Fund of Knowledge:Good  Language:Other (comment) (uses sign language)  Psychomotor Activity  Psychomotor Activity:Psychomotor Activity: Normal  Assets  Assets:Communication Skills; Desire for Improvement; Financial Resources/Insurance; Housing  Sleep  Sleep:Sleep: Fair (slept 4.25 hours last night, states sleep has been good, but could not sleep well last night due to diarrhea) Number of Hours of Sleep: 4.25  Physical Exam: Physical Exam Vitals and nursing note reviewed.  HENT:     Head: Normocephalic.     Ears:     Comments: Deafness    Nose: Nose normal.  Cardiovascular:     Rate and Rhythm: Normal rate.     Pulses:  Normal pulses.  Pulmonary:     Effort: Pulmonary effort is normal.  Genitourinary:    Comments: Hx. BPH Musculoskeletal:        General: Normal range of motion.     Cervical back: Normal range of motion.  Skin:    General: Skin is warm and dry.  Neurological:     General: No focal deficit present.     Mental Status: He is alert  and oriented to person, place, and time.  Psychiatric:        Thought Content: Thought content normal.   Review of Systems  Constitutional:  Positive for malaise/fatigue (complains of feeling generalized weakness due to having diarrhea multiple times last night). Negative for chills, diaphoresis and fever.  HENT:  Positive for hearing loss. Negative for congestion and sore throat.   Eyes:  Negative for blurred vision.  Respiratory:  Negative for cough, shortness of breath and wheezing.   Cardiovascular:  Negative for chest pain and palpitations.  Gastrointestinal:  Positive for diarrhea (complains of multiple bouts of diarrhea last night into the morning. Vital signs are WNL). Negative for abdominal pain, blood in stool, constipation, heartburn, melena, nausea and vomiting.  Genitourinary:  Negative for dysuria.  Musculoskeletal:  Negative for joint pain and myalgias.  Skin: Negative.   Neurological:  Positive for weakness (due to diarrhea last night). Negative for dizziness, tingling, tremors, sensory change, speech change, focal weakness, seizures, loss of consciousness and headaches.  Endo/Heme/Allergies:  Negative for environmental allergies and polydipsia. Does not bruise/bleed easily.       Allergies: NKDA  Psychiatric/Behavioral:  Positive for depression (improving with current medications). Negative for hallucinations (Hx. VH), memory loss, substance abuse and suicidal ideas. The patient is not nervous/anxious and does not have insomnia.   Blood pressure 102/64, pulse 92, temperature 98.7 F (37.1 C), temperature source Oral, resp. rate 16, height 6\' 4"   (1.93 m), weight 88.5 kg, SpO2 96 %. Body mass index is 23.74 kg/m.  Treatment Plan Summary: Daily contact with patient to assess and evaluate symptoms and progress in treatment and Medication management  Continue inpatient hospitalization.  Will continue today 07/10/2021 plan as below except where it is noted.   Diagnoses:  Major depressive disorder, recurrent episodes, severe with psychosis. HIV.  Anxiety. Continue Vistaril 25 mg po tid prn. Continue Gabapentin 200 mg po tid. Continue Propranolol 0.5 mg po daily for anxiety/elevated heart rate. Continue Buspar 5 mg po tid.  Depression with psychosis. Continue Sertraline 50 mg po daily. Continue Seroquel  200 mg po Q hs. Continue Seroquel 50 mg po daily for agitation.                   Insomnia. Continue Trazodone 50 mg po Q hs prn.  Diarrhea: Start Immodium 2mg  twice a day PRN for diarrhea.   Other medical issues: continue. Biktarvy 50-200-25 mg po daily for HIV. Flomax 0.4 mg po daily for BPH.  Mylanta 30 ml po Q 4 hrs prn for indigestion. MOM 30 ml po daily prn for constipation. Acetaminophen 650 mg po Q 4 hrs prn for pain/fever.  Encourage group participation.  Discharge disposition plan is ongoing.             Starleen Blue, NP. 07/10/2021, 3:57 PM Patient ID: [name], male   DOB: Sep 04, 1960, 60 y.o.   MRN: [id number] Patient ID: Quatez Hilt, male   DOB: [birth year], 60 y.o.   MRN: [id number]

## 2021-07-10 NOTE — [facility] Group Notes (Signed)
Did not attend orientation goals group.

## 2021-07-10 NOTE — [facility] Group Notes (Signed)
PsychoEducational Group- Self Care Toolkit and Sleep Hygiene were discussed in group. The patients were asked to identify healthy and unhealthy behaviors as it relates to sleep hygiene. Patients were also asked to identify simple was to improve self care, identifying a gratitude list, establishing healthy boundaries and recognizing the impact of unhealthy boundaries as it impacts mental health. Pt did not attend.

## 2021-07-10 NOTE — Group Note (Signed)
LCSW Group Therapy Note   Group Date: 07/10/2021 Start Time: 1300 End Time: 1400   Type of Therapy and Topic:  Group Therapy: Coping Skills  Participation Level:  Active   Due to the acuity and complex discharge plans, group was not held. Patient was provided therapeutic worksheets and asked to meet with CSW as needed.  Felizardo Hoffmann, LCSWA 07/10/2021  1:40 PM

## 2021-07-11 DIAGNOSIS — F333 Major depressive disorder, recurrent, severe with psychotic symptoms: Secondary | ICD-10-CM | POA: Diagnosis not present

## 2021-07-11 NOTE — Progress Notes (Signed)
MHT notified RN of low standing BP. Patient informed RN he was feeling dizzy. RN gave patient PO fluids will notify dayshift RN and provider. Will reassess and monitor.

## 2021-07-11 NOTE — Group Note (Signed)
Date:  07/11/2021 Time:  9:21 AM  Group Topic/Focus:  Goals Group:   The focus of this group is to help patients establish daily goals to achieve during treatment and discuss how the patient can incorporate goal setting into their daily lives to aide in recovery. Orientation:   The focus of this group is to educate the patient on the purpose and policies of crisis stabilization and provide a format to answer questions about their admission.  The group details unit policies and expectations of patients while admitted.    Participation Level:  Did Not Attend 

## 2021-07-11 NOTE — Progress Notes (Signed)
   07/11/21 1400  Psych Admission Type (Psych Patients Only)  Admission Status Voluntary  Psychosocial Assessment  Patient Complaints Anxiety;Depression  Eye Contact Fair  Facial Expression Anxious  Affect Appropriate to circumstance  Speech Other (Comment) (patient does not speak he is hard of hearing)  Interaction Assertive  Motor Activity Fidgety  Appearance/Hygiene Unremarkable  Behavior Characteristics Cooperative;Appropriate to situation  Mood Anxious;Depressed  Thought Process  Coherency WDL  Content WDL  Delusions None reported or observed  Perception WDL  Hallucination None reported or observed  Judgment Poor  Confusion None  Danger to Self  Current suicidal ideation? Denies  Self-Injurious Behavior No self-injurious ideation or behavior indicators observed or expressed   Agreement Not to Harm Self Yes  Description of Agreement Written Contract  Danger to Others  Danger to Others None reported or observed

## 2021-07-11 NOTE — Group Note (Signed)
LCSW Group Therapy Note  07/11/2021    10:00-11:00am   Type of Therapy and Topic:  Group Therapy: Early Messages Received About Anger  Participation Level:  Did Not Attend   Description of Group:   In this group, patients shared and discussed the early messages received in their lives about anger through parental or other adult modeling, teaching, repression, punishment, violence, and more.  Participants identified how those childhood lessons influence even now how they usually or often react when angered.  The group discussed that anger is a secondary emotion and what may be the underlying emotional themes that come out through anger outbursts or that are ignored through anger suppression.    Therapeutic Goals: Patients will identify one or more childhood message about anger that they received and how it was taught to them. Patients will discuss how these childhood experiences have influenced and continue to influence their own expression or repression of anger even today. Patients will explore possible primary emotions that tend to fuel their secondary emotion of anger. Patients will learn that anger itself is normal and cannot be eliminated, and that healthier coping skills can assist with resolving conflict rather than worsening situations.  Summary of Patient Progress:  The patient was invited to group, did not attend.  Therapeutic Modalities:   Cognitive Behavioral Therapy Motivation Interviewing  Lashay Osborne J Grossman-Orr  .  

## 2021-07-11 NOTE — [facility] Group Notes (Signed)
1600 - PsychoEducational group- Patients were educated on the power of negative versus positive thinking, and the habits of both and impact on our mental health. Patients were educated on positive re-framing and given examples in day to day life. Pts were given example by showing spaghetti noodles, while together, one thought is not much, over and over becomes more powerful, and harder to break. Patients were then asked to break a noodle and give an example of a negative thought in their life they needed to break free from. Pt did not attend.

## 2021-07-11 NOTE — Progress Notes (Signed)
   07/11/21 2236  Psych Admission Type (Psych Patients Only)  Admission Status Voluntary  Psychosocial Assessment  Patient Complaints Anxiety  Eye Contact Fair  Facial Expression Anxious  Affect Appropriate to circumstance  Speech Other (Comment) (Pt is hearing impaired)  Interaction Minimal  Motor Activity Other (Comment) (WDL)  Appearance/Hygiene Unremarkable  Behavior Characteristics Appropriate to situation  Mood Depressed  Thought Process  Coherency WDL  Content WDL  Delusions None reported or observed  Perception WDL  Hallucination None reported or observed  Judgment Poor  Confusion None  Danger to Self  Current suicidal ideation? Denies  Self-Injurious Behavior No self-injurious ideation or behavior indicators observed or expressed   Agreement Not to Harm Self Yes  Description of Agreement Written Contract  Danger to Others  Danger to Others None reported or observed

## 2021-07-11 NOTE — Group Note (Signed)
Date:  07/11/2021 Time:  10:47 AM  Number of Participants: 10  Group Focus: daily focus Treatment Modality:  Psychoeducation Interventions utilized were group exercise Purpose: express feelings and increase insight     Participation Level:  Did Not Attend

## 2021-07-11 NOTE — Progress Notes (Addendum)
[facility] MD Progress Note  07/11/2021 9:48 AM Lavelle Stoever  MRN:  [id number]  Subjective: (This assessment was conducted using a sign language interpreter): Jeramyah reports, "I have been having diarrhea for 2 days but it is getting better today. The medication changes are helping. I think the Seroquel 50mg  during the day, the Seroquel 200mg  at night, the Gabapentin 200mg  three times a day and the other medications are all helping me".  Evaluation on the unit today 11/12: Reshad is seen, chart reviewed and case discussed with the treatment team. Patient is seen with an in person sign language interpreter. He is alert and oriented, calm and cooperative.  Patient has mostly been lying in bed today, and states that he had diarrhea but believes it is getting better today. He stated he feels dizzy at times. He was cautioned to get up slowly and to push fluids. He is complaint with his medications and stated he feels they are helping him.   Daniyal's affect continues to be flat, however, he reports an improvement in his depressive symptoms. He stated he has suicidal ideation intermittently, he denies homicidal ideations, denies auditory/visual hallucinations, and denies paranoia and delusions.  He reports a good appetite, rates his mood  is improving. Pt also reported good sleep, he slept a total of 4.25 hours last night.   He stated that he missed paying his rent due to being hospitalized, he is hoping that social work will write a letter for him upon discharge which will help clarify to his landlord why his rent was late. Pt adds that he is stable financially, with money coming in from his disability. Brantson adds that he plans to contact his friends more, to see if they can hang out, and plans to attend more events organized for deaf people such as a concert coming up next weekend. He is worried about being lonely when he gets home. Will continue to monitor and plan for discharge soon.   Per admission evaluation  notes: Larnie Standford is a 60 year old male who is deaf and lives alone in an apartment. He is a retired Education officer, [employer]. He has outpatient medication management services with [name], PA at [facility] and therapy with Jessica Priest at [facility]. His last medication management appointment was on 10/11, his next appointment is on 12/6. Patient is known to this facility and was recently admitted in September for the same or similar presentation. Patient stated he took 100 OTC sleeping pills and drank 2 bottles of Nyquil in an effort to end his life.   Principal Problem: MDD (major depressive disorder), recurrent, severe, with psychosis ([place])  Diagnosis: Principal Problem:   MDD (major depressive disorder), recurrent, severe, with psychosis ([place]) Active Problems:   Suicidal thoughts   Human immunodeficiency virus infection ([place])   MDD (major depressive disorder), recurrent severe, without psychosis ([place])  Total Time spent with patient:  25 minutes  Past Psychiatric History: See H&P  Past Medical History:  Past Medical History:  Diagnosis Date   Anxiety    Chronic hepatitis C ([place]) 04/27/2020   Depression    HIV (human immunodeficiency virus infection) ([place])     Past Surgical History:  Procedure Laterality Date   CATARACT EXTRACTION Left    Family History:  Family History  Problem Relation Age of Onset   Mental illness Mother    Bipolar disorder Brother    Family Psychiatric  History: See H&P  Social History:  Social History  Substance and Sexual Activity  Alcohol Use No     Social History   Substance and Sexual Activity  Drug Use No    Social History   Socioeconomic History   Marital status: Single    Spouse name: Not on file   Number of children: Not on file   Years of education: Not on file   Highest education level: Not on file  Occupational History   Not on file  Tobacco Use   Smoking status: Never   Smokeless tobacco: Never  Vaping Use    Vaping Use: Never used  Substance and Sexual Activity   Alcohol use: No   Drug use: No   Sexual activity: Yes    Comment: pt given condoms  Other Topics Concern   Not on file  Social History Narrative   Not on file   Social Determinants of Health   Financial Resource Strain: Not on file  Food Insecurity: Not on file  Transportation Needs: Not on file  Physical Activity: Not on file  Stress: Not on file  Social Connections: Not on file   Additional Social History:   Sleep: Poor at 4.25 hours as per flowsheets documentation. States sleep last night was interrupted due to diarrhea.  Appetite:  Good  Current Medications: Current Facility-Administered Medications  Medication Dose Route Frequency Provider Last Rate Last Admin   acetaminophen (TYLENOL) tablet 650 mg  650 mg Oral Q6H PRN Nwoko, Uchenna E, PA       alum & mag hydroxide-simeth (MAALOX/MYLANTA) 200-200-20 MG/5ML suspension 30 mL  30 mL Oral Q4H PRN Nwoko, Uchenna E, PA       bictegravir-emtricitabine-tenofovir AF (BIKTARVY) 50-200-25 MG per tablet 1 tablet  1 tablet Oral Daily Margorie John W, PA-C   1 tablet at 07/11/21 0800   busPIRone (BUSPAR) tablet 5 mg  5 mg Oral TID Ethelene Hal, NP   5 mg at 07/11/21 0802   gabapentin (NEURONTIN) capsule 200 mg  200 mg Oral TID Lindell Spar I, NP   200 mg at 07/11/21 0800   hydrOXYzine (ATARAX/VISTARIL) tablet 25 mg  25 mg Oral TID PRN Nwoko, Uchenna E, PA   25 mg at 07/08/21 2147   loperamide (IMODIUM) capsule 2 mg  2 mg Oral BID PRN Nicholes Rough, NP   2 mg at 07/11/21 [id number]   magnesium hydroxide (MILK OF MAGNESIA) suspension 30 mL  30 mL Oral Daily PRN Nwoko, Uchenna E, PA   30 mL at 07/07/21 1430   propranolol (INDERAL) tablet 5 mg  5 mg Oral Daily Nwoko, Agnes I, NP   5 mg at 07/11/21 0800   QUEtiapine (SEROQUEL XR) 24 hr tablet 50 mg  50 mg Oral Daily Massengill, Nathan, MD   50 mg at 07/11/21 0800   QUEtiapine (SEROQUEL) tablet 200 mg  200 mg Oral QHS Nwoko, Agnes  I, NP   200 mg at 07/10/21 2148   QUEtiapine (SEROQUEL) tablet 25 mg  25 mg Oral Once Lindell Spar I, NP       sertraline (ZOLOFT) tablet 50 mg  50 mg Oral Daily Massengill, Nathan, MD   50 mg at 07/11/21 0800   tamsulosin (FLOMAX) capsule 0.4 mg  0.4 mg Oral Daily Nwoko, Herbert Pun I, NP   0.4 mg at 07/11/21 0800   traZODone (DESYREL) tablet 50 mg  50 mg Oral QHS PRN Nwoko, Uchenna E, PA   50 mg at 07/08/21 2146    Lab Results:  No results found for this  or any previous visit (from the past 48 hour(s)).   Blood Alcohol level:  Lab Results  Component Value Date   ETH <10 07/05/2021   ETH <10 07/01/2021   Metabolic Disorder Labs: Lab Results  Component Value Date   HGBA1C 5.2 07/07/2021   MPG 102.54 07/07/2021   MPG 96.8 05/06/2021   No results found for: PROLACTIN Lab Results  Component Value Date   CHOL 174 07/07/2021   TRIG 95 07/07/2021   HDL 32 (L) 07/07/2021   CHOLHDL 5.4 07/07/2021   VLDL 19 07/07/2021   LDLCALC 123 (H) 07/07/2021   LDLCALC 90 05/06/2021   Physical Findings: AIMS: Facial and Oral Movements Muscles of Facial Expression: Mild Lips and Perioral Area: Moderate Jaw: Minimal Tongue: Minimal,Extremity Movements Upper (arms, wrists, hands, fingers): None, normal Lower (legs, knees, ankles, toes): Mild, Trunk Movements Neck, shoulders, hips: None, normal, Overall Severity Severity of abnormal movements (highest score from questions above): Moderate Incapacitation due to abnormal movements: None, normal Patient's awareness of abnormal movements (rate only patient's report): Aware, no distress, Dental Status Current problems with teeth and/or dentures?: No Does patient usually wear dentures?: No  CIWA:  CIWA-Ar Total: 0 COWS:     Musculoskeletal: Strength & Muscle Tone: within normal limits Gait & Station: normal Patient leans: N/A  Psychiatric Specialty Exam:  Presentation  General Appearance: Disheveled  Eye Contact:Good  Speech: Sign language  (interpretation by hospital provided sign language interpreter (Dejanae Helser)).  Speech Volume: Uses sign language interpreter.  Handedness:Right  Mood and Affect  Mood:Depressed (improving on medications)  Affect: Flat  Thought Process  Thought Processes:Coherent; Goal Directed  Descriptions of Associations:Intact  Orientation:Full (Time, Place and Person)  Thought Content:Logical  History of Schizophrenia/Schizoaffective disorder:No  Duration of Psychotic Symptoms:N/A  Hallucinations: None  Ideas of Reference:None  Suicidal Thoughts:None  Homicidal Thoughts:Homicidal Thoughts: No  Sensorium  Memory:Immediate Good  Judgment:Fair  Insight:Good  Executive Functions  Concentration:Good  Attention Span:Good  Recall:Good  Fund of Knowledge:Good  Language:Other (comment) (uses sign language)  Psychomotor Activity  Psychomotor Activity:Psychomotor Activity: Normal  Assets  Assets:Communication Skills; Desire for Improvement; Financial Resources/Insurance; Housing  Sleep  Sleep:Sleep: Fair (slept 4.25 hours last night, states sleep has been good, but could not sleep well last night due to diarrhea) Number of Hours of Sleep: 4.25  Physical Exam: Physical Exam Vitals and nursing note reviewed.  HENT:     Head: Normocephalic.     Ears:     Comments: Deafness    Nose: Nose normal.  Cardiovascular:     Rate and Rhythm: Normal rate.     Pulses: Normal pulses.  Pulmonary:     Effort: Pulmonary effort is normal.  Genitourinary:    Comments: Hx. BPH Musculoskeletal:        General: Normal range of motion.     Cervical back: Normal range of motion.  Skin:    General: Skin is warm and dry.  Neurological:     General: No focal deficit present.     Mental Status: He is alert and oriented to person, place, and time.  Psychiatric:        Thought Content: Thought content normal.   Review of Systems  Constitutional:  Positive for malaise/fatigue (complains of  feeling generalized weakness due to having diarrhea multiple times last night). Negative for chills, diaphoresis and fever.  HENT:  Positive for hearing loss. Negative for congestion and sore throat.   Eyes:  Negative for blurred vision.  Respiratory:  Negative for cough,  shortness of breath and wheezing.   Cardiovascular:  Negative for chest pain and palpitations.  Gastrointestinal:  Positive for diarrhea (complains of multiple bouts of diarrhea last night into the morning. Vital signs are WNL). Negative for abdominal pain, blood in stool, constipation, heartburn, melena, nausea and vomiting.  Genitourinary:  Negative for dysuria.  Musculoskeletal:  Negative for joint pain and myalgias.  Skin: Negative.   Neurological:  Positive for weakness (due to diarrhea last night). Negative for dizziness, tingling, tremors, sensory change, speech change, focal weakness, seizures, loss of consciousness and headaches.  Endo/Heme/Allergies:  Negative for environmental allergies and polydipsia. Does not bruise/bleed easily.       Allergies: NKDA  Psychiatric/Behavioral:  Positive for depression (improving with current medications). Negative for hallucinations (Hx. VH), memory loss, substance abuse and suicidal ideas. The patient is not nervous/anxious and does not have insomnia.   Blood pressure 125/70, pulse (!) 117, temperature (!) 97.5 F (36.4 C), temperature source Oral, resp. rate 18, height 6\' 4"  (1.93 m), weight 88.5 kg, SpO2 99 %. Body mass index is 23.74 kg/m.  Treatment Plan Summary: Daily contact with patient to assess and evaluate symptoms and progress in treatment and Medication management  Continue inpatient hospitalization.  Will continue today 07/11/2021 plan as below except where it is noted.   Diagnoses:  Major depressive disorder, recurrent episodes, severe with psychosis. HIV.  Anxiety. Continue Vistaril 25 mg po tid prn. Continue Gabapentin 200 mg po tid. Continue Propranolol 5  mg po daily for anxiety/elevated heart rate. Continue Buspar 5 mg po tid.  Depression with psychosis. Continue Sertraline 50 mg po daily. Continue Seroquel  200 mg po Q hs. Continue Seroquel 50 mg po daily for agitation.                   Insomnia. Continue Trazodone 50 mg po Q hs prn.  Diarrhea: Continue Imodium 2mg  twice a day PRN for diarrhea.   Other medical issues: continue. Biktarvy 50-200-25 mg po daily for HIV. Flomax 0.4 mg po daily for BPH.  Mylanta 30 ml po Q 4 hrs prn for indigestion. MOM 30 ml po daily prn for constipation. Acetaminophen 650 mg po Q 4 hrs prn for pain/fever.  Encourage group participation.  Discharge disposition plan is ongoing.             Ethelene Hal, NP. 07/11/2021, 4:28 PM  I have reviewed the patient's chart and discussed the case with the APP. I agree with the assessment and plan of care as documented in the APP's note.  Viann Fish, MD, [facility]

## 2021-07-12 DIAGNOSIS — F333 Major depressive disorder, recurrent, severe with psychotic symptoms: Secondary | ICD-10-CM | POA: Diagnosis not present

## 2021-07-12 LAB — COMPREHENSIVE METABOLIC PANEL
ALT: 12 U/L (ref 0–44)
AST: 16 U/L (ref 15–41)
Albumin: 4 g/dL (ref 3.5–5.0)
Alkaline Phosphatase: 83 U/L (ref 38–126)
Anion gap: 6 (ref 5–15)
BUN: 13 mg/dL (ref 6–20)
CO2: 24 mmol/L (ref 22–32)
Calcium: 9 mg/dL (ref 8.9–10.3)
Chloride: 105 mmol/L (ref 98–111)
Creatinine, Ser: 1.12 mg/dL (ref 0.61–1.24)
GFR, Estimated: >60 mL/min (ref >60)
Glucose, Bld: 104 mg/dL — ABNORMAL HIGH (ref 70–99)
Potassium: 3.6 mmol/L (ref 3.5–5.1)
Sodium: 135 mmol/L (ref 135–145)
Total Bilirubin: 0.6 mg/dL (ref 0.3–1.2)
Total Protein: 7.3 g/dL (ref 6.5–8.1)

## 2021-07-12 MED ORDER — BENZTROPINE MESYLATE 0.5 MG PO TABS
0.5000 mg | ORAL_TABLET | Freq: Two times a day (BID) | ORAL | Status: DC | PRN
  Administered 2021-07-12 – 2021-07-13 (×2): 0.5 mg via ORAL
  Filled 2021-07-12 (×2): qty 1

## 2021-07-12 MED ORDER — BIOTENE DRY MOUTH MT LIQD
15.0000 mL | OROMUCOSAL | Status: DC | PRN
  Administered 2021-07-13 – 2021-07-14 (×5): 15 mL via OROMUCOSAL
  Filled 2021-07-12: qty 15

## 2021-07-12 NOTE — [facility] Group Notes (Signed)
PT was notified but did not attend group. 

## 2021-07-12 NOTE — [facility] Group Notes (Signed)
Pt did not attend goals group. 

## 2021-07-12 NOTE — [facility] Suicide Risk Assessment (Addendum)
[facility] INPATIENT:  Family/Significant Other Suicide Prevention Education  Suicide Prevention Education:  Contact Attempts: Silas Flood) [phone], (name of family member/significant other) has been identified by the patient as the family member/significant other with whom the patient will be residing, and identified as the person(s) who will aid the patient in the event of a mental health crisis.  With written consent from the patient, two attempts were made to provide suicide prevention education, prior to and/or following the patient's discharge.  We were unsuccessful in providing suicide prevention education.  A suicide education pamphlet was given to the patient to share with family/significant other.  Date and time of first attempt:  07/12/2021  /  2:30pm HIPAA-compliant voicemail left, no return number left Date and time of second attempt:  07/13/2021  /  10:26am  HIPAA-compliant voicemail left,  [name] 07/12/2021, 2:33 PM

## 2021-07-12 NOTE — Progress Notes (Signed)
[facility] MD Progress Note  07/12/2021 11:59 AM Tyler Horn  MRN:  [id number]  Subjective: (This assessment was conducted using a sign language interpreter): Curtez reports, "I am still having diarrhea, I think it is getting better though."   Objective: Daelon Dunivan is a 60 year old male who is deaf and lives alone in an apartment. 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 He has outpatient medication management services with [name], PA at [facility] and therapy with Mel Almond at [facility] [place]. His last medication management appointment was on 10/11, his next appointment is on 12/6. Patient is known to this facility and was recently admitted in September for the same or similar presentation. Patient stated he took 100 OTC sleeping pills and drank 2 bottles of Nyquil in an effort to end his life.   Evaluation on the unit today 11/13: Phares is seen, chart reviewed and case discussed with the treatment team. Patient is seen with an in person sign language interpreter, Loistine Simas. He is alert, oriented, calm and cooperative. Patient continues to stay in his room due to having diarrhea. He does try to attend group when he feels like he can. He stated he is still feeling dizzy. He was cautioned to get up slowly and to push fluids. He reported a god appetite. He is complaint with his medications and stated he feels they are helping him. Due to his having diarrhea we will order a repeat CMP.  Sewell's affect continues to be flat, however, he reports an improvement in his depressive symptoms. He stated he has suicidal ideation intermittently. He stated he is afraid he will hurt himself if he gets home and becomes lonely. We discussed his plan for keeping himself safe. He stated he will go out for a drive or a walk, reconnect with the deaf community, use his coping skills and eventually he would like to get a service animal. He stated he had a dog before that passed away. He stated he lost touch with the deaf  community during COVID. He also stated that many people don't want to deal with him because of his mental health issues. We discussed the importance of not feeling like he had to divulge his mental health issues to people other than close friends and family. He denies homicidal ideations, denies auditory/visual hallucinations, and denies paranoia and delusions.  He reports a good appetite. Pt also reported fair sleep, sleep hours were not recorded last night.   He stated that he missed paying his rent due to being hospitalized, he is hoping that social work will write a letter for him upon discharge which will help clarify to his landlord why his rent was late. Pt adds that he is stable financially, with money coming in from his disability. Omaree adds that he plans to contact his friends more, to see if they can hang out, and plans to attend more events organized for deaf people such as a concert coming up next weekend. Patient stated he feels he will be ready for discharge on Tuesday.    Principal Problem: MDD (major depressive disorder), recurrent, severe, with psychosis (HCC)  Diagnosis: Principal Problem:   MDD (major depressive disorder), recurrent, severe, with psychosis (HCC) Active Problems:   Suicidal thoughts   Human immunodeficiency virus infection (HCC)   MDD (major depressive disorder), recurrent severe, without psychosis (HCC)  Total Time spent with patient:  25 minutes  Past Psychiatric History: See H&P  Past Medical History:  Past Medical History:  Diagnosis Date   Anxiety    Chronic hepatitis C ([place]) 04/27/2020   Depression    HIV (human immunodeficiency virus infection) ([place])     Past Surgical History:  Procedure Laterality Date   CATARACT EXTRACTION Left    Family History:  Family History  Problem Relation Age of Onset   Mental illness Mother    Bipolar disorder Brother    Family Psychiatric  History: See H&P  Social History:  Social History   Substance and  Sexual Activity  Alcohol Use No     Social History   Substance and Sexual Activity  Drug Use No    Social History   Socioeconomic History   Marital status: Single    Spouse name: Not on file   Number of children: Not on file   Years of education: Not on file   Highest education level: Not on file  Occupational History   Not on file  Tobacco Use   Smoking status: Never   Smokeless tobacco: Never  Vaping Use   Vaping Use: Never used  Substance and Sexual Activity   Alcohol use: No   Drug use: No   Sexual activity: Yes    Comment: pt given condoms  Other Topics Concern   Not on file  Social History Narrative   Not on file   Social Determinants of Health   Financial Resource Strain: Not on file  Food Insecurity: Not on file  Transportation Needs: Not on file  Physical Activity: Not on file  Stress: Not on file  Social Connections: Not on file   Additional Social History:   Sleep: Per patient fair sleep  Appetite:  Good  Current Medications: Current Facility-Administered Medications  Medication Dose Route Frequency Provider Last Rate Last Admin   acetaminophen (TYLENOL) tablet 650 mg  650 mg Oral Q6H PRN Nwoko, Uchenna E, PA       alum & mag hydroxide-simeth (MAALOX/MYLANTA) 200-200-20 MG/5ML suspension 30 mL  30 mL Oral Q4H PRN Nwoko, Uchenna E, PA       bictegravir-emtricitabine-tenofovir AF (BIKTARVY) 50-200-25 MG per tablet 1 tablet  1 tablet Oral Daily Margorie John W, PA-C   1 tablet at 07/12/21 0803   busPIRone (BUSPAR) tablet 5 mg  5 mg Oral TID Ethelene Hal, NP   5 mg at 07/12/21 0803   gabapentin (NEURONTIN) capsule 200 mg  200 mg Oral TID Lindell Spar I, NP   200 mg at 07/12/21 0802   hydrOXYzine (ATARAX/VISTARIL) tablet 25 mg  25 mg Oral TID PRN Nwoko, Uchenna E, PA   25 mg at 07/08/21 2147   loperamide (IMODIUM) capsule 2 mg  2 mg Oral BID PRN Nicholes Rough, NP   2 mg at 07/12/21 0803   magnesium hydroxide (MILK OF MAGNESIA) suspension 30 mL   30 mL Oral Daily PRN Nwoko, Uchenna E, PA   30 mL at 07/07/21 1430   propranolol (INDERAL) tablet 5 mg  5 mg Oral Daily Nwoko, Agnes I, NP   5 mg at 07/12/21 0804   QUEtiapine (SEROQUEL XR) 24 hr tablet 50 mg  50 mg Oral Daily Massengill, Nathan, MD   50 mg at 07/12/21 0802   QUEtiapine (SEROQUEL) tablet 200 mg  200 mg Oral QHS Nwoko, Agnes I, NP   200 mg at 07/11/21 2135   QUEtiapine (SEROQUEL) tablet 25 mg  25 mg Oral Once Lindell Spar I, NP       sertraline (ZOLOFT) tablet 50 mg  50 mg  Oral Daily Massengill, Nathan, MD   50 mg at 07/12/21 0802   tamsulosin (FLOMAX) capsule 0.4 mg  0.4 mg Oral Daily Lindell Spar I, NP   0.4 mg at 07/12/21 0802   traZODone (DESYREL) tablet 50 mg  50 mg Oral QHS PRN Nwoko, Uchenna E, PA   50 mg at 07/08/21 2146    Lab Results:  No results found for this or any previous visit (from the past 48 hour(s)).   Blood Alcohol level:  Lab Results  Component Value Date   ETH <10 07/05/2021   ETH <10 [id number]   Metabolic Disorder Labs: Lab Results  Component Value Date   HGBA1C 5.2 07/07/2021   MPG 102.54 07/07/2021   MPG 96.8 05/06/2021   No results found for: PROLACTIN Lab Results  Component Value Date   CHOL 174 07/07/2021   TRIG 95 07/07/2021   HDL 32 (L) 07/07/2021   CHOLHDL 5.4 07/07/2021   VLDL 19 07/07/2021   LDLCALC 123 (H) 07/07/2021   LDLCALC 90 05/06/2021   Physical Findings: AIMS: Facial and Oral Movements Muscles of Facial Expression: Mild Lips and Perioral Area: Moderate Jaw: Minimal Tongue: Minimal,Extremity Movements Upper (arms, wrists, hands, fingers): None, normal Lower (legs, knees, ankles, toes): Mild, Trunk Movements Neck, shoulders, hips: None, normal, Overall Severity Severity of abnormal movements (highest score from questions above): Moderate Incapacitation due to abnormal movements: None, normal Patient's awareness of abnormal movements (rate only patient's report): Aware, no distress, Dental Status Current  problems with teeth and/or dentures?: No Does patient usually wear dentures?: No  CIWA:  CIWA-Ar Total: 0 COWS:     Musculoskeletal: Strength & Muscle Tone: within normal limits Gait & Station: normal Patient leans: N/A  Psychiatric Specialty Exam:  Presentation  General Appearance: Disheveled  Eye Contact:Good  Speech: Sign language (interpretation by hospital provided sign language interpreter (Amy)).  Speech Volume: Uses sign language interpreter.  Handedness:Right  Mood and Affect  Mood:Depressed (improving on medications)  Affect: Flat  Thought Process  Thought Processes:Coherent; Goal Directed  Descriptions of Associations:Intact  Orientation:Full (Time, Place and Person)  Thought Content:Logical  History of Schizophrenia/Schizoaffective disorder:No  Duration of Psychotic Symptoms:N/A  Hallucinations: None  Ideas of Reference:None  Suicidal Thoughts:None  Homicidal Thoughts:No data recorded  Sensorium  Memory:Immediate Good  Judgment:Fair  Insight:Good  Executive Functions  Concentration:Good  Attention Span:Good  [place] of Knowledge:Good  Language:Other (comment) (uses sign language)  Psychomotor Activity  Psychomotor Activity:No data recorded  Assets  Assets:Communication Skills; Desire for Improvement; Financial Resources/Insurance; Housing  Sleep  Sleep:No data recorded  Physical Exam: Physical Exam Vitals and nursing note reviewed.  HENT:     Head: Normocephalic.     Ears:     Comments: Deafness    Nose: Nose normal.  Cardiovascular:     Rate and Rhythm: Normal rate.     Pulses: Normal pulses.  Pulmonary:     Effort: Pulmonary effort is normal.  Genitourinary:    Comments: Hx. BPH Musculoskeletal:        General: Normal range of motion.     Cervical back: Normal range of motion.  Skin:    General: Skin is warm and dry.  Neurological:     General: No focal deficit present.     Mental Status: He is  alert and oriented to person, place, and time.  Psychiatric:        Thought Content: Thought content normal.   Review of Systems  Constitutional:  Positive for malaise/fatigue (complains  of feeling generalized weakness due to having diarrhea multiple times last night). Negative for chills, diaphoresis and fever.  HENT:  Positive for hearing loss. Negative for congestion and sore throat.   Eyes:  Negative for blurred vision.  Respiratory:  Negative for cough, shortness of breath and wheezing.   Cardiovascular:  Negative for chest pain and palpitations.  Gastrointestinal:  Positive for diarrhea (complains of multiple bouts of diarrhea last night into the morning. Vital signs are WNL). Negative for abdominal pain, blood in stool, constipation, heartburn, melena, nausea and vomiting.  Genitourinary:  Negative for dysuria.  Musculoskeletal:  Negative for joint pain and myalgias.  Skin: Negative.   Neurological:  Positive for weakness (due to diarrhea last night). Negative for dizziness, tingling, tremors, sensory change, speech change, focal weakness, seizures, loss of consciousness and headaches.  Endo/Heme/Allergies:  Negative for environmental allergies and polydipsia. Does not bruise/bleed easily.       Allergies: NKDA  Psychiatric/Behavioral:  Positive for depression (improving with current medications). Negative for hallucinations (Hx. VH), memory loss, substance abuse and suicidal ideas. The patient is not nervous/anxious and does not have insomnia.   Blood pressure 106/69, pulse 85, temperature 97.8 F (36.6 C), temperature source Oral, resp. rate 18, height 6\' 4"  (1.93 m), weight 88.5 kg, SpO2 (!) 77 %. Body mass index is 23.74 kg/m.  Treatment Plan Summary: Daily contact with patient to assess and evaluate symptoms and progress in treatment and Medication management  Continue inpatient hospitalization.  Will continue today 07/12/2021 plan as below except where it is noted.    Diagnoses:  Major depressive disorder, recurrent episodes, severe with psychosis. HIV.  Anxiety. Continue Vistaril 25 mg po tid prn. Continue Gabapentin 200 mg po tid. Continue Propranolol 5 mg po daily for anxiety/elevated heart rate. Continue Buspar 5 mg po tid.  Depression with psychosis. Continue Sertraline 50 mg po daily. Continue Seroquel  200 mg po Q hs. Continue Seroquel 50 mg po daily for agitation.                   Insomnia. Continue Trazodone 50 mg po Q hs prn.  Diarrhea: Continue Imodium 2mg  twice a day PRN for diarrhea.   Other medical issues: continue. Biktarvy 50-200-25 mg po daily for HIV. Flomax 0.4 mg po daily for BPH.  Mylanta 30 ml po Q 4 hrs prn for indigestion. MOM 30 ml po daily prn for constipation. Acetaminophen 650 mg po Q 4 hrs prn for pain/fever.  Encourage group participation.  Discharge disposition plan is ongoing.             Ethelene Hal, NP. 07/12/2021, 12:27 PM

## 2021-07-12 NOTE — Group Note (Signed)
[facility] LCSW Group Therapy Note  07/12/2021   10:00-11:00AM  Type of Therapy and Topic:  Group Therapy:  Unhealthy versus Healthy Supports, Which Am I?  Participation Level:  Active   Description of Group:  Patients in this group were introduced to the concept that additional supports including self-support are an essential part of recovery.  Initially a discussion was held about the differences between healthy versus unhealthy supports.  Patients were asked to share what unhealthy supports in their lives need to be addressed, as well as what additional healthy supports could be added for greater help in reaching their goals.   A song entitled "My Own Hero" was played and a group discussion ensued in which patients stated they could relate to the song and it inspired them to realize they have be willing to help themselves in order to succeed, because other people cannot achieve sobriety or stability for them.  We discussed adding a variety of healthy supports to address the various needs in patient lives, including becoming more self-supportive.  A song was played called "I Know Where I've Been" toward the end of group and used to conduct an inspirational wrap-up to group of remembering how far they have already come in their journey.  Therapeutic Goals: 1)  Highlight the differences between healthy and unhealthy supports 2)  Suggest the importance of being a part of one's own support system 2)  Discuss reasons people in one's life may eventually be unable to be continually supportive  3)  Identify the patient's current support system   4) Elicit commitments to add healthy supports and to become more conscious of being self-supportive   Summary of Patient Progress:  The patient participated fully throughout group except when he had to leave to use the restroom.  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  He did not talk about his own needs for support, however, and remained focused on others rather than himself.  He received a lot of thanks when he suggested that people in the room seek out a Certified Peer Support Specialist, as most group members were not aware of such a support being available.  CSW's concern with this patient remains that he does not acknowledge his own needs in the group setting, but rather acts as a Veterinary surgeon to others. Therapeutic Modalities:   Motivational Interviewing Activity  [name] , MSW, LCSW

## 2021-07-12 NOTE — Progress Notes (Signed)
   07/12/21 2227  Psych Admission Type (Psych Patients Only)  Admission Status Voluntary  Psychosocial Assessment  Patient Complaints Other (Comment)  Eye Contact Fair  Facial Expression Flat  Affect Appropriate to circumstance  Speech Other (Comment) (Pt is hearing impaired, signs or writes questions/answers)  Interaction Minimal  Motor Activity Other (Comment) (WDL)  Appearance/Hygiene Unremarkable  Behavior Characteristics Appropriate to situation  Mood Depressed  Thought Process  Coherency WDL  Content WDL  Delusions None reported or observed  Perception WDL  Hallucination None reported or observed  Judgment Poor  Confusion None  Danger to Self  Current suicidal ideation? Denies  Self-Injurious Behavior No self-injurious ideation or behavior indicators observed or expressed   Agreement Not to Harm Self Yes  Description of Agreement Written Contract  Danger to Others  Danger to Others None reported or observed

## 2021-07-12 NOTE — [facility] Group Notes (Signed)
Adult Psychoeducational Group Not Date:  07/12/2021 Time:  0900-1045 Group Topic/Focus: PROGRESSIVE RELAXATION. A group where deep breathing is taught and tensing and relaxation muscle groups is used. Imagery is used as well.  Pts are asked to imagine 3 pillars that hold them up when they are not able to hold themselves up.  Participation Level:  Active  Participation Quality:  Appropriate  Affect:  Appropriate  Cognitive:  Oriented  Insight: Improving  Engagement in Group:  Engaged  Modes of Intervention:  Activity, Discussion, Education, and Support  Additional Comments:  Pt continues to not feel well. Did come to the group late, but when in the group, participated. Rates his energy at a 6/10 and states what holds him up is his family, his friends and his dog. Sharee Pimple, Delorse Limber

## 2021-07-12 NOTE — Progress Notes (Addendum)
   07/12/21 1400  Psych Admission Type (Psych Patients Only)  Admission Status Voluntary  Psychosocial Assessment  Patient Complaints Anxiety  Eye Contact Fair  Facial Expression Anxious  Affect Appropriate to circumstance  Speech Other (Comment) (Pt is hearing impaired)  Interaction Minimal  Motor Activity Other (Comment) (WDL)  Appearance/Hygiene Unremarkable  Behavior Characteristics Cooperative;Appropriate to situation  Thought Process  Coherency WDL  Content WDL  Delusions None reported or observed  Perception WDL  Hallucination None reported or observed  Judgment Poor  Confusion None  Danger to Self  Current suicidal ideation? Denies  Self-Injurious Behavior No self-injurious ideation or behavior indicators observed or expressed   Agreement Not to Harm Self Yes  Description of Agreement Written Contract  Danger to Others  Danger to Others None reported or observed   D. Pt presents calm and cooperative- observed out of his room more today- going to groups and interacting with interpreter. Pt reports intermittent diarrhea but has significantly improved since yesterday. Pt encouraged to continue forcing fluids due to complaints of dizziness. Per pt's self inventory, pt rated his depression, hopelessness and anxiety an 04/08/09, respectively. Pt currently denies SI/HI and AVH A. Labs and vitals monitored. Pt given and educated on medications. Pt supported emotionally and encouraged to express concerns and ask questions.   R. Pt remains safe with 15 minute checks. Will continue POC.

## 2021-07-12 NOTE — [facility] Group Notes (Signed)
Psychoeducational Group Note  Date:  07/12/2021 Time:  1300-1400   Group Topic/Focus: This is a continuation of the group from Saturday. Pt's have been asked to formulate a list of 30 positives about themselves. This list is to be read 2 times a day for 30 days, looking in a mirror. Changing patterns of negative self talk. Also discussed is the fact that there have been some people who hurt [place] in the past. We keep that memory alive within [place]. Ways to cope with this are discused   Participation Level:  Active  Participation Quality:  Appropriate  Affect:  Appropriate  Cognitive:  Oriented  Insight: Improving  Engagement in Group:  Engaged  Modes of Intervention:  Activity, Discussion, Education, and Support  Additional Comments:  Came to the group late, but when he came his interpreter was with him. Pt shows insight and awareness  Dione Housekeeper

## 2021-07-13 ENCOUNTER — Encounter (HOSPITAL_COMMUNITY): Payer: Self-pay

## 2021-07-13 NOTE — Progress Notes (Signed)
D:  Patient's self inventory sheet, patient has fair sleep, no sleep medication given.  Fair appetite, low energy level, poor concentration.  Rate depression and hopeless 8, anxiety 10.  Denied withdrawals.   SI sometimes.  Physical problems, lightheaded.  No pain medicine.  Goal is get better.   A:  Medications administered per MD orders.  Emotional support and encouragement given patient. R:  Denied SI and HI, contracts for safety.  Denied A/v hallucinations.  Safety maintained with 15 minute checks.

## 2021-07-13 NOTE — [facility] Group Notes (Signed)
Pt attended goals group and contributed 

## 2021-07-13 NOTE — Progress Notes (Signed)
Pt in stable mood. Able to voice concerns to staff appropriately by using written communication. Pt mostly stays in room. Minimal interactions with peers. States is a little anxious but has no other behavioral/mental concerns currently. Complained of having watery diarrhea and dry mouth. Pt states had more than 4 bowel movements but does not remember the exact amount.   Pt given PRN meds for diarrhea. Educated on coping skills and methods to reduce anxiety.   Pt verbalized understanding and willingness to use coping mechanisms. Pt also verbalized effectiveness of anti-diarrheal medication.

## 2021-07-13 NOTE — Group Note (Signed)
LCSW Group Therapy Note   Group Date: 07/13/2021 Start Time: 1300 End Time: 1400  Therapy Type: Group Therapy  Participation Level:  Did Not Attend   Patients received a worksheet with an outline of 2 gingerbread men with a separation in the middle of the page. One sign designated what the pt sees about themselves and the other is what others see. Pts were asked to introduce themselves and share something they like about themself. Pts were then asked to draw, write or color how they view themselves as well as how they are viewed by others. CSW led discussion about the feelings and words associated with each side.   Patient Summary:  Pt did not attend.   Felizardo Hoffmann, LCSWA 07/13/2021  1:18 PM

## 2021-07-13 NOTE — [facility] Suicide Risk Assessment (Signed)
[facility] Discharge Suicide Risk Assessment   Principal Problem: MDD (major depressive disorder), recurrent, severe, with psychosis ([place]) Discharge Diagnoses: Principal Problem:   MDD (major depressive disorder), recurrent, severe, with psychosis ([place]) Active Problems:   Suicidal thoughts   Human immunodeficiency virus infection ([place])   Panic disorder   GAD (generalized anxiety disorder)   Total Time spent with patient: 20 minutes  Patient is a 60 year old male with a psychiatric history of major depressive disorder versus bipolar disorder, generalized anxiety disorder, panic attacks, who was admitted to the psychiatric unit for evaluation for evaluation and treatment of worsening depression and suicide attempt via overdose on Tylenol PM.    During the patient's hospitalization, patient had extensive initial psychiatric evaluation, and follow-up psychiatric evaluations every day.  Psychiatric diagnoses provided upon initial assessment:  Major depressive disorder, recurrent, severe, without psychotic features Generalized anxiety disorder Panic disorder Insomnia  Patient's psychiatric medications were adjusted on admission:  Stop Lexapro 40 mg once daily Start Zoloft 25 mg once daily, for depression, anxiety, and panic attacks.  Can increase dose during hospitalization. Stop Zyprexa 10 mg nightly Start Seroquel 100 mg nightly, for mood, anxiety, and insomnia.  Patient reports he prefers Seroquel to Zyprexa, for treating the aforementioned symptoms.  Can increase dose during hospitalization Continue other medications as ordered  During the hospitalization, other adjustments were made to the patient's psychiatric medication regimen:  Zoloft was increased to current dose, at discharge which is 50 mg once daily Seroquel was titrated to XR 50 mg once daily in the morning and IR 200 mg at bedtime Cogentin 0.5 mg twice daily as needed was started for EPS, but then discontinued on admission due to  dry mouth and tardive movements of mouth and tongue.  BuSpar 5 mg 3 times daily was started for anxiety Gabapentin was increased to 200 mg 3 times daily Loperamide 2 mg was started for diarrhea Propranolol 5 mg once daily was started for anxiety and tachycardia Biotene was started for dry mouth.   Gradually, patient started adjusting to milieu.   Patient's care was discussed during the interdisciplinary team meeting every day during the hospitalization.  The patient reported having diarrhea, which improved during hospitalization.  He reported having some sedation due to the psychiatric medications and diarrhea.  He otherwise denied having side effects to prescribed other psychiatric medications.  The patient reports their target psychiatric symptoms of depression, anxiety, and suicidal thoughts, all responded well to the psychiatric medications, and the patient reports overall benefit other psychiatric hospitalization. Supportive psychotherapy was provided to the patient. The patient also participated in regular group therapy while admitted.   Labs were reviewed with the patient, and abnormal results were discussed with the patient.  The patient denied having suicidal thoughts more than 48 hours prior to discharge.  Patient denies having homicidal thoughts.  Patient denies having auditory hallucinations.  Patient denies any visual hallucinations.  Patient denies having paranoid thoughts.  The patient is able to verbalize their individual safety plan to this provider.  It is recommended to the patient to continue psychiatric medications as prescribed, after discharge from the hospital.    It is recommended to the patient to follow up with your outpatient psychiatric provider and PCP.  Discussed with the patient, the impact of alcohol, drugs, tobacco have been there overall psychiatric and medical wellbeing, and total abstinence from substance use was recommended the  patient.    Musculoskeletal: Strength & Muscle Tone: within normal limits Gait & Station: normal Patient  leans: N/A  Psychiatric Specialty Exam  Presentation  General Appearance: Appropriate for Environment; Casual; Fairly Groomed  Eye Contact:Good  Speech:-- (via interpreter)  Speech Volume:Other (comment) (uses sign language)  Handedness:Right   Mood and Affect  Mood:Euthymic  Duration of Depression Symptoms: Greater than two weeks  Affect:Appropriate; Congruent; Full Range   Thought Process  Thought Processes:Coherent; Linear  Descriptions of Associations:Intact  Orientation:Full (Time, Place and Person)  Thought Content:Logical  History of Schizophrenia/Schizoaffective disorder:No  Duration of Psychotic Symptoms:N/A  Hallucinations:Hallucinations: None Ideas of Reference:None  Suicidal Thoughts:Suicidal Thoughts: No Homicidal Thoughts:Homicidal Thoughts: No  Sensorium  Memory:Immediate Good; Recent Good; Remote Good  Judgment:Fair  Insight:Good   Executive Functions  Concentration:Good  Attention Span:Good  Recall:Good  Fund of Knowledge:Good  Language:Good   Psychomotor Activity  Psychomotor Activity:Psychomotor Activity: Extrapyramidal Side Effects (EPS) (lip smacking and tongue movements, intermittently during interview) AIMS Completed?: Yes  Assets  Assets:Communication Skills; Desire for Improvement; Financial Resources/Insurance; Housing   Sleep  Sleep:Sleep: Good  Physical Exam: Physical Exam Vitals reviewed.  Constitutional:      General: He is not in acute distress.    Appearance: He is normal weight. He is not ill-appearing or toxic-appearing.  Pulmonary:     Effort: Pulmonary effort is normal. No respiratory distress.  Neurological:     Mental Status: He is alert.     Gait: Gait normal.   Review of Systems  Constitutional:  Negative for chills and fever.  Cardiovascular:  Negative for chest pain and  palpitations.  Psychiatric/Behavioral:  Negative for depression, hallucinations, memory loss, substance abuse and suicidal ideas. The patient is nervous/anxious. The patient does not have insomnia.   All other systems reviewed and are negative.  Blood pressure 108/62, pulse 83, temperature 98.1 F (36.7 C), temperature source Oral, resp. rate 18, height 6\' 4"  (1.93 m), weight 88.5 kg, SpO2 98 %. Body mass index is 23.74 kg/m.  Mental Status Per Nursing Assessment::   On Admission:  Suicidal ideation indicated by patient  Demographic factors:  Caucasian, Living alone Loss Factors:  Loss of significant relationship Historical Factors:  Prior suicide attempts, Impulsivity Risk Reduction Factors:  Positive coping skills or problem solving skills  Continued Clinical Symptoms:  Depressive symptoms resolved.  Patient denies having suicidal thoughts  Cognitive Features That Contribute To Risk:  None    Suicide Risk:  Mild:    There are no identifiable suicide plans, no associated intent, mild dysphoria and related symptoms, good self-control (both objective and subjective assessment), few other risk factors, and identifiable protective factors, including available and accessible social support.   Follow-up Information     [place] Go to.   Specialty: Professional Counselor Why: Please go to this provider to re-establish care for therapy services during walk in hours: Monday through Friday, 9:00 am to 1:00 pm. Contact information: [address] [place] [address] [phone]         [facility] Follow up on 08/04/2021.   Specialty: Behavioral Health Why: You have an appointment on 08/04/21 at 3:00 pm for medication management services. Contact information: [address] [place] [address] [phone]                Plan Of Care/Follow-up recommendations:   Activity: as tolerated  Diet:  heart healthy  Other: -Follow-up with your outpatient psychiatric provider -instructions on appointment date, time, and address (location) are provided to you in discharge paperwork.   -Take your psychiatric  medications as prescribed at discharge - instructions are provided to you in the discharge paperwork  -Follow-up with outpatient primary care doctor and other specialists -for management of chronic medical disease and preventive medicine.  -Testing: Follow-up with outpatient provider for abnormal lab results:  Abnormal lipid panel  -Recommend abstinence from alcohol, tobacco, and other illicit drug use at discharge.   -If your psychiatric symptoms recur, worsening, or if you have side effects to your psychiatric medications, call your outpatient psychiatric provider, 911, [phone] or go to the nearest emergency department.  -If suicidal thoughts recur, call your outpatient psychiatric provider, 911, [phone] or go to the nearest emergency department.   Cristy Hilts, MD 07/14/2021, 10:34 AM

## 2021-07-13 NOTE — Group Note (Signed)
Occupational Therapy Group Note  Group Topic:Feelings Management  Group Date: 07/13/2021 Start Time: 1400 End Time: 1500 Facilitators: Donne Hazel, OT/L    Group Description: Group encouraged increased engagement and participation through discussion focused on Building Happiness. Patients were provided a handout and reviewed therapeutic strategies to build happiness including identifying gratitudes, random acts of kindness, exercise, meditation, positive journaling, and fostering relationships. Patients engaged in discussion and encouraged to reflect on each strategy and their experiences.  Therapeutic Goal(s): Identify strategies to build happiness. Identify and implement therapeutic strategies to improve overall mood. Practice and identify gratitudes, random acts of kindness, exercise, meditation, positive journaling, and fostering relationships     Participation Level: Active   Participation Quality: Independent   Behavior: Cooperative and Interactive    Speech/Thought Process: Focused   Affect/Mood: Full range   Insight: Fair   Judgement: Fair   Individualization: [place] was active in their participation of group discussion/activity. Pt identified "positive memories and being out in nature" as something that brings them happiness. Appeared attentive and engaged in discussion. Participated with the assistance of interpreter.   Modes of Intervention: Activity, Discussion, and Education  Patient Response to Interventions:  Attentive, Engaged, and Receptive   Plan: Continue to engage patient in OT groups 2 - 3x/week.  07/13/2021  Donne Hazel, OT/L

## 2021-07-13 NOTE — Progress Notes (Addendum)
[facility] MD Progress Note  07/13/2021 12:45 PM Tyler Horn  MRN:  [id number]  Subjective: (This assessment was conducted using a sign language interpreter): Tyler Horn reports, "I am doing better, I am still having diarrhea, and my blood pressure drops sometimes when I stand, but I am doing much better".   Objective: [name] is a 60 year old male who is deaf and lives alone in an apartment. 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 He has outpatient medication management services with [name], PA at [facility] and therapy with Mel Almond at [facility]. His last medication management appointment was on 10/11, his next appointment is on 12/6. Patient is known to this facility and was recently admitted in September for the same or similar presentation. Patient stated he took 100 OTC sleeping pills and drank 2 bottles of Nyquil in an effort to end his life.   Evaluation on the unit today 11/14: Daily notes: Tyler Horn is seen and chart reviewed The chart findings & treatment plan discussed with the treatment team. Patient is seen with an in person sign language interpreter during this follow-up care evaluation. He is alert and oriented to person, place, time and situation. Patient has mostly been lying in bed today, and states that he is still having diarrhea, and his blood pressure dropped earlier today when it was checked on standing, but denies any other concerns. As per [name], he received 2mg  of Imodium earlier today.   Tyler Horn's affect is flat, but he reports that his depressive symptoms are continuing to improve. He denies suicidal ideations, denies homicidal ideations, denies auditory/visual hallucinations, and denies any paranoia, and there is no evidence of any delusions. He reports a good appetite, reports being in a good mood, and states that his sleep quality last night was poor just because he had diarrhea and cramping which led to him getting just four hours of sleep. Pt reports not eating too  well because of acid reflux which is causing him to burp frequently. Patient denies having any current side effects from his medications, states that he is tolerating all of his meds well.   Tyler Horn mentioned being concerned about having involuntary mouth movements sometimes, but stated that this is something that was preexisting prior to hospitalization. Pt stated that Cogentin given to him last night helped with the involuntary mouth movements. Pt also stated that he has been using Biotin to help with dry mouth also. Pt also reported being concerned about his blood pressure dropping when he changes positions from sitting to standing. As per flowsheets, pt's BP is currently 106/58, with HR-83. Pt educated to change positions slowly, and verbalizes understanding.  Patient verbalizes being ready for discharge tomorrow (07/14/21), and states that he just found out from the sign language interpreter that there is a coffee shop for the deaf, called "[employer]", and his plan is to visit it, and see if he can make some friends who are interested in hanging out with him for dinner, etc.   Principal Problem: MDD (major depressive disorder), recurrent, severe, with psychosis (HCC)  Diagnosis: Principal Problem:   MDD (major depressive disorder), recurrent, severe, with psychosis (HCC) Active Problems:   Suicidal thoughts   Human immunodeficiency virus infection (HCC)   MDD (major depressive disorder), recurrent severe, without psychosis (HCC)  Total Time spent with patient:  25 minutes  Past Psychiatric History: See H&P  Past Medical History:  Past Medical History:  Diagnosis Date   Anxiety    Chronic  hepatitis C (HCC) 04/27/2020   Depression    HIV (human immunodeficiency virus infection) (HCC)     Past Surgical History:  Procedure Laterality Date   CATARACT EXTRACTION Left    Family History:  Family History  Problem Relation Age of Onset   Mental illness Mother    Bipolar disorder  Brother    Family Psychiatric  History: See H&P  Social History:  Social History   Substance and Sexual Activity  Alcohol Use No     Social History   Substance and Sexual Activity  Drug Use No    Social History   Socioeconomic History   Marital status: Single    Spouse name: Not on file   Number of children: Not on file   Years of education: Not on file   Highest education level: Not on file  Occupational History   Not on file  Tobacco Use   Smoking status: Never   Smokeless tobacco: Never  Vaping Use   Vaping Use: Never used  Substance and Sexual Activity   Alcohol use: No   Drug use: No   Sexual activity: Yes    Comment: pt given condoms  Other Topics Concern   Not on file  Social History Narrative   Not on file   Social Determinants of Health   Financial Resource Strain: Not on file  Food Insecurity: Not on file  Transportation Needs: Not on file  Physical Activity: Not on file  Stress: Not on file  Social Connections: Not on file   Additional Social History:   Sleep: Per patient fair sleep  Appetite:  Good  Current Medications: Current Facility-Administered Medications  Medication Dose Route Frequency Provider Last Rate Last Admin   acetaminophen (TYLENOL) tablet 650 mg  650 mg Oral Q6H PRN Nwoko, Uchenna E, PA       alum & mag hydroxide-simeth (MAALOX/MYLANTA) 200-200-20 MG/5ML suspension 30 mL  30 mL Oral Q4H PRN Nwoko, Uchenna E, PA   30 mL at 07/12/21 1905   antiseptic oral rinse (BIOTENE) solution 15 mL  15 mL Mouth Rinse PRN Laveda Abbe, NP   15 mL at 07/13/21 0958   benztropine (COGENTIN) tablet 0.5 mg  0.5 mg Oral BID PRN Laveda Abbe, NP   0.5 mg at 07/12/21 1801   bictegravir-emtricitabine-tenofovir AF (BIKTARVY) 50-200-25 MG per tablet 1 tablet  1 tablet Oral Daily Jaclyn Shaggy, PA-C   1 tablet at 07/13/21 0816   busPIRone (BUSPAR) tablet 5 mg  5 mg Oral TID Laveda Abbe, NP   5 mg at 07/13/21 1232    gabapentin (NEURONTIN) capsule 200 mg  200 mg Oral TID Armandina Stammer I, NP   200 mg at 07/13/21 0817   hydrOXYzine (ATARAX/VISTARIL) tablet 25 mg  25 mg Oral TID PRN Nwoko, Uchenna E, PA   25 mg at 07/13/21 0929   loperamide (IMODIUM) capsule 2 mg  2 mg Oral BID PRN Starleen Blue, NP   2 mg at 07/13/21 0827   magnesium hydroxide (MILK OF MAGNESIA) suspension 30 mL  30 mL Oral Daily PRN Nwoko, Uchenna E, PA   30 mL at 07/07/21 1430   propranolol (INDERAL) tablet 5 mg  5 mg Oral Daily Nwoko, Nicole Kindred I, NP   5 mg at 07/13/21 0819   QUEtiapine (SEROQUEL XR) 24 hr tablet 50 mg  50 mg Oral Daily Doreena Maulden, Harrold Donath, MD   50 mg at 07/13/21 0817   QUEtiapine (SEROQUEL) tablet 200 mg  200  mg Oral QHS Armandina Stammer I, NP   200 mg at 07/12/21 2134   sertraline (ZOLOFT) tablet 50 mg  50 mg Oral Daily Alek Poncedeleon, Harrold Donath, MD   50 mg at 07/13/21 0817   tamsulosin (FLOMAX) capsule 0.4 mg  0.4 mg Oral Daily Armandina Stammer I, NP   0.4 mg at 07/13/21 0816   traZODone (DESYREL) tablet 50 mg  50 mg Oral QHS PRN Nwoko, Uchenna E, PA   50 mg at 07/08/21 2146    Lab Results:  Results for orders placed or performed during the hospital encounter of 07/07/21 (from the past 48 hour(s))  Comprehensive metabolic panel     Status: Abnormal   Collection Time: 07/12/21  6:48 PM  Result Value Ref Range   Sodium 135 135 - 145 mmol/L   Potassium 3.6 3.5 - 5.1 mmol/L   Chloride 105 98 - 111 mmol/L   CO2 24 22 - 32 mmol/L   Glucose, Bld 104 (H) 70 - 99 mg/dL    Comment: Glucose reference range applies only to samples taken after fasting for at least 8 hours.   BUN 13 6 - 20 mg/dL   Creatinine, Ser 0.98 0.61 - 1.24 mg/dL   Calcium 9.0 8.9 - 11.9 mg/dL   Total Protein 7.3 6.5 - 8.1 g/dL   Albumin 4.0 3.5 - 5.0 g/dL   AST 16 15 - 41 U/L   ALT 12 0 - 44 U/L   Alkaline Phosphatase 83 38 - 126 U/L   Total Bilirubin 0.6 0.3 - 1.2 mg/dL   GFR, Estimated >14 >78 mL/min    Comment: (NOTE) Calculated using the CKD-EPI Creatinine  Equation (2021)    Anion gap 6 5 - 15    Comment: Performed at [facility], [address], [place], [place] [address]     Blood Alcohol level:  Lab Results  Component Value Date   [facility] <10 07/05/2021   ETH <10 07/01/2021   Metabolic Disorder Labs: Lab Results  Component Value Date   HGBA1C 5.2 07/07/2021   MPG 102.54 07/07/2021   MPG 96.8 05/06/2021   No results found for: PROLACTIN Lab Results  Component Value Date   CHOL 174 07/07/2021   TRIG 95 07/07/2021   HDL 32 (L) 07/07/2021   CHOLHDL 5.4 07/07/2021   VLDL 19 07/07/2021   LDLCALC 123 (H) 07/07/2021   LDLCALC 90 05/06/2021   Physical Findings: AIMS: Facial and Oral Movements Muscles of Facial Expression: None Lips and Perioral Area: none Jaw: none Tongue: none Extremity Movements Upper (arms, wrists, hands, fingers): None, normal Lower (legs, knees, ankles, toes): Mild, Trunk Movements Neck, shoulders, hips: None, normal, Overall Severity Severity of abnormal movements (highest score from questions above): Moderate Incapacitation due to abnormal movements: None, normal Patient's awareness of abnormal movements (rate only patient's report): Aware, no distress, Dental Status Current problems with teeth and/or dentures?: No Does patient usually wear dentures?: No  CIWA:  CIWA-Ar Total: 0 COWS:     Musculoskeletal: Strength & Muscle Tone: within normal limits Gait & Station: normal Patient leans: N/A  Psychiatric Specialty Exam:  Presentation  General Appearance: Disheveled  Eye Contact:Good  Speech: Sign language (interpretation by hospital provided sign language interpreter.  Speech Volume: Uses sign language interpreter.  Handedness:Right  Mood and Affect  Mood:Depressed (improving on medications)  Affect: Flat  Thought Process  Thought Processes:Coherent; Goal Directed  Descriptions of Associations:Intact  Orientation:Full (Time, Place and Person)  Thought  Content:Logical  History of Schizophrenia/Schizoaffective  disorder:No  Duration of Psychotic Symptoms:N/A  Hallucinations: None  Ideas of Reference:None  Suicidal Thoughts:None  Homicidal Thoughts:No data recorded  Sensorium  Memory:Immediate Good  Judgment:Fair  Insight:Good  Executive Functions  Concentration:Good  Attention Span:Good  Recall:Good  Fund of Knowledge:Good  Language:Other (comment) (uses sign language)  Psychomotor Activity  Psychomotor Activity:No data recorded  Assets  Assets:Communication Skills; Desire for Improvement; Financial Resources/Insurance; Housing  Sleep  Sleep:No data recorded  Physical Exam: Physical Exam Vitals and nursing note reviewed.  HENT:     Head: Normocephalic.     Ears:     Comments: Deafness    Nose: Nose normal.  Cardiovascular:     Rate and Rhythm: Normal rate.     Pulses: Normal pulses.  Pulmonary:     Effort: Pulmonary effort is normal.  Genitourinary:    Comments: Hx. BPH Musculoskeletal:        General: Normal range of motion.     Cervical back: Normal range of motion.  Skin:    General: Skin is warm and dry.  Neurological:     General: No focal deficit present.     Mental Status: He is alert and oriented to person, place, and time.  Psychiatric:        Thought Content: Thought content normal.   Review of Systems  Constitutional:  Positive for malaise/fatigue (complains of feeling generalized weakness due to having diarrhea multiple times last night). Negative for chills, diaphoresis and fever.  HENT:  Positive for hearing loss. Negative for congestion and sore throat.   Eyes:  Negative for blurred vision.  Respiratory:  Negative for cough, shortness of breath and wheezing.   Cardiovascular:  Negative for chest pain and palpitations.  Gastrointestinal:  Positive for diarrhea (complains of multiple bouts of diarrhea last night into the morning. Vital signs are WNL). Negative for abdominal pain,  blood in stool, constipation, heartburn, melena, nausea and vomiting.  Genitourinary:  Negative for dysuria.  Musculoskeletal:  Negative for joint pain and myalgias.  Skin: Negative.   Neurological:  Positive for weakness (due to diarrhea last night). Negative for dizziness, tingling, tremors, sensory change, speech change, focal weakness, seizures, loss of consciousness and headaches.  Endo/Heme/Allergies:  Negative for environmental allergies and polydipsia. Does not bruise/bleed easily.       Allergies: NKDA  Psychiatric/Behavioral:  Positive for depression (improving with current medications). Negative for hallucinations (Hx. VH), memory loss, substance abuse and suicidal ideas. The patient is not nervous/anxious and does not have insomnia.   Blood pressure (!) 106/58, pulse 83, temperature 98.2 F (36.8 C), temperature source Oral, resp. rate 18, height 6\' 4"  (1.93 m), weight 88.5 kg, SpO2 94 %. Body mass index is 23.74 kg/m.  Treatment Plan Summary: Daily contact with patient to assess and evaluate symptoms and progress in treatment and Medication management  Continue inpatient hospitalization.  Will continue today 07/13/2021 plan as below except where it is noted.   Diagnoses:  Major depressive disorder, recurrent episodes, severe with psychosis. HIV.  Anxiety. Continue Vistaril 25 mg po tid prn. Continue Gabapentin 200 mg po tid. Continue Propranolol 5 mg po daily for anxiety/elevated heart rate. Continue Buspar 5 mg po tid.  Depression with psychosis. Continue Sertraline 50 mg po daily. Continue Seroquel  200 mg po Q hs. Continue Seroquel 50 mg po daily for agitation.                   Insomnia. Continue Trazodone 50 mg po Q hs prn.  Diarrhea: Continue Imodium 2mg  twice a day PRN for diarrhea.    Other medical issues: continue. Biktarvy 50-200-25 mg po daily for HIV. Flomax 0.4 mg po daily for BPH.  Mylanta 30 ml po Q 4 hrs prn for indigestion. MOM 30 ml po daily  prn for constipation. Acetaminophen 650 mg po Q 4 hrs prn for pain/fever.  Encourage group participation.  Discharge disposition plan is ongoing.             Starleen Blue, NP. 07/13/2021, 12:45 PM  Patient ID: [name], male   DOB: 06/18/1961, 60 y.o.   MRN: [id number]   Total Time Spent in Direct Patient Care:  I personally spent 30 minutes on the unit in direct patient care. The direct patient care time included face-to-face time with the patient, reviewing the patient's chart, communicating with other professionals, and coordinating care. Greater than 50% of this time was spent in counseling or coordinating care with the patient regarding goals of hospitalization, psycho-education, and discharge planning needs.  I have independently evaluated the patient during a face-to-face assessment on 07/13/21. I reviewed the patient's chart, and I participated in key portions of the service. I discussed the case with the APP, and I agree with the assessment and plan of care as documented in the APP's note , as addended by me or notated below:  I saw the patient this morning.  He reports psychiatrically he is doing better.  He continues to report diarrhea, some better, responding to Imodium.   Patient is future oriented.  Reports depressive symptoms are resolving.  Denies having suicidal thoughts.  Repeat blood pressures are as follows: Laying: 128/68, heart rate 92.  Sitting 108/64, heart rate 97.  Standing 89/64, heart rate 103. Encouraged patient to sit up at the side of his bed, after lying down, before standing up.  On exam today, patient did appear to have any involuntary movements of the mouth, jaw or tongue.  He did try to recreate, but the mouth movements were over the weekend for this writer.  He reports Cogentin that he took last night helped with the abnormal movements.  Plan to continue Cogentin for this.  Discharge planning for tomorrow.   Phineas Inches, MD Psychiatrist

## 2021-07-13 NOTE — [facility] Group Notes (Signed)
[facility] Group Notes:  (Nursing/MHT/Case Management/Adjunct)  Date:  07/13/2021  Time:  9:29 PM  Type of Therapy:  Psychoeducational Skills  Participation Level:  None  Participation Quality:  Resistant  Affect:  Resistant  Cognitive:  Lacking  Insight:  None  Engagement in Group:  None  Modes of Intervention:  Education  Summary of Progress/Problems: The patient did not attend the evening A.A.meeting.   Hazle Coca S 07/13/2021, 9:29 PM

## 2021-07-13 NOTE — Progress Notes (Signed)
Patient has interpretor who stated patient felt he could not stop moving his mouth.

## 2021-07-13 NOTE — BH IP Treatment Plan (Unsigned)
Interdisciplinary Treatment and Diagnostic Plan Update  07/13/2021 Time of Session: 10:05am Tyler Horn MRN: [id number]  Principal Diagnosis: MDD (major depressive disorder), recurrent, severe, with psychosis ([place])  Secondary Diagnoses: Principal Problem:   MDD (major depressive disorder), recurrent, severe, with psychosis ([place]) Active Problems:   Suicidal thoughts   Human immunodeficiency virus infection ([place])   MDD (major depressive disorder), recurrent severe, without psychosis ([place])   Current Medications:  Current Facility-Administered Medications  Medication Dose Route Frequency Provider Last Rate Last Admin   acetaminophen (TYLENOL) tablet 650 mg  650 mg Oral Q6H PRN Nwoko, Uchenna E, PA       alum & mag hydroxide-simeth (MAALOX/MYLANTA) 200-200-20 MG/5ML suspension 30 mL  30 mL Oral Q4H PRN Nwoko, Uchenna E, PA   30 mL at 07/12/21 1905   antiseptic oral rinse (BIOTENE) solution 15 mL  15 mL Mouth Rinse PRN Ethelene Hal, NP   15 mL at 07/13/21 0958   benztropine (COGENTIN) tablet 0.5 mg  0.5 mg Oral BID PRN Ethelene Hal, NP   0.5 mg at 07/12/21 1801   bictegravir-emtricitabine-tenofovir AF (BIKTARVY) 50-200-25 MG per tablet 1 tablet  1 tablet Oral Daily Prescilla Sours, PA-C   1 tablet at 07/13/21 0816   busPIRone (BUSPAR) tablet 5 mg  5 mg Oral TID Ethelene Hal, NP   5 mg at 07/13/21 0816   gabapentin (NEURONTIN) capsule 200 mg  200 mg Oral TID Lindell Spar I, NP   200 mg at 07/13/21 0817   hydrOXYzine (ATARAX/VISTARIL) tablet 25 mg  25 mg Oral TID PRN Nwoko, Uchenna E, PA   25 mg at 07/13/21 [id number]   loperamide (IMODIUM) capsule 2 mg  2 mg Oral BID PRN Nicholes Rough, NP   2 mg at 07/13/21 0827   magnesium hydroxide (MILK OF MAGNESIA) suspension 30 mL  30 mL Oral Daily PRN Nwoko, Uchenna E, PA   30 mL at 07/07/21 1430   propranolol (INDERAL) tablet 5 mg  5 mg Oral Daily Nwoko, Herbert Pun I, NP   5 mg at 07/13/21 0819   QUEtiapine (SEROQUEL XR) 24 hr tablet  50 mg  50 mg Oral Daily Massengill, Ovid Curd, MD   50 mg at 07/13/21 0817   QUEtiapine (SEROQUEL) tablet 200 mg  200 mg Oral QHS Nwoko, Herbert Pun I, NP   200 mg at 07/12/21 2134   sertraline (ZOLOFT) tablet 50 mg  50 mg Oral Daily Massengill, Ovid Curd, MD   50 mg at 07/13/21 0817   tamsulosin (FLOMAX) capsule 0.4 mg  0.4 mg Oral Daily Lindell Spar I, NP   0.4 mg at 07/13/21 0816   traZODone (DESYREL) tablet 50 mg  50 mg Oral QHS PRN Nwoko, Uchenna E, PA   50 mg at 07/08/21 2146   PTA Medications: Medications Prior to Admission  Medication Sig Dispense Refill Last Dose   bictegravir-emtricitabine-tenofovir AF (BIKTARVY) 50-200-25 MG TABS tablet Take 1 tablet by mouth daily. 30 tablet 5    Cholecalciferol (VITAMIN D3 PO) Take 4 capsules by mouth every morning.      escitalopram (LEXAPRO) 20 MG tablet Take 1 tablet (20 mg total) by mouth daily. Patient to take 1 tablet (20 mg total) for 6 days followed by 2 tablets (40 mg total) daily (Patient taking differently: Take 40 mg by mouth every morning.) 30 tablet 1    gabapentin (NEURONTIN) 100 MG capsule Take 1 capsule (100 mg total) by mouth 3 (three) times daily. (Patient not taking: Reported on 07/05/2021) 3 capsule  0    LORazepam (ATIVAN) 1 MG tablet Take 1 tablet (1 mg total) by mouth 3 (three) times daily as needed for anxiety. (Patient not taking: No sig reported) 3 tablet 0    naproxen sodium (ALEVE) 220 MG tablet Take 220 mg by mouth 2 (two) times daily as needed (headache).      OLANZapine (ZYPREXA) 10 MG tablet Take 1 tablet (10 mg total) by mouth at bedtime. 30 tablet 1    Omega-3 Fatty Acids (FISH OIL PO) Take 1 capsule by mouth every morning.      OVER THE COUNTER MEDICATION Take 25-50 mg by mouth at bedtime as needed (sleep). Unisom - doxylamine or diphenhydramine      Pseudoeph-Doxylamine-DM-APAP (NYQUIL PO) Take 2 tablets by mouth at bedtime.      tamsulosin (FLOMAX) 0.4 MG CAPS capsule Take 0.4 mg by mouth daily. (Patient not taking: No sig  reported)       Patient Stressors: Health problems   Medication change or noncompliance    Patient Strengths: Ability for insight  Average or above average intelligence  Communication skills  Motivation for treatment/growth   Treatment Modalities: Medication Management, Group therapy, Case management,  1 to 1 session with clinician, Psychoeducation, Recreational therapy.   Physician Treatment Plan for Primary Diagnosis: MDD (major depressive disorder), recurrent, severe, with psychosis ([place]) Long Term Goal(s): Improvement in symptoms so as ready for discharge   Short Term Goals: Ability to identify changes in lifestyle to reduce recurrence of condition will improve Ability to verbalize feelings will improve Ability to disclose and discuss suicidal ideas Ability to demonstrate self-control will improve Ability to maintain clinical measurements within normal limits will improve Ability to identify triggers associated with substance abuse/mental health issues will improve Ability to identify and develop effective coping behaviors will improve Compliance with prescribed medications will improve  Medication Management: Evaluate patient's response, side effects, and tolerance of medication regimen.  Therapeutic Interventions: 1 to 1 sessions, Unit Group sessions and Medication administration.  Evaluation of Outcomes: Progressing  Physician Treatment Plan for Secondary Diagnosis: Principal Problem:   MDD (major depressive disorder), recurrent, severe, with psychosis ([place]) Active Problems:   Suicidal thoughts   Human immunodeficiency virus infection ([place])   MDD (major depressive disorder), recurrent severe, without psychosis ([place])  Long Term Goal(s): Improvement in symptoms so as ready for discharge   Short Term Goals: Ability to identify changes in lifestyle to reduce recurrence of condition will improve Ability to verbalize feelings will improve Ability to disclose and discuss  suicidal ideas Ability to demonstrate self-control will improve Ability to maintain clinical measurements within normal limits will improve Ability to identify triggers associated with substance abuse/mental health issues will improve Ability to identify and develop effective coping behaviors will improve Compliance with prescribed medications will improve     Medication Management: Evaluate patient's response, side effects, and tolerance of medication regimen.  Therapeutic Interventions: 1 to 1 sessions, Unit Group sessions and Medication administration.  Evaluation of Outcomes: Progressing   RN Treatment Plan for Primary Diagnosis: MDD (major depressive disorder), recurrent, severe, with psychosis ([place]) Long Term Goal(s): Knowledge of disease and therapeutic regimen to maintain health will improve  Short Term Goals: Ability to remain free from injury will improve, Ability to verbalize frustration and anger appropriately will improve, Ability to demonstrate self-control, Ability to identify and develop effective coping behaviors will improve, and Compliance with prescribed medications will improve  Medication Management: RN will administer medications as ordered by provider, will assess  and evaluate patient's response and provide education to patient for prescribed medication. RN will report any adverse and/or side effects to prescribing provider.  Therapeutic Interventions: 1 on 1 counseling sessions, Psychoeducation, Medication administration, Evaluate responses to treatment, Monitor vital signs and CBGs as ordered, Perform/monitor CIWA, COWS, AIMS and Fall Risk screenings as ordered, Perform wound care treatments as ordered.  Evaluation of Outcomes: Progressing   LCSW Treatment Plan for Primary Diagnosis: MDD (major depressive disorder), recurrent, severe, with psychosis ([facility]) Long Term Goal(s): Safe transition to appropriate next level of care at discharge, Engage patient in  therapeutic group addressing interpersonal concerns.  Short Term Goals: Engage patient in aftercare planning with referrals and resources, Increase social support, Identify triggers associated with mental health/substance abuse issues, and Increase skills for wellness and recovery  Therapeutic Interventions: Assess for all discharge needs, 1 to 1 time with Social worker, Explore available resources and support systems, Assess for adequacy in community support network, Educate family and significant other(s) on suicide prevention, Complete Psychosocial Assessment, Interpersonal group therapy.  Evaluation of Outcomes: Progressing   Progress in Treatment: Attending groups: Yes. Participating in groups: Yes. Taking medication as prescribed: Yes. Toleration medication: Yes. Family/Significant other contact made: Yes, individual(s) contacted:  Mother  Patient understands diagnosis: Yes. Discussing patient identified problems/goals with staff: Yes. Medical problems stabilized or resolved: Yes. Denies suicidal/homicidal ideation: Yes. Issues/concerns per patient self-inventory: No.     New problem(s) identified: No, Describe:  None    New Short Term/Long Term Goal(s): medication stabilization, elimination of SI thoughts, development of comprehensive mental wellness plan.    Patient Goals: "To be stable and to get on the right medications"    Discharge Plan or Barriers: Patient is to return home and follow up with established providers.   Reason for Continuation of Hospitalization: Depression Anxiety Medication stabilization    Estimated Length of Stay: 1-3 day   Scribe for Treatment Team: Otelia Santee, LCSW 07/13/2021 10:29 AM

## 2021-07-13 NOTE — Group Note (Signed)
Recreation Therapy Group Note   Group Topic:Stress Management  Group Date: 07/13/2021 Start Time: 0930 End Time: 0945 Facilitators: Caroll Rancher, [place] Location: [address] Dayroom  Goal Area(s) Addresses:  Patient will identify positive stress management techniques. Patient will identify benefits of using stress management post d/c.  Group Description:  Meditation.  LRT played a meditation that focused on self trust and how we sometimes doubt ourselves due to past mistakes and what it takes to regain that confidence/trust in ones self.  Patients were to listen, follow along and let their breathing relax each person as much as possible.    Affect/Mood: Appropriate   Participation Level: Minimal   Participation Quality: Independent   Behavior: Appropriate and Distracted   Speech/Thought Process: Distracted   Insight: Lacking   Judgement: Lacking    Modes of Intervention: Meditation   Patient Response to Interventions:  Attentive   Education Outcome:  Acknowledges education and In group clarification offered    Clinical Observations/Individualized Feedback: Pt came into group late with interpreter.  Pt gave minimal participation with the help of interpreter.  Pt then began focusing on things going on in the hallway.  Pt left early and did not return.    Plan: Continue to engage patient in RT group sessions 2-3x/week.   Caroll Rancher, LRT,CTRS 07/13/2021 11:43 AM

## 2021-07-14 DIAGNOSIS — F411 Generalized anxiety disorder: Secondary | ICD-10-CM

## 2021-07-14 MED ORDER — PROPRANOLOL HCL 10 MG PO TABS: 5.0000 mg | ORAL_TABLET | Freq: Every day | ORAL | 0 refills | Status: DC

## 2021-07-14 MED ORDER — BUSPIRONE HCL 5 MG PO TABS: 5.0000 mg | ORAL_TABLET | Freq: Three times a day (TID) | ORAL | 0 refills | Status: DC

## 2021-07-14 MED ORDER — QUETIAPINE FUMARATE 200 MG PO TABS: 200.0000 mg | ORAL_TABLET | Freq: Every day | ORAL | 0 refills | Status: DC

## 2021-07-14 MED ORDER — QUETIAPINE FUMARATE ER 50 MG PO TB24: 50.0000 mg | ORAL_TABLET | Freq: Every day | ORAL | 0 refills | Status: DC

## 2021-07-14 MED ORDER — SERTRALINE HCL 50 MG PO TABS
50.0000 mg | ORAL_TABLET | Freq: Every day | ORAL | 0 refills | Status: DC
Start: 2021-07-15 — End: 2021-08-04

## 2021-07-14 MED ORDER — TAMSULOSIN HCL 0.4 MG PO CAPS: 0.4000 mg | ORAL_CAPSULE | Freq: Every day | ORAL | 0 refills | Status: DC

## 2021-07-14 MED ORDER — BIOTENE DRY MOUTH MT LIQD: 15.0000 mL | OROMUCOSAL | 0 refills | Status: DC | PRN

## 2021-07-14 MED ORDER — GABAPENTIN 100 MG PO CAPS: 200.0000 mg | ORAL_CAPSULE | Freq: Three times a day (TID) | ORAL | 0 refills | Status: DC

## 2021-07-14 NOTE — Plan of Care (Signed)
Nurse discussed anxiety, depression and coping skills with patient.  

## 2021-07-14 NOTE — Progress Notes (Signed)
  [facility] Adult Case Management Discharge Plan :  Will you be returning to the same living situation after discharge:  Yes,  personal home At discharge, do you have transportation home?: Yes,  Safe Transport to personal car at [facility] you have the ability to pay for your medications: Yes,  personal income  Release of information consent forms completed and sent to medical records;  Patient's signature obtained.  Patient to Follow up at:  Follow-up Information     [place] Go to.   Specialty: Professional Counselor Why: Please go to this provider to re-establish care for therapy services during walk in hours: Monday through Friday, 9:00 am to 1:00 pm. Contact information: [address] [place] [address] [phone]         [facility] Follow up on 08/04/2021.   Specialty: Behavioral Health Why: You have an appointment on 08/04/21 at 3:00 pm for medication management services. Contact information: [phone] [place] [address] [phone]                Next level of care provider has access to [facility] Link:yes  Safety Planning and Suicide Prevention discussed: Yes,  w/ pt     Has patient been referred to the Quitline?: N/A patient is not a smoker  Patient has been referred for addiction treatment: N/A  Felizardo Hoffmann, Theresia Majors 07/14/2021, 9:56 AM

## 2021-07-14 NOTE — Progress Notes (Signed)
Discharge Note:  Patient discharged via taxi to pick up his car at [facility].  Suicide prevention information given and discussed with patient who stated he understood and had no questions.  Patient denied SI and HI, contracts for safety.   Denied A/V hallucinations.  Patient stated he received all his belongings, clothing, toiletries, misc items, etc.  All required discharge information given to patient.

## 2021-07-14 NOTE — Discharge Summary (Signed)
Physician Discharge Summary Note  Patient:  Tyler Horn is an 60 y.o., male MRN:  [id number] DOB:  04/12/1961 Patient phone:  [phone] (home)  Patient address:   [address] [place] [address],  Total Time spent with patient: 30 minutes  Date of Admission:  07/07/2021 Date of Discharge: 07/14/2021  Reason for Admission:  (From MD's admission note): Patient was seen with Dr Sherron Flemings, CSW Denny Peon and ASL interpreter. This represents one of several [facility] inpatient admissions for this patient. Tyler Horn is a 60 year old male who is deaf and lives alone in an apartment. 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 He has outpatient medication management services with [name], PA at [facility] and therapy with Mel Almond at [facility]. His last medication management appointment was on 10/11, his next appointment is on 12/6. Patient is known to this facility and was recently admitted in September for the same or similar presentation. Patient stated he took 100 OTC sleeping pills and drank 2 bottles of Nyquil in an effort to end his life. He stated he could not get his anxiety medications and his next medication management appointment is too far away. He last saw his medication management provider on 10/11 and his next appointment is 12/6. During this appointment his Lexapro was increased, he was provided with a prescription for 30 day supply with 1 refill and a 30 day with 1 refill prescription for olanzapine. He stated to this provider that he ran out of his medications, he later told the attending psychiatrist that he was not taking it correctly due to his racing thoughts. When questioned about what medications he currently takes, he stated he is taking olanzapine and Ativan. He was reminded that he is also taking Lexapro. When questioned about an active prescription for Ativan from his outpatient provider he stated "they give me that when I am in the hospital, they told me they  won't give it to me out there." Patient was informed that we use Vistaril for anxiety and we will not be prescribing Ativan during this hospitalization. Of note, patient has an FYI in his chart requesting he not be given benzodiazepines when he presents to the emergency room. Patient has had 7 emergency room visits since September, each time requesting Ativan. Patient appears to have secondary gain by seeking benzodiazepines in the emergency room or the inpatient hospital.    Patient lives alone and stated he often feels sad because nobody loves or cherishes him. He stated he feels hopeless, helpless, sad, lonely and anxious. He listed his goal for treatment is to get his medications right. He is agreeable to switching his medications. He stated he has been on Lexapro for 10 years. We will switch to Zoloft for his anxiety and depression and discontinue olanzapine and restart Seroquel 100 mg and titrate up for effectiveness. He stated he has better mood control and sleep on Seroquel. He endorses good sleep and a good appetite. He denies HI/AVH, paranoia and delusions. He endorses suicidal ideation but currently has no plan and agrees to keep himself safe in the hospital. He does not appear to be responding to internal stimuli.    Patient denies allergies to medications. He denies medical problems with the exception of HIV, which he takes medication for and is followed by [facility]  Principal Problem: MDD (major depressive disorder), recurrent, severe, with psychosis (HCC) Discharge Diagnoses: Principal Problem:   MDD (major depressive disorder), recurrent, severe, with psychosis (HCC) Active  Problems:   Suicidal thoughts   Human immunodeficiency virus infection (HCC)   Panic disorder   GAD (generalized anxiety disorder)   Past Psychiatric History: Major depressive disorder with psychosis  Past Medical History:  Past Medical History:  Diagnosis Date   Anxiety    Chronic hepatitis C (HCC) 04/27/2020    Depression    HIV (human immunodeficiency virus infection) (HCC)     Past Surgical History:  Procedure Laterality Date   CATARACT EXTRACTION Left    Family History:  Family History  Problem Relation Age of Onset   Mental illness Mother    Bipolar disorder Brother    Family Psychiatric  History: Denies knowing of any family history Social History:  Social History   Substance and Sexual Activity  Alcohol Use No     Social History   Substance and Sexual Activity  Drug Use No    Social History   Socioeconomic History   Marital status: Single    Spouse name: Not on file   Number of children: Not on file   Years of education: Not on file   Highest education level: Not on file  Occupational History   Not on file  Tobacco Use   Smoking status: Never   Smokeless tobacco: Never  Vaping Use   Vaping Use: Never used  Substance and Sexual Activity   Alcohol use: No   Drug use: No   Sexual activity: Yes    Comment: pt given condoms  Other Topics Concern   Not on file  Social History Narrative   Not on file   Social Determinants of Health   Financial Resource Strain: Not on file  Food Insecurity: Not on file  Transportation Needs: Not on file  Physical Activity: Not on file  Stress: Not on file  Social Connections: Not on file    Hospital Course:  After the above admission evaluation, Kentrail's presenting symptoms were noted. He was recommended for mood stabilization treatments. The medication regimen targeting those presenting symptoms were discussed with him & initiated with his consent. His medications were changed during this hospitalization. His Lexapro and Zyprexa were stopped. He was restarted on Seroquel XR 50 mg daily and Seroquel 200 mg at bedtime, Buspar 5 mg TID, and Zoloft 50 mg daily for depression and anxiety. The medications were titrated during his hospitalization to address his symptoms. He was also started on Gabapentin to augment his anxiety  medications. His UDS on arrival to the ED was positive for benzodiazepines, given in the emergency room, BAL was negative. He was medicated, stabilized & discharged on the medications listed on his discharge medication list below. Besides the mood stabilization treatments, Kadyn was also enrolled & participated in the group counseling sessions being offered & held on this unit. He learned coping skills. He presented no other significant pre-existing medical issues that required treatment. He has been experiencing some lip smacking and mouth movements that could indicate some underlying TD. He was given on Cogentin 0.5 mg BID PRN for this, which did not seem to help and gave him dry mouth. The Cogentin was discontinued at discharge and Khiry was instructed to talk to his outpatient provider about his symptoms. Brandun stated his mouth movements are more prominent during the day and do not bother him at night. He was given Biotene mouthwash for his dry mouth. He tolerated his treatment regimen without any adverse effects or reactions reported.   During the course of his hospitalization, the 15-minute checks were  adequate to ensure patient's safety. Oneal did not display any dangerous, violent or suicidal behavior on the unit.  He interacted with patients & staff appropriately, participated appropriately in the group sessions/therapies. His medications were addressed & adjusted to meet his needs. He was recommended for outpatient follow-up care & medication management upon discharge to assure continuity of care & mood stability.  At the time of discharge patient is not reporting any acute suicidal/homicidal ideations. He feels more confident about his self-care & in managing his mental health. He currently denies any new issues or concerns. Education and supportive counseling provided throughout his hospital stay & upon discharge.   Today upon his discharge evaluation with the attending psychiatrist, Hervie  shares he is doing well. His evaluation was performed with video ASL interpreter [phone], Belenda Cruise. He denies any other specific concerns. He is sleeping well. His appetite is good. His diarrhea episodes have improved and are minimal at discharge. He denies other physical complaints. He denies AH/VH, delusional thoughts or paranoia. He does not appear to be responding to any internal stimuli. He feels that his medications have been helpful & is in agreement to continue his current treatment regimen as recommended. He was able to engage in safety planning including plan to return to [facility] or contact emergency services if he feels unable to maintain his own safety or the safety of others. Pt had no further questions, comments, or concerns. He left [facility] with all personal belongings in no apparent distress. Transportation to his car, which is parked at the hospital, via [employer].    Physical Findings: AIMS: Facial and Oral Movements Muscles of Facial Expression: Mild Lips and Perioral Area: Moderate Jaw: Minimal Tongue: Minimal,Extremity Movements Upper (arms, wrists, hands, fingers): None, normal Lower (legs, knees, ankles, toes): Mild, Trunk Movements Neck, shoulders, hips: None, normal, Overall Severity Severity of abnormal movements (highest score from questions above): Moderate Incapacitation due to abnormal movements: None, normal Patient's awareness of abnormal movements (rate only patient's report): Aware, no distress, Dental Status Current problems with teeth and/or dentures?: No Does patient usually wear dentures?: No  CIWA:  CIWA-Ar Total: 0 COWS:     Musculoskeletal: Strength & Muscle Tone: within normal limits Gait & Station: normal Patient leans: N/A  Psychiatric Specialty Exam:  Presentation  General Appearance: Appropriate for Environment; Casual; Fairly Groomed  Eye Contact:Good  Speech:-- (via interpreter)  Speech Volume:Other (comment) (uses sign  language)  Handedness:Right  Mood and Affect  Mood:Euthymic  Affect:Appropriate; Congruent; Full Range  Thought Process  Thought Processes:Coherent; Linear  Descriptions of Associations:Intact  Orientation:Full (Time, Place and Person)  Thought Content:Logical  History of Schizophrenia/Schizoaffective disorder:No  Duration of Psychotic Symptoms:N/A  Hallucinations:Hallucinations: None  Ideas of Reference:None  Suicidal Thoughts:Suicidal Thoughts: No  Homicidal Thoughts:Homicidal Thoughts: No  Sensorium  Memory:Immediate Good; Recent Good; Remote Good  Judgment:Fair  Insight:Good  Executive Functions  Concentration:Good  Attention Span:Good  Recall:Good  Fund of Knowledge:Good  Language:Good  Psychomotor Activity  Psychomotor Activity:Psychomotor Activity: Extrapyramidal Side Effects (EPS) (lip smacking and tongue movements, intermittently during interview) AIMS Completed?: Yes  Assets  Assets:Communication Skills; Desire for Improvement; Financial Resources/Insurance; Housing  Sleep  Sleep:Sleep: Good  Physical Exam: Physical Exam Vitals and nursing note reviewed.  Constitutional:      Appearance: Normal appearance.  HENT:     Head: Normocephalic.  Pulmonary:     Effort: Pulmonary effort is normal.  Musculoskeletal:     Cervical back: Normal range of motion.  Neurological:     General:  No focal deficit present.     Mental Status: He is alert and oriented to person, place, and time.  Psychiatric:        Attention and Perception: Attention and perception normal. He does not perceive auditory or visual hallucinations.        Mood and Affect: Mood and affect normal.        Speech: Speech normal.        Behavior: Behavior normal. Behavior is cooperative.        Thought Content: Thought content is not paranoid or delusional. Thought content does not include homicidal or suicidal ideation. Thought content does not include homicidal or suicidal  plan.        Cognition and Memory: Cognition normal.   Review of Systems  Constitutional:  Negative for fever.  Respiratory:  Negative for cough and shortness of breath.   Cardiovascular:  Negative for chest pain.  Gastrointestinal: Negative.   Genitourinary: Negative.   Musculoskeletal: Negative.    Blood pressure 108/62, pulse 83, temperature 98.1 F (36.7 C), temperature source Oral, resp. rate 18, height 6\' 4"  (1.93 m), weight 88.5 kg, SpO2 98 %. Body mass index is 23.74 kg/m.   Social History   Tobacco Use  Smoking Status Never  Smokeless Tobacco Never   Tobacco Cessation:  N/A, patient does not currently use tobacco products   Blood Alcohol level:  Lab Results  Component Value Date   ETH <10 07/05/2021   ETH <10 07/01/2021    Metabolic Disorder Labs:  Lab Results  Component Value Date   HGBA1C 5.2 07/07/2021   MPG 102.54 07/07/2021   MPG 96.8 05/06/2021   No results found for: PROLACTIN Lab Results  Component Value Date   CHOL 174 07/07/2021   TRIG 95 07/07/2021   HDL 32 (L) 07/07/2021   CHOLHDL 5.4 07/07/2021   VLDL 19 07/07/2021   LDLCALC 123 (H) 07/07/2021   LDLCALC 90 05/06/2021    See Psychiatric Specialty Exam and Suicide Risk Assessment completed by Attending Physician prior to discharge.  Discharge destination:  Home  Is patient on multiple antipsychotic therapies at discharge:  No   Has Patient had three or more failed trials of antipsychotic monotherapy by history:  No  Recommended Plan for Multiple Antipsychotic Therapies: NA  Discharge Instructions     Diet - low sodium heart healthy   Complete by: As directed    Increase activity slowly   Complete by: As directed       Allergies as of 07/14/2021   No Known Allergies      Medication List     STOP taking these medications    escitalopram 20 MG tablet Commonly known as: Lexapro   FISH OIL PO   LORazepam 1 MG tablet Commonly known as: Ativan   naproxen sodium 220  MG tablet Commonly known as: ALEVE   NYQUIL PO   OLANZapine 10 MG tablet Commonly known as: ZyPREXA   OVER THE COUNTER MEDICATION   VITAMIN D3 PO       TAKE these medications      Indication  antiseptic oral rinse Liqd 15 mLs by Mouth Rinse route as needed for dry mouth.  Indication: dry mouth   Biktarvy 50-200-25 MG Tabs tablet Generic drug: bictegravir-emtricitabine-tenofovir AF Take 1 tablet by mouth daily.  Indication: HIV Disease   busPIRone 5 MG tablet Commonly known as: BUSPAR Take 1 tablet (5 mg total) by mouth 3 (three) times daily.  Indication: Anxiety Disorder  gabapentin 100 MG capsule Commonly known as: NEURONTIN Take 2 capsules (200 mg total) by mouth 3 (three) times daily. What changed: how much to take  Indication: Neuropathic Pain, Anxiety   propranolol 10 MG tablet Commonly known as: INDERAL Take 0.5 tablets (5 mg total) by mouth daily. Start taking on: July 15, 2021  Indication: Feeling Anxious   QUEtiapine 200 MG tablet Commonly known as: SEROQUEL Take 1 tablet (200 mg total) by mouth at bedtime.  Indication: Generalized Anxiety Disorder, Major Depressive Disorder, Mood control   QUEtiapine 50 MG Tb24 24 hr tablet Commonly known as: SEROQUEL XR Take 1 tablet (50 mg total) by mouth daily. Start taking on: July 15, 2021  Indication: Generalized Anxiety Disorder, Major Depressive Disorder   sertraline 50 MG tablet Commonly known as: ZOLOFT Take 1 tablet (50 mg total) by mouth daily. Start taking on: July 15, 2021  Indication: Generalized Anxiety Disorder, Major Depressive Disorder   tamsulosin 0.4 MG Caps capsule Commonly known as: FLOMAX Take 1 capsule (0.4 mg total) by mouth daily.  Indication: Benign Enlargement of Prostate        Follow-up Information     [facility] Go to.   Specialty: Professional Counselor Why: Please go to this provider to re-establish care for therapy services during  walk in hours: Monday through Friday, 9:00 am to 1:00 pm. Contact information: [address] [place] [address] [phone]         [facility] Follow up on 08/04/2021.   Specialty: Behavioral Health Why: You have an appointment on 08/04/21 at 3:00 pm for medication management services. Contact information: [address] [place] [address] [phone]                Follow-up recommendations:  Activity:  as tolerated Diet:  Heart Healthy  Comments:  Prescriptions were given at discharge.  Patient is agreeable with the discharge plan.  He was given an opportunity to ask questions.  He appears to feel comfortable with discharge and denies any current suicidal or homicidal thoughts. Patient is instructed prior to discharge to: Take all medications as prescribed by his mental healthcare provider. Report any adverse effects and or reactions from the medicines to his outpatient provider promptly. Patient has been instructed & cautioned: To not engage in alcohol and or illegal drug use while on prescription medicines. In the event of worsening symptoms, patient is instructed to call the crisis hotline, 911 and or go to the nearest ED for appropriate evaluation and treatment of symptoms. To follow-up with his primary care provider for your other medical issues, concerns and or health care needs.   Signed: Laveda Abbe, NP 07/14/2021, 11:47 AM

## 2021-07-14 NOTE — Discharge Instructions (Addendum)
Take your psychiatric medications as follows:   Seroquel XR 50 mg once daily in the morning Zoloft 50 mg once daily in the morning Propranolol 5 mg in the morning  Buspar 5 mg three times daily, in the morning, around 12 PM (lunch) and again around 5 pm  Gabapentin 200 mg three times a day, in the morning, around 2 PM and at bedtime  Seroquel 200 mg at bedtime  Take your medical medications as directed by your PCP

## 2021-07-14 NOTE — [facility] Group Notes (Signed)
The focus of this group is to help patients establish daily goals to achieve during treatment and discuss how the patient can incorporate goal setting into their daily lives to aide in recovery.  Pt did not attend morning goals group.  

## 2021-07-14 NOTE — Progress Notes (Signed)
D:  Patient's self inventory sheet:  Patient sleeps good, no sleep medication.  Good appetite, low energy level, poor concentration.  Rated depression 7, hopeless 8, anxiety 5.  Denied withdrawals.  Denied SI.  Physical problems, lightheaded, nausea.  Denied physical pain.  Goal is discharge.  Trying to cope with anxiety.  Does have discharge plans. A:  Medications administered per MD orders.  Emotional support and encouragement given patient. R:  Denied SI and HI, contracts for safety.  Denied A/V hallucinations.  Safety maintained with 15 minute checks.

## 2021-07-16 ENCOUNTER — Other Ambulatory Visit (HOSPITAL_COMMUNITY): Payer: Self-pay | Admitting: Psychiatry

## 2021-07-19 NOTE — B[facility] Group Notes (Signed)
Spiritual care group on grief and loss facilitated by chaplain Dyanne Carrel, [facility]   Group Goal:   Support / Education around grief and loss   Members engage in facilitated group support and psycho-social education.   Group Description:   Following introductions and group rules, group members engaged in facilitated group dialog and support around topic of loss, with particular support around experiences of loss in their lives. Group Identified types of loss (relationships / self / things) and identified patterns, circumstances, and changes that precipitate losses. Reflected on thoughts / feelings around loss, normalized grief responses, and recognized variety in grief experience. Group noted Worden's four tasks of grief in discussion.   Group drew on Adlerian / Rogerian, narrative, MI,   Patient Progress: Hurshel was present at the beginning of group, but left towards the beginning.  He had participated in sharing and in the psycho-social education around grief during his last admission.  [employer], B[facility] Pager, [phone] 4:02 PM

## 2021-07-20 NOTE — Telephone Encounter (Signed)
Patient was prescribed 30 day supply when discharged from inpatient hospital. 90 day supply of medications would need to be provided by patient's outpatient hospital.

## 2021-08-04 ENCOUNTER — Ambulatory Visit (INDEPENDENT_AMBULATORY_CARE_PROVIDER_SITE_OTHER): Payer: No Payment, Other | Admitting: Physician Assistant

## 2021-08-04 ENCOUNTER — Other Ambulatory Visit: Payer: Self-pay

## 2021-08-04 ENCOUNTER — Encounter (HOSPITAL_COMMUNITY): Payer: Self-pay | Admitting: Physician Assistant

## 2021-08-04 VITALS — BP 132/79 | HR 109 | Ht 76.0 in | Wt 208.0 lb

## 2021-08-04 DIAGNOSIS — F331 Major depressive disorder, recurrent, moderate: Secondary | ICD-10-CM | POA: Diagnosis not present

## 2021-08-04 DIAGNOSIS — F410 Panic disorder [episodic paroxysmal anxiety] without agoraphobia: Secondary | ICD-10-CM | POA: Diagnosis not present

## 2021-08-04 DIAGNOSIS — F411 Generalized anxiety disorder: Secondary | ICD-10-CM | POA: Diagnosis not present

## 2021-08-04 DIAGNOSIS — F99 Mental disorder, not otherwise specified: Secondary | ICD-10-CM

## 2021-08-04 DIAGNOSIS — F5105 Insomnia due to other mental disorder: Secondary | ICD-10-CM | POA: Diagnosis not present

## 2021-08-04 MED ORDER — ESCITALOPRAM OXALATE 10 MG PO TABS: 10.0000 mg | ORAL_TABLET | Freq: Every day | ORAL | 1 refills | Status: DC

## 2021-08-04 MED ORDER — QUETIAPINE FUMARATE 300 MG PO TABS: 300.0000 mg | ORAL_TABLET | Freq: Every day | ORAL | 1 refills | Status: DC

## 2021-08-04 MED ORDER — PROPRANOLOL HCL 10 MG PO TABS: 5.0000 mg | ORAL_TABLET | Freq: Every day | ORAL | 1 refills | Status: DC

## 2021-08-04 NOTE — Progress Notes (Addendum)
BH MD/PA/NP OP Progress Note  08/04/2021 7:32 PM Tyler Horn  MRN:  [id number]  Chief Complaint:  Chief Complaint   Medication Management    HPI:   Tyler Horn is a 60 year old male with a past psychiatric history significant for insomnia, major depressive disorder with psychotic features, panic attacks, and generalized anxiety disorder who presents to [facility] behavioral health outpatient clinic for follow-up and medication management.  Language interpretive services were utilized during the encounter due to patient's use of sign language to communicate.  Patient was recently admitted to [facility] and charged on the following medications:  Buspirone 5 mg 3 times daily Gabapentin 200 mg 3 times daily Propranolol 5 mg daily Seroquel 200 mg at bedtime Seroquel XR 50 mg to TB24 24-hour tablet daily Sertraline 50 mg daily  Since being discharged from [facility], patient reports that he is feeling better, however, he does acknowledge that he has not fully recovered since leaving [facility].  He states that he continues to have issues with insomnia stating that he lays awake in bed and is often active during the night.  He reports that he has not slept well in the past several days.  He does endorse that his anxiety has been well managed through his use of gabapentin, buspirone, and Seroquel.  He reports that he has also ran out of his propranolol, another medication that has also been prescribed for the management of his anxiety.  Patient reports that his depression has been minimal and he rates his depression a 2 or 3 out of 10.  Patient rates his anxiety a 1 out of 10 and states that his medications have been very helpful.  He does express that he has been having side effects from his sertraline use mainly consisting of diarrhea.  Patient's main stressor involves his mother being upset with him for missing an appointment with a lawyer.  Patient states that after the holidays, he will  be looking for a job with the help of a vocational rehabilitation program.  Patient reports that he has been becoming more active in the deaf community.  A PHQ-9 screen was performed with the patient scoring a 12.  A GAD-7 screen was also performed with the patient scoring an 8.  Patient is alert and oriented x4, calm, cooperative, and fully engaged in conversation during the encounter.  Patient reports feeling calm yet restless.  He reports that he occasionally experiences repetitive thoughts and behaviors.  Patient denies suicidal or homicidal ideations.  He further denies auditory or visual hallucinations and does not appear to be responding to internal/external stimuli.  Patient endorses poor sleep even with the use of Seroquel.  Patient receives on average 3 to 4 hours of sleep each night.  Patient endorses good appetite and eats on average 3-4 meals along with a snack daily.  Patient denies alcohol consumption, tobacco use, and illicit drug use.  Visit Diagnosis:    ICD-10-CM   1. Generalized anxiety disorder with panic attacks  F41.1 QUEtiapine (SEROQUEL) 300 MG tablet   F41.0 propranolol (INDERAL) 10 MG tablet    escitalopram (LEXAPRO) 10 MG tablet    2. Insomnia due to other mental disorder  F51.05 QUEtiapine (SEROQUEL) 300 MG tablet   F99     3. Moderate episode of recurrent major depressive disorder (HCC)  F33.1 QUEtiapine (SEROQUEL) 300 MG tablet    escitalopram (LEXAPRO) 10 MG tablet      Past Psychiatric History:  Generalized anxiety disorder Major depressive disorder  Panic disorder Visual hallucinations  Past Medical History:  Past Medical History:  Diagnosis Date   Anxiety    Chronic hepatitis C (HCC) 04/27/2020   Depression    HIV (human immunodeficiency virus infection) (HCC)     Past Surgical History:  Procedure Laterality Date   CATARACT EXTRACTION Left     Family Psychiatric History:  Family history of psychiatric illness unknown  Family History:  Family  History  Problem Relation Age of Onset   Mental illness Mother    Bipolar disorder Brother     Social History:  Social History   Socioeconomic History   Marital status: Single    Spouse name: Not on file   Number of children: Not on file   Years of education: Not on file   Highest education level: Not on file  Occupational History   Not on file  Tobacco Use   Smoking status: Never   Smokeless tobacco: Never  Vaping Use   Vaping Use: Never used  Substance and Sexual Activity   Alcohol use: No   Drug use: No   Sexual activity: Yes    Comment: pt given condoms  Other Topics Concern   Not on file  Social History Narrative   Not on file   Social Determinants of Health   Financial Resource Strain: Not on file  Food Insecurity: Not on file  Transportation Needs: Not on file  Physical Activity: Not on file  Stress: Not on file  Social Connections: Not on file    Allergies: No Known Allergies  Metabolic Disorder Labs: Lab Results  Component Value Date   HGBA1C 5.2 07/07/2021   MPG 102.54 07/07/2021   MPG 96.8 05/06/2021   No results found for: PROLACTIN Lab Results  Component Value Date   CHOL 174 07/07/2021   TRIG 95 07/07/2021   HDL 32 (L) 07/07/2021   CHOLHDL 5.4 07/07/2021   VLDL 19 07/07/2021   LDLCALC 123 (H) 07/07/2021   LDLCALC 90 05/06/2021   Lab Results  Component Value Date   TSH 1.015 07/07/2021   TSH 1.199 05/06/2021    Therapeutic Level Labs: No results found for: LITHIUM No results found for: VALPROATE No components found for:  CBMZ  Current Medications: Current Outpatient Medications  Medication Sig Dispense Refill   antiseptic oral rinse (BIOTENE) LIQD 15 mLs by Mouth Rinse route as needed for dry mouth.  0   bictegravir-emtricitabine-tenofovir AF (BIKTARVY) 50-200-25 MG TABS tablet Take 1 tablet by mouth daily. 30 tablet 5   busPIRone (BUSPAR) 5 MG tablet Take 1 tablet (5 mg total) by mouth 3 (three) times daily. 90 tablet 0    escitalopram (LEXAPRO) 10 MG tablet Take 1 tablet (10 mg total) by mouth daily. 30 tablet 1   gabapentin (NEURONTIN) 100 MG capsule Take 2 capsules (200 mg total) by mouth 3 (three) times daily. 90 capsule 0   QUEtiapine (SEROQUEL XR) 50 MG TB24 24 hr tablet Take 1 tablet (50 mg total) by mouth daily. 30 tablet 0   tamsulosin (FLOMAX) 0.4 MG CAPS capsule Take 1 capsule (0.4 mg total) by mouth daily. 30 capsule 0   propranolol (INDERAL) 10 MG tablet Take 0.5 tablets (5 mg total) by mouth daily. 30 tablet 1   QUEtiapine (SEROQUEL) 300 MG tablet Take 1 tablet (300 mg total) by mouth at bedtime. 30 tablet 1   No current facility-administered medications for this visit.     Musculoskeletal: Strength & Muscle Tone: within normal limits Gait &  Station: normal Patient leans: N/A  Psychiatric Specialty Exam: Review of Systems  Psychiatric/Behavioral:  Positive for sleep disturbance. Negative for decreased concentration, dysphoric mood, hallucinations, self-injury and suicidal ideas. The patient is nervous/anxious. The patient is not hyperactive.    Blood pressure 132/79, pulse (!) 109, height 6\' 4"  (1.93 m), weight 208 lb (94.3 kg).Body mass index is 25.32 kg/m.  General Appearance: Neat  Eye Contact:  Good  Speech:  Unable to assess due to patient utilizing sign language  Volume:  Unable to assess due to patient utilizing sign language  Mood:  Anxious and Euthymic  Affect:  Appropriate and Congruent  Thought Process:  Coherent and Descriptions of Associations: Intact  Orientation:  Full (Time, Place, and Person)  Thought Content: WDL   Suicidal Thoughts:  No  Homicidal Thoughts:  No  Memory:  Immediate;   Good Recent;   Fair Remote;   Fair  Judgement:  Good  Insight:  Fair  Psychomotor Activity:  Normal  Concentration:  Concentration: Good and Attention Span: Good  Recall:  Good  Fund of Knowledge: Fair  Language:  Unable to assess due to patient utilizing sign language. Patient has  no issues being able to convey his chief complaints and thoughts through sign language.  Akathisia:  NA  Handed:  Right  AIMS (if indicated): not done  Assets:  Communication Skills Desire for Improvement Social Support  ADL's:  Intact  Cognition: WNL  Sleep:  Good   Screenings: AIMS    Flowsheet Row [facility] Ad[facility] ([facility]) [facility] ([facility]) [facility]  AIMS Total Score 13 3 0 0      AUDIT    Flowsheet Row [facility]) [facility] Ad[facility] (Discharged) [facility] (Dis[facility]) [facility] Admission (Discharged) from 11/28/2019 in [facility] Admission (Discharged) [facility]  Alcohol Use Disorder Identification Test Final Score (AUDIT) 0 0 0 0 0      GAD-7    Flowsheet Row Office Visit from 08/04/2021 in [facility] Office Visit from 06/09/2021 in [facility] Office Visit from 04/09/2021 in [facility] Office Visit from 02/19/2021 in [facility] Office Visit from 01/07/2021 in [facility]  Total GAD-7 Score 8 16 7 20 16       PHQ2-9    Flowsheet Row Office Visit from 08/04/2021 in [facility] ED from 07/05/2021 in [facility] Office Visit from 06/09/2021 in [facility] ED from 05/03/2021 in [facility] Office Visit from 04/09/2021 in [facility]  PHQ-2 Total Score 4 6 6 2 4   PHQ-9 Total Score 12 18 18 5 11       Flowsheet Row Office Visit from 08/04/2021 in [facility] Admission (Discharged) [facility] from 07/05/2021 in [facility]  C-SSRS RISK CATEGORY High Risk High Risk High Risk        Assessment and Plan:   Tyler Horn is  a 60 year old male with a past psychiatric history significant for insomnia, major depressive disorder with psychotic features, panic attacks, and generalized anxiety disorder who presents to [facility] behavioral health outpatient clinic for follow-up and medication management.  Language interpretive services were utilized during the encounter due to patient's use of sign language to communicate.  Patient presents to the clinic after recently being discharged from [facility] due to worsening depression and suicidal thoughts.  Patient was discharged from [facility] with a new medication regimen.  Patient reports that his symptoms are mostly controlled through the use of his new prescriptions but states that he has been having issues with his sleep.  Patient notes that he has also been having adverse side effects from his sertraline use.  Patient is interested in discontinuing sertraline and starting back on Lexapro for the management of his depressive symptoms anxiety.  Patient to be placed on Lexapro 10 mg daily.  Patient's Seroquel was adjusted from 200 mg to 300 mg at bedtime for the management of his mood, anxiety, and sleep disturbances.  Patient was agreeable to recommendations.  Patient was advised to taper off Zoloft by taking 25 mg daily for the next 3 days prior to discontinuing.  Patient expressed understanding.  1. Generalized anxiety disorder with panic attacks Patient to continue taking Buspirone 5 mg 3 times  daily for the management of his generalized anxiety disorder Patient to continue taking gabapentin 200 mg 3 times daily for the management of his generalized anxiety disorder Patient to continue taking Quetiapine 50 mg Tb24 24-hour tablet daily for the management of his generalized anxiety disorder  - QUEtiapine (SEROQUEL) 300 MG tablet; Take 1 tablet (300 mg total) by mouth at bedtime.  Dispense: 30 tablet; Refill: 1 - propranolol (INDERAL) 10 MG tablet; Take 0.5 tablets (5 mg total) by mouth daily.  Dispense: 30 tablet; Refill: 1 - escitalopram (LEXAPRO) 10 MG tablet; Take 1 tablet (10 mg total) by mouth daily.  Dispense: 30 tablet; Refill: 1  2. Insomnia due to other mental disorder  - QUEtiapine (SEROQUEL) 300 MG tablet; Take 1 tablet (300 mg total) by mouth at bedtime.  Dispense: 30 tablet; Refill: 1  3. Moderate episode of recurrent major depressive disorder (HCC)  - QUEtiapine (SEROQUEL) 300 MG tablet; Take 1 tablet (300 mg total) by mouth at bedtime.  Dispense: 30 tablet; Refill: 1 - escitalopram (LEXAPRO) 10 MG tablet; Take 1 tablet (10 mg total) by mouth daily.  Dispense: 30 tablet; Refill: 1  Patient to follow up in  6 weeks Provider spent a total of 28 minutes with the patient/reviewing the patient's chart  Meta Hatchet, PA 08/04/2021, 7:32 PM

## 2021-08-05 ENCOUNTER — Encounter: Payer: Self-pay | Admitting: Family

## 2021-08-06 ENCOUNTER — Ambulatory Visit: Payer: Self-pay | Admitting: Family

## 2021-08-06 ENCOUNTER — Ambulatory Visit: Payer: Self-pay

## 2021-08-06 ENCOUNTER — Other Ambulatory Visit: Payer: Self-pay

## 2021-08-06 ENCOUNTER — Encounter: Payer: Self-pay | Admitting: Family

## 2021-08-06 VITALS — BP 110/70 | HR 81 | Temp 97.7°F | Wt 209.0 lb

## 2021-08-06 DIAGNOSIS — Z113 Encounter for screening for infections with a predominantly sexual mode of transmission: Secondary | ICD-10-CM

## 2021-08-06 DIAGNOSIS — Z21 Asymptomatic human immunodeficiency virus [HIV] infection status: Secondary | ICD-10-CM

## 2021-08-06 DIAGNOSIS — Z23 Encounter for immunization: Secondary | ICD-10-CM

## 2021-08-06 DIAGNOSIS — Z0000 Encounter for general adult medical examination without abnormal findings: Secondary | ICD-10-CM

## 2021-08-06 DIAGNOSIS — F411 Generalized anxiety disorder: Secondary | ICD-10-CM

## 2021-08-06 MED ORDER — BIKTARVY 50-200-25 MG PO TABS: 1.0000 | ORAL_TABLET | Freq: Every day | ORAL | 5 refills | Status: DC

## 2021-08-06 NOTE — Patient Instructions (Addendum)
Nice to see you.  We will check your lab work today.  Continue to take your medication daily as prescribed.  Refills have been sent to the pharmacy.  Plan for follow up in 4 months or sooner if needed with lab work on the same day.  Have a great day and stay safe!  

## 2021-08-06 NOTE — Assessment & Plan Note (Signed)
Mr. Borum continues to have well-controlled virus with good adherence and tolerance to his ART regimen of Biktarvy.  No signs/symptoms of opportunistic infection.  We reviewed previous lab work and discussed plan of care.  Check ordered today.  Continue current dose of Biktarvy.  We will need to renew financial assistance after January 1.  Plan for follow-up in 4 months or sooner if needed with lab work on the same day.

## 2021-08-06 NOTE — Progress Notes (Signed)
Brief Narrative   Patient ID: Tyler Horn, male    DOB: 15-Nov-1960, 60 y.o.   MRN: [id number]  Mr. Tyler Horn is a 60 year old Caucasian male diagnosed with HIV-1 in 2005 with risk factor of MSM.  CD4 nadir and initial viral load are unknown.  [id number] negative. Came to clinic on nevirapine and Truvada and switched to Shelly.  No history of opportunistic infection.  Subjective:    Chief Complaint  Patient presents with   Follow-up    B20    HPI:  Tyler Horn is a 60 y.o. male with HIV disease last seen on 04/14/2021 with well-controlled virus and good adherence and tolerance to his ART regimen of Biktarvy.  Viral load at the time was undetectable with CD4 count of 839.  In the interim has been hospitalized for behavioral health concerns.  Here today for routine follow-up. Mr Tyler Horn is hearing impaired and a sign language interpreter is present to aid in communication.   [name] continues to have well-controlled virus with good adherence and tolerance to his ART regimen of Biktarvy.  No signs/symptoms of opportunistic infection.  Overall feeling well today with no new concerns/complaints. Denies fevers, chills, night sweats, headaches, changes in vision, neck pain/stiffness, nausea, diarrhea, vomiting, lesions or rashes.  Mr. Tyler Horn is no problems obtaining medication from the pharmacy remains covered by [employer].  With adjustment of psychiatric medications he is feeling well denies any feelings being down, depressed, or hopeless recently.  Drinks alcohol on occasion with no current recreational or illicit drug use or tobacco use.  Condoms offered. Healthcare maintenance due includes influenza vaccination.    No Known Allergies    Outpatient Medications Prior to Visit  Medication Sig Dispense Refill   antiseptic oral rinse (BIOTENE) LIQD 15 mLs by Mouth Rinse route as needed for dry mouth.  0   busPIRone (BUSPAR) 5 MG tablet Take 1 tablet (5 mg total) by mouth 3 (three) times  daily. 90 tablet 0   escitalopram (LEXAPRO) 10 MG tablet Take 1 tablet (10 mg total) by mouth daily. 30 tablet 1   gabapentin (NEURONTIN) 100 MG capsule Take 2 capsules (200 mg total) by mouth 3 (three) times daily. 90 capsule 0   propranolol (INDERAL) 10 MG tablet Take 0.5 tablets (5 mg total) by mouth daily. 30 tablet 1   QUEtiapine (SEROQUEL XR) 50 MG TB24 24 hr tablet Take 1 tablet (50 mg total) by mouth daily. 30 tablet 0   QUEtiapine (SEROQUEL) 300 MG tablet Take 1 tablet (300 mg total) by mouth at bedtime. 30 tablet 1   tamsulosin (FLOMAX) 0.4 MG CAPS capsule Take 1 capsule (0.4 mg total) by mouth daily. 30 capsule 0   bictegravir-emtricitabine-tenofovir AF (BIKTARVY) 50-200-25 MG TABS tablet Take 1 tablet by mouth daily. 30 tablet 5   No facility-administered medications prior to visit.     Past Medical History:  Diagnosis Date   Anxiety    Chronic hepatitis C (HCC) 04/27/2020   Depression    HIV (human immunodeficiency virus infection) (HCC)      Past Surgical History:  Procedure Laterality Date   CATARACT EXTRACTION Left       Review of Systems  Constitutional:  Negative for appetite change, chills, fatigue, fever and unexpected weight change.  Eyes:  Negative for visual disturbance.  Respiratory:  Negative for cough, chest tightness, shortness of breath and wheezing.   Cardiovascular:  Negative for chest pain and leg swelling.  Gastrointestinal:  Negative for abdominal pain, constipation,  diarrhea, nausea and vomiting.  Genitourinary:  Negative for dysuria, flank pain, frequency, genital sores, hematuria and urgency.  Skin:  Negative for rash.  Allergic/Immunologic: Negative for immunocompromised state.  Neurological:  Negative for dizziness and headaches.     Objective:    BP 110/70 (BP Location: Right Arm)   Pulse 81   Temp 97.7 F (36.5 C) (Temporal)   Wt 209 lb (94.8 kg)   BMI 25.44 kg/m  Nursing note and vital signs reviewed.  Physical  Exam Constitutional:      General: He is not in acute distress.    Appearance: He is well-developed.  Eyes:     Conjunctiva/sclera: Conjunctivae normal.  Cardiovascular:     Rate and Rhythm: Normal rate and regular rhythm.     Heart sounds: Normal heart sounds. No murmur heard.   No friction rub. No gallop.  Pulmonary:     Effort: Pulmonary effort is normal. No respiratory distress.     Breath sounds: Normal breath sounds. No wheezing or rales.  Chest:     Chest wall: No tenderness.  Abdominal:     General: Bowel sounds are normal.     Palpations: Abdomen is soft.     Tenderness: There is no abdominal tenderness.  Musculoskeletal:     Cervical back: Neck supple.  Lymphadenopathy:     Cervical: No cervical adenopathy.  Skin:    General: Skin is warm and dry.     Findings: No rash.  Neurological:     Mental Status: He is alert and oriented to person, place, and time.  Psychiatric:        Behavior: Behavior normal.        Thought Content: Thought content normal.        Judgment: Judgment normal.     Depression screen [facility] 2/9 08/06/2021 07/05/2021 12/18/2020  Decreased Interest 0 3 0  Down, Depressed, Hopeless 0 3 1  PHQ - 2 Score 0 6 1  Altered sleeping - 1 -  Tired, decreased energy - 3 -  Change in appetite - 1 -  Feeling bad or failure about yourself  - 3 -  Trouble concentrating - 3 -  Moving slowly or fidgety/restless - 0 -  Suicidal thoughts - 1 -  PHQ-9 Score - 18 -  Some encounter information is confidential and restricted. Go to Review Flowsheets activity to see all data.  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   Post-traumatic stress disorder, unspecified 11/28/2019   Sedative, 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 Segar continues to have well-controlled virus with good adherence and tolerance to his ART regimen of Biktarvy.  No signs/symptoms of opportunistic infection.  We reviewed previous lab work and discussed plan of care.  Check ordered today.  Continue current dose of Biktarvy.  We will need to renew financial assistance after January 1.  Plan for follow-up in 4 months or sooner if needed with lab work on the same day.      Relevant Medications   bictegravir-emtricitabine-tenofovir AF (BIKTARVY) 50-200-25 MG TABS tablet   Other Relevant Orders   HIV-1 RNA quant-no reflex-bld   T-helper cell (CD4)- ([facility] clinic only)   Healthcare maintenance    Discussed importance of safe sexual practices and condom use.  Condoms offered. Influenza vaccine updated today.      GAD (generalized anxiety disorder)    Recently hospitalized for increasing levels of anxiety.  Medication changes have helped to improve his mood and decrease anxiety.  Feeling well today.  Has an appointment with  counseling today.  Continue current dose of medications with changes per psychiatry.      Other Visit Diagnoses     Screening for STDs (sexually transmitted diseases)       Relevant Orders   RPR   Need for immunization against influenza       Relevant Orders   Flu Vaccine QUAD 68mo+IM (Fluarix, Fluzone & Alfiuria Quad PF) (Completed)        I am having [name] maintain his busPIRone, antiseptic oral rinse, gabapentin, QUEtiapine, tamsulosin, QUEtiapine, propranolol, escitalopram, and Biktarvy.   Meds ordered this encounter  Medications   bictegravir-emtricitabine-tenofovir AF (BIKTARVY) 50-200-25 MG TABS tablet    Sig: Take 1 tablet by mouth daily.    Dispense:  30 tablet    Refill:  5    Order Specific Question:   Supervising Provider    Answer:   Judyann Munson [[id number]]     Follow-up: Return in about 4 months (around 12/05/2021), or if symptoms worsen or fail to improve.   Marcos Eke, MSN, FNP-C Nurse Practitioner [facility] Main number: [phone]

## 2021-08-06 NOTE — Assessment & Plan Note (Signed)
Recently hospitalized for increasing levels of anxiety.  Medication changes have helped to improve his mood and decrease anxiety.  Feeling well today.  Has an appointment with counseling today.  Continue current dose of medications with changes per psychiatry.

## 2021-08-06 NOTE — Assessment & Plan Note (Signed)
   Discussed importance of safe sexual practices and condom use.  Condoms offered.  Influenza vaccine updated today.

## 2021-08-07 LAB — T-HELPER CELL (CD4) - (RCID CLINIC ONLY)
CD4 % Helper T Cell: 33 % (ref 33–65)
CD4 T Cell Abs: 974 /uL (ref 400–1790)

## 2021-08-10 LAB — HIV-1 RNA QUANT-NO REFLEX-BLD
HIV 1 RNA Quant: <20 Copies/mL — ABNORMAL HIGH
HIV-1 RNA Quant, Log: <1.3 Log cps/mL — ABNORMAL HIGH

## 2021-08-10 LAB — RPR TITER: RPR Titer: 1:1 {titer} — ABNORMAL HIGH

## 2021-08-10 LAB — FLUORESCENT TREPONEMAL AB(FTA)-IGG-BLD: Fluorescent Treponemal ABS: REACTIVE — AB

## 2021-08-10 LAB — RPR: RPR Ser Ql: REACTIVE — AB

## 2021-08-11 ENCOUNTER — Other Ambulatory Visit (HOSPITAL_COMMUNITY): Payer: Self-pay | Admitting: Psychiatry

## 2021-08-11 NOTE — Progress Notes (Unsigned)
Therapist met with client as scheduled and continued rapport building. Therapist introduced and shared some information about herself and encouraged the same from client in order to build trust and openness within the counseling relationship. Therapist discussed / developed / reviewed treatment plan with client based on the result from client's assessment. Therapist discussed their current issues /diagnosis of DSM-5/as well as recommendations and target population eligibility services. Therapist assessed for SI/HI during session  Client met with therapist as scheduled. Client presented alert mood was euthymic; affect appeared to be congruent with client report of mood. Appearance was relaxed/casual, and attitude was appropriate and casual. Client was receptive to rapport building and goals implemented by therapist. Client shared some information about herself, such as: expressing likes, dislikes, and strengths. Client was able to make connections between consequences, challenges and alternative thoughts of mental health recovery. 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 Client talked briefly about his mother and the current conflict between them. Client denied SI/HI.

## 2021-08-12 NOTE — Telephone Encounter (Signed)
Patient was instructed to follow up with his outpatient provider for refills of his medications.

## 2021-08-20 ENCOUNTER — Ambulatory Visit: Payer: Self-pay

## 2021-08-24 ENCOUNTER — Other Ambulatory Visit (HOSPITAL_COMMUNITY): Payer: Self-pay | Admitting: Psychiatry

## 2021-08-26 ENCOUNTER — Telehealth (HOSPITAL_COMMUNITY): Payer: Self-pay | Admitting: *Deleted

## 2021-08-26 ENCOUNTER — Ambulatory Visit (HOSPITAL_COMMUNITY)
Admission: EM | Admit: 2021-08-26 | Discharge: 2021-08-26 | Disposition: A | Discharge status: Home / Self Care | Payer: No Payment, Other

## 2021-08-26 ENCOUNTER — Telehealth (HOSPITAL_COMMUNITY): Payer: Self-pay | Admitting: Physician Assistant

## 2021-08-26 NOTE — Telephone Encounter (Signed)
RN noted specific medication concerns after speaking with patient and forwarded to U. [name] PA-C for follow-op.

## 2021-08-26 NOTE — Telephone Encounter (Signed)
Discussed medicine regimen with Eddie PA, confirmed meds, doses and dates of last fill with his pharmacy and then called patient via TTY with the necessary information. His meds are as follows: Buspar 5 mg TID, he is out and needs a new rx, Gabapentin 200 mg TID last filled 11/17 needs a new rx, Inderal 5 mg QD as a supply till 1/5, Seroquel 300 mg HS as meds till 1/5, Seroquel XR 50 mg has meds till 1/5 and he is OK if Eddie would increase his Lexapro to 20 mg he says the 10 mg isnt helping. Will ask Eddie PA to call in the needed rxs.

## 2021-08-26 NOTE — Telephone Encounter (Signed)
Patient brought up to front desk per a staff person downstairs for [place] to correct his medicines which per Tanuj are not right. He states he takes 40 mg of Lexapro and the sig on the bottle is 10 mg, also the bottle on his buspar is quantity of 3 which is incorrect and his gabapentin is per him supposed to be 100 mg TID not 200 mg TID. Will forward concern to Eddie PA to make any needed changes and will call patient when and if changes are made and new rxs called in.

## 2021-08-26 NOTE — Telephone Encounter (Signed)
Patient presented with concerns medications are not correctly written for the pharmacy.  Please review prescriptions & advise pharmacy and patient.

## 2021-08-27 ENCOUNTER — Other Ambulatory Visit: Payer: Self-pay

## 2021-08-27 ENCOUNTER — Other Ambulatory Visit (HOSPITAL_COMMUNITY): Payer: Self-pay | Admitting: Physician Assistant

## 2021-08-27 ENCOUNTER — Ambulatory Visit: Payer: Self-pay

## 2021-08-27 DIAGNOSIS — F411 Generalized anxiety disorder: Secondary | ICD-10-CM

## 2021-08-27 DIAGNOSIS — F333 Major depressive disorder, recurrent, severe with psychotic symptoms: Secondary | ICD-10-CM

## 2021-08-27 MED ORDER — ESCITALOPRAM OXALATE 20 MG PO TABS: 20.0000 mg | ORAL_TABLET | Freq: Every day | ORAL | 1 refills | Status: DC

## 2021-08-27 MED ORDER — QUETIAPINE FUMARATE ER 50 MG PO TB24: 50.0000 mg | ORAL_TABLET | Freq: Every day | ORAL | 1 refills | Status: DC

## 2021-08-27 MED ORDER — BUSPIRONE HCL 5 MG PO TABS: 5.0000 mg | ORAL_TABLET | Freq: Three times a day (TID) | ORAL | 1 refills | Status: DC

## 2021-08-27 MED ORDER — GABAPENTIN 100 MG PO CAPS: 200.0000 mg | ORAL_CAPSULE | Freq: Three times a day (TID) | ORAL | 1 refills | Status: DC

## 2021-08-27 NOTE — Progress Notes (Signed)
Provider discussed with patient medications to be sent to pharmacy. Provider to increase patient's Lexapro dosage from 10 mg to 20 mg daily for the management of his depression and anxiety. Provider is also providing refills for the following prescriptions:  Quetiapine (Seroquel XR) 50 mg TB24 24 hour tablet daily Buspirone 5 mg 3 times daily Gabapentin 200 mg 3 times daily

## 2021-08-27 NOTE — Progress Notes (Unsigned)
Therapist met with client as scheduled and continued rapport building. Therapist introduced and shared some information about herself and encouraged the same from client in order to build trust and openness within the counseling relationship. Therapist discussed / developed / reviewed treatment plan with client based on the result from client's assessment. Therapist discussed their current issues /diagnosis of DSM-5/as well as recommendations and target population eligibility services. Therapist assessed for SI/HI during session  Client met with therapist as scheduled. Client presented alert mood was euthymic; affect appeared to be congruent with client report of mood. Appearance was relaxed/casual, and attitude was appropriate and casual. Client was receptive to rapport building and goals implemented by therapist. Client shared some information about herself, such as: expressing likes, dislikes, and strengths. Client was able to make connections between consequences, challenges and alternative thoughts of mental health recovery. Client denied SI/HI.

## 2021-09-03 ENCOUNTER — Ambulatory Visit: Payer: Self-pay

## 2021-09-03 ENCOUNTER — Other Ambulatory Visit: Payer: Self-pay

## 2021-09-03 NOTE — Progress Notes (Unsigned)
Therapist met with client as scheduled and discussed treatment plan/case management progress. Therapist used active listening skills to provide client with opportunity to disclose previous challenges and successes. Therapist assessed for SI/HI during session and will follow-up with client during the next session.  Client met with therapist as scheduled. Client presented alert mood and appeared to euthymic; affect was congruent with client report of mood. Client was able to make connections between consequences, challenges and alternative thoughts of mental health recovery during this session. Client continues to express their needs in the development of treatment goals and described different communities of which can serve an added support for them. Client expressed concerns about different problematic areas in their life; one such as the current dynamics between he and his mother. Clients main focus is his loneliness along with his depression, and anxiety. Client stated that his mother is refusing to communicate with him due to two missed appointments in which she had to travel hundreds of miles to conduct. Client stated that his mother made two separate appointments with an attorney and client still missed these two appts. Therapeutic goal(s) continues to remain minimal at this time. client denied SI/HI.

## 2021-09-07 ENCOUNTER — Telehealth (HOSPITAL_COMMUNITY): Payer: Self-pay | Admitting: Family

## 2021-09-07 NOTE — BH Assessment (Signed)
Care Management - [facility] Follow Up Discharges   Writer attempted to make contact with patient today and was unsuccessful.  Writer left a HIPPA compliant voice message.   Per chart review, patient has a follow up appt with established provider, Link Snuffer, NP on 09-11-21 at [facility] on 2nd floor.

## 2021-09-08 ENCOUNTER — Other Ambulatory Visit (HOSPITAL_COMMUNITY): Payer: Self-pay | Admitting: Family

## 2021-09-11 ENCOUNTER — Encounter (HOSPITAL_COMMUNITY): Payer: Self-pay | Admitting: Physician Assistant

## 2021-09-11 ENCOUNTER — Telehealth (INDEPENDENT_AMBULATORY_CARE_PROVIDER_SITE_OTHER): Payer: No Payment, Other | Admitting: Physician Assistant

## 2021-09-11 DIAGNOSIS — F410 Panic disorder [episodic paroxysmal anxiety] without agoraphobia: Secondary | ICD-10-CM | POA: Diagnosis not present

## 2021-09-11 DIAGNOSIS — F5105 Insomnia due to other mental disorder: Secondary | ICD-10-CM | POA: Diagnosis not present

## 2021-09-11 DIAGNOSIS — F411 Generalized anxiety disorder: Secondary | ICD-10-CM | POA: Diagnosis not present

## 2021-09-11 DIAGNOSIS — F331 Major depressive disorder, recurrent, moderate: Secondary | ICD-10-CM

## 2021-09-11 DIAGNOSIS — F99 Mental disorder, not otherwise specified: Secondary | ICD-10-CM

## 2021-09-11 MED ORDER — QUETIAPINE FUMARATE 300 MG PO TABS: 300.0000 mg | ORAL_TABLET | Freq: Every day | ORAL | 1 refills | Status: DC

## 2021-09-11 MED ORDER — QUETIAPINE FUMARATE ER 50 MG PO TB24: 50.0000 mg | ORAL_TABLET | Freq: Every day | ORAL | 1 refills | Status: DC

## 2021-09-11 NOTE — Progress Notes (Signed)
BH MD/PA/NP OP Progress Note  09/11/2021 3:53 PM Tyler Horn  MRN:  [id number]  Chief Complaint:  Chief Complaint   Medication Management    HPI:   Tyler Horn is a 61 year old male with a past psychiatric history significant for insomnia, major depressive disorder with psychotic features, panic attacks, and generalized anxiety disorder who presents to [facility] for follow-up and medication management.  Language interpretive services were utilized during the encounter due to patient's use of sign language to communicate.  Patient is currently being managed on the following medications:  Buspirone 5 mg 3 times daily Gabapentin 200 mg 3 times daily Propranolol 5 mg daily Seroquel 300 mg at bedtime Seroquel XR 50 mg TB24 tablet daily Lexapro 20 mg daily  Patient reports that his mental health is getting better little by little.  He reports good news regarding his relationship with his mother.  He states that they have been rekindling their relationship and that he recently was able to talk with her.  He reports that his medications have been helpful in the management of his mental health.  In addition to taking his medications as prescribed, patient states that he has been going out more to avoid being lonely.    He endorses improvement in his depression.  He states that he is also walking more and tries to set aside time to walk at a park near his home.  He has also been setting aside time to communicate with his friends more often.  Patient endorses manageable anxiety and rates his anxiety a 2 out of 10.  Due to improvement in his mental health, patient states that he is ready to work.  He reports that he will soon be reaching out to a job coach to figure out jobs available for him to work.  Patient states that he has also been placed on disability ([employer]) and believes that the funds that he receives for disability is enough to support his current  expenses.   Patient has aspirations to move back to [place] to be closer to family.  He is currently renting a room with a friend he knows and [facility].  Patient states that he occasionally feels overwhelmed and reports that he has recently lost a total of 11 friends since last April.  A GAD-7 screen was performed with the patient scoring a 4.  Patient is alert and oriented x4, calm, cooperative, and fully engaged in conversation during the encounter.  Patient endorses good mood.  Patient denies suicidal or homicidal ideations.  He further denies auditory or visual hallucinations and does not appear to be responding to internal/external stimuli.  Patient endorses good sleep and receives on average 8 to 10 hours of sleep each night.  Patient endorses good appetite.  Patient denies alcohol consumption, tobacco use, and illicit drug use.  Visit Diagnosis:    ICD-10-CM   1. Generalized anxiety disorder with panic attacks  F41.1 QUEtiapine (SEROQUEL) 300 MG tablet   F41.0 QUEtiapine (SEROQUEL XR) 50 MG TB24 24 hr tablet    2. Insomnia due to other mental disorder  F51.05 QUEtiapine (SEROQUEL) 300 MG tablet   F99     3. Moderate episode of recurrent major depressive disorder (HCC)  F33.1 QUEtiapine (SEROQUEL) 300 MG tablet    4. Severe episode of recurrent major depressive disorder, with psychotic features (HCC)  F33.3 QUEtiapine (SEROQUEL XR) 50 MG TB24 24 hr tablet      Past Psychiatric History:  Generalized  anxiety disorder Major depressive disorder Panic disorder Visual hallucinations  Past Medical History:  Past Medical History:  Diagnosis Date   Anxiety    Chronic hepatitis C (HCC) 04/27/2020   Depression    HIV (human immunodeficiency virus infection) (HCC)     Past Surgical History:  Procedure Laterality Date   CATARACT EXTRACTION Left     Family Psychiatric History:  Family history of psychiatric illness unknown  Family History:  Family History  Problem  Relation Age of Onset   Mental illness Mother    Bipolar disorder Brother     Social History:  Social History   Socioeconomic History   Marital status: Single    Spouse name: Not on file   Number of children: Not on file   Years of education: Not on file   Highest education level: Not on file  Occupational History   Not on file  Tobacco Use   Smoking status: Never   Smokeless tobacco: Never  Vaping Use   Vaping Use: Never used  Substance and Sexual Activity   Alcohol use: Yes    Comment: occassionally   Drug use: No   Sexual activity: Yes    Comment: declined condoms  Other Topics Concern   Not on file  Social History Narrative   Not on file   Social Determinants of Health   Financial Resource Strain: Not on file  Food Insecurity: Not on file  Transportation Needs: Not on file  Physical Activity: Not on file  Stress: Not on file  Social Connections: Not on file    Allergies: No Known Allergies  Metabolic Disorder Labs: Lab Results  Component Value Date   HGBA1C 5.2 07/07/2021   MPG 102.54 07/07/2021   MPG 96.8 05/06/2021   No results found for: PROLACTIN Lab Results  Component Value Date   CHOL 174 07/07/2021   TRIG 95 07/07/2021   HDL 32 (L) 07/07/2021   CHOLHDL 5.4 07/07/2021   VLDL 19 07/07/2021   LDLCALC 123 (H) 07/07/2021   LDLCALC 90 05/06/2021   Lab Results  Component Value Date   TSH 1.015 07/07/2021   TSH 1.199 05/06/2021    Therapeutic Level Labs: No results found for: LITHIUM No results found for: VALPROATE No components found for:  CBMZ  Current Medications: Current Outpatient Medications  Medication Sig Dispense Refill   antiseptic oral rinse (BIOTENE) LIQD 15 mLs by Mouth Rinse route as needed for dry mouth.  0   bictegravir-emtricitabine-tenofovir AF (BIKTARVY) 50-200-25 MG TABS tablet Take 1 tablet by mouth daily. 30 tablet 5   busPIRone (BUSPAR) 5 MG tablet Take 1 tablet (5 mg total) by mouth 3 (three) times daily. 90  tablet 1   escitalopram (LEXAPRO) 20 MG tablet Take 1 tablet (20 mg total) by mouth daily. 30 tablet 1   gabapentin (NEURONTIN) 100 MG capsule Take 2 capsules (200 mg total) by mouth 3 (three) times daily. 180 capsule 1   propranolol (INDERAL) 10 MG tablet Take 0.5 tablets (5 mg total) by mouth daily. 30 tablet 1   QUEtiapine (SEROQUEL XR) 50 MG TB24 24 hr tablet Take 1 tablet (50 mg total) by mouth daily. 30 tablet 1   QUEtiapine (SEROQUEL) 300 MG tablet Take 1 tablet (300 mg total) by mouth at bedtime. 30 tablet 1   tamsulosin (FLOMAX) 0.4 MG CAPS capsule Take 1 capsule (0.4 mg total) by mouth daily. 30 capsule 0   No current facility-administered medications for this visit.     Musculoskeletal:  Strength & Muscle Tone: within normal limits Gait & Station: normal Patient leans: N/A  Psychiatric Specialty Exam: Review of Systems  Psychiatric/Behavioral:  Negative for decreased concentration, dysphoric mood, hallucinations, self-injury, sleep disturbance and suicidal ideas. The patient is nervous/anxious. The patient is not hyperactive.    Blood pressure 123/69, pulse 84, height 6\' 4"  (1.93 m), weight 201 lb (91.2 kg), SpO2 99 %.Body mass index is 24.47 kg/m.  General Appearance: Well Groomed  Eye Contact:  Good  Speech:  Clear and Coherent and Normal Rate  Volume:  Normal  Mood:  Anxious and Euthymic  Affect:  Appropriate and Congruent  Thought Process:  Coherent and Descriptions of Associations: Intact  Orientation:  Full (Time, Place, and Person)  Thought Content: WDL   Suicidal Thoughts:  No  Homicidal Thoughts:  No  Memory:  Immediate;   Good Recent;   Fair Remote;   Fair  Judgement:  Good  Insight:  Fair  Psychomotor Activity:  Normal  Concentration:  Concentration: Good and Attention Span: Good  Recall:  Good  Fund of Knowledge: Fair  Language: Unable to assess due to patient utilizing sign language. Patient has no issues being able to convey his chief complaints and  thoughts through sign language.  Akathisia:  No  Handed:  Right  AIMS (if indicated): not done  Assets:  Communication Skills Desire for Improvement Social Support  ADL's:  Intact  Cognition: WNL  Sleep:  Good   Screenings: AIMS    Flowsheet Row [facility] Ad[facility] ([facility]) [facility] ([facility]) [facility]  AIMS Total Score 13 3 0 0      AUDIT    Flowsheet Row [facility]) [facility] Ad[facility] (Dis[facility]) [facility] (Dis[facility]) [facility] Ad[facility] ([facility]) [facility]  Alcohol Use Disorder Identification Test Final Score (AUDIT) 0 0 0 0 0      GAD-7    Flowsheet Row [facility]  Total GAD-7 Score 4 8 16 7 20       PHQ2-9    Flowsheet Row [facility]2021  [facility]  PHQ-2 Total Score 1 0 4 6 6   PHQ-9 Total Score -- -- 12 18 18       Flowsheet Row [facility]  C-SSRS RISK CATEGORY No Risk High Risk High Risk        Assessment and Plan:   Tyler SpareSteven  Horn is a 61 year old male with a past psychiatric history significant for insomnia, major depressive disorder with psychotic features, panic attacks, and generalized anxiety disorder who presents to [facility] for follow-up and medication management.  Language interpretive services were utilized during the encounter due to patient's use of sign language to communicate.  Patient reports no issues or concerns regarding his current medication regimen.  He states that his medications have been effective in the management of his symptoms and he has also been trying to engage in various activities to help lift his spirits.  Patient denies the need for dosage adjustments at this time and is requesting refills on his medications following the conclusion of the encounter.  Patient's medications to be e-prescribed to pharmacy of choice.  1. Generalized anxiety disorder with panic attacks Patient to continue taking Buspirone 5 mg 3 times daily for the management of his generalized anxiety disorder Patient to continue taking gabapentin 200 mg 3 times daily for the management of his generalized anxiety disorder Patient to continue taking Lexapro 20 mg daily for the management of his generalized anxiety disorder with panic attacks  - QUEtiapine (SEROQUEL) 300 MG tablet; Take 1 tablet (300 mg total) by mouth at bedtime.  Dispense: 30 tablet; Refill: 1 - QUEtiapine (SEROQUEL XR) 50 MG TB24 24 hr tablet; Take 1 tablet (50 mg total)  by mouth daily.  Dispense: 30 tablet; Refill: 1  2. Insomnia due to other mental disorder  - QUEtiapine (SEROQUEL) 300 MG tablet; Take 1 tablet (300 mg total) by mouth at bedtime.  Dispense: 30 tablet; Refill: 1  3. Moderate episode of recurrent major depressive disorder (HCC) Patient to continue taking Lexapro 20 mg daily for the management of his major depressive disorder  - QUEtiapine (SEROQUEL) 300 MG tablet; Take 1 tablet (300 mg total) by mouth at bedtime.  Dispense: 30 tablet; Refill: 1  Patient to follow up in 2 months Provider spent a total of 21 minutes with the patient/reviewing patient's chart  Meta Hatchet, PA 09/11/2021, 3:53 PM

## 2021-09-17 ENCOUNTER — Ambulatory Visit: Payer: Self-pay

## 2021-09-17 ENCOUNTER — Other Ambulatory Visit: Payer: Self-pay

## 2021-09-22 NOTE — Progress Notes (Unsigned)
Therapist met with client as scheduled and discussed treatment plan/case management progress. Therapist used active listening skills to provide client with opportunity to disclose previous challenges and successes. Therapist assessed for SI/HI during session and will follow-up with client during the next session. ° °Client met with therapist as scheduled. Client presented alert mood and appeared to euthymic; affect was congruent with client report of mood. Client was able to make connections between consequences, challenges and alternative thoughts of mental health recovery during this session. Client continues to express their needs in the development of treatment goals and described different communities of which can serve an added support for them. Client talked about the improvements he made with his therapeutic goals and how he is stabilized on his medications. Client continues to have concerns with he and his mother's relationship, due to the fact that they have not spoken in several months. Therapeutic goal(s) continues to remain minimal at this time. client denied SI/HI. °

## 2021-10-01 ENCOUNTER — Other Ambulatory Visit: Payer: Self-pay

## 2021-10-01 ENCOUNTER — Ambulatory Visit: Payer: Self-pay

## 2021-10-06 NOTE — Progress Notes (Unsigned)
Therapist met with client as scheduled and discussed treatment plan/case management progress. Therapist used active listening skills to provide client with opportunity to disclose previous challenges and successes. Therapist assessed for SI/HI during session and will follow-up with client during the next session.  Client met with therapist as scheduled. Client presented alert mood and appeared to euthymic; affect was congruent with client report of mood. Client was able to make connections between consequences, challenges and alternative thoughts of mental health recovery during this session. Client continues to express their needs in the development of treatment goals and described different communities of which can serve an added support for them. Client talked about the improvements he made with his therapeutic goals and how he is stabilized on his medications. Client continues to work on his networking and alleviate issues with loneliness through work. Therapeutic goal(s) continues to remain minimal at this time. client denied SI/HI.

## 2021-10-17 ENCOUNTER — Other Ambulatory Visit (HOSPITAL_COMMUNITY): Payer: Self-pay | Admitting: Physician Assistant

## 2021-10-17 DIAGNOSIS — F410 Panic disorder [episodic paroxysmal anxiety] without agoraphobia: Secondary | ICD-10-CM

## 2021-10-28 ENCOUNTER — Other Ambulatory Visit (HOSPITAL_COMMUNITY): Payer: Self-pay | Admitting: Physician Assistant

## 2021-10-28 DIAGNOSIS — F411 Generalized anxiety disorder: Secondary | ICD-10-CM

## 2021-10-29 ENCOUNTER — Other Ambulatory Visit: Payer: Self-pay

## 2021-10-29 ENCOUNTER — Ambulatory Visit: Payer: Self-pay

## 2021-10-29 NOTE — Progress Notes (Unsigned)
Therapist met with client as scheduled and discussed treatment plan/case management progress. Therapist used active listening skills to provide client with opportunity to disclose previous challenges and successes. Therapist assessed for SI/HI during session and will follow-up with client during the next session. ? ?Client met with therapist as scheduled. Client presented alert mood and appeared to euthymic; affect was congruent with client report of mood. Client was able to make connections between consequences, challenges and alternative thoughts of mental health recovery during this session. Client continues to express their needs in the development of treatment goals and described different communities of which can serve an added support for them. Client talked about taking advantage of upcoming activities for the Deaf and Hard of Hearing. Client informed clinician that there will be an even taking place this spring and has activities lined up to start engaging with others in the community. Client states he is more balanced and there are not issues with his medications. Client also reports that things are slowing opening up with his mother as she messaged him on Valentines Day that she loved him. Client plans to make a trip to see her on Mother's Day. Therapeutic goal(s) continues to remain minimal at this time. client denied SI/HI. ?

## 2021-11-10 ENCOUNTER — Other Ambulatory Visit: Payer: Self-pay

## 2021-11-10 ENCOUNTER — Encounter (HOSPITAL_COMMUNITY): Payer: Self-pay | Admitting: Physician Assistant

## 2021-11-10 ENCOUNTER — Ambulatory Visit (INDEPENDENT_AMBULATORY_CARE_PROVIDER_SITE_OTHER): Payer: No Payment, Other | Admitting: Physician Assistant

## 2021-11-10 DIAGNOSIS — F411 Generalized anxiety disorder: Secondary | ICD-10-CM | POA: Diagnosis not present

## 2021-11-10 DIAGNOSIS — F410 Panic disorder [episodic paroxysmal anxiety] without agoraphobia: Secondary | ICD-10-CM

## 2021-11-10 DIAGNOSIS — F331 Major depressive disorder, recurrent, moderate: Secondary | ICD-10-CM

## 2021-11-10 DIAGNOSIS — F5105 Insomnia due to other mental disorder: Secondary | ICD-10-CM | POA: Diagnosis not present

## 2021-11-10 DIAGNOSIS — F99 Mental disorder, not otherwise specified: Secondary | ICD-10-CM

## 2021-11-10 MED ORDER — ESCITALOPRAM OXALATE 20 MG PO TABS: 20.0000 mg | ORAL_TABLET | Freq: Every day | ORAL | 2 refills | Status: DC

## 2021-11-10 MED ORDER — GABAPENTIN 100 MG PO CAPS: ORAL_CAPSULE | ORAL | 2 refills | Status: DC

## 2021-11-10 MED ORDER — QUETIAPINE FUMARATE 300 MG PO TABS: 300.0000 mg | ORAL_TABLET | Freq: Every day | ORAL | 2 refills | Status: DC

## 2021-11-10 MED ORDER — PROPRANOLOL HCL 10 MG PO TABS: ORAL_TABLET | ORAL | 2 refills | Status: DC

## 2021-11-10 MED ORDER — BUSPIRONE HCL 5 MG PO TABS: ORAL_TABLET | ORAL | 2 refills | Status: DC

## 2021-11-10 MED ORDER — QUETIAPINE FUMARATE ER 50 MG PO TB24: 50.0000 mg | ORAL_TABLET | Freq: Every day | ORAL | 2 refills | Status: DC

## 2021-11-10 NOTE — Progress Notes (Addendum)
BH MD/PA/NP OP Progress Note ? ?11/10/2021 6:48 PM ?Tyler Horn  ?MRN:  [id number] ? ?Chief Complaint:  ?Chief Complaint  ?Patient presents with  ? Medication Management  ?  MED MGMT  ? ?HPI:  ? ?Tyler Horn is a 61 year old male with a past psychiatric history significant for insomnia, major depressive disorder with psychotic features, and generalized anxiety disorder with panic attacks who presents to [facility] for follow-up and medication management.  Language interpretive services were utilized during the encounter due to patient's use of sign language to communicate.  Patient is currently being managed on the following medications: ? ?Buspirone 5 mg 3 times daily ?Gabapentin 200 mg 3 times daily ?Lexapro 20 mg daily ?Seroquel 300 mg at bedtime ?Seroquel XR 50 mg TB24 24-hour tablet daily ?Propranolol 5 mg daily ? ?Patient reports no issues or concerns regarding his current medication regimen.  Patient denies experiencing any major depressive symptoms.  He reports that he is started a new part-time job at [employer].  He reports that the job is good for him and that it keeps him busy and allows him to stay out of his head.  Patient denies experiencing any anxiety and denies any new stressors at this time.  A PHQ-9 screen was performed with the patient scoring a 3.  A GAD-7 screen was also performed with the patient scoring a 3. ? ?Patient is alert and oriented x4, pleasant,, cooperative, and fully engaged in the patient during the encounter.  Patient reports that he is feeling normal and denies experiencing depression.  Patient denies suicidal or homicidal ideations.  He further denies auditory or visual hallucinations and does not appear to be responding to internal/external stimuli.  Patient endorses good sleep and receives on average 7 to 8 hours of sleep each night.  Patient reports that he has been eating regularly and has opted to cooking more rather than  eating out.  Patient endorses eating 3 meals a day as well as some snacking.  Patient denies alcohol consumption, tobacco use, and illicit drug use.  ? ?Visit Diagnosis:  ?  ICD-10-CM   ?1. Generalized anxiety disorder with panic attacks  F41.1 busPIRone (BUSPAR) 5 MG tablet  ? F41.0 gabapentin (NEURONTIN) 100 MG capsule  ?  propranolol (INDERAL) 10 MG tablet  ?  escitalopram (LEXAPRO) 20 MG tablet  ?  QUEtiapine (SEROQUEL) 300 MG tablet  ?  QUEtiapine (SEROQUEL XR) 50 MG TB24 24 hr tablet  ?  ?2. Insomnia due to other mental disorder  F51.05 QUEtiapine (SEROQUEL) 300 MG tablet  ? F99   ?  ?3. Moderate episode of recurrent major depressive disorder (HCC)  F33.1 QUEtiapine (SEROQUEL) 300 MG tablet  ?  ? ? ?Past Psychiatric History:  ?Generalized anxiety disorder ?Major depressive disorder ?Panic disorder ?Visual hallucinations ? ?Past Medical History:  ?Past Medical History:  ?Diagnosis Date  ? Anxiety   ? Chronic hepatitis C (HCC) 04/27/2020  ? Depression   ? HIV (human immunodeficiency virus infection) (HCC)   ?  ?Past Surgical History:  ?Procedure Laterality Date  ? CATARACT EXTRACTION Left   ? ? ?Family Psychiatric History:  ?Family history of psychiatric illness unknown ? ?Family History:  ?Family History  ?Problem Relation Age of Onset  ? Mental illness Mother   ? Bipolar disorder Brother   ? ? ?Social History:  ?Social History  ? ?Socioeconomic History  ? Marital status: Single  ?  Spouse name: Not on file  ?  Number of children: Not on file  ? Years of education: Not on file  ? Highest education level: Not on file  ?Occupational History  ? Not on file  ?Tobacco Use  ? Smoking status: Never  ? Smokeless tobacco: Never  ?Vaping Use  ? Vaping Use: Never used  ?Substance and Sexual Activity  ? Alcohol use: Yes  ?  Comment: occassionally  ? Drug use: No  ? Sexual activity: Yes  ?  Comment: declined condoms  ?Other Topics Concern  ? Not on file  ?Social History Narrative  ? Not on file  ? ?Social Determinants of  Health  ? ?Financial Resource Strain: Not on file  ?Food Insecurity: Not on file  ?Transportation Needs: Not on file  ?Physical Activity: Not on file  ?Stress: Not on file  ?Social Connections: Not on file  ? ? ?Allergies: No Known Allergies ? ?Metabolic Disorder Labs: ?Lab Results  ?Component Value Date  ? HGBA1C 5.2 07/07/2021  ? MPG 102.54 07/07/2021  ? MPG 96.8 05/06/2021  ? ?No results found for: PROLACTIN ?Lab Results  ?Component Value Date  ? CHOL 174 07/07/2021  ? TRIG 95 07/07/2021  ? HDL 32 (L) 07/07/2021  ? CHOLHDL 5.4 07/07/2021  ? VLDL 19 07/07/2021  ? LDLCALC 123 (H) 07/07/2021  ? LDLCALC 90 05/06/2021  ? ?Lab Results  ?Component Value Date  ? TSH 1.015 07/07/2021  ? TSH 1.199 05/06/2021  ? ? ?Therapeutic Level Labs: ?No results found for: LITHIUM ?No results found for: VALPROATE ?No components found for:  CBMZ ? ?Current Medications: ?Current Outpatient Medications  ?Medication Sig Dispense Refill  ? antiseptic oral rinse (BIOTENE) LIQD 15 mLs by Mouth Rinse route as needed for dry mouth.  0  ? bictegravir-emtricitabine-tenofovir AF (BIKTARVY) 50-200-25 MG TABS tablet Take 1 tablet by mouth daily. 30 tablet 5  ? tamsulosin (FLOMAX) 0.4 MG CAPS capsule Take 1 capsule (0.4 mg total) by mouth daily. 30 capsule 0  ? busPIRone (BUSPAR) 5 MG tablet TAKE 1 TABLET(5 MG) BY MOUTH THREE TIMES DAILY 90 tablet 2  ? escitalopram (LEXAPRO) 20 MG tablet Take 1 tablet (20 mg total) by mouth daily. 30 tablet 2  ? gabapentin (NEURONTIN) 100 MG capsule TAKE 2 CAPSULES(200 MG) BY MOUTH THREE TIMES DAILY 180 capsule 2  ? propranolol (INDERAL) 10 MG tablet TAKE 1/2 TABLET(5 MG) BY MOUTH DAILY 45 tablet 2  ? QUEtiapine (SEROQUEL XR) 50 MG TB24 24 hr tablet Take 1 tablet (50 mg total) by mouth daily. 30 tablet 2  ? QUEtiapine (SEROQUEL) 300 MG tablet Take 1 tablet (300 mg total) by mouth at bedtime. 30 tablet 2  ? ?No current facility-administered medications for this visit.  ? ? ? ?Musculoskeletal: ?Strength & Muscle Tone:  within normal limits ?Gait & Station: normal ?Patient leans: N/A ? ?Psychiatric Specialty Exam: ?Review of Systems  ?Psychiatric/Behavioral:  Negative for decreased concentration, dysphoric mood, hallucinations, self-injury, sleep disturbance and suicidal ideas. The patient is not nervous/anxious and is not hyperactive.    ?Blood pressure 130/60, pulse 66, height 6\' 4"  (1.93 m), weight 214 lb (97.1 kg).Body mass index is 26.05 kg/m?.  ?General Appearance: Well Groomed  ?Eye Contact:  Good  ?Speech:  Clear and Coherent and Normal Rate  ?Volume:  Normal  ?Mood:  Euthymic  ?Affect:  Appropriate  ?Thought Process:  Coherent and Descriptions of Associations: Intact  ?Orientation:  Full (Time, Place, and Person)  ?Thought Content: WDL   ?Suicidal Thoughts:  No  ?Homicidal Thoughts:  No  ?Memory:  Immediate;   Good ?Recent;   Fair ?Remote;   Fair  ?Judgement:  Good  ?Insight:  Fair  ?Psychomotor Activity:  Normal  ?Concentration:  Concentration: Good and Attention Span: Good  ?Recall:  Good  ?Fund of Knowledge: Fair  ?Language: Unable to assess due to patient utilizing sign language. Patient has no issues being able to convey his chief complaints and thoughts through sign language.  ?Akathisia:  No  ?Handed:  Right  ?AIMS (if indicated): not done  ?Assets:  Communication Skills ?Desire for Improvement ?Social Support ?Vocational/Educational  ?ADL's:  Intact  ?Cognition: WNL  ?Sleep:  Good  ? ?Screenings: ?AIMS   ? ?Flowsheet Row [facility] Ad[facility] ([facility]) [facility]  ?AIMS Total Score 13 3 0 0  ? ?  ? ?AUDIT   ? ?Flowsheet Row [facility]  [facility] (Dis[facility] Admission (Discharged) from 11/28/18

## 2021-12-01 ENCOUNTER — Other Ambulatory Visit: Payer: Self-pay

## 2021-12-01 ENCOUNTER — Ambulatory Visit: Payer: Self-pay

## 2021-12-01 NOTE — Progress Notes (Unsigned)
Therapist met with client as scheduled and discussed their progress. Therapist used active listening skills to provide client with opportunity to disclose previous challenges and successes since last session. Specific problem-solving skills were processed with client, including breaking down problems, brainstorming, evaluating, and choosing options. At times implementing a plan and evaluating and reevaluating results. Therapist assessed for SI/HI during session and will follow-up with client during the next session.  ? ?Client met with therapist as scheduled and presented alert; orient X4. At the start of session, client affect appeared sullen; affect appeared to be congruent with client report. Client expressed their needs in the development of their treatment goals and described different communities of which can serve as an added support. Client informed clinician that he was having issues with previous friends returning his call and people at work being intimidated to communicate with him due to his disability. Client also informed clinician that his previous issues with his mother is starting to pan out as he will be visiting her Easter weekend. Client progress toward therapeutic goal(s) remain moderate at this time. Client denied SI/HI. ?

## 2021-12-15 ENCOUNTER — Ambulatory Visit (INDEPENDENT_AMBULATORY_CARE_PROVIDER_SITE_OTHER): Payer: Self-pay | Admitting: Family

## 2021-12-15 ENCOUNTER — Encounter: Payer: Self-pay | Admitting: Family

## 2021-12-15 ENCOUNTER — Other Ambulatory Visit: Payer: Self-pay

## 2021-12-15 VITALS — BP 95/64 | HR 64 | Temp 97.5°F | Wt 214.0 lb

## 2021-12-15 DIAGNOSIS — F411 Generalized anxiety disorder: Secondary | ICD-10-CM

## 2021-12-15 DIAGNOSIS — Z21 Asymptomatic human immunodeficiency virus [HIV] infection status: Secondary | ICD-10-CM

## 2021-12-15 DIAGNOSIS — Z113 Encounter for screening for infections with a predominantly sexual mode of transmission: Secondary | ICD-10-CM

## 2021-12-15 DIAGNOSIS — A515 Early syphilis, latent: Secondary | ICD-10-CM

## 2021-12-15 DIAGNOSIS — Z0000 Encounter for general adult medical examination without abnormal findings: Secondary | ICD-10-CM

## 2021-12-15 MED ORDER — BIKTARVY 50-200-25 MG PO TABS: 1.0000 | ORAL_TABLET | Freq: Every day | ORAL | 5 refills | Status: DC

## 2021-12-15 NOTE — Assessment & Plan Note (Signed)
?   Discussed importance of safe sexual practices and condom use.  Condoms offered. ?? Routine dental care up-to-date per recommendations. ?? Declines tetanus vaccination ?

## 2021-12-15 NOTE — Progress Notes (Signed)
? ? ?Brief Narrative  ? ?Patient ID: Tyler Horn, male    DOB: 19-May-1961, 61 y.o.   MRN: [id number] ? ?Tyler Horn is a 61 year old Caucasian male diagnosed with HIV-1 in 2005 with risk factor of MSM.  CD4 nadir and initial viral load are unknown.  [id number] negative. Came to clinic on nevirapine and Truvada and switched to [place].  No history of opportunistic infection. ? ?Subjective:  ?  ?Chief Complaint  ?Patient presents with  ? Follow-up  ? ? ?HPI: ? ?Tyler Horn is a 61 y.o. male with HIV disease last seen on 08/06/2021 with well-controlled virus and good adherence and tolerance to his ART regimen of Biktarvy.  Viral load at the time was undetectable with CD4 count of 974.  RPR titer was positive at 1: 4 down from treatment of 1: 16.  Here today for routine follow-up.  Tyler Horn is hearing impaired and a sign language interpreter is present to aid in communication. ? ?Tyler Horn continues to take his Biktarvy daily as prescribed with no adverse side effects.  Overall feeling well today with no new concerns/complaints. Denies fevers, chills, night sweats, headaches, changes in vision, neck pain/stiffness, nausea, diarrhea, vomiting, lesions or rashes. ? ?Tyler Horn has no problems obtaining medications from the pharmacy and remains covered by [employer].  Denies feelings of being down, depressed, or hopeless recently.  Continues to work with counseling and feels his mental health medications are working adequately.  Drinks alcohol on occasion with no current recreational or illicit drug use or tobacco use.  Condoms offered.  Has started working a part-time job at [employer].  Healthcare maintenance due includes tetanus. ? ? ?No Known Allergies ? ? ? ?Outpatient Medications Prior to Visit  ?Medication Sig Dispense Refill  ? antiseptic oral rinse (BIOTENE) LIQD 15 mLs by Mouth Rinse route as needed for dry mouth.  0  ? busPIRone (BUSPAR) 5 MG tablet TAKE 1 TABLET(5 MG) BY MOUTH THREE TIMES DAILY 90 tablet 2  ?  escitalopram (LEXAPRO) 20 MG tablet Take 1 tablet (20 mg total) by mouth daily. 30 tablet 2  ? gabapentin (NEURONTIN) 100 MG capsule TAKE 2 CAPSULES(200 MG) BY MOUTH THREE TIMES DAILY 180 capsule 2  ? propranolol (INDERAL) 10 MG tablet TAKE 1/2 TABLET(5 MG) BY MOUTH DAILY 45 tablet 2  ? QUEtiapine (SEROQUEL XR) 50 MG TB24 24 hr tablet Take 1 tablet (50 mg total) by mouth daily. 30 tablet 2  ? QUEtiapine (SEROQUEL) 300 MG tablet Take 1 tablet (300 mg total) by mouth at bedtime. 30 tablet 2  ? bictegravir-emtricitabine-tenofovir AF (BIKTARVY) 50-200-25 MG TABS tablet Take 1 tablet by mouth daily. 30 tablet 5  ? tamsulosin (FLOMAX) 0.4 MG CAPS capsule Take 1 capsule (0.4 mg total) by mouth daily. (Patient not taking: Reported on 12/15/2021) 30 capsule 0  ? ?No facility-administered medications prior to visit.  ? ? ? ?Past Medical History:  ?Diagnosis Date  ? Anxiety   ? Chronic hepatitis C (HCC) 04/27/2020  ? Depression   ? HIV (human immunodeficiency virus infection) (HCC)   ? ? ? ?Past Surgical History:  ?Procedure Laterality Date  ? CATARACT EXTRACTION Left   ? ? ? ? ?Review of Systems  ?Constitutional:  Negative for appetite change, chills, fatigue, fever and unexpected weight change.  ?Eyes:  Negative for visual disturbance.  ?Respiratory:  Negative for cough, chest tightness, shortness of breath and wheezing.   ?Cardiovascular:  Negative for chest pain and leg swelling.  ?Gastrointestinal:  Negative  for abdominal pain, constipation, diarrhea, nausea and vomiting.  ?Genitourinary:  Negative for dysuria, flank pain, frequency, genital sores, hematuria and urgency.  ?Skin:  Negative for rash.  ?Allergic/Immunologic: Negative for immunocompromised state.  ?Neurological:  Negative for dizziness and headaches.  ?   ?Objective:  ?  ?BP 95/64   Pulse 64   Temp (!) 97.5 ?F (36.4 ?C) (Oral)   Wt 214 lb (97.1 kg)   BMI 26.05 kg/m?  ?Nursing note and vital signs reviewed. ? ?Physical Exam ?Constitutional:   ?   General:  He is not in acute distress. ?   Appearance: He is well-developed.  ?Eyes:  ?   Conjunctiva/sclera: Conjunctivae normal.  ?Cardiovascular:  ?   Rate and Rhythm: Normal rate and regular rhythm.  ?   Heart sounds: Normal heart sounds. No murmur heard. ?  No friction rub. No gallop.  ?Pulmonary:  ?   Effort: Pulmonary effort is normal. No respiratory distress.  ?   Breath sounds: Normal breath sounds. No wheezing or rales.  ?Chest:  ?   Chest wall: No tenderness.  ?Abdominal:  ?   General: Bowel sounds are normal.  ?   Palpations: Abdomen is soft.  ?   Tenderness: There is no abdominal tenderness.  ?Musculoskeletal:  ?   Cervical back: Neck supple.  ?Lymphadenopathy:  ?   Cervical: No cervical adenopathy.  ?Skin: ?   General: Skin is warm and dry.  ?   Findings: No rash.  ?Neurological:  ?   Mental Status: He is alert and oriented to person, place, and time.  ?Psychiatric:     ?   Behavior: Behavior normal.     ?   Thought Content: Thought content normal.     ?   Judgment: Judgment normal.  ? ? ? ? ?  12/15/2021  ?  9:54 AM 11/10/2021  ?  1:14 PM 09/11/2021  ?  3:35 PM 08/06/2021  ? 10:13 AM 08/04/2021  ?  3:11 PM  ?Depression screen PHQ 2/9  ?Decreased Interest 0   0   ?Down, Depressed, Hopeless 0   0   ?PHQ - 2 Score 0   0   ?Altered sleeping       ?Tired, decreased energy       ?Change in appetite       ?Feeling bad or failure about yourself        ?Trouble concentrating       ?Moving slowly or fidgety/restless       ?Suicidal thoughts       ?PHQ-9 Score       ?Difficult doing work/chores       ?  ? Information is confidential and restricted. Go to Review Flowsheets to unlock data.  ?  ?   ?Assessment & Plan:  ? ? ?Patient Active Problem List  ? Diagnosis Date Noted  ? GAD (generalized anxiety disorder) 07/14/2021  ? MDD (major depressive disorder) 05/05/2021  ? Insomnia due to other mental disorder 02/19/2021  ? Panic disorder 01/07/2021  ? Severe episode of recurrent major depressive disorder, with psychotic features  (HCC) 10/16/2020  ? Vision changes 07/09/2020  ? Weight loss 07/09/2020  ? Idiopathic peripheral neuropathy 04/27/2020  ? Herpes simplex 04/27/2020  ? Herpes zoster 04/27/2020  ? Healthcare maintenance 12/19/2019  ? 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 Post-traumatic stress disorder, unspecified 11/28/2019  ? 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 Major depressive disorder, recurrent,  severe with psychotic features (HCC) 07/13/2017  ? MDD (major depressive disorder), recurrent, severe, with psychosis (HCC) 07/31/2014  ? Benzodiazepine misuse 07/31/2014  ? Human immunodeficiency virus infection (HCC)   ? Delusional disorder (HCC) 07/30/2014  ? Suicidal thoughts 07/30/2014  ? Visual hallucinations   ? Moderate episode of recurrent major depressive disorder (HCC) 10/03/2013  ? Generalized anxiety disorder with panic attacks 03/26/2013  ? ? ? ?Problem List Items Addressed This Visit   ? ?  ? Other  ? Human immunodeficiency virus infection (HCC) - Primary  ?  Tyler Horn continues to have well-controlled virus with good adherence and tolerance to his ART regimen of Biktarvy.  We reviewed lab work and discussed plan of care.  Check blood work today.  Continue current dose of Biktarvy.  Plan for follow-up in 4 months or sooner if needed with lab work on the same day. ? ?  ?  ? Relevant Medications  ? bictegravir-emtricitabine-tenofovir AF (BIKTARVY) 50-200-25 MG TABS tablet  ? Other Relevant Orders  ? Comprehensive metabolic panel  ? HIV-1 RNA quant-no reflex-bld  ? T-helper cell (CD4)- ([facility] clinic only)  ? Healthcare maintenance  ?  Discussed importance of safe sexual practices and condom use.  Condoms offered. ?Routine dental care up-to-date per recommendations. ?Declines tetanus vaccination ?  ?  ? Early syphilis, latent  ?  Previous treated at 1: 16 with most recent titer of 1: 4.  Recheck RPR.  No current symptoms. ? ?  ?  ? Relevant Medications  ?  bictegravir-emtricitabine-tenofovir AF (BIKTARVY) 50-200-25 MG TABS tablet  ? GAD (generalized anxiety disorder)  ?  Tyler Horn is generalized anxiety and depression are well controlled with current dose of Sero

## 2021-12-15 NOTE — Assessment & Plan Note (Signed)
Previous treated at 1: 16 with most recent titer of 1: 4.  Recheck RPR.  No current symptoms. ?

## 2021-12-15 NOTE — Assessment & Plan Note (Signed)
Mr. Achille continues to have well-controlled virus with good adherence and tolerance to his ART regimen of Biktarvy.  We reviewed lab work and discussed plan of care.  Check blood work today.  Continue current dose of Biktarvy.  Plan for follow-up in 4 months or sooner if needed with lab work on the same day. ?

## 2021-12-15 NOTE — Assessment & Plan Note (Signed)
Tyler Horn is generalized anxiety and depression are well controlled with current dose of Seroquel, Lexapro, and BuSpar.  Continue medications with changes per psychiatry. ?

## 2021-12-15 NOTE — Patient Instructions (Signed)
Nice to see you.  We will check your lab work today.  Continue to take your medication daily as prescribed.  Refills have been sent to the pharmacy.  Plan for follow up in 4 months or sooner if needed with lab work on the same day.  Have a great day and stay safe!  

## 2021-12-17 LAB — HELPER T-LYMPH-CD4 (ARMC ONLY)
% CD 4 Pos. Lymph.: 31.8 % (ref 30.8–58.5)
Absolute CD 4 Helper: 700 /uL (ref 359–1519)
Basophils Absolute: 0.1 10*3/uL (ref 0.0–0.2)
Basos: 1 %
EOS (ABSOLUTE): 0.3 10*3/uL (ref 0.0–0.4)
Eos: 4 %
Hematocrit: 46.2 % (ref 37.5–51.0)
Hemoglobin: 14.9 g/dL (ref 13.0–17.7)
Immature Grans (Abs): 0 10*3/uL (ref 0.0–0.1)
Immature Granulocytes: 0 %
Lymphocytes Absolute: 2.2 10*3/uL (ref 0.7–3.1)
Lymphs: 36 %
MCH: 29.9 pg (ref 26.6–33.0)
MCHC: 32.3 g/dL (ref 31.5–35.7)
MCV: 93 fL (ref 79–97)
Monocytes Absolute: 0.4 10*3/uL (ref 0.1–0.9)
Monocytes: 7 %
Neutrophils Absolute: 3.2 10*3/uL (ref 1.4–7.0)
Neutrophils: 52 %
Platelets: 304 10*3/uL (ref 150–450)
RBC: 4.98 x10E6/uL (ref 4.14–5.80)
RDW: 12.8 % (ref 11.6–15.4)
WBC: 6.2 10*3/uL (ref 3.4–10.8)

## 2021-12-19 LAB — FLUORESCENT TREPONEMAL AB(FTA)-IGG-BLD: Fluorescent Treponemal ABS: REACTIVE — AB

## 2021-12-19 LAB — COMPREHENSIVE METABOLIC PANEL
AG Ratio: 1.3 (calc) (ref 1.0–2.5)
ALT: 10 U/L (ref 9–46)
AST: 14 U/L (ref 10–35)
Albumin: 4.1 g/dL (ref 3.6–5.1)
Alkaline phosphatase (APISO): 101 U/L (ref 35–144)
BUN: 18 mg/dL (ref 7–25)
CO2: 28 mmol/L (ref 20–32)
Calcium: 9.6 mg/dL (ref 8.6–10.3)
Chloride: 103 mmol/L (ref 98–110)
Creat: 1.15 mg/dL (ref 0.70–1.35)
Globulin: 3.1 g/dL (calc) (ref 1.9–3.7)
Glucose, Bld: 86 mg/dL (ref 65–99)
Potassium: 4.4 mmol/L (ref 3.5–5.3)
Sodium: 139 mmol/L (ref 135–146)
Total Bilirubin: 0.6 mg/dL (ref 0.2–1.2)
Total Protein: 7.2 g/dL (ref 6.1–8.1)

## 2021-12-19 LAB — HIV-1 RNA QUANT-NO REFLEX-BLD
HIV 1 RNA Quant: NOT DETECTED copies/mL
HIV-1 RNA Quant, Log: NOT DETECTED Log copies/mL

## 2021-12-19 LAB — RPR TITER: RPR Titer: 1:2 {titer} — ABNORMAL HIGH

## 2021-12-19 LAB — RPR: RPR Ser Ql: REACTIVE — AB

## 2021-12-22 LAB — T-HELPER CELL (CD4) - (RCID CLINIC ONLY)

## 2022-01-05 ENCOUNTER — Ambulatory Visit: Payer: Self-pay

## 2022-01-05 ENCOUNTER — Other Ambulatory Visit: Payer: Self-pay

## 2022-01-05 NOTE — Progress Notes (Unsigned)
Therapist met with client as scheduled and discussed their progress. Therapist used active listening skills to provide client with opportunity to disclose previous challenges and successes since last session. Specific problem-solving skills were processed with client, including breaking down problems, brainstorming, evaluating, and choosing options. At times implementing a plan and evaluating and reevaluating results. Therapist assessed for SI/HI during session and will follow-up with client during the next session.  ? ?Client met with therapist as scheduled and presented alert; orient X4. At the start of session, client affect appeared euthymic; affect appeared to be congruent with client report. Client expressed their needs in the development of their treatment goals and described different communities that could serve as a support. Client attended an event for the deaf and was able to mingle with others with the same disability. Client reports that he and his mother has made amends; visiting her during the Easter Holiday. Client also talked about his upcoming trip to [place]; client admitted that he would have to save for this trip.  Client progress toward therapeutic goal(s) remain moderate at this time. Client denied SI/HI. ?

## 2022-02-02 ENCOUNTER — Ambulatory Visit: Payer: Self-pay

## 2022-02-09 ENCOUNTER — Other Ambulatory Visit (HOSPITAL_COMMUNITY): Payer: Self-pay | Admitting: Physician Assistant

## 2022-02-09 ENCOUNTER — Ambulatory Visit (INDEPENDENT_AMBULATORY_CARE_PROVIDER_SITE_OTHER): Payer: No Payment, Other | Admitting: Physician Assistant

## 2022-02-09 DIAGNOSIS — F410 Panic disorder [episodic paroxysmal anxiety] without agoraphobia: Secondary | ICD-10-CM

## 2022-02-09 DIAGNOSIS — F411 Generalized anxiety disorder: Secondary | ICD-10-CM

## 2022-02-09 DIAGNOSIS — F331 Major depressive disorder, recurrent, moderate: Secondary | ICD-10-CM

## 2022-02-09 DIAGNOSIS — F5105 Insomnia due to other mental disorder: Secondary | ICD-10-CM

## 2022-02-09 DIAGNOSIS — F99 Mental disorder, not otherwise specified: Secondary | ICD-10-CM

## 2022-02-09 MED ORDER — QUETIAPINE FUMARATE ER 50 MG PO TB24
50.0000 mg | ORAL_TABLET | Freq: Every day | ORAL | 3 refills | Status: DC
Start: 2022-02-09 — End: 2022-05-27

## 2022-02-09 MED ORDER — ESCITALOPRAM OXALATE 20 MG PO TABS: 20.0000 mg | ORAL_TABLET | Freq: Every day | ORAL | 3 refills | Status: DC

## 2022-02-09 MED ORDER — QUETIAPINE FUMARATE 300 MG PO TABS: 300.0000 mg | ORAL_TABLET | Freq: Every day | ORAL | 3 refills | Status: DC

## 2022-02-09 MED ORDER — BUSPIRONE HCL 5 MG PO TABS: ORAL_TABLET | ORAL | 3 refills | Status: DC

## 2022-02-09 MED ORDER — GABAPENTIN 100 MG PO CAPS: ORAL_CAPSULE | ORAL | 3 refills | Status: DC

## 2022-02-09 MED ORDER — PROPRANOLOL HCL 10 MG PO TABS: ORAL_TABLET | ORAL | 1 refills | Status: DC

## 2022-02-09 NOTE — Progress Notes (Cosign Needed)
[facility] MD/PA/NP OP Progress Note  02/09/2022 6:09 PM Tyler Horn  MRN:  [id number]  Chief Complaint:  No chief complaint on file.  HPI:   Tyler Horn is a 61 year old male with a past psychiatric history significant for insomnia, major depressive disorder with psychotic features, and generalized anxiety disorder with panic attacks who presents to [facility] for follow-up and medication management.  Language interpretive services were utilized during the encounter due to patient's use of sign language to communicate.  Patient is currently being managed on the following medications:  Buspirone 5 mg 3 times daily Gabapentin 200 mg 3 times daily Lexapro 20 mg daily Seroquel 300 mg at bedtime Seroquel XR 50 mg TB24 24-hour tablet daily Propranolol 5 mg daily  Patient reports that he is doing fine and well.  He states that life is going good and that he is stable while taking his medications.  Patient denies experiencing any adverse side effects from taking his current medication regimen.  Patient further denies depressive symptoms or anxiety.  He does report that he has noticed that his right hand and arm shake on occasion.  He states that he can easily feel this phenomenon when holding onto items.  Patient denies this being a major issue at this point in time.  Since being on his medication regimen, patient states that he is more social and has been taking out more time to see friends.  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  Lastly, patient reports that he was recently let go of his last position for unknown reasons.  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  Patient denies any other major issues regarding his mental health.  A GAD-7 screen was performed with the patient scoring a 3.  Patient is alert and oriented x4, pleasant,, cooperative, and fully engaged in the patient during  the encounter.  Patient states that he is hopeful that he will get a new job soon.  Patient denies suicidal or homicidal ideations.  He further denies auditory or visual hallucinations and does not appear to be responding to internal/external stimuli.  Patient endorses good sleep and receives on average 8 to 10 hours of sleep each night.  Patient endorses good appetite and eats on average 3-4 meals a day as well as drinking plenty of water.  Patient denies alcohol consumption, tobacco use, and illicit drug use.  Visit Diagnosis:    ICD-10-CM   1. Generalized anxiety disorder with panic attacks  F41.1 QUEtiapine (SEROQUEL XR) 50 MG TB24 24 hr tablet   F41.0 QUEtiapine (SEROQUEL) 300 MG tablet    escitalopram (LEXAPRO) 20 MG tablet    busPIRone (BUSPAR) 5 MG tablet    gabapentin (NEURONTIN) 100 MG capsule    propranolol (INDERAL) 10 MG tablet    2. Insomnia due to other mental disorder  F51.05 QUEtiapine (SEROQUEL) 300 MG tablet   F99     3. Moderate episode of recurrent major depressive disorder (HCC)  F33.1 QUEtiapine (SEROQUEL) 300 MG tablet      Past Psychiatric History:  Generalized anxiety disorder Major depressive disorder Panic disorder Visual hallucinations  Past Medical History:  Past Medical History:  Diagnosis Date   Anxiety    Chronic hepatitis C ([place]) 04/27/2020   Depression    HIV (human immunodeficiency virus infection) ([place])     Past Surgical History:  Procedure Laterality Date   CATARACT EXTRACTION  Left     Family Psychiatric History:  Family history of psychiatric illness unknown  Family History:  Family History  Problem Relation Age of Onset   Mental illness Mother    Bipolar disorder Brother     Social History:  Social History   Socioeconomic History   Marital status: Single    Spouse name: Not on file   Number of children: Not on file   Years of education: Not on file   Highest education level: Not on file  Occupational History   Not on  file  Tobacco Use   Smoking status: Never   Smokeless tobacco: Never  Vaping Use   Vaping Use: Never used  Substance and Sexual Activity   Alcohol use: Yes    Comment: occassionally   Drug use: No   Sexual activity: Yes    Comment: declined condoms  Other Topics Concern   Not on file  Social History Narrative   Not on file   Social Determinants of Health   Financial Resource Strain: Not on file  Food Insecurity: Not on file  Transportation Needs: Not on file  Physical Activity: Not on file  Stress: Not on file  Social Connections: Not on file    Allergies: No Known Allergies  Metabolic Disorder Labs: Lab Results  Component Value Date   HGBA1C 5.2 07/07/2021   MPG 102.54 07/07/2021   MPG 96.8 05/06/2021   No results found for: "PROLACTIN" Lab Results  Component Value Date   CHOL 174 07/07/2021   TRIG 95 07/07/2021   HDL 32 (L) 07/07/2021   CHOLHDL 5.4 07/07/2021   VLDL 19 07/07/2021   LDLCALC 123 (H) 07/07/2021   [place] 90 05/06/2021   Lab Results  Component Value Date   TSH 1.015 07/07/2021   TSH 1.199 05/06/2021    Therapeutic Level Labs: No results found for: "LITHIUM" No results found for: "VALPROATE" No results found for: "CBMZ"  Current Medications: Current Outpatient Medications  Medication Sig Dispense Refill   antiseptic oral rinse (BIOTENE) LIQD 15 mLs by Mouth Rinse route as needed for dry mouth.  0   bictegravir-emtricitabine-tenofovir AF (BIKTARVY) 50-200-25 MG TABS tablet Take 1 tablet by mouth daily. 30 tablet 5   busPIRone (BUSPAR) 5 MG tablet TAKE 1 TABLET(5 MG) BY MOUTH THREE TIMES DAILY 90 tablet 3   escitalopram (LEXAPRO) 20 MG tablet Take 1 tablet (20 mg total) by mouth daily. 30 tablet 3   gabapentin (NEURONTIN) 100 MG capsule TAKE 2 CAPSULES(200 MG) BY MOUTH THREE TIMES DAILY 180 capsule 3   propranolol (INDERAL) 10 MG tablet TAKE 1/2 TABLET(5 MG) BY MOUTH DAILY 45 tablet 1   QUEtiapine (SEROQUEL XR) 50 MG TB24 24 hr tablet  Take 1 tablet (50 mg total) by mouth daily. 30 tablet 3   QUEtiapine (SEROQUEL) 300 MG tablet Take 1 tablet (300 mg total) by mouth at bedtime. 30 tablet 3   tamsulosin (FLOMAX) 0.4 MG CAPS capsule Take 1 capsule (0.4 mg total) by mouth daily. (Patient not taking: Reported on 12/15/2021) 30 capsule 0   No current facility-administered medications for this visit.     Musculoskeletal: Strength & Muscle Tone: within normal limits Gait & Station: normal Patient leans: N/A  Psychiatric Specialty Exam: Review of Systems  Psychiatric/Behavioral:  Negative for decreased concentration, dysphoric mood, hallucinations, self-injury, sleep disturbance and suicidal ideas. The patient is not nervous/anxious and is not hyperactive.     There were no vitals taken for this visit.There is no height  or weight on file to calculate BMI.  General Appearance: Well Groomed  Eye Contact:  Good  Speech:  Clear and Coherent and Normal Rate  Volume:  Normal  Mood:  Euthymic  Affect:  Appropriate  Thought Process:  Coherent and Descriptions of Associations: Intact  Orientation:  Full (Time, Place, and Person)  Thought Content: WDL   Suicidal Thoughts:  No  Homicidal Thoughts:  No  Memory:  Immediate;   Good Recent;   Fair Remote;   Fair  Judgement:  Good  Insight:  Fair  Psychomotor Activity:  Normal  Concentration:  Concentration: Good and Attention Span: Good  Recall:  Good  Fund of Knowledge: Fair  Language: Unable to assess due to patient utilizing sign language. Patient has no issues being able to convey his chief complaints and thoughts through sign language.  Akathisia:  No  Handed:  Right  AIMS (if indicated): not done  Assets:  Communication Skills Desire for Improvement Social Support Vocational/Educational  ADL's:  Intact  Cognition: WNL  Sleep:  Good   Screenings: AIMS    Flowsheet Row Admission (Discharged) from 07/07/2021 in [place] [facility] Admission  (Discharged) from 05/05/2021 in [place] [facility] Admission (Discharged) from 10/16/2020 in [place] [facility] Admission (Discharged) from 07/13/2017 in [place] [facility]  AIMS Total Score 13 3 0 0      AUDIT    Flowsheet Row Admission (Discharged) from 07/07/2021 in [place] [facility] Admission (Discharged) from 05/05/2021 in [place] [facility] Admission (Discharged) from 10/16/2020 in [place] [facility] Admission (Discharged) from 11/28/2019 in [place] Admission (Discharged) from 07/13/2017 in [place] [facility]  Alcohol Use Disorder Identification Test Final Score (AUDIT) 0 0 0 0 0      GAD-7    Flowsheet Row Office Visit from 02/09/2022 in [facility] Office Visit from 11/10/2021 in [facility] Video Visit from 09/11/2021 in [facility] Office Visit from 08/04/2021 in [facility] Office Visit from 06/09/2021 in [facility]  Total GAD-7 Score 3 3 4 8 16       PHQ2-9    [place] Office Visit from 02/09/2022 in [facility] Office Visit from 12/15/2021 in [facility] Office Visit from 11/10/2021 in [facility] Video Visit from 09/11/2021 in [facility] Office Visit from 08/06/2021 in [facility] for Infectious Disease  PHQ-2 Total Score 0 0 2 1 0  PHQ-9 Total Score -- -- 3 -- --      [place] Visit from 02/09/2022 in [facility] Office Visit from 11/10/2021 in [facility] Video Visit from 09/11/2021 in [place] No Risk No Risk No Risk        Assessment and Plan:   Tyler Horn is a 61 year old male with a past psychiatric history significant for insomnia, major depressive disorder with psychotic features, and generalized anxiety disorder with panic attacks who presents to [facility] for follow-up and medication management.  Language interpretive services were utilized during the encounter due to patient's use of sign language to communicate.  Patient denies any issues or concerns regarding  his current medication regimen.  Patient denies depressive symptoms or anxiety and appears stable on his current medication regimen.  Patient denies the need for dosage adjustments at this time.  Patient's medications to be e-prescribed to pharmacy of choice.  Collaboration of Care: Collaboration of Care: Medication Management AEB provider managing patient's psychiatric medications, Psychiatrist AEB patient being followed by a mental health provider, and Other provider involved in patient's care AEB patient being followed by infectious diseases  Patient/Guardian was advised Release of Information must be obtained prior to any record release in order to collaborate their care with an outside provider. Patient/Guardian was advised if they have not already done so to contact the registration department to sign all necessary forms in order for [place] to release information regarding their care.   Consent: Patient/Guardian gives verbal consent for treatment and assignment of benefits for services provided during this visit. Patient/Guardian expressed understanding and agreed to proceed.   1. Generalized anxiety disorder with panic attacks  - QUEtiapine (SEROQUEL XR) 50 MG TB24 24 hr tablet; Take 1 tablet (50 mg total) by mouth daily.  Dispense: 30 tablet; Refill: 3 - QUEtiapine (SEROQUEL) 300 MG tablet; Take 1 tablet (300 mg total) by mouth at bedtime.  Dispense:  30 tablet; Refill: 3 - escitalopram (LEXAPRO) 20 MG tablet; Take 1 tablet (20 mg total) by mouth daily.  Dispense: 30 tablet; Refill: 3 - busPIRone (BUSPAR) 5 MG tablet; TAKE 1 TABLET(5 MG) BY MOUTH THREE TIMES DAILY  Dispense: 90 tablet; Refill: 3 - gabapentin (NEURONTIN) 100 MG capsule; TAKE 2 CAPSULES(200 MG) BY MOUTH THREE TIMES DAILY  Dispense: 180 capsule; Refill: 3 - propranolol (INDERAL) 10 MG tablet; TAKE 1/2 TABLET(5 MG) BY MOUTH DAILY  Dispense: 45 tablet; Refill: 1  2. Insomnia due to other mental disorder  - QUEtiapine (SEROQUEL) 300 MG tablet; Take 1 tablet (300 mg total) by mouth at bedtime.  Dispense: 30 tablet; Refill: 3  3. Moderate episode of recurrent major depressive disorder (HCC)  - QUEtiapine (SEROQUEL) 300 MG tablet; Take 1 tablet (300 mg total) by mouth at bedtime.  Dispense: 30 tablet; Refill: 3  Patient to follow up in 3 months Provider spent a total of 16 minutes with the patient/reviewing patient's chart  Malachy Mood, PA 02/09/2022, 6:09 PM

## 2022-02-11 ENCOUNTER — Encounter (HOSPITAL_COMMUNITY): Payer: Self-pay | Admitting: Physician Assistant

## 2022-02-28 ENCOUNTER — Other Ambulatory Visit (HOSPITAL_COMMUNITY): Payer: Self-pay | Admitting: Physician Assistant

## 2022-02-28 DIAGNOSIS — F410 Panic disorder [episodic paroxysmal anxiety] without agoraphobia: Secondary | ICD-10-CM

## 2022-03-30 ENCOUNTER — Encounter: Payer: Self-pay | Admitting: Family

## 2022-04-13 ENCOUNTER — Ambulatory Visit: Payer: Self-pay

## 2022-04-13 ENCOUNTER — Ambulatory Visit: Payer: Self-pay | Admitting: Family

## 2022-04-13 NOTE — Progress Notes (Deleted)
NCNS

## 2022-04-20 ENCOUNTER — Other Ambulatory Visit: Payer: Self-pay | Admitting: Family

## 2022-04-20 ENCOUNTER — Ambulatory Visit: Payer: Self-pay

## 2022-04-20 DIAGNOSIS — Z21 Asymptomatic human immunodeficiency virus [HIV] infection status: Secondary | ICD-10-CM

## 2022-04-23 ENCOUNTER — Other Ambulatory Visit: Payer: Self-pay

## 2022-04-23 ENCOUNTER — Ambulatory Visit (INDEPENDENT_AMBULATORY_CARE_PROVIDER_SITE_OTHER): Payer: Self-pay | Admitting: Family

## 2022-04-23 ENCOUNTER — Encounter: Payer: Self-pay | Admitting: Family

## 2022-04-23 VITALS — BP 113/74 | HR 64 | Temp 97.4°F | Ht 76.0 in | Wt 214.0 lb

## 2022-04-23 DIAGNOSIS — Z21 Asymptomatic human immunodeficiency virus [HIV] infection status: Secondary | ICD-10-CM

## 2022-04-23 DIAGNOSIS — F3342 Major depressive disorder, recurrent, in full remission: Secondary | ICD-10-CM

## 2022-04-23 DIAGNOSIS — Z113 Encounter for screening for infections with a predominantly sexual mode of transmission: Secondary | ICD-10-CM

## 2022-04-23 DIAGNOSIS — Z23 Encounter for immunization: Secondary | ICD-10-CM

## 2022-04-23 DIAGNOSIS — Z0000 Encounter for general adult medical examination without abnormal findings: Secondary | ICD-10-CM

## 2022-04-23 MED ORDER — BIKTARVY 50-200-25 MG PO TABS: 1.0000 | ORAL_TABLET | Freq: Every day | ORAL | 6 refills | Status: DC

## 2022-04-23 NOTE — Patient Instructions (Addendum)
Nice to see you. ? ?We will check your lab work today. ? ?Continue to take your medication daily as prescribed. ? ?Refills have been sent to the pharmacy. ? ?Plan for follow up in 6 months or sooner if needed with lab work on the same day. ? ?Have a great day and stay safe! ? ?

## 2022-04-23 NOTE — Assessment & Plan Note (Signed)
   Discussed importance of safe sexual practice and condom use. Condoms and STD testing offered.  Tetanus updated.  Shingrix prescription at next office visit pending coverage.

## 2022-04-23 NOTE — Assessment & Plan Note (Signed)
Tyler Horn has well controlled depression and anxiety with current medication regimen and counseling. No suicidal ideations. Continue current medications with changes per psychiatry as indicated.

## 2022-04-23 NOTE — Progress Notes (Signed)
Brief Narrative   Patient ID: Tyler Horn, male    DOB: Mar 05, 1961, 61 y.o.   MRN: [id number]  Tyler Horn is a 61 year old Caucasian male diagnosed with HIV-1 in 2005 with risk factor of MSM.  CD4 nadir and initial viral load are unknown.  [id number] negative. Came to clinic on nevirapine and Truvada and switched to Tatums.  No history of opportunistic infection.  Subjective:    Chief Complaint  Patient presents with   Follow-up    HPI:  Tyler Horn is a 61 y.o. male with HIV disease last seen on 12/15/21 with well controlled virus and good adherence and tolerance to his ART regimen of Biktarvy. Viral load was undetectable and CD4 count 700. Kidney function, liver function and electrolytes within normal ranges. Tyler Horn is hearing impaired and a medical interpreter is present to aid in communication. Here today for routine follow up.  Tyler Horn has been doing well and taking his medication as prescribed with no adverse side effects or problems obtaining medication. Feeling well today with no new concerns/complaints. 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 Denies fevers, chills, night sweats, headaches, changes in vision, neck pain/stiffness, nausea, diarrhea, vomiting, lesions or rashes. Condoms offered. Healthcare maintenance due includes tetanus and Shingrix.   No Known Allergies    Outpatient Medications Prior to Visit  Medication Sig Dispense Refill   antiseptic oral rinse (BIOTENE) LIQD 15 mLs by Mouth Rinse route as needed for dry mouth.  0   busPIRone (BUSPAR) 5 MG tablet TAKE 1 TABLET(5 MG) BY MOUTH THREE TIMES DAILY 90 tablet 3   escitalopram (LEXAPRO) 20 MG tablet Take 1 tablet (20 mg total) by mouth daily. 30 tablet 3   gabapentin (NEURONTIN) 100 MG capsule TAKE 2 CAPSULES(200 MG) BY MOUTH THREE TIMES DAILY 180 capsule 3   propranolol (INDERAL) 10 MG tablet TAKE 1/2 TABLET(5 MG) BY MOUTH DAILY 45 tablet 1   QUEtiapine (SEROQUEL XR) 50 MG TB24 24 hr  tablet Take 1 tablet (50 mg total) by mouth daily. 30 tablet 3   QUEtiapine (SEROQUEL) 300 MG tablet Take 1 tablet (300 mg total) by mouth at bedtime. 30 tablet 3   BIKTARVY 50-200-25 MG TABS tablet TAKE 1 TABLET BY MOUTH DAILY 30 tablet 0   tamsulosin (FLOMAX) 0.4 MG CAPS capsule Take 1 capsule (0.4 mg total) by mouth daily. (Patient not taking: Reported on 12/15/2021) 30 capsule 0   No facility-administered medications prior to visit.     Past Medical History:  Diagnosis Date   Anxiety    Chronic hepatitis C (HCC) 04/27/2020   Depression    HIV (human immunodeficiency virus infection) (HCC)      Past Surgical History:  Procedure Laterality Date   CATARACT EXTRACTION Left       Review of Systems  Constitutional:  Negative for appetite change, chills, fatigue, fever and unexpected weight change.  Eyes:  Negative for visual disturbance.  Respiratory:  Negative for cough, chest tightness, shortness of breath and wheezing.   Cardiovascular:  Negative for chest pain and leg swelling.  Gastrointestinal:  Negative for abdominal pain, constipation, diarrhea, nausea and vomiting.  Genitourinary:  Negative for dysuria, flank pain, frequency, genital sores, hematuria and urgency.  Skin:  Negative for rash.  Allergic/Immunologic: Negative for immunocompromised state.  Neurological:  Negative for dizziness and headaches.      Objective:    BP 113/74   Pulse 64   Temp (!) 97.4 F (36.3 C) (Oral)  Ht 6\' 4"  (1.93 m)   Wt 214 lb (97.1 kg)   SpO2 97%   BMI 26.05 kg/m  Nursing note and vital signs reviewed.  Physical Exam Constitutional:      General: He is not in acute distress.    Appearance: He is well-developed.  Eyes:     Conjunctiva/sclera: Conjunctivae normal.  Cardiovascular:     Rate and Rhythm: Normal rate and regular rhythm.     Heart sounds: Normal heart sounds. No murmur heard.    No friction rub. No gallop.  Pulmonary:     Effort: Pulmonary effort is normal.  No respiratory distress.     Breath sounds: Normal breath sounds. No wheezing or rales.  Chest:     Chest wall: No tenderness.  Abdominal:     General: Bowel sounds are normal.     Palpations: Abdomen is soft.     Tenderness: There is no abdominal tenderness.  Musculoskeletal:     Cervical back: Neck supple.  Lymphadenopathy:     Cervical: No cervical adenopathy.  Skin:    General: Skin is warm and dry.     Findings: No rash.  Neurological:     Mental Status: He is alert and oriented to person, place, and time.  Psychiatric:        Behavior: Behavior normal.        Thought Content: Thought content normal.        Judgment: Judgment normal.         04/23/2022   10:51 AM 02/09/2022    2:15 PM 12/15/2021    9:54 AM 11/10/2021    1:14 PM 09/11/2021    3:35 PM  Depression screen PHQ 2/9  Decreased Interest 0  0    Down, Depressed, Hopeless 0  0    PHQ - 2 Score 0  0    Altered sleeping       Tired, decreased energy       Change in appetite       Feeling bad or failure about yourself        Trouble concentrating       Moving slowly or fidgety/restless       Suicidal thoughts       PHQ-9 Score       Difficult doing work/chores          Information is confidential and restricted. Go to Review Flowsheets to unlock data.       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   Post-traumatic stress disorder, unspecified 11/28/2019   Sedative, 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   Major depressive disorder, recurrent, severe with psychotic features (HCC) 07/13/2017   MDD (major depressive disorder), recurrent, severe, with psychosis (HCC) 07/31/2014   Benzodiazepine misuse 07/31/2014   Human immunodeficiency virus infection (HCC)    Delusional disorder (HCC) 07/30/2014   Suicidal thoughts 07/30/2014   Visual hallucinations    Moderate episode of recurrent major  depressive disorder (HCC) 10/03/2013   Generalized anxiety disorder with panic attacks 03/26/2013     Problem List Items Addressed This Visit       Other   Human immunodeficiency virus infection [facility])    Tyler Horn continues to have well controlled virus with good adherence and tolerance to Biktarvy. Reviewed previous lab work and discussed plan of care. Check lab work today. Continue current dose of Biktarvy. Plan for follow up in 6 months or sooner if needed with lab work on the same day.       Relevant Medications   bictegravir-emtricitabine-tenofovir AF (BIKTARVY) 50-200-25 MG TABS tablet   Other Relevant Orders   HIV-1 RNA quant-no reflex-bld   T-helper cells (CD4) count (not at [facility])   Comprehensive metabolic panel   Healthcare maintenance - Primary    Discussed importance of safe sexual practice and condom use. Condoms and STD testing offered. Tetanus updated. Shingrix prescription at next office visit pending coverage.       MDD (major depressive disorder)    Tyler Horn has well controlled depression and anxiety with current medication regimen and counseling. No suicidal ideations. Continue current medications with changes per psychiatry as indicated.       Other Visit Diagnoses     Screening for STDs (sexually transmitted diseases)       Relevant Orders   RPR   Need for tetanus, diphtheria, and acellular pertussis (Tdap) vaccine       Relevant Orders   Tdap vaccine greater than or equal to 7yo IM  (Completed)        I have changed [employer]. I am also having him maintain his antiseptic oral rinse, tamsulosin, QUEtiapine, QUEtiapine, escitalopram, gabapentin, propranolol, and busPIRone.   Meds ordered this encounter  Medications   bictegravir-emtricitabine-tenofovir AF (BIKTARVY) 50-200-25 MG TABS tablet    Sig: Take 1 tablet by mouth daily.    Dispense:  30 tablet    Refill:  6    Order Specific Question:   Supervising Provider    Answer:   Judyann Munson [[id number]]     Follow-up: Return in about 6 months (around 10/24/2022), or if symptoms worsen or fail to improve.   Marcos Eke, MSN, FNP-C Nurse Practitioner [facility] for Infectious Disease [facility] Main number: [phone]

## 2022-04-23 NOTE — Assessment & Plan Note (Signed)
Mr. Utz continues to have well controlled virus with good adherence and tolerance to [employer]. Reviewed previous lab work and discussed plan of care. Check lab work today. Continue current dose of Biktarvy. Plan for follow up in 6 months or sooner if needed with lab work on the same day.

## 2022-04-27 ENCOUNTER — Ambulatory Visit: Payer: Self-pay

## 2022-04-27 ENCOUNTER — Other Ambulatory Visit: Payer: Self-pay

## 2022-04-27 LAB — RPR: RPR Ser Ql: NONREACTIVE

## 2022-04-27 LAB — HIV-1 RNA QUANT-NO REFLEX-BLD
HIV 1 RNA Quant: <20 Copies/mL — ABNORMAL HIGH
HIV-1 RNA Quant, Log: <1.3 Log cps/mL — ABNORMAL HIGH

## 2022-04-27 LAB — COMPREHENSIVE METABOLIC PANEL
AG Ratio: 1.1 (calc) (ref 1.0–2.5)
ALT: 11 U/L (ref 9–46)
AST: 13 U/L (ref 10–35)
Albumin: 3.8 g/dL (ref 3.6–5.1)
Alkaline phosphatase (APISO): 109 U/L (ref 35–144)
BUN/Creatinine Ratio: 14 (calc) (ref 6–22)
BUN: 19 mg/dL (ref 7–25)
CO2: 25 mmol/L (ref 20–32)
Calcium: 9.2 mg/dL (ref 8.6–10.3)
Chloride: 105 mmol/L (ref 98–110)
Creat: 1.37 mg/dL — ABNORMAL HIGH (ref 0.70–1.35)
Globulin: 3.6 g/dL (calc) (ref 1.9–3.7)
Glucose, Bld: 76 mg/dL (ref 65–99)
Potassium: 4.6 mmol/L (ref 3.5–5.3)
Sodium: 140 mmol/L (ref 135–146)
Total Bilirubin: 0.5 mg/dL (ref 0.2–1.2)
Total Protein: 7.4 g/dL (ref 6.1–8.1)

## 2022-04-27 LAB — T-HELPER CELLS (CD4) COUNT (NOT AT ARMC)
Absolute CD4: 806 cells/uL (ref 490–1740)
CD4 T Helper %: 32 % (ref 30–61)
Total lymphocyte count: 2487 cells/uL (ref 850–3900)

## 2022-05-11 ENCOUNTER — Encounter (HOSPITAL_COMMUNITY): Payer: Self-pay | Admitting: Physician Assistant

## 2022-05-11 ENCOUNTER — Ambulatory Visit (INDEPENDENT_AMBULATORY_CARE_PROVIDER_SITE_OTHER): Payer: Medicare Other | Admitting: Physician Assistant

## 2022-05-11 VITALS — BP 135/68 | HR 99 | Ht 76.0 in | Wt 212.0 lb

## 2022-05-11 DIAGNOSIS — F99 Mental disorder, not otherwise specified: Secondary | ICD-10-CM | POA: Diagnosis not present

## 2022-05-11 DIAGNOSIS — F331 Major depressive disorder, recurrent, moderate: Secondary | ICD-10-CM | POA: Diagnosis not present

## 2022-05-11 DIAGNOSIS — F410 Panic disorder [episodic paroxysmal anxiety] without agoraphobia: Secondary | ICD-10-CM

## 2022-05-11 DIAGNOSIS — F5105 Insomnia due to other mental disorder: Secondary | ICD-10-CM

## 2022-05-11 DIAGNOSIS — F411 Generalized anxiety disorder: Secondary | ICD-10-CM

## 2022-05-11 MED ORDER — BUSPIRONE HCL 5 MG PO TABS: ORAL_TABLET | ORAL | 3 refills | Status: DC

## 2022-05-11 MED ORDER — GABAPENTIN 100 MG PO CAPS: ORAL_CAPSULE | ORAL | 3 refills | Status: DC

## 2022-05-11 MED ORDER — ESCITALOPRAM OXALATE 20 MG PO TABS: 20.0000 mg | ORAL_TABLET | Freq: Every day | ORAL | 3 refills | Status: DC

## 2022-05-11 NOTE — Progress Notes (Unsigned)
Therapist met with client as scheduled and discussed their progress. Therapist used active listening skills to provide client with opportunity to disclose previous challenges and successes since last session. Specific problem-solving skills were processed with client, including breaking down problems, brainstorming, evaluating, and choosing options. At times implementing a plan and evaluating and reevaluating results. Therapist assessed for SI/HI during session and will follow-up with client during the next session.   Client attended session with therapist as scheduled and presented alert; orient X4. On start of session, client affect appeared euthymic; affect was congruent with client report. Appearance was relaxed/casual, and attitude was appropriate. Client was receptive to rapport building by sharing information about him/herself, such as: expressing likes, dislikes, and strengths. Client talked about the good things going on in his life and his plans for the future. 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 Client progress toward therapeutic goal(s) are minimal at this time. Clinician referred client to a med management provider. Client denied SI/HI.

## 2022-05-12 ENCOUNTER — Encounter (HOSPITAL_COMMUNITY): Payer: Self-pay | Admitting: Physician Assistant

## 2022-05-12 NOTE — Progress Notes (Signed)
BH MD/PA/NP OP Progress Note  05/12/2022 4:59 AM Tyler Horn  MRN:  [id number]  Chief Complaint:  Chief Complaint  Patient presents with   Medication Management    HPI:   Tyler Horn is a 61 year old male with a past psychiatric history significant for insomnia, major depressive disorder with psychotic features, and generalized anxiety disorder with panic attacks who presents to [facility] for follow-up and medication management.  Language interpretive services were utilized during the encounter due to patient's use of sign language to communicate.  Patient is currently being managed on the following medications:  Buspirone 5 mg 3 times daily Gabapentin 200 mg 3 times daily Lexapro 20 mg daily Seroquel 300 mg at bedtime Seroquel XR 50 mg TB24 24-hour tablet daily Propranolol 5 mg daily  Patient reports that the use of his medications have been going well.  Patient states that he has been taking his medications as scheduled without issue.  He does note that occasionally his prescriptions are not available the date that they are supposed to be filled.  Patient denies experiencing depressive symptoms or anxiety.  Patient denies any new stressors at this time but does note that he has been looking for part-time work but not getting any results.  A GAD-7 screen was performed with the patient scoring a 2.  Patient is alert and oriented x4, pleasant,, cooperative, and fully engaged in the patient during the encounter.  Patient endorses stable mood but does note that he feels impatient over wanting her job.  Patient denies suicidal or homicidal ideations.  He further denies auditory or visual hallucinations and does not appear to be responding to internal/external stimuli.  Patient notes that he occasionally experiences spasms in his head that occur off and on but denies this symptom being heavily impactful.  Patient endorses good sleep and receives on  average 8 to 10 hours of sleep each night.  Patient endorses fair appetite.  He does endorse having some bubbly gassy sensation at times patient denies alcohol consumption, tobacco use, and illicit drug use.  Visit Diagnosis:    ICD-10-CM   1. Generalized anxiety disorder with panic attacks  F41.1 escitalopram (LEXAPRO) 20 MG tablet   F41.0 gabapentin (NEURONTIN) 100 MG capsule    busPIRone (BUSPAR) 5 MG tablet      Past Psychiatric History:  Generalized anxiety disorder Major depressive disorder Panic disorder Visual hallucinations  Past Medical History:  Past Medical History:  Diagnosis Date   Anxiety    Chronic hepatitis C (HCC) 04/27/2020   Depression    HIV (human immunodeficiency virus infection) (HCC)     Past Surgical History:  Procedure Laterality Date   CATARACT EXTRACTION Left     Family Psychiatric History:  Family history of psychiatric illness unknown  Family History:  Family History  Problem Relation Age of Onset   Mental illness Mother    Bipolar disorder Brother     Social History:  Social History   Socioeconomic History   Marital status: Single    Spouse name: Not on file   Number of children: Not on file   Years of education: Not on file   Highest education level: Not on file  Occupational History   Not on file  Tobacco Use   Smoking status: Never   Smokeless tobacco: Never  Vaping Use   Vaping Use: Never used  Substance and Sexual Activity   Alcohol use: Yes    Comment: occassionally   Drug use:  No   Sexual activity: Yes    Comment: declined condoms  Other Topics Concern   Not on file  Social History Narrative   Not on file   Social Determinants of Health   Financial Resource Strain: Not on file  Food Insecurity: Not on file  Transportation Needs: Not on file  Physical Activity: Not on file  Stress: Not on file  Social Connections: Not on file    Allergies: No Known Allergies  Metabolic Disorder Labs: Lab Results   Component Value Date   HGBA1C 5.2 07/07/2021   MPG 102.54 07/07/2021   MPG 96.8 05/06/2021   No results found for: "PROLACTIN" Lab Results  Component Value Date   CHOL 174 07/07/2021   TRIG 95 07/07/2021   HDL 32 (L) 07/07/2021   CHOLHDL 5.4 07/07/2021   VLDL 19 07/07/2021   LDLCALC 123 (H) 07/07/2021   LDLCALC 90 05/06/2021   Lab Results  Component Value Date   TSH 1.015 07/07/2021   TSH 1.199 05/06/2021    Therapeutic Level Labs: No results found for: "LITHIUM" No results found for: "VALPROATE" No results found for: "CBMZ"  Current Medications: Current Outpatient Medications  Medication Sig Dispense Refill   antiseptic oral rinse (BIOTENE) LIQD 15 mLs by Mouth Rinse route as needed for dry mouth.  0   bictegravir-emtricitabine-tenofovir AF (BIKTARVY) 50-200-25 MG TABS tablet Take 1 tablet by mouth daily. 30 tablet 6   busPIRone (BUSPAR) 5 MG tablet TAKE 1 TABLET(5 MG) BY MOUTH THREE TIMES DAILY 90 tablet 3   escitalopram (LEXAPRO) 20 MG tablet Take 1 tablet (20 mg total) by mouth daily. 30 tablet 3   gabapentin (NEURONTIN) 100 MG capsule TAKE 2 CAPSULES(200 MG) BY MOUTH THREE TIMES DAILY 180 capsule 3   propranolol (INDERAL) 10 MG tablet TAKE 1/2 TABLET(5 MG) BY MOUTH DAILY 45 tablet 1   QUEtiapine (SEROQUEL XR) 50 MG TB24 24 hr tablet Take 1 tablet (50 mg total) by mouth daily. 30 tablet 3   QUEtiapine (SEROQUEL) 300 MG tablet Take 1 tablet (300 mg total) by mouth at bedtime. 30 tablet 3   tamsulosin (FLOMAX) 0.4 MG CAPS capsule Take 1 capsule (0.4 mg total) by mouth daily. (Patient not taking: Reported on 12/15/2021) 30 capsule 0   No current facility-administered medications for this visit.     Musculoskeletal: Strength & Muscle Tone: within normal limits Gait & Station: normal Patient leans: N/A  Psychiatric Specialty Exam: Review of Systems  Psychiatric/Behavioral:  Negative for decreased concentration, dysphoric mood, hallucinations, self-injury, sleep  disturbance and suicidal ideas. The patient is not nervous/anxious and is not hyperactive.     Blood pressure 135/68, pulse 99, height 6\' 4"  (1.93 m), weight 212 lb (96.2 kg), SpO2 99 %.Body mass index is 25.81 kg/m.  General Appearance: Well Groomed  Eye Contact:  Good  Speech:  Clear and Coherent and Normal Rate  Volume:  Normal  Mood:  Euthymic  Affect:  Appropriate  Thought Process:  Coherent and Descriptions of Associations: Intact  Orientation:  Full (Time, Place, and Person)  Thought Content: WDL   Suicidal Thoughts:  No  Homicidal Thoughts:  No  Memory:  Immediate;   Good Recent;   Fair Remote;   Fair  Judgement:  Good  Insight:  Fair  Psychomotor Activity:  Normal  Concentration:  Concentration: Good and Attention Span: Good  Recall:  Good  Fund of Knowledge: Fair  Language: Unable to assess due to patient utilizing sign language. Patient has no issues  being able to convey his chief complaints and thoughts through sign language.  Akathisia:  No  Handed:  Right  AIMS (if indicated): not done  Assets:  Communication Skills Desire for Improvement Social Support Vocational/Educational  ADL's:  Intact  Cognition: WNL  Sleep:  Good   Screenings: AIMS    Flowsheet Row [facility] ([facility] Ad[facility] ([facility]) [facility] Ad[facility] ([facility]) [facility]  AIMS Total Score 13 3 0 0      AUDIT    Flowsheet Row [facility]) [facility] Ad[facility] (Discharged) [facility] Ad[facility] (Discharged) [facility] Ad[facility] (Discharged) from [facility]28/2019 in [facility]  INPATIENT BEHAVIORAL MEDICINE Ad[facility] ([facility]) [facility]  Alcohol Use Disorder Identification Test Final Score (AUDIT) 0 0 0 0 0      GAD-7    Flowsheet Row Office Visit from 05/11/2022 in [facility] Office Visit from 02/09/2022 in [facility] Office Visit from 11/10/2021 in [facility] Video Visit from 09/11/2021 in [facility] Office Visit from 08/04/2021 in [facility]  Total GAD-7 Score 2 3 3 4 8       PHQ2-9    Flowsheet Row Office Visit from 05/11/2022 in [facility] Office Visit from 04/23/2022 in [facility] Office Visit from 02/09/2022 in [facility] Office Visit from 12/15/2021 in [facility] Office Visit from 11/10/2021 in [facility]  PHQ-2 Total Score 0 0 0 0 2  PHQ-9 Total Score -- -- -- -- 3      Flowsheet Row Office Visit from 05/11/2022 in [facility] Office Visit from 02/09/2022 in [facility] Office Visit from 11/10/2021 in [facility]  C-SSRS RISK CATEGORY Low Risk No Risk No Risk        Assessment and Plan:   Tyler Horn is a 61 year old male with a past psychiatric history significant for insomnia, major depressive disorder with psychotic features, and generalized anxiety disorder with panic attacks who presents to [facility] for follow-up and medication management.  Patient reports no issues or concerns regarding his current medication regimen.  Patient denies depressive symptoms or anxiety and appears to be stable on his current medication regimen.  Patient is requesting refills on some  of his medications following the conclusion of the encounter.  Patient's medications to be e-prescribed to pharmacy of choice.  Collaboration of Care: Collaboration of Care: Medication Management AEB provider managing patient's psychiatric medications, Psychiatrist AEB patient being followed by a mental health provider, and Other provider involved in patient's care AEB patient being followed by infectious diseases  Patient/Guardian was advised Release of Information must be obtained prior to any record release in order to collaborate their care with an outside provider. Patient/Guardian was advised if they have not already done so to contact the registration department to sign all necessary forms in order for [facility] to release information regarding their care.   Consent: Patient/Guardian gives verbal consent for treatment  and assignment of benefits for services provided during this visit. Patient/Guardian expressed understanding and agreed to proceed.   1. Generalized anxiety disorder with panic attacks  - escitalopram (LEXAPRO) 20 MG tablet; Take 1 tablet (20 mg total) by mouth daily.  Dispense: 30 tablet; Refill: 3 - gabapentin (NEURONTIN) 100 MG capsule; TAKE 2 CAPSULES(200 MG) BY MOUTH THREE TIMES DAILY  Dispense: 180 capsule; Refill: 3 - busPIRone (BUSPAR) 5 MG tablet; TAKE 1 TABLET(5 MG) BY MOUTH THREE TIMES DAILY  Dispense: 90 tablet; Refill: 3  Patient to continue taking Seroquel XR 50 mg 24-hour tablet by mouth daily for the management of his generalized anxiety disorder with panic attacks Patient to continue taking Seroquel 300 mg at bedtime for the management of his generalized anxiety disorder with panic attacks  2. Insomnia due to other mental disorder   3. Moderate episode of recurrent major depressive disorder (HCC) Patient to continue taking Seroquel 300 mg at bedtime for the management of his major depressive disorder Patient to continue taking Seroquel XR 50 mg 24-hour tablet for  the management of his major depressive disorder Patient to continue taking escitalopram 20 mg daily for the management of his major depressive disorder  Patient to follow up in 3 months Provider spent a total of 11 minutes with the patient/reviewing patient's chart  Meta Hatchet, PA 05/12/2022, 4:59 AM

## 2022-05-27 ENCOUNTER — Other Ambulatory Visit (HOSPITAL_COMMUNITY): Payer: Self-pay | Admitting: Physician Assistant

## 2022-05-27 DIAGNOSIS — F5105 Insomnia due to other mental disorder: Secondary | ICD-10-CM

## 2022-05-27 DIAGNOSIS — F410 Panic disorder [episodic paroxysmal anxiety] without agoraphobia: Secondary | ICD-10-CM

## 2022-05-27 DIAGNOSIS — F331 Major depressive disorder, recurrent, moderate: Secondary | ICD-10-CM

## 2022-08-10 ENCOUNTER — Encounter (HOSPITAL_COMMUNITY): Payer: No Payment, Other | Admitting: Physician Assistant

## 2022-08-11 ENCOUNTER — Other Ambulatory Visit: Payer: Self-pay

## 2022-08-11 DIAGNOSIS — Z111 Encounter for screening for respiratory tuberculosis: Secondary | ICD-10-CM

## 2022-08-11 DIAGNOSIS — Z21 Asymptomatic human immunodeficiency virus [HIV] infection status: Secondary | ICD-10-CM

## 2022-08-11 DIAGNOSIS — Z0000 Encounter for general adult medical examination without abnormal findings: Secondary | ICD-10-CM

## 2022-08-11 NOTE — Telephone Encounter (Signed)
Please leave copy of vaccines for him to pick up and okay to enter Quantiferon testing if needed.

## 2022-08-12 ENCOUNTER — Other Ambulatory Visit: Payer: Self-pay

## 2022-08-12 ENCOUNTER — Other Ambulatory Visit: Payer: Medicare Other

## 2022-08-12 DIAGNOSIS — Z21 Asymptomatic human immunodeficiency virus [HIV] infection status: Secondary | ICD-10-CM

## 2022-08-12 DIAGNOSIS — Z0000 Encounter for general adult medical examination without abnormal findings: Secondary | ICD-10-CM

## 2022-08-12 DIAGNOSIS — Z111 Encounter for screening for respiratory tuberculosis: Secondary | ICD-10-CM

## 2022-08-12 NOTE — Telephone Encounter (Signed)
Spoke with patient via interpreter, gave him copy of vaccine we have in chart.

## 2022-08-12 NOTE — Addendum Note (Signed)
Addended by: Marcell Anger on: 08/12/2022 08:41 AM   Modules accepted: Orders

## 2022-08-14 LAB — QUANTIFERON-TB GOLD PLUS
Mitogen-NIL: >10 [IU]/mL
NIL: 0.06 [IU]/mL
QuantiFERON-TB Gold Plus: NEGATIVE
TB1-NIL: <0 [IU]/mL
TB2-NIL: <0 [IU]/mL

## 2022-09-09 ENCOUNTER — Encounter (HOSPITAL_COMMUNITY): Payer: Self-pay | Admitting: Student in an Organized Health Care Education/Training Program

## 2022-09-09 ENCOUNTER — Ambulatory Visit (INDEPENDENT_AMBULATORY_CARE_PROVIDER_SITE_OTHER): Payer: No Payment, Other | Admitting: Student in an Organized Health Care Education/Training Program

## 2022-09-09 VITALS — BP 111/73 | HR 80 | Resp 12 | Wt 217.0 lb

## 2022-09-09 DIAGNOSIS — F331 Major depressive disorder, recurrent, moderate: Secondary | ICD-10-CM

## 2022-09-09 DIAGNOSIS — F411 Generalized anxiety disorder: Secondary | ICD-10-CM

## 2022-09-09 DIAGNOSIS — F3340 Major depressive disorder, recurrent, in remission, unspecified: Secondary | ICD-10-CM

## 2022-09-09 DIAGNOSIS — F5105 Insomnia due to other mental disorder: Secondary | ICD-10-CM | POA: Diagnosis not present

## 2022-09-09 DIAGNOSIS — F410 Panic disorder [episodic paroxysmal anxiety] without agoraphobia: Secondary | ICD-10-CM

## 2022-09-09 DIAGNOSIS — F99 Mental disorder, not otherwise specified: Secondary | ICD-10-CM

## 2022-09-09 MED ORDER — QUETIAPINE FUMARATE 200 MG PO TABS: ORAL_TABLET | ORAL | 3 refills | Status: DC

## 2022-09-09 MED ORDER — BUSPIRONE HCL 5 MG PO TABS: ORAL_TABLET | ORAL | 3 refills | Status: DC

## 2022-09-09 MED ORDER — GABAPENTIN 100 MG PO CAPS: ORAL_CAPSULE | ORAL | 3 refills | Status: DC

## 2022-09-09 MED ORDER — ESCITALOPRAM OXALATE 20 MG PO TABS: 20.0000 mg | ORAL_TABLET | Freq: Every day | ORAL | 3 refills | Status: DC

## 2022-09-09 MED ORDER — QUETIAPINE FUMARATE ER 50 MG PO TB24: ORAL_TABLET | ORAL | 3 refills | Status: DC

## 2022-09-09 NOTE — Patient Instructions (Signed)
Medication Changes  STOP Seroquel XR 50mg  today, 09/09/2021. In 2 weeks (when you finish your last dose) you can decrease the 300mg  night time dose to 200mg  nightly. Continue all other medications as previously prescribed

## 2022-09-09 NOTE — Progress Notes (Signed)
BH MD/PA/NP OP Progress Note  09/09/2022 3:10 PM Tyler Horn  MRN:  [id number]  Chief Complaint:  Chief Complaint  Patient presents with   Follow-up   HPI: Tyler Horn is a 62 year old male with a past psychiatric history significant for insomnia, major depressive disorder with psychotic features, and generalized anxiety disorder with panic attacks who presents to [facility] for follow-up and medication management. Language interpretive services were utilized during the encounter due to patient's use of sign language to communicate. Patient is currently being managed on the following medications:  Buspirone 5 mg 3 times daily Gabapentin 200 mg 3 times daily Lexapro 20 mg daily Seroquel 300 mg at bedtime Seroquel XR 50 mg TB24 24-hour tablet daily Propranolol 5 mg daily   Patient reports that he doing well today. Patient reports that he will be seeing a PCP in 10/2021 and will be going to get labs updated. Patient reports that he has not had any panic attacks in a long time. Patient reports that his mood is "stable" and his appeitite is "perfect." Patient denies anhedonia and hopelessness and guilt. Patient denies SI, HI, and AVH. Patient andl denies feeling over irritable.  Patient reports that he is interested in decreasing some of his medication due to his stability. Provider and patient decide to decrease patient's Seroquel. Patient reports that he has 2 part time jobs that he currently is enjoying, and is still not having any issues with anxiety, so he is ok with discontinuing his day time dose. Patient also endorses that he is sleeping well and believes that being more active during the day will continue to help with this.  Visit Diagnosis:    ICD-10-CM   1. MDD (recurrent major depressive disorder) in remission ([place])  F33.40     2. Generalized anxiety disorder with panic attacks  F41.1 busPIRone (BUSPAR) 5 MG tablet   F41.0 escitalopram  (LEXAPRO) 20 MG tablet    gabapentin (NEURONTIN) 100 MG capsule    QUEtiapine (SEROQUEL) 200 MG tablet    DISCONTINUED: QUEtiapine (SEROQUEL XR) 50 MG TB24 24 hr tablet    3. Moderate episode of recurrent major depressive disorder (HCC)  F33.1 QUEtiapine (SEROQUEL) 200 MG tablet    4. Insomnia due to other mental disorder  F51.05 QUEtiapine (SEROQUEL) 200 MG tablet   F99       Past Psychiatric History: Generalized anxiety disorder Major depressive disorder Panic disorder Visual hallucinations    Past Medical History:  Past Medical History:  Diagnosis Date   Anxiety    Chronic hepatitis C ([place]) 04/27/2020   Depression    HIV (human immunodeficiency virus infection) ([place])     Past Surgical History:  Procedure Laterality Date   CATARACT EXTRACTION Left     Family Psychiatric History:  Mother and brother have Bipolar disorder  Family History:  Family History  Problem Relation Age of Onset   Mental illness Mother    Bipolar disorder Brother     Social History:  Social History   Socioeconomic History   Marital status: Single    Spouse name: Not on file   Number of children: Not on file   Years of education: Not on file   Highest education level: Not on file  Occupational History   Not on file  Tobacco Use   Smoking status: Never   Smokeless tobacco: Never  Vaping Use   Vaping Use: Never used  Substance and Sexual Activity   Alcohol use: Yes  Comment: occassionally   Drug use: No   Sexual activity: Yes    Comment: declined condoms  Other Topics Concern   Not on file  Social History Narrative   Not on file   Social Determinants of Health   Financial Resource Strain: Not on file  Food Insecurity: Not on file  Transportation Needs: Not on file  Physical Activity: Not on file  Stress: Not on file  Social Connections: Not on file    Allergies: No Known Allergies  Metabolic Disorder Labs: Lab Results  Component Value Date   HGBA1C 5.2 07/07/2021    MPG 102.54 07/07/2021   MPG 96.8 05/06/2021   No results found for: "PROLACTIN" Lab Results  Component Value Date   CHOL 174 07/07/2021   TRIG 95 07/07/2021   HDL 32 (L) 07/07/2021   CHOLHDL 5.4 07/07/2021   VLDL 19 07/07/2021   LDLCALC 123 (H) 07/07/2021   [place] 90 05/06/2021   Lab Results  Component Value Date   TSH 1.015 07/07/2021   TSH 1.199 05/06/2021    Therapeutic Level Labs: No results found for: "LITHIUM" No results found for: "VALPROATE" No results found for: "CBMZ"  Current Medications: Current Outpatient Medications  Medication Sig Dispense Refill   antiseptic oral rinse (BIOTENE) LIQD 15 mLs by Mouth Rinse route as needed for dry mouth.  0   bictegravir-emtricitabine-tenofovir AF (BIKTARVY) 50-200-25 MG TABS tablet Take 1 tablet by mouth daily. 30 tablet 6   busPIRone (BUSPAR) 5 MG tablet TAKE 1 TABLET(5 MG) BY MOUTH THREE TIMES DAILY 90 tablet 3   escitalopram (LEXAPRO) 20 MG tablet Take 1 tablet (20 mg total) by mouth daily. 30 tablet 3   gabapentin (NEURONTIN) 100 MG capsule TAKE 2 CAPSULES(200 MG) BY MOUTH THREE TIMES DAILY 180 capsule 3   propranolol (INDERAL) 10 MG tablet TAKE 1/2 TABLET(5 MG) BY MOUTH DAILY 45 tablet 1   QUEtiapine (SEROQUEL) 200 MG tablet TAKE 1 TABLET(300 MG) BY MOUTH AT BEDTIME 30 tablet 3   tamsulosin (FLOMAX) 0.4 MG CAPS capsule Take 1 capsule (0.4 mg total) by mouth daily. (Patient not taking: Reported on 12/15/2021) 30 capsule 0   No current facility-administered medications for this visit.     Musculoskeletal: Strength & Muscle Tone: within normal limits Gait & Station: normal Patient leans: N/A  Psychiatric Specialty Exam: Review of Systems  Psychiatric/Behavioral:  Negative for decreased concentration, dysphoric mood, hallucinations and suicidal ideas.     Blood pressure 111/73, pulse 80, resp. rate 12, weight 217 lb (98.4 kg).Body mass index is 26.41 kg/m.  General Appearance: Casual  Eye Contact:  Good   Speech:  Clear and Coherent  Volume:  Normal  Mood:  Euthymic  Affect:  Appropriate  Thought Process:  Coherent  Orientation:  Full (Time, Place, and Person)  Thought Content: Logical   Suicidal Thoughts:  No  Homicidal Thoughts:  No  Memory:  Immediate;   Good Recent;   Good  Judgement:  Good  Insight:  Good  Psychomotor Activity:  Normal  Concentration:  Concentration: Good  Recall:  NA  Fund of Knowledge: Good  Language: Good  Akathisia:  No  Handed:    AIMS (if indicated): not done  Assets:  Communication Skills Desire for Improvement Housing Leisure Time Resilience Social Support Talents/Skills Vocational/Educational  ADL's:  Intact  Cognition: WNL  Sleep:  Good   Screenings: AIMS    Flowsheet Row Admission (Discharged) from 07/07/2021 in [facility] Ad[facility] ([facility]) [facility] ([facility]) [facility]  AIMS Total Score 13 3 0 0      AUDIT    Flowsheet Row Ad[facility]) [facility] Admission (Discharged) [facility] Ad[facility] (Discharged) [facility] (Discharged) [facility]28/2019 in [facility] Admission (Discharged) [facility]  Alcohol Use Disorder Identification Test Final Score (AUDIT) 0 0 0 0 0      GAD-7    Flowsheet Row Office Visit from 05/11/2022 in [facility] Office Visit from 02/09/2022 in [facility] Office Visit from 11/10/2021 in [facility] Video Visit from 09/11/2021 in [facility] Office Visit from 08/04/2021 in [facility]  Total GAD-7 Score 2 3 3 4 8       PHQ2-9    Flowsheet Row Office Visit from 05/11/2022 in [facility] Office Visit from 04/23/2022 in [facility] for [facility]ious Disease Office Visit from 02/09/2022 in [facility] Office Visit from 12/15/2021 in [facility] for Infectious Disease Office Visit from 11/10/2021 in [facility]  PHQ-2 Total Score 0 0 0 0 2  PHQ-9 Total Score -- -- -- -- 3      Flowsheet Row Office Visit from 05/11/2022 in [facility] Office Visit from 02/09/2022 in [facility] Office Visit from 11/10/2021 in [facility]  C-SSRS RISK CATEGORY Low Risk No Risk No Risk        Assessment and Plan:  Tyler Horn is a 61 year old male with a past psychiatric history significant for insomnia, major depressive disorder with psychotic features, and generalized anxiety disorder with panic attacks who presents to [facility] for follow-up and medication management.  Based on assessment patient appears to be doing very well. Would like to trial patient at lower dose of Seroquel, patient does have a hx of MDD w/ psychosis. Will attempt to decrease dose to decrease risk for adverse side effects such as metabolic syndrome. Patient will be getting updated labs at his f/u appts with [facility] and his new appt with his PCP. Patient is no longer endorsing significant anxiety and appears to have good work- life balance.  MDD, in remission Hx of MDD w/ psychosis Hx of GAD w/ panic attacks - Continue buspar 5mg  TID - Continue Lexparo 20mg  daily - Continue Gabapentin 200mg  TID - Discontinue Seroquel XR 50mg  - Decrease Seroquel to 200mg  QHS in 2 weeks  F/u in 3  mon  Collaboration of Care: Collaboration of Care:   Patient/Guardian was advised Release of Information must be obtained prior to any record release in order to collaborate their care with an outside provider. Patient/Guardian was advised if they have not already done so to contact the registration department to sign all necessary forms in order for [facility] to release information regarding their care.   Consent: Patient/Guardian gives verbal consent for treatment and assignment of benefits for services provided during this visit. Patient/Guardian expressed understanding and agreed to proceed.   PGY-3 , MD 09/09/2022, 3:10 PM

## 2022-09-19 DIAGNOSIS — Z21 Asymptomatic human immunodeficiency virus [HIV] infection status: Secondary | ICD-10-CM

## 2022-09-19 DIAGNOSIS — Z0000 Encounter for general adult medical examination without abnormal findings: Secondary | ICD-10-CM

## 2022-09-23 ENCOUNTER — Other Ambulatory Visit: Payer: Self-pay

## 2022-09-23 ENCOUNTER — Other Ambulatory Visit: Payer: Medicare Other

## 2022-09-23 DIAGNOSIS — Z21 Asymptomatic human immunodeficiency virus [HIV] infection status: Secondary | ICD-10-CM

## 2022-09-23 DIAGNOSIS — Z0000 Encounter for general adult medical examination without abnormal findings: Secondary | ICD-10-CM | POA: Diagnosis not present

## 2022-09-24 LAB — VARICELLA ZOSTER ANTIBODY, IGG: Varicella IgG: >4000 index

## 2022-09-24 LAB — MEASLES/MUMPS/RUBELLA IMMUNITY
Mumps IgG: >300 AU/mL
Rubella: 3.09 Index
Rubeola IgG: >300 AU/mL

## 2022-10-03 ENCOUNTER — Other Ambulatory Visit (HOSPITAL_COMMUNITY): Payer: Self-pay | Admitting: Physician Assistant

## 2022-10-03 DIAGNOSIS — F410 Panic disorder [episodic paroxysmal anxiety] without agoraphobia: Secondary | ICD-10-CM

## 2022-10-03 DIAGNOSIS — F99 Mental disorder, not otherwise specified: Secondary | ICD-10-CM

## 2022-10-03 DIAGNOSIS — F331 Major depressive disorder, recurrent, moderate: Secondary | ICD-10-CM

## 2022-10-25 ENCOUNTER — Other Ambulatory Visit: Payer: Self-pay

## 2022-10-25 ENCOUNTER — Encounter: Payer: Self-pay | Admitting: Family

## 2022-10-25 ENCOUNTER — Ambulatory Visit (INDEPENDENT_AMBULATORY_CARE_PROVIDER_SITE_OTHER): Payer: Medicare Other | Admitting: Family

## 2022-10-25 VITALS — BP 113/75 | HR 111 | Temp 96.3°F | Wt 216.0 lb

## 2022-10-25 DIAGNOSIS — Z21 Asymptomatic human immunodeficiency virus [HIV] infection status: Secondary | ICD-10-CM

## 2022-10-25 DIAGNOSIS — Z0000 Encounter for general adult medical examination without abnormal findings: Secondary | ICD-10-CM | POA: Diagnosis not present

## 2022-10-25 MED ORDER — BIKTARVY 50-200-25 MG PO TABS: 1.0000 | ORAL_TABLET | Freq: Every day | ORAL | 6 refills | Status: DC

## 2022-10-25 MED ORDER — ZOSTER VAC RECOMB ADJUVANTED 50 MCG/0.5ML IM SUSR: 0.5000 mL | Freq: Once | INTRAMUSCULAR | 1 refills | Status: AC

## 2022-10-25 NOTE — Assessment & Plan Note (Signed)
Tyler Horn has well controlled virus with good adherence and tolerance to [employer]. Reviewed previous lab work and discussed plan of care. Check lab work. Continue current dose of Biktarvy. Plan for follow up in 6 months or sooner if needed with lab work on the same day.

## 2022-10-25 NOTE — Progress Notes (Signed)
Brief Narrative   Patient ID: Tyler Horn, male    DOB: 08-Dec-1960, 62 y.o.   MRN: [id number]  Tyler Horn is a 62 year old Caucasian male diagnosed with HIV-1 in 2005 with risk factor of MSM.  CD4 nadir and initial viral load are unknown.  [id number] negative. Came to clinic on nevirapine and Truvada and switched to [place].  No history of opportunistic infection.   Subjective:    Chief Complaint  Patient presents with   Follow-up    B20    HPI:  Tyler Horn is a 62 y.o. male with HIV disease last seen on 04/23/22 with well controlled virus and good adherence nd tolerance to Biktarvy. Viral load was undetectable and CD4 count was 806. Renal function, electrolytes and hepatic function within normal ranges. Tyler Horn is hearing impaired and a sign language interpreter is present to aid in communication.  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 Has been taking his Biktarvy with no adverse side effects or problems obtaining from the pharmacy. No new concerns/complaints. Going to establish with Internal Medicine next week. Condoms and STD testing offered. Due for Shingrix vaccination.  Denies fevers, chills, night sweats, headaches, changes in vision, neck pain/stiffness, nausea, diarrhea, vomiting, lesions or rashes.   No Known Allergies    Outpatient Medications Prior to Visit  Medication Sig Dispense Refill   antiseptic oral rinse (BIOTENE) LIQD 15 mLs by Mouth Rinse route as needed for dry mouth.  0   busPIRone (BUSPAR) 5 MG tablet TAKE 1 TABLET(5 MG) BY MOUTH THREE TIMES DAILY 90 tablet 3   escitalopram (LEXAPRO) 20 MG tablet Take 1 tablet (20 mg total) by mouth daily. 30 tablet 3   gabapentin (NEURONTIN) 100 MG capsule TAKE 2 CAPSULES(200 MG) BY MOUTH THREE TIMES DAILY 180 capsule 3   QUEtiapine (SEROQUEL) 200 MG tablet TAKE 1 TABLET(300 MG) BY MOUTH AT BEDTIME 30 tablet 3    tamsulosin (FLOMAX) 0.4 MG CAPS capsule Take 1 capsule (0.4 mg total) by mouth daily. 30 capsule 0   bictegravir-emtricitabine-tenofovir AF (BIKTARVY) 50-200-25 MG TABS tablet Take 1 tablet by mouth daily. 30 tablet 6   propranolol (INDERAL) 10 MG tablet TAKE 1/2 TABLET(5 MG) BY MOUTH DAILY (Patient not taking: Reported on 10/25/2022) 45 tablet 1   No facility-administered medications prior to visit.     Past Medical History:  Diagnosis Date   Anxiety    Chronic hepatitis C (HCC) 04/27/2020   Depression    HIV (human immunodeficiency virus infection) (HCC)      Past Surgical History:  Procedure Laterality Date   CATARACT EXTRACTION Left       Review of Systems  Constitutional:  Negative for appetite change, chills, fatigue, fever and unexpected weight change.  Eyes:  Negative for visual disturbance.  Respiratory:  Negative for cough, chest tightness, shortness of breath and wheezing.   Cardiovascular:  Negative for chest pain and leg swelling.  Gastrointestinal:  Negative for abdominal pain, constipation, diarrhea, nausea and vomiting.  Genitourinary:  Negative for dysuria, flank pain, frequency, genital sores, hematuria and urgency.  Skin:  Negative for rash.  Allergic/Immunologic: Negative for immunocompromised state.  Neurological:  Negative for dizziness and headaches.      Objective:    BP 113/75   Pulse (!) 111   Temp (!) 96.3 F (35.7 C) (Temporal)   Wt 216 lb (98 kg)  BMI 26.29 kg/m  Nursing note and vital signs reviewed.  Physical Exam Constitutional:      General: Tyler Horn is not in acute distress.    Appearance: Tyler Horn is well-developed.  Eyes:     Conjunctiva/sclera: Conjunctivae normal.  Cardiovascular:     Rate and Rhythm: Normal rate and regular rhythm.     Heart sounds: Normal heart sounds. No murmur heard.    No friction rub. No gallop.  Pulmonary:     Effort: Pulmonary effort is normal. No respiratory distress.     Breath sounds: Normal breath  sounds. No wheezing or rales.  Chest:     Chest wall: No tenderness.  Abdominal:     General: Bowel sounds are normal.     Palpations: Abdomen is soft.     Tenderness: There is no abdominal tenderness.  Musculoskeletal:     Cervical back: Neck supple.  Lymphadenopathy:     Cervical: No cervical adenopathy.  Skin:    General: Skin is warm and dry.     Findings: No rash.  Neurological:     Mental Status: Tyler Horn is alert and oriented to person, place, and time.  Psychiatric:        Behavior: Behavior normal.        Thought Content: Thought content normal.        Judgment: Judgment normal.         10/25/2022   10:22 AM 05/11/2022    2:19 PM 04/23/2022   10:51 AM 02/09/2022    2:15 PM 12/15/2021    9:54 AM  Depression screen PHQ 2/9  Decreased Interest 0  0  0  Down, Depressed, Hopeless 0  0  0  PHQ - 2 Score 0  0  0     Information is confidential and restricted. Go to Review Flowsheets to unlock data.       Assessment & Plan:    Patient Active Problem List   Diagnosis Date Noted   MDD (recurrent major depressive disorder) in remission (HCC) 09/09/2022   GAD (generalized anxiety disorder) 07/14/2021   MDD (major depressive disorder) 05/05/2021   Insomnia due to other mental disorder 02/19/2021   Panic disorder 01/07/2021   Severe episode of recurrent major depressive disorder, with psychotic features (HCC) 10/16/2020   Vision changes 07/09/2020   Weight loss 07/09/2020   Idiopathic peripheral neuropathy 04/27/2020   Herpes simplex 04/27/2020   Herpes zoster 04/27/2020   Healthcare maintenance 12/19/2019   Early syphilis, 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   Post-traumatic stress disorder, unspecified 11/28/2019   Sedative, 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   Major depressive disorder, recurrent, severe with psychotic features (HCC) 07/13/2017   MDD (major depressive disorder), recurrent, severe, with psychosis (HCC) 07/31/2014    Benzodiazepine misuse 07/31/2014   Human immunodeficiency virus infection (HCC)    Delusional disorder (HCC) 07/30/2014   Suicidal thoughts 07/30/2014   Visual hallucinations    Moderate episode of recurrent major depressive disorder (HCC) 10/03/2013   Generalized anxiety disorder with panic attacks 03/26/2013     Problem List Items Addressed This Visit       Other   Human immunodeficiency virus infection (HCC)    Eita has well controlled virus with good adherence and tolerance to Biktarvy. Reviewed previous lab work and discussed plan of care. Check lab work. Continue current dose of Biktarvy. Plan for follow up in 6 months or sooner if needed with lab work on the same day.  Relevant Medications   bictegravir-emtricitabine-tenofovir AF (BIKTARVY) 50-200-25 MG TABS tablet   Zoster Vaccine Adjuvanted [facility]) injection   Other Relevant Orders   BASIC METABOLIC PANEL WITH GFR   HIV-1 RNA quant-no reflex-bld   T-helper cell (CD4)- ([facility] clinic only)   Healthcare maintenance - Primary    Discussed importance of safe sexual practice and condom use. Condoms and STD testing offered.  Shingrix prescription sent to pharmacy. Will discuss colonoscopy with Internal Medicine.         I have discontinued Seneca Kreiter's propranolol. I am also having him start on Zoster Vaccine Adjuvanted. Additionally, I am having him maintain his antiseptic oral rinse, tamsulosin, busPIRone, escitalopram, gabapentin, QUEtiapine, and Biktarvy.   Meds ordered this encounter  Medications   bictegravir-emtricitabine-tenofovir AF (BIKTARVY) 50-200-25 MG TABS tablet    Sig: Take 1 tablet by mouth daily.    Dispense:  30 tablet    Refill:  6    Order Specific Question:   Supervising Provider    Answer:   Drue Second, CYNTHIA [[id number]]   Zoster Vaccine Adjuvanted [facility]) injection    Sig: Inject 0.5 mLs into the muscle once for 1 dose. Please provide second dose 2-6 months later.    Dispense:  0.5 mL     Refill:  1    Order Specific Question:   Supervising Provider    Answer:   Judyann Munson [[id number]]     Follow-up: Return in about 6 months (around 04/25/2023), or if symptoms worsen or fail to improve.   Marcos Eke, MSN, FNP-C Nurse Practitioner [facility] Main number: [phone]

## 2022-10-25 NOTE — Patient Instructions (Addendum)
Nice to see you.  We will check your lab work today.  Continue to take your medication daily as prescribed.  Refills have been sent to the pharmacy.  Plan for follow up in 6 months or sooner if needed with lab work 1-2 weeks prior to appointment.   Have a great day and stay safe!

## 2022-10-25 NOTE — Assessment & Plan Note (Signed)
Discussed importance of safe sexual practice and condom use. Condoms and STD testing offered.  Shingrix prescription sent to pharmacy. Will discuss colonoscopy with Internal Medicine.

## 2022-10-26 LAB — T-HELPER CELL (CD4) - (RCID CLINIC ONLY)
CD4 % Helper T Cell: 34 % (ref 33–65)
CD4 T Cell Abs: 589 /uL (ref 400–1790)

## 2022-10-28 LAB — BASIC METABOLIC PANEL WITH GFR
BUN: 23 mg/dL (ref 7–25)
CO2: 29 mmol/L (ref 20–32)
Calcium: 9.7 mg/dL (ref 8.6–10.3)
Chloride: 104 mmol/L (ref 98–110)
Creat: 1.32 mg/dL (ref 0.70–1.35)
Glucose, Bld: 87 mg/dL (ref 65–99)
Potassium: 3.9 mmol/L (ref 3.5–5.3)
Sodium: 140 mmol/L (ref 135–146)
eGFR: 61 mL/min/{1.73_m2} (ref >=60)

## 2022-10-28 LAB — HIV-1 RNA QUANT-NO REFLEX-BLD
HIV 1 RNA Quant: NOT DETECTED Copies/mL
HIV-1 RNA Quant, Log: NOT DETECTED Log cps/mL

## 2022-11-02 ENCOUNTER — Ambulatory Visit: Payer: Self-pay | Admitting: Internal Medicine

## 2022-12-13 ENCOUNTER — Encounter (HOSPITAL_COMMUNITY): Payer: Self-pay | Admitting: Student in an Organized Health Care Education/Training Program

## 2022-12-13 ENCOUNTER — Ambulatory Visit (HOSPITAL_COMMUNITY): Payer: Medicare Other | Admitting: Student in an Organized Health Care Education/Training Program

## 2022-12-13 VITALS — BP 127/80 | HR 92 | Resp 12 | Wt 202.0 lb

## 2022-12-13 DIAGNOSIS — G2401 Drug induced subacute dyskinesia: Secondary | ICD-10-CM

## 2022-12-13 DIAGNOSIS — F410 Panic disorder [episodic paroxysmal anxiety] without agoraphobia: Secondary | ICD-10-CM

## 2022-12-13 DIAGNOSIS — F331 Major depressive disorder, recurrent, moderate: Secondary | ICD-10-CM

## 2022-12-13 DIAGNOSIS — F99 Mental disorder, not otherwise specified: Secondary | ICD-10-CM

## 2022-12-13 MED ORDER — ESCITALOPRAM OXALATE 20 MG PO TABS
20.0000 mg | ORAL_TABLET | Freq: Every day | ORAL | 3 refills | Status: DC
Start: 2022-12-13 — End: 2023-02-04

## 2022-12-13 MED ORDER — VALBENAZINE TOSYLATE 40 MG PO CAPS: 40.0000 mg | ORAL_CAPSULE | Freq: Every day | ORAL | 2 refills | Status: DC

## 2022-12-13 MED ORDER — QUETIAPINE FUMARATE 200 MG PO TABS: ORAL_TABLET | ORAL | 3 refills | Status: DC

## 2022-12-13 MED ORDER — GABAPENTIN 100 MG PO CAPS: ORAL_CAPSULE | ORAL | 3 refills | Status: DC

## 2022-12-13 MED ORDER — BUSPIRONE HCL 5 MG PO TABS: ORAL_TABLET | ORAL | 3 refills | Status: DC

## 2022-12-13 NOTE — Progress Notes (Signed)
BH MD/PA/NP OP Progress Note  12/13/2022 2:36 PM Daryle Amis  MRN:  [id number]  Chief Complaint:  Chief Complaint  Patient presents with   Follow-up   HPI: Alic Hilburn is a 62 year old male with a past psychiatric history significant for insomnia, major depressive disorder with psychotic features, and generalized anxiety disorder with panic attacks who presents to [facility] for follow-up and medication management. Language interpretive services were utilized during the encounter due to patient's use of sign language to communicate. Patient is currently being managed on the following medications:   Propanolol  daily dcd by Ifx Dx MD (09/2022) Buspar  TID Lexapro  daily Gabapentin  TID Seroquel  QHS   Patient reports that he is doing well. 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 He reports he is doing a lot in the community. HE reports that for the most past his sleep is very good. He reports that without his Seroquel (out of rx) he does not sleep well. He denies feeling significant anxiety or feeling on edge. He denies feeling hopeless, worthless. He denies feeling paranoid. He denies SI, HI, and VH.   Patient has involuntary movements of his mouth that are not rhythmic. He reports that this has been going on the last year. Reports that he will use hard candy to keep his mouth busy and the movements less noticeable.   ASL interpreter reports that the mouth movements was much worse when he was inpatient, but with medication it has improved. She agrees they are only a little over 60 year old.   Spoke with patient about decreasing Buspar to  BID.   Visit Diagnosis:    ICD-10-CM   1. Tardive dyskinesia  G24.01 valbenazine (INGREZZA) 40 MG capsule    2. Generalized anxiety disorder with panic attacks  F41.1 escitalopram (LEXAPRO) 20 MG tablet   F41.0 gabapentin (NEURONTIN) 100 MG  capsule    QUEtiapine (SEROQUEL) 200 MG tablet    busPIRone (BUSPAR) 5 MG tablet    3. Moderate episode of recurrent major depressive disorder  F33.1 QUEtiapine (SEROQUEL) 200 MG tablet    4. Insomnia due to other mental disorder  F51.05 QUEtiapine (SEROQUEL) 200 MG tablet   F99       Past Psychiatric History: Generalized anxiety disorder Major depressive disorder Panic disorder Visual hallucinations Meds hx: ZOloft, Zyprexa (dcd for Seroquel) INPT: [facility] 04/2021, 09/2020 [facility] 10/2019, [facility] 06/2021- MDD w/ psychosis  Last visit: 08/2022- Patient doing well, minimizing polypharmacy and adverse risk, dcd Seroquel XR  QHS and decreased Seroquel  QHS to  QHS.  Past Medical History:  Past Medical History:  Diagnosis Date   Anxiety    Chronic hepatitis C 04/27/2020   Depression    HIV (human immunodeficiency virus infection)     Past Surgical History:  Procedure Laterality Date   CATARACT EXTRACTION Left     Family Psychiatric History: Mother and brother have Bipolar disorder    Family History:  Family History  Problem Relation Age of Onset   Mental illness Mother    Bipolar disorder Brother     Social History:  Social History   Socioeconomic History   Marital status: Single    Spouse name: Not on file   Number of children: Not on file   Years of education: Not on file   Highest education level: Not on file  Occupational History   Not on file  Tobacco Use   Smoking status:  Never   Smokeless tobacco: Never  Vaping Use   Vaping Use: Never used  Substance and Sexual Activity   Alcohol use: Yes    Comment: occassionally   Drug use: No   Sexual activity: Yes    Comment: declined condoms  Other Topics Concern   Not on file  Social History Narrative   Not on file   Social Determinants of Health   Financial Resource Strain: Not on file  Food Insecurity: Not on file  Transportation Needs: Not on file  Physical Activity: Not on file  Stress: Not on  file  Social Connections: Not on file    Allergies: No Known Allergies  Metabolic Disorder Labs: Lab Results  Component Value Date   HGBA1C 5.2 07/07/2021   MPG 102.54 07/07/2021   MPG 96.8 05/06/2021   No results found for: "PROLACTIN" Lab Results  Component Value Date   CHOL 174 07/07/2021   TRIG 95 07/07/2021   HDL 32 (L) 07/07/2021   CHOLHDL 5.4 07/07/2021   VLDL 19 07/07/2021   LDLCALC 123 (H) 07/07/2021   LDLCALC 90 05/06/2021   Lab Results  Component Value Date   TSH 1.015 07/07/2021   TSH 1.199 05/06/2021    Therapeutic Level Labs: No results found for: "LITHIUM" No results found for: "VALPROATE" No results found for: "CBMZ"  Current Medications: Current Outpatient Medications  Medication Sig Dispense Refill   valbenazine (INGREZZA) 40 MG capsule Take 1 capsule (40 mg total) by mouth daily. 30 capsule 2   antiseptic oral rinse (BIOTENE) LIQD 15 mLs by Mouth Rinse route as needed for dry mouth.  0   bictegravir-emtricitabine-tenofovir AF (BIKTARVY) 50-200-25 MG TABS tablet Take 1 tablet by mouth daily. 30 tablet 6   busPIRone (BUSPAR) 5 MG tablet TAKE 1 TABLET(5 MG) BY MOUTH TWO TIMES DAILY 60 tablet 3   escitalopram (LEXAPRO) 20 MG tablet Take 1 tablet (20 mg total) by mouth daily. 30 tablet 3   gabapentin (NEURONTIN) 100 MG capsule TAKE 2 CAPSULES(200 MG) BY MOUTH THREE TIMES DAILY 180 capsule 3   QUEtiapine (SEROQUEL) 200 MG tablet TAKE 1 TABLET(300 MG) BY MOUTH AT BEDTIME 30 tablet 3   tamsulosin (FLOMAX) 0.4 MG CAPS capsule Take 1 capsule (0.4 mg total) by mouth daily. 30 capsule 0   No current facility-administered medications for this visit.     Musculoskeletal: Strength & Muscle Tone: within normal limits Gait & Station: normal Patient leans: N/A  Psychiatric Specialty Exam: Review of Systems  Psychiatric/Behavioral:  Negative for hallucinations, sleep disturbance and suicidal ideas.     There were no vitals taken for this visit.There is no  height or weight on file to calculate BMI.  General Appearance: Casual  Eye Contact:  Good  Speech:  Clear and Coherent  Volume:  Normal  Mood:  Euthymic  Affect:  Appropriate  Thought Process:  Coherent  Orientation:  Full (Time, Place, and Person)  Thought Content: Logical   Suicidal Thoughts:  No  Homicidal Thoughts:  No  Memory:  Immediate;   Good Recent;   Good  Judgement:  Good  Insight:  Good  Psychomotor Activity:  TD  Concentration:  Concentration: Good  Recall:  NA  Fund of Knowledge: Good  Language: Good  Akathisia:  NA  Handed:    AIMS (if indicated): not done  Assets:  Communication Skills Desire for Improvement Housing Resilience Social Support Transportation  ADL's:  Intact  Cognition: WNL  Sleep:  Good   Screenings: AIMS  Flowsheet Row [facility] ([facility]) [facility]  AIMS Total Score 13 3 0 0      AUDIT    Flowsheet Row [facility]) [facility] ([facility] ([facility] Ad[facility] (Discharged) from 11[facility]28/2019 in [facility] Ad[facility] ([facility]) [facility]  Alcohol Use Disorder Identification Test Final Score (AUDIT) 0 0 0 0 0      GAD-7    Flowsheet Row Office Visit from 05/11/2022 in [facility] Office Visit from 02/09/2022 in [facility] Office Visit from 11/10/2021 in [facility] Video Visit from 09/11/2021 in [facility] Office Visit from 08/04/2021 in [facility]  Total GAD-7 Score 2 3 3 4 8       PHQ2-9    Flowsheet Row Office Visit from 10/25/2022 in [facility] Disease Office Visit from 05/11/2022 in [facility] Office Visit from 04/23/2022 in [facility] Disease Office Visit from 02/09/2022 in [facility] Office Visit from 12/15/2021 in [facility] for Infectious Disease  PHQ-2 Total Score 0 0 0 0 0      Flowsheet Row Office Visit from 05/11/2022 in [facility] Office Visit from 02/09/2022 in [facility] Office Visit from 11/10/2021 in [facility]  C-SSRS RISK CATEGORY Low Risk No Risk No Risk        Assessment and Plan:  Psychiatrically patient appears to be stable, but on physical he has some nonrhythmic mouth movements that have apparently been chronic.  Patient does have a history of being on an antipsychotic long-term although second generation, he is still at risk for TD which this movement appears to be.  Interestingly, patient's ASL interpreter who has known patient for a long time and noted that it was worse before he went to the hospital in 2022, and has improved with treatment for his mood dysregulation.  Patient did endorse that it started after being on medication, making it less likely that it is a simple tic but still possible.  Would like to try Ingrezza as patient himself thinks that it might be TD as well.   TD versus tic - Start Ingrezza 40mg  daily  Discussed possible adverse side effects and safety planning  MDD, in remission - Change buspar to 5mg  BID - Continue Lexparo 20mg  daily - Continue Gabapentin 200mg  TID -Continue Seroquel 200 mg nightly  Needs  Lipids, A1c, TSH, and CMP (messaged Marcos Eke, FNP to see if he could get these labs with them;; they will do so)  Collaboration of Care: Collaboration of Care:   Patient/Guardian was advised Release of Information must be obtained prior to any record release in order to collaborate their care with an outside provider. Patient/Guardian was advised if they have not already done so to contact the registration department to sign all necessary forms in order for [place] to release information regarding their care.   Consent: Patient/Guardian gives  verbal consent for treatment and assignment of benefits for services provided during this visit. Patient/Guardian expressed understanding and agreed to proceed.   PGY-3 Bobbye Morton, MD 12/13/2022, 2:36 PM

## 2022-12-13 NOTE — Addendum Note (Signed)
Addended by: Eliseo Gum B on: 12/13/2022 04:23 PM   Modules accepted: Level of Service

## 2022-12-16 ENCOUNTER — Telehealth (HOSPITAL_COMMUNITY): Payer: Self-pay | Admitting: *Deleted

## 2022-12-16 NOTE — Telephone Encounter (Signed)
Fax received for Ingrezza  that has been approved until 08/30/23.

## 2022-12-21 IMAGING — CT CT ABD-PELV W/O CM
2 of 4 series · 14 of 46 positions shown, 16 images · non-contrast
Comparison: None.

CLINICAL DATA: Abdominal pain and dysuria

EXAM:
CT ABDOMEN AND PELVIS WITHOUT CONTRAST
TECHNIQUE: Multidetector CT imaging of the abdomen and pelvis was performed
following the standard protocol without oral or IV contrast.

[Series 2: axial st · axial · 0.83mm/px · z∈[+957,+1492]mm · 11 of 121 slices shown, 13 images]
[im 7/121  soft-tissue]
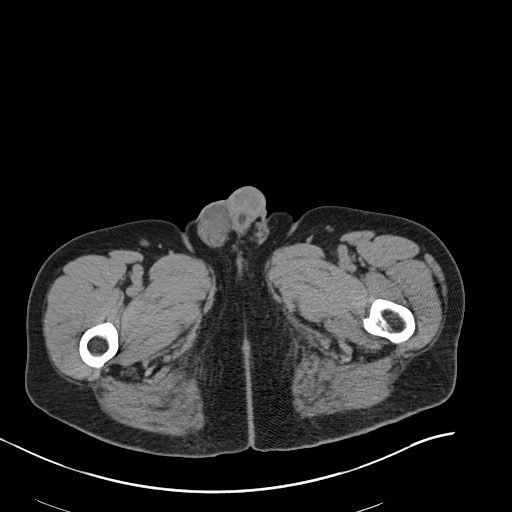
[im 7/121  bone]
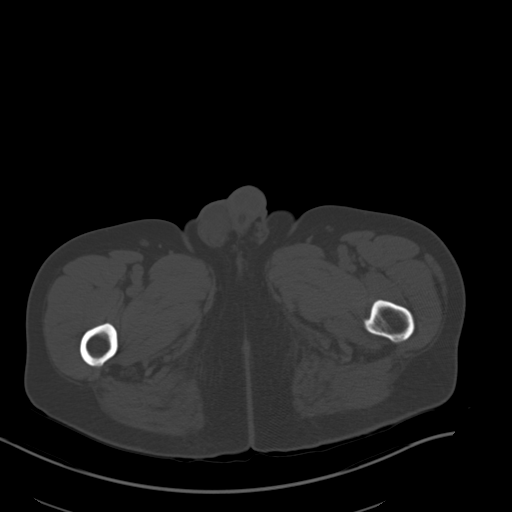
[im 19/121  soft-tissue]
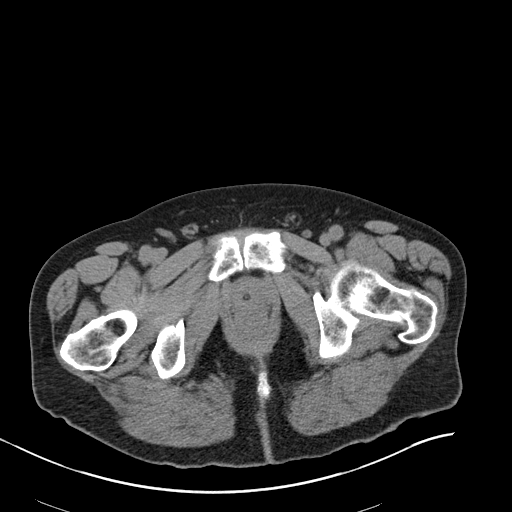
[im 32/121  soft-tissue]
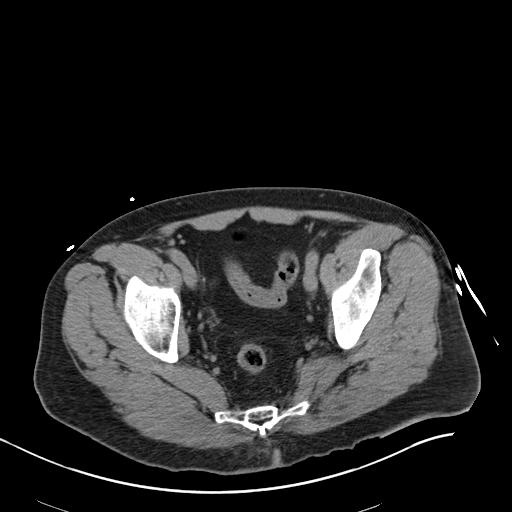
[im 38/121  soft-tissue]
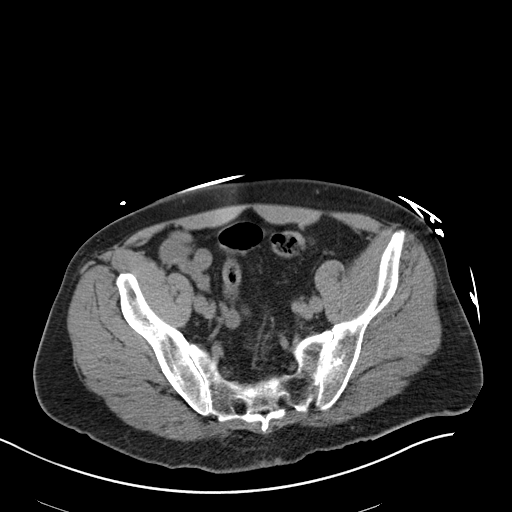
[im 51/121  soft-tissue]
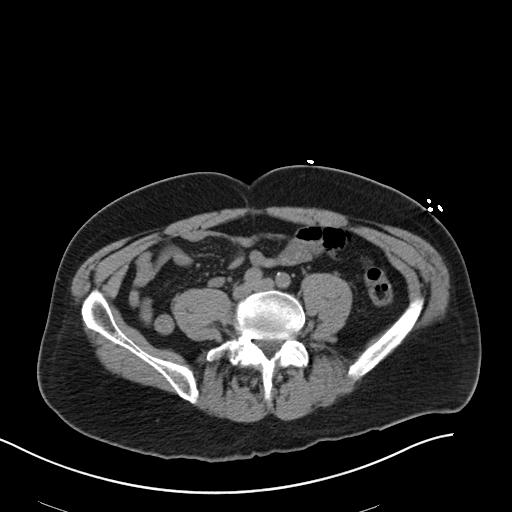
[im 64/121  soft-tissue]
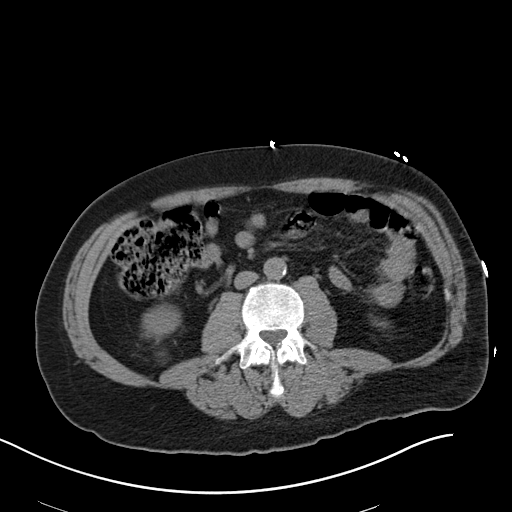
[im 70/121  soft-tissue]
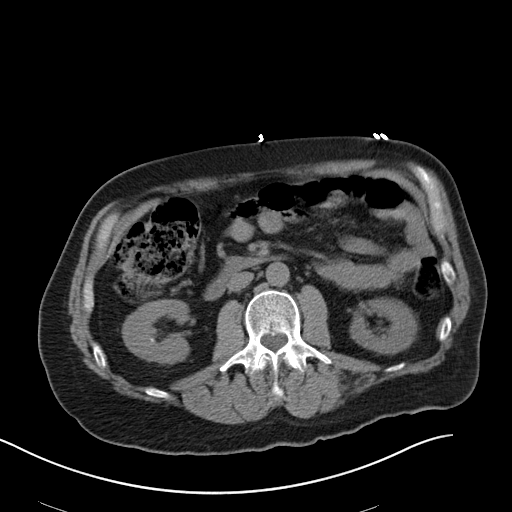
[im 83/121  soft-tissue]
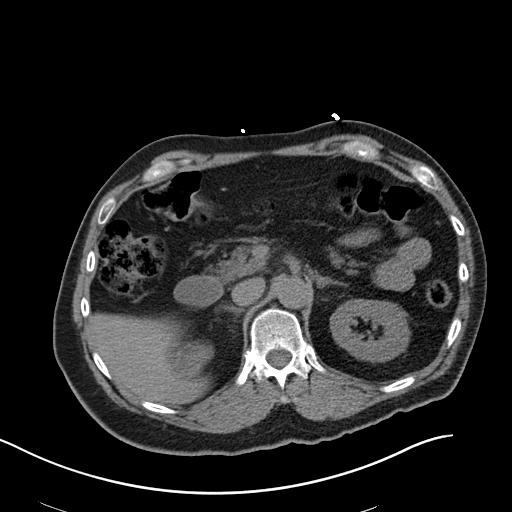
[im 89/121  soft-tissue]
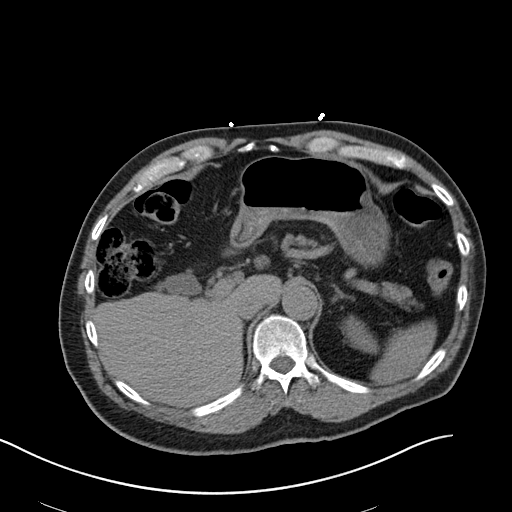
[im 89/121  bone]
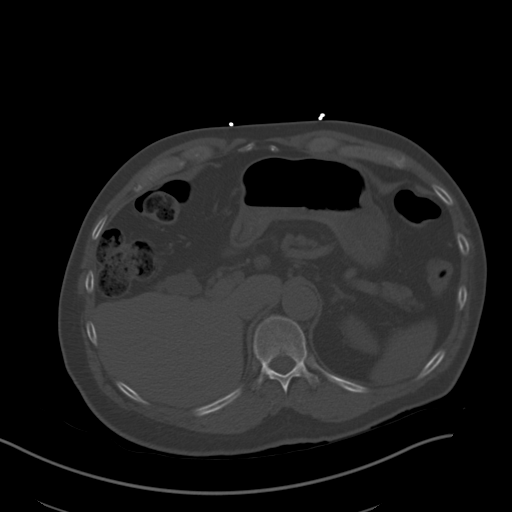
[im 102/121  soft-tissue]
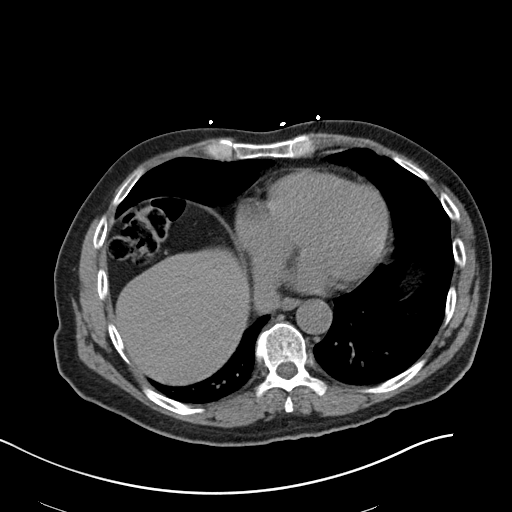
[im 114/121  soft-tissue]
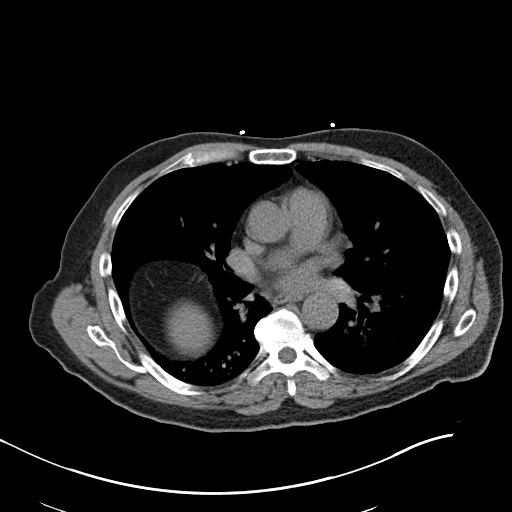

[Series 5: coronal st · coronal · 0.76mm/px · 3 of 151 slices shown]
[im 51/151  soft-tissue]
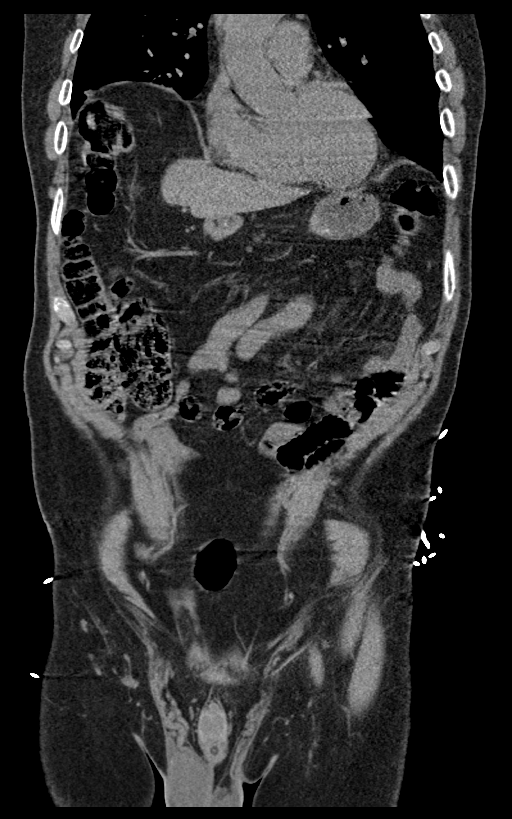
[im 67/151  soft-tissue]
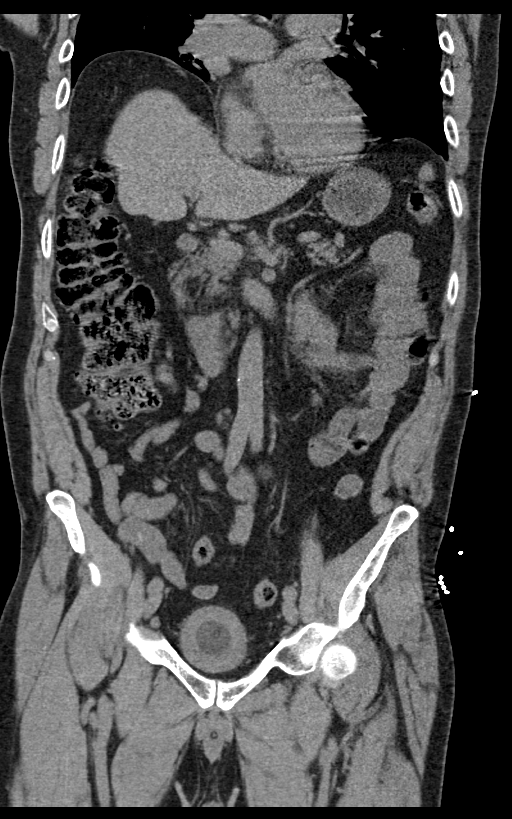
[im 84/151  soft-tissue]
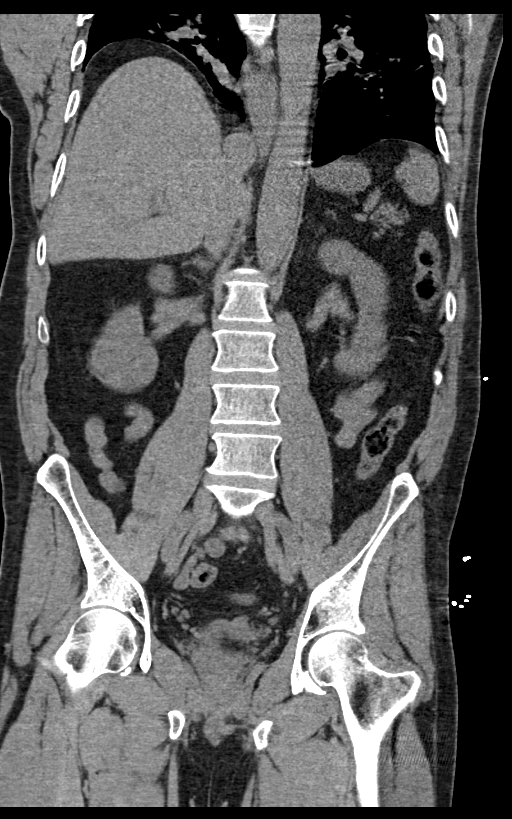

[14 of 46 positions shown; findings below may reference images not displayed]

FINDINGS: Lower chest: There is bibasilar lung atelectatic change. There is no
lung base edema or consolidation.

Hepatobiliary: No focal liver lesions are appreciable on this
noncontrast enhanced study. Gallbladder wall is not appreciably
thickened. There is no evident biliary duct dilatation. Note that
the liver is somewhat superiorly position due to eventration of the
right hemidiaphragm.

Pancreas: No pancreatic mass or inflammatory focus evident.

Spleen: No splenic lesions evident.

Adrenals/Urinary Tract: Adrenals bilaterally appear normal. There is
a cyst arising from the lower pole of the right kidney measuring
x 1.8 cm. There is no appreciable hydronephrosis on either side.
There is a 2 mm calculus in the lower pole of the right kidney with
a nearby 1 mm calculus in this area. On the left, there is a 2 mm
calculus in the lower pole region. There is no appreciable ureteral
calculus on either side. There is a Foley catheter within a
decompressed urinary bladder. The urinary bladder wall appears
thickened. Air within the bladder is likely due to iatrogenic
introduction with Foley placement.

Stomach/Bowel: There is no bowel wall or mesenteric thickening. No
bowel obstruction. The terminal ileum appears normal. There is no
evident free air or portal venous air.

Vascular/Lymphatic: There is no abdominal aortic aneurysm. There is
aortic atherosclerosis. No adenopathy is appreciable in the abdomen
or pelvis.

Reproductive: There are scattered prostatic calculi. Prostate and
seminal vesicles appear normal in size and contour.

Other: Appendix appears normal. No evident abscess or ascites in the
abdomen or pelvis.

Musculoskeletal: There are foci of degenerative change in the lumbar
spine and lower thoracic spine. No blastic or lytic bone lesions. No
intramuscular or abdominal wall lesion evident.
IMPRESSION: 1. Urinary bladder decompressed with Foley catheter. There is
suspected thickening of urinary bladder wall.

2. 1-2 mm nonobstructing calculi in each kidney. No hydronephrosis
or ureteral calculus on either side.

3. No bowel wall thickening or bowel obstruction. No abscess in the
abdomen or pelvis. Appendix appears normal.

4.  Aortic Atherosclerosis ([id number]).

## 2023-02-04 ENCOUNTER — Ambulatory Visit (INDEPENDENT_AMBULATORY_CARE_PROVIDER_SITE_OTHER): Payer: Medicare Other | Admitting: Student in an Organized Health Care Education/Training Program

## 2023-02-04 ENCOUNTER — Encounter (HOSPITAL_COMMUNITY): Payer: Self-pay | Admitting: Student in an Organized Health Care Education/Training Program

## 2023-02-04 DIAGNOSIS — F99 Mental disorder, not otherwise specified: Secondary | ICD-10-CM

## 2023-02-04 DIAGNOSIS — F411 Generalized anxiety disorder: Secondary | ICD-10-CM

## 2023-02-04 DIAGNOSIS — F331 Major depressive disorder, recurrent, moderate: Secondary | ICD-10-CM

## 2023-02-04 DIAGNOSIS — F5105 Insomnia due to other mental disorder: Secondary | ICD-10-CM

## 2023-02-04 DIAGNOSIS — F410 Panic disorder [episodic paroxysmal anxiety] without agoraphobia: Secondary | ICD-10-CM

## 2023-02-04 MED ORDER — GABAPENTIN 100 MG PO CAPS
ORAL_CAPSULE | ORAL | 3 refills | Status: DC
Start: 2023-02-04 — End: 2023-05-17

## 2023-02-04 MED ORDER — QUETIAPINE FUMARATE 200 MG PO TABS
ORAL_TABLET | ORAL | 3 refills | Status: DC
Start: 2023-02-04 — End: 2023-05-17

## 2023-02-04 MED ORDER — ESCITALOPRAM OXALATE 20 MG PO TABS
20.0000 mg | ORAL_TABLET | Freq: Every day | ORAL | 3 refills | Status: DC
Start: 2023-02-04 — End: 2023-06-17

## 2023-02-04 NOTE — Progress Notes (Signed)
BH MD/PA/NP OP Progress Note  02/04/2023 8:08 AM Tyler Horn  MRN:  [id number]  Chief Complaint: No chief complaint on file.  HPI: Tyler Horn is a 62 year old male with a past psychiatric history significant for insomnia, major depressive disorder with psychotic features, and generalized anxiety disorder with panic attacks who presents to [facility] for follow-up and medication management. Language interpretive services were utilized during the encounter due to patient's use of sign language to communicate. Patient is currently being managed on the following medications:    Ingrezza 40mg  daily Buspar 5mg  BID Lexapro 20mg  daily Gabapentin 200mg  TID Seroquel 200mg  QHS  Patient reports that he is not taking the Ingrezza and does not think he is having any issues. Patient reports that he feels "good." Patient reports that he is taking his other medications. Patient reports that he is not having any issues with low mood and reports that he is feeling "stable." Patient reports that he working on losing weight, but he is eating well. Patient reports that he feels like he has good energy. Patient reports that he is averaging 8h of sleep each night. Patient finds joy in movies. Patient reports that he is still working part time a deaf interpreter and is looking for other things and is also playing pickle ball. He may set up a deaf team. Patient denies SI, HI, and VH.   Patient denies panic attacks.  Has a PCP now with Dr. Horald Pollen, 06/10/2023 will be his next appt. And spoke with Tammy Sours and he asked that he will see them to get his labs.   Visit Diagnosis: No diagnosis found.  Past Psychiatric History: Generalized anxiety disorder Major depressive disorder Panic disorder Visual hallucinations Meds hx: ZOloft, Zyprexa (dcd for Seroquel) INPT: [facility] 04/2021, 09/2020 [facility] 10/2019, [facility] 06/2021- MDD w/ psychosis   Last visit: 08/2022- Patient doing well,  minimizing polypharmacy and adverse risk, dcd Seroquel XR 50mg  QHS and decreased Seroquel 300mg  QHS to 200mg  QHS.   11/2022- Concern for TD on this visit, started Ingrezza. Patient anxiety appeared stable, decreased Buspar to BID  Past Medical History:  Past Medical History:  Diagnosis Date   Anxiety    Chronic hepatitis C (HCC) 04/27/2020   Depression    HIV (human immunodeficiency virus infection) (HCC)     Past Surgical History:  Procedure Laterality Date   CATARACT EXTRACTION Left     Family Psychiatric History: Mother and brother have Bipolar disorder   Family History:  Family History  Problem Relation Age of Onset   Mental illness Mother    Bipolar disorder Brother     Social History:  Social History   Socioeconomic History   Marital status: Single    Spouse name: Not on file   Number of children: Not on file   Years of education: Not on file   Highest education level: Not on file  Occupational History   Not on file  Tobacco Use   Smoking status: Never   Smokeless tobacco: Never  Vaping Use   Vaping Use: Never used  Substance and Sexual Activity   Alcohol use: Yes    Comment: occassionally   Drug use: No   Sexual activity: Yes    Comment: declined condoms  Other Topics Concern   Not on file  Social History Narrative   Not on file   Social Determinants of Health   Financial Resource Strain: Not on file  Food Insecurity: Not on file  Transportation Needs:  Not on file  Physical Activity: Not on file  Stress: Not on file  Social Connections: Not on file    Allergies: No Known Allergies  Metabolic Disorder Labs: Lab Results  Component Value Date   HGBA1C 5.2 07/07/2021   MPG 102.54 07/07/2021   MPG 96.8 05/06/2021   No results found for: "PROLACTIN" Lab Results  Component Value Date   CHOL 174 07/07/2021   TRIG 95 07/07/2021   HDL 32 (L) 07/07/2021   CHOLHDL 5.4 07/07/2021   VLDL 19 07/07/2021   LDLCALC 123 (H) 07/07/2021   LDLCALC 90  05/06/2021   Lab Results  Component Value Date   TSH 1.015 07/07/2021   TSH 1.199 05/06/2021    Therapeutic Level Labs: No results found for: "LITHIUM" No results found for: "VALPROATE" No results found for: "CBMZ"  Current Medications: Current Outpatient Medications  Medication Sig Dispense Refill   antiseptic oral rinse (BIOTENE) LIQD 15 mLs by Mouth Rinse route as needed for dry mouth.  0   bictegravir-emtricitabine-tenofovir AF (BIKTARVY) 50-200-25 MG TABS tablet Take 1 tablet by mouth daily. 30 tablet 6   busPIRone (BUSPAR) 5 MG tablet TAKE 1 TABLET(5 MG) BY MOUTH TWO TIMES DAILY 60 tablet 3   escitalopram (LEXAPRO) 20 MG tablet Take 1 tablet (20 mg total) by mouth daily. 30 tablet 3   gabapentin (NEURONTIN) 100 MG capsule TAKE 2 CAPSULES(200 MG) BY MOUTH THREE TIMES DAILY 180 capsule 3   QUEtiapine (SEROQUEL) 200 MG tablet TAKE 1 TABLET(300 MG) BY MOUTH AT BEDTIME 30 tablet 3   tamsulosin (FLOMAX) 0.4 MG CAPS capsule Take 1 capsule (0.4 mg total) by mouth daily. 30 capsule 0   valbenazine (INGREZZA) 40 MG capsule Take 1 capsule (40 mg total) by mouth daily. 30 capsule 2   No current facility-administered medications for this visit.     Musculoskeletal: Strength & Muscle Tone: within normal limits Gait & Station: normal Patient leans: N/A  Psychiatric Specialty Exam: Review of Systems  Psychiatric/Behavioral:  Negative for dysphoric mood, hallucinations, sleep disturbance and suicidal ideas. The patient is not nervous/anxious.     There were no vitals taken for this visit.There is no height or weight on file to calculate BMI.  General Appearance: Casual  Eye Contact:  Good  Speech:  ASL  Volume:  NA  Mood:  Euthymic  Affect:  Appropriate  Thought Process:  Coherent  Orientation:  Full (Time, Place, and Person)  Thought Content: Logical   Suicidal Thoughts:  No  Homicidal Thoughts:  No  Memory:  Immediate;   Good Recent;   Good  Judgement:  Good  Insight:   Good  Psychomotor Activity:  Normal  Concentration:  Concentration: Good  Recall:  NA  Fund of Knowledge: Good  Language: Good  Akathisia:  NA  Handed:    AIMS (if indicated): not done  Assets:  Communication Skills Desire for Improvement Housing Leisure Time Resilience Social Support Transportation  ADL's:  Intact  Cognition: WNL  Sleep:  Good   Screenings: AIMS    Flowsheet Row [facility]) [facility]) [facility]  AIMS Total Score 13 3 0 0      AUDIT    Flowsheet Row [facility]) [facility] (Discharged)  [facility] (Dis[facility]) [facility] (Discharged[facility] from [facility]28/2019 in [facility] Ad[facility] ([facility]) [facility]  Alcohol Use Disorder Identification Test Final Score (AUDIT) 0 0 0 0 0      GAD-7    Flowsheet Row Office Visit from 05/11/2022 in [facility] Office Visit from 02/09/2022 in [facility] Office Visit from 11/10/2021 in [facility] Video Visit from 09/11/2021 in [facility] Office Visit from 08/04/2021 in [facility]  Total GAD-7 Score 2 3 3 4 8       PHQ2-9    Flowsheet Row Office Visit from 10/25/2022 in [facility] Office Visit from 05/11/2022 in [facility] Office Visit from  04/23/2022 in [facility] Office Visit from 02/09/2022 in [facility] Office Visit from 12/15/2021 in [facility] for Infectious Disease  PHQ-2 Total Score 0 0 0 0 0      Flowsheet Row Office Visit from 05/11/2022 in [facility] Office Visit from 02/09/2022 in [facility] Office Visit from 11/10/2021 in [facility]  C-SSRS RISK CATEGORY Low Risk No Risk No Risk        Assessment and Plan:  Patient doing well, he has not required Ingrezza. He is denying any mood changes. Patient ok ot stop buspar, and will monitor his symptoms. Will continue to work on minimizing polypharmacy.   MDD, in remission - Discontinue buspar - Continue Lexparo 20mg  daily - Continue Gabapentin 200mg  TID -Continue Seroquel 200 mg nightly  Will continue to hold on labs, patient will get them done with PCP.  Collaboration of Care: Collaboration of Care:   Patient/Guardian was advised Release of Information must be obtained prior to any record release in order to collaborate their care with an outside provider. Patient/Guardian was advised if they have not already done so to contact the registration department to sign all necessary forms in order for [place] to release information regarding their care.   Consent: Patient/Guardian gives verbal consent for treatment and assignment of benefits for services provided during this visit. Patient/Guardian expressed understanding and agreed to proceed.   PGY-3 Bobbye Morton, MD 02/04/2023, 8:08 AM

## 2023-02-10 NOTE — Progress Notes (Signed)
The 10-year ASCVD risk score (Arnett DK, et al., 2019) is: 9%   Values used to calculate the score:     Age: 62 years     Sex: Male     Is Non-Hispanic African American: No     Diabetic: No     Tobacco smoker: No     Systolic Blood Pressure: 112 mmHg     Is BP treated: No     HDL Cholesterol: 32 mg/dL     Total Cholesterol: 174 mg/dL  Sandie Ano, RN

## 2023-04-11 ENCOUNTER — Encounter: Payer: Self-pay | Admitting: Family

## 2023-04-11 ENCOUNTER — Ambulatory Visit (INDEPENDENT_AMBULATORY_CARE_PROVIDER_SITE_OTHER): Payer: Medicare Other | Admitting: Family

## 2023-04-11 ENCOUNTER — Other Ambulatory Visit: Payer: Self-pay

## 2023-04-11 VITALS — BP 114/75 | HR 63 | Temp 97.8°F | Ht 76.0 in | Wt 200.0 lb

## 2023-04-11 DIAGNOSIS — R197 Diarrhea, unspecified: Secondary | ICD-10-CM | POA: Diagnosis not present

## 2023-04-11 DIAGNOSIS — B20 Human immunodeficiency virus [HIV] disease: Secondary | ICD-10-CM | POA: Diagnosis not present

## 2023-04-11 DIAGNOSIS — Z9189 Other specified personal risk factors, not elsewhere classified: Secondary | ICD-10-CM

## 2023-04-11 DIAGNOSIS — Z21 Asymptomatic human immunodeficiency virus [HIV] infection status: Secondary | ICD-10-CM

## 2023-04-11 DIAGNOSIS — Z0000 Encounter for general adult medical examination without abnormal findings: Secondary | ICD-10-CM

## 2023-04-11 MED ORDER — BIKTARVY 50-200-25 MG PO TABS
1.0000 | ORAL_TABLET | Freq: Every day | ORAL | 6 refills | Status: DC
Start: 2023-04-11 — End: 2024-01-27

## 2023-04-11 NOTE — Patient Instructions (Addendum)
Nice to see you.  We will check your lab work today.  Continue to take your medication daily as prescribed.  Refills have been sent to the pharmacy.  A referral to Gastroenterology has been placed for a colonoscopy and for diarrhea.   If pathogen panel negative will prescribe alternative medication.   Plan for follow up in 6 months or sooner if needed with lab work on the same day.  Have a great day and stay safe!

## 2023-04-11 NOTE — Progress Notes (Signed)
Brief Narrative   Patient ID: Tyler Horn, male    DOB: 15-Mar-1961, 62 y.o.   MRN: [id number]  Mr. Hegge is a 62 year old Caucasian male diagnosed with HIV-1 in 2005 with risk factor of MSM.  CD4 nadir and initial viral load are unknown.  [id number] negative. Came to clinic on nevirapine and Truvada and switched to [place].  No history of opportunistic infection.   Subjective:    Chief Complaint  Patient presents with   Follow-up    Watery diarrhea 8-10 times per day, every other day. Interrupting sleep. Started a few months ago    HIV Positive/AIDS    HPI:  Tyler Horn is a 62 y.o. male with HIV disease last seen on 10/25/22 with well controlled virus and good adherence and tolerance to [employer]. Viral load was undetectable and CD4 count 589. Kidney function and electrolytes within normal ranges. Kruze is hearing impaired and medical interpreter is present to aid in communication.   [name] has been doing well since his last office visit. No problems with getting medications and continues to take Biktarvy as prescribed with no adverse side effects. Has been having diarrhea that has waxed and waned over the past several months believing it started around November with no precipitating events. Has a "crumbly feeling" in his right lower abdomen at times and usually occurs after he eats. No specific trigger foods. Has tired Immodium which has not provided relief. Recently stopped medications with no new medications added. Severity is enough to effect his ability to work and sleep. Does not occur every day with the last being Friday of last week.No previous history of problems or colonoscopy. Has not traveled. 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 Condoms and STD testing offered.  Due for routine dental care which case manager is helping to establish.   Denies fevers, chills, night sweats, headaches, changes in vision, neck  pain/stiffness, nausea, vomiting, lesions or rashes.  No Known Allergies    Outpatient Medications Prior to Visit  Medication Sig Dispense Refill   antiseptic oral rinse (BIOTENE) LIQD 15 mLs by Mouth Rinse route as needed for dry mouth.  0   escitalopram (LEXAPRO) 20 MG tablet Take 1 tablet (20 mg total) by mouth daily. 30 tablet 3   gabapentin (NEURONTIN) 100 MG capsule TAKE 2 CAPSULES(200 MG) BY MOUTH THREE TIMES DAILY 180 capsule 3   QUEtiapine (SEROQUEL) 200 MG tablet TAKE 1 TABLET(300 MG) BY MOUTH AT BEDTIME 30 tablet 3   bictegravir-emtricitabine-tenofovir AF (BIKTARVY) 50-200-25 MG TABS tablet Take 1 tablet by mouth daily. 30 tablet 6   tamsulosin (FLOMAX) 0.4 MG CAPS capsule Take 1 capsule (0.4 mg total) by mouth daily. (Patient not taking: Reported on 04/11/2023) 30 capsule 0   No facility-administered medications prior to visit.     Past Medical History:  Diagnosis Date   Anxiety    Chronic hepatitis C (HCC) 04/27/2020   Depression    HIV (human immunodeficiency virus infection) (HCC)      Past Surgical History:  Procedure Laterality Date   CATARACT EXTRACTION Left       Review of Systems  Constitutional:  Negative for appetite change, chills, fatigue, fever and unexpected weight change.  Eyes:  Negative for visual disturbance.  Respiratory:  Negative for cough, chest tightness, shortness of breath and wheezing.   Cardiovascular:  Negative for chest pain and leg swelling.  Gastrointestinal:  Positive for diarrhea. Negative for abdominal pain,  constipation, nausea and vomiting.  Genitourinary:  Negative for dysuria, flank pain, frequency, genital sores, hematuria and urgency.  Skin:  Negative for rash.  Allergic/Immunologic: Negative for immunocompromised state.  Neurological:  Negative for dizziness and headaches.      Objective:    BP 114/75   Pulse 63   Temp 97.8 F (36.6 C) (Temporal)   Ht 6\' 4"  (1.93 m)   Wt 200 lb (90.7 kg)   SpO2 97%   BMI 24.34  kg/m  Nursing note and vital signs reviewed.  Physical Exam Constitutional:      General: He is not in acute distress.    Appearance: He is well-developed.  Cardiovascular:     Rate and Rhythm: Normal rate and regular rhythm.     Heart sounds: Normal heart sounds.  Pulmonary:     Effort: Pulmonary effort is normal.     Breath sounds: Normal breath sounds.  Abdominal:     General: Abdomen is flat. Bowel sounds are normal. There is no distension.     Palpations: Abdomen is soft. There is no mass.     Tenderness: There is no abdominal tenderness.  Skin:    General: Skin is warm and dry.  Neurological:     Mental Status: He is alert.  Psychiatric:        Mood and Affect: Mood normal.         04/11/2023   10:28 AM 10/25/2022   10:22 AM 05/11/2022    2:19 PM 04/23/2022   10:51 AM 02/09/2022    2:15 PM  Depression screen PHQ 2/9  Decreased Interest 0 0  0   Down, Depressed, Hopeless 0 0  0   PHQ - 2 Score 0 0  0      Information is confidential and restricted. Go to Review Flowsheets to unlock data.       Assessment & Plan:    Patient Active Problem List   Diagnosis Date Noted   MDD (recurrent major depressive disorder) in remission (HCC) 09/09/2022   GAD (generalized anxiety disorder) 07/14/2021   MDD (major depressive disorder) 05/05/2021   Insomnia due to other mental disorder 02/19/2021   Panic disorder 01/07/2021   Severe episode of recurrent major depressive disorder, with psychotic features (HCC) 10/16/2020   Vision changes 07/09/2020   Weight loss 07/09/2020   Idiopathic peripheral neuropathy 04/27/2020   Herpes simplex 04/27/2020   Herpes zoster 04/27/2020   Healthcare maintenance 12/19/2019   Early syphilis, 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   Post-traumatic stress disorder, unspecified 11/28/2019   Sedative, 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   Diarrhea    Major depressive disorder, recurrent, severe with  psychotic features (HCC) 07/13/2017   MDD (major depressive disorder), recurrent, severe, with psychosis (HCC) 07/31/2014   Benzodiazepine misuse 07/31/2014   Human immunodeficiency virus infection (HCC)    Delusional disorder (HCC) 07/30/2014   Suicidal thoughts 07/30/2014   Visual hallucinations    Moderate episode of recurrent major depressive disorder (HCC) 10/03/2013   Generalized anxiety disorder with panic attacks 03/26/2013     Problem List Items Addressed This Visit       Other   Human immunodeficiency virus infection (HCC)    Mackson continues to have well controlled virus with good adherence and tolerance to [employer]. Reviewed previous lab work and discussed plan of care and U equals U. Unlikely Biktarvy contributing to diarrhea. Check lab work. Continue current dose of Biktarvy. Plan for follow up in 6  months or sooner if needed with lab work on the same day.      Relevant Medications   bictegravir-emtricitabine-tenofovir AF (BIKTARVY) 50-200-25 MG TABS tablet   Other Relevant Orders   Hepatitis C RNA quantitative   COMPLETE METABOLIC PANEL WITH GFR   HIV-1 RNA quant-no reflex-bld   T-helper cell (CD4)- ([facility] clinic only)   Diarrhea    Oliver has been having what appears to be waxing and waning chronic diarrhea for almost a year now. Suspect likely functional or alternative as opposed to infectious. Will check GI panel for thoroughness. May consider loperamide to use as needed pending results. GI referral placed for colonoscopy and for additional work up of diarrhea.       Relevant Orders   Ambulatory referral to Gastroenterology   GI pathogen panel by PCR, stool   Healthcare maintenance    Discussed importance of safe sexual practice and condom use. Condoms and STD testing offered.  Reviewed vaccinations Case management setting up dental care.  Referral placed for colonoscopy.       Other Visit Diagnoses     At risk for colon cancer    -  Primary   Relevant  Orders   Ambulatory referral to Gastroenterology        I have discontinued Keric Falero's tamsulosin. I am also having him maintain his antiseptic oral rinse, QUEtiapine, escitalopram, gabapentin, and Biktarvy.   Meds ordered this encounter  Medications   bictegravir-emtricitabine-tenofovir AF (BIKTARVY) 50-200-25 MG TABS tablet    Sig: Take 1 tablet by mouth daily.    Dispense:  30 tablet    Refill:  6    Order Specific Question:   Supervising Provider    Answer:   Judyann Munson [[id number]]     Follow-up: Return in about 6 months (around 10/12/2023), or if symptoms worsen or fail to improve.   Marcos Eke, MSN, FNP-C Nurse Practitioner [facility]ious [facility] Main number: [phone]

## 2023-04-11 NOTE — Assessment & Plan Note (Signed)
Discussed importance of safe sexual practice and condom use. Condoms and STD testing offered.  Reviewed vaccinations Case management setting up dental care.  Referral placed for colonoscopy.

## 2023-04-11 NOTE — Assessment & Plan Note (Signed)
Tyler Horn continues to have well controlled virus with good adherence and tolerance to [employer]. Reviewed previous lab work and discussed plan of care and U equals U. Unlikely Biktarvy contributing to diarrhea. Check lab work. Continue current dose of Biktarvy. Plan for follow up in 6 months or sooner if needed with lab work on the same day.

## 2023-04-11 NOTE — Assessment & Plan Note (Signed)
Tyler Horn has been having what appears to be waxing and waning chronic diarrhea for almost a year now. Suspect likely functional or alternative as opposed to infectious. Will check GI panel for thoroughness. May consider loperamide to use as needed pending results. GI referral placed for colonoscopy and for additional work up of diarrhea.

## 2023-05-06 ENCOUNTER — Encounter (HOSPITAL_COMMUNITY): Payer: Medicare Other | Admitting: Student in an Organized Health Care Education/Training Program

## 2023-05-11 ENCOUNTER — Emergency Department (HOSPITAL_COMMUNITY): Payer: Medicare Other

## 2023-05-11 ENCOUNTER — Encounter (HOSPITAL_COMMUNITY): Payer: Self-pay

## 2023-05-11 ENCOUNTER — Other Ambulatory Visit: Payer: Self-pay

## 2023-05-11 ENCOUNTER — Inpatient Hospital Stay (HOSPITAL_COMMUNITY)
Admission: EM | Admit: 2023-05-11 | Discharge: 2023-05-17 | DRG: 552 | Disposition: A | Discharge status: Skilled Nursing Facility | Payer: Medicare Other | Attending: Internal Medicine | Admitting: Internal Medicine

## 2023-05-11 DIAGNOSIS — R6889 Other general symptoms and signs: Secondary | ICD-10-CM | POA: Diagnosis not present

## 2023-05-11 DIAGNOSIS — E871 Hypo-osmolality and hyponatremia: Secondary | ICD-10-CM | POA: Diagnosis not present

## 2023-05-11 DIAGNOSIS — M545 Low back pain, unspecified: Secondary | ICD-10-CM | POA: Diagnosis not present

## 2023-05-11 DIAGNOSIS — H9190 Unspecified hearing loss, unspecified ear: Secondary | ICD-10-CM | POA: Diagnosis present

## 2023-05-11 DIAGNOSIS — F1390 Sedative, hypnotic, or anxiolytic use, unspecified, uncomplicated: Secondary | ICD-10-CM | POA: Diagnosis present

## 2023-05-11 DIAGNOSIS — Z751 Person awaiting admission to adequate facility elsewhere: Secondary | ICD-10-CM

## 2023-05-11 DIAGNOSIS — S7001XA Contusion of right hip, initial encounter: Secondary | ICD-10-CM

## 2023-05-11 DIAGNOSIS — Z818 Family history of other mental and behavioral disorders: Secondary | ICD-10-CM

## 2023-05-11 DIAGNOSIS — M549 Dorsalgia, unspecified: Secondary | ICD-10-CM | POA: Diagnosis not present

## 2023-05-11 DIAGNOSIS — S32040A Wedge compression fracture of fourth lumbar vertebra, initial encounter for closed fracture: Secondary | ICD-10-CM

## 2023-05-11 DIAGNOSIS — B20 Human immunodeficiency virus [HIV] disease: Secondary | ICD-10-CM | POA: Diagnosis present

## 2023-05-11 DIAGNOSIS — R300 Dysuria: Secondary | ICD-10-CM | POA: Diagnosis not present

## 2023-05-11 DIAGNOSIS — N1831 Chronic kidney disease, stage 3a: Secondary | ICD-10-CM | POA: Diagnosis present

## 2023-05-11 DIAGNOSIS — M62838 Other muscle spasm: Secondary | ICD-10-CM | POA: Diagnosis not present

## 2023-05-11 DIAGNOSIS — F411 Generalized anxiety disorder: Secondary | ICD-10-CM | POA: Diagnosis present

## 2023-05-11 DIAGNOSIS — S32041A Stable burst fracture of fourth lumbar vertebra, initial encounter for closed fracture: Secondary | ICD-10-CM | POA: Diagnosis not present

## 2023-05-11 DIAGNOSIS — N179 Acute kidney failure, unspecified: Secondary | ICD-10-CM | POA: Diagnosis not present

## 2023-05-11 DIAGNOSIS — F331 Major depressive disorder, recurrent, moderate: Secondary | ICD-10-CM

## 2023-05-11 DIAGNOSIS — F329 Major depressive disorder, single episode, unspecified: Secondary | ICD-10-CM | POA: Diagnosis not present

## 2023-05-11 DIAGNOSIS — F5105 Insomnia due to other mental disorder: Secondary | ICD-10-CM

## 2023-05-11 DIAGNOSIS — B182 Chronic viral hepatitis C: Secondary | ICD-10-CM | POA: Diagnosis present

## 2023-05-11 DIAGNOSIS — M25551 Pain in right hip: Secondary | ICD-10-CM | POA: Diagnosis not present

## 2023-05-11 DIAGNOSIS — Z21 Asymptomatic human immunodeficiency virus [HIV] infection status: Secondary | ICD-10-CM | POA: Diagnosis present

## 2023-05-11 DIAGNOSIS — F410 Panic disorder [episodic paroxysmal anxiety] without agoraphobia: Secondary | ICD-10-CM | POA: Diagnosis not present

## 2023-05-11 DIAGNOSIS — M25571 Pain in right ankle and joints of right foot: Secondary | ICD-10-CM | POA: Diagnosis not present

## 2023-05-11 DIAGNOSIS — M4856XA Collapsed vertebra, not elsewhere classified, lumbar region, initial encounter for fracture: Secondary | ICD-10-CM | POA: Diagnosis not present

## 2023-05-11 DIAGNOSIS — Z743 Need for continuous supervision: Secondary | ICD-10-CM | POA: Diagnosis not present

## 2023-05-11 DIAGNOSIS — S32049A Unspecified fracture of fourth lumbar vertebra, initial encounter for closed fracture: Principal | ICD-10-CM | POA: Diagnosis present

## 2023-05-11 DIAGNOSIS — M48061 Spinal stenosis, lumbar region without neurogenic claudication: Secondary | ICD-10-CM | POA: Diagnosis present

## 2023-05-11 DIAGNOSIS — F1311 Sedative, hypnotic or anxiolytic abuse, in remission: Secondary | ICD-10-CM | POA: Diagnosis present

## 2023-05-11 DIAGNOSIS — M5136 Other intervertebral disc degeneration, lumbar region: Secondary | ICD-10-CM | POA: Diagnosis not present

## 2023-05-11 DIAGNOSIS — Z79899 Other long term (current) drug therapy: Secondary | ICD-10-CM | POA: Diagnosis not present

## 2023-05-11 DIAGNOSIS — M25579 Pain in unspecified ankle and joints of unspecified foot: Secondary | ICD-10-CM | POA: Diagnosis not present

## 2023-05-11 MED ORDER — OXYCODONE-ACETAMINOPHEN 5-325 MG PO TABS
1.0000 | ORAL_TABLET | Freq: Once | ORAL | Status: AC
  Administered 2023-05-11: 1 via ORAL
  Filled 2023-05-11: qty 1

## 2023-05-11 MED ORDER — OXYCODONE HCL 5 MG PO TABS: 5.0000 mg | ORAL_TABLET | Freq: Four times a day (QID) | ORAL | 0 refills | Status: DC | PRN

## 2023-05-11 MED ORDER — HYDROCODONE-ACETAMINOPHEN 5-325 MG PO TABS
1.0000 | ORAL_TABLET | Freq: Once | ORAL | Status: AC
  Administered 2023-05-11: 1 via ORAL
  Filled 2023-05-11: qty 1

## 2023-05-11 MED ORDER — GABAPENTIN 100 MG PO CAPS
200.0000 mg | ORAL_CAPSULE | Freq: Once | ORAL | Status: AC
  Administered 2023-05-11: 200 mg via ORAL
  Filled 2023-05-11: qty 2

## 2023-05-11 MED ORDER — QUETIAPINE FUMARATE 100 MG PO TABS
300.0000 mg | ORAL_TABLET | Freq: Once | ORAL | Status: AC
  Administered 2023-05-11: 300 mg via ORAL
  Filled 2023-05-11: qty 3

## 2023-05-11 MED ORDER — BICTEGRAVIR-EMTRICITAB-TENOFOV 50-200-25 MG PO TABS
1.0000 | ORAL_TABLET | Freq: Every day | ORAL | Status: DC
  Administered 2023-05-12 – 2023-05-17 (×6): 1 via ORAL
  Filled 2023-05-11 (×7): qty 1
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
## 2023-05-11 NOTE — Discharge Instructions (Addendum)
You have been seen today for your complaint of back pain, right hip pain. Your imaging showed a compression fracture of one of the vertebra of your low back. Your discharge medications include oxycodone. This is an opioid pain medication. You should only take this medication as needed for severe pain. You should not drive, operate heavy machinery or make important decisions while taking this medication. You should use alternative methods for pain relief while taking this medication including stretching, gentle range of motion, and alternating tylenol and ibuprofen. Home care instructions are as follows:  Wear the back brace when active until your pain resolves Follow up with: Dr. Yetta Barre. He is a Midwife. Call to schedule a follow up visit for next week  Please seek immediate medical care if you develop any of the following symptoms: You have difficulty breathing. Your pain is very bad and it suddenly gets worse. You have numbness, tingling, or weakness in any part of your body. You are unable to empty your bladder. You cannot control when you urinate or have bowel movements. You are unable to move any body part that is below the level of your injury. You have pain in your abdomen or uncontrolled vomiting. You have a warm, tender swelling in your leg. At this time there does not appear to be the presence of an emergent medical condition, however there is always the potential for conditions to change. Please read and follow the below instructions.  Do not take your medicine if  develop an itchy rash, swelling in your mouth or lips, or difficulty breathing; call 911 and seek immediate emergency medical attention if this occurs.  You may review your lab tests and imaging results in their entirety on your MyChart account.  Please discuss all results of fully with your primary care provider and other specialist at your follow-up visit.  Note: Portions of this text may have been transcribed using  voice recognition software. Every effort was made to ensure accuracy; however, inadvertent computerized transcription errors may still be present.
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
## 2023-05-11 NOTE — Progress Notes (Signed)
Orthopedic Tech Progress Note Patient Details:  Tyler Horn [birth year] [id number]  Patient ID: Tyler Horn, male   DOB: 1960/10/19, 62 y.o.   MRN: [id number]  Tyler Horn 05/11/2023, 8:02 PM Tlso brace ordered from [facility].

## 2023-05-12 DIAGNOSIS — S32049A Unspecified fracture of fourth lumbar vertebra, initial encounter for closed fracture: Secondary | ICD-10-CM | POA: Diagnosis present

## 2023-05-12 DIAGNOSIS — S32041A Stable burst fracture of fourth lumbar vertebra, initial encounter for closed fracture: Secondary | ICD-10-CM

## 2023-05-12 DIAGNOSIS — F329 Major depressive disorder, single episode, unspecified: Secondary | ICD-10-CM | POA: Diagnosis not present

## 2023-05-12 LAB — CBC WITH DIFFERENTIAL/PLATELET
Abs Immature Granulocytes: 0.04 10*3/uL (ref 0.00–0.07)
Basophils Absolute: 0 10*3/uL (ref 0.0–0.1)
Basophils Relative: 1 %
Eosinophils Absolute: 0.3 10*3/uL (ref 0.0–0.5)
Eosinophils Relative: 4 %
HCT: 49.1 % (ref 39.0–52.0)
Hemoglobin: 16.9 g/dL (ref 13.0–17.0)
Immature Granulocytes: 1 %
Lymphocytes Relative: 28 %
Lymphs Abs: 2.2 10*3/uL (ref 0.7–4.0)
MCH: 31.1 pg (ref 26.0–34.0)
MCHC: 34.4 g/dL (ref 30.0–36.0)
MCV: 90.4 fL (ref 80.0–100.0)
Monocytes Absolute: 0.7 10*3/uL (ref 0.1–1.0)
Monocytes Relative: 9 %
Neutro Abs: 4.7 10*3/uL (ref 1.7–7.7)
Neutrophils Relative %: 57 %
Platelets: 362 10*3/uL (ref 150–400)
RBC: 5.43 MIL/uL (ref 4.22–5.81)
RDW: 13.4 % (ref 11.5–15.5)
WBC: 8 10*3/uL (ref 4.0–10.5)
nRBC: 0 % (ref 0.0–0.2)

## 2023-05-12 LAB — BASIC METABOLIC PANEL
Anion gap: 9 (ref 5–15)
BUN: 33 mg/dL — ABNORMAL HIGH (ref 8–23)
CO2: 25 mmol/L (ref 22–32)
Calcium: 9.4 mg/dL (ref 8.9–10.3)
Chloride: 100 mmol/L (ref 98–111)
Creatinine, Ser: 1.57 mg/dL — ABNORMAL HIGH (ref 0.61–1.24)
GFR, Estimated: 50 mL/min — ABNORMAL LOW (ref >60)
Glucose, Bld: 106 mg/dL — ABNORMAL HIGH (ref 70–99)
Potassium: 3.6 mmol/L (ref 3.5–5.1)
Sodium: 134 mmol/L — ABNORMAL LOW (ref 135–145)

## 2023-05-12 MED ORDER — ACETAMINOPHEN 650 MG RE SUPP: 650.0000 mg | Freq: Three times a day (TID) | RECTAL | Status: AC

## 2023-05-12 MED ORDER — ONDANSETRON HCL 4 MG PO TABS
4.0000 mg | ORAL_TABLET | Freq: Four times a day (QID) | ORAL | Status: DC | PRN
  Administered 2023-05-15: 4 mg via ORAL
  Filled 2023-05-12: qty 1

## 2023-05-12 MED ORDER — METHOCARBAMOL 1000 MG/10ML IJ SOLN: 500.0000 mg | Freq: Four times a day (QID) | INTRAVENOUS | Status: DC | PRN

## 2023-05-12 MED ORDER — MORPHINE SULFATE (PF) 4 MG/ML IV SOLN: 4.0000 mg | Freq: Once | INTRAVENOUS | Status: DC

## 2023-05-12 MED ORDER — SODIUM CHLORIDE 0.9 % IV SOLN: INTRAVENOUS | Status: DC

## 2023-05-12 MED ORDER — MORPHINE SULFATE (PF) 2 MG/ML IV SOLN
2.0000 mg | INTRAVENOUS | Status: DC | PRN
  Administered 2023-05-13 – 2023-05-16 (×3): 2 mg via INTRAVENOUS
  Filled 2023-05-12 (×4): qty 1

## 2023-05-12 MED ORDER — ENOXAPARIN SODIUM 40 MG/0.4ML IJ SOSY
40.0000 mg | PREFILLED_SYRINGE | INTRAMUSCULAR | Status: DC
  Administered 2023-05-13 – 2023-05-16 (×4): 40 mg via SUBCUTANEOUS
  Filled 2023-05-12 (×5): qty 0.4

## 2023-05-12 MED ORDER — OXYCODONE-ACETAMINOPHEN 5-325 MG PO TABS
1.0000 | ORAL_TABLET | Freq: Once | ORAL | Status: AC
  Administered 2023-05-12: 1 via ORAL
  Filled 2023-05-12: qty 1

## 2023-05-12 MED ORDER — LIDOCAINE 5 % EX PTCH
1.0000 | MEDICATED_PATCH | CUTANEOUS | Status: DC
  Administered 2023-05-12 – 2023-05-16 (×5): 1 via TRANSDERMAL
  Filled 2023-05-12 (×7): qty 1

## 2023-05-12 MED ORDER — ACETAMINOPHEN 325 MG PO TABS: 650.0000 mg | ORAL_TABLET | Freq: Once | ORAL | Status: DC

## 2023-05-12 MED ORDER — ACETAMINOPHEN 500 MG PO TABS
1000.0000 mg | ORAL_TABLET | Freq: Three times a day (TID) | ORAL | Status: AC
  Administered 2023-05-12 – 2023-05-15 (×9): 1000 mg via ORAL
  Filled 2023-05-12 (×9): qty 2

## 2023-05-12 MED ORDER — ONDANSETRON HCL 4 MG/2ML IJ SOLN
4.0000 mg | Freq: Four times a day (QID) | INTRAMUSCULAR | Status: DC | PRN
  Administered 2023-05-17: 4 mg via INTRAVENOUS
  Filled 2023-05-12: qty 2

## 2023-05-12 MED ORDER — MORPHINE SULFATE (PF) 4 MG/ML IV SOLN
4.0000 mg | Freq: Once | INTRAVENOUS | Status: AC
  Administered 2023-05-12: 4 mg via INTRAVENOUS
  Filled 2023-05-12: qty 1

## 2023-05-12 MED ORDER — ESCITALOPRAM OXALATE 20 MG PO TABS
20.0000 mg | ORAL_TABLET | Freq: Every day | ORAL | Status: DC
  Administered 2023-05-12 – 2023-05-17 (×6): 20 mg via ORAL
  Filled 2023-05-12 (×6): qty 1

## 2023-05-12 MED ORDER — SENNA 8.6 MG PO TABS
1.0000 | ORAL_TABLET | Freq: Two times a day (BID) | ORAL | Status: DC
  Administered 2023-05-12 – 2023-05-17 (×6): 8.6 mg via ORAL
  Filled 2023-05-12 (×10): qty 1

## 2023-05-12 MED ORDER — OXYCODONE HCL 5 MG PO TABS
5.0000 mg | ORAL_TABLET | ORAL | Status: DC | PRN
  Administered 2023-05-13 – 2023-05-17 (×8): 5 mg via ORAL
  Filled 2023-05-12 (×8): qty 1
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
## 2023-05-12 NOTE — Discharge Planning (Signed)
Oletta Cohn, RN, BSN, [place] [phone] Pt qualifies for DME [facility]) rolling walker.  DME  ordered through [employer].  Mitch of [facility] notified to deliver DME to pt room prior to D/C home.

## 2023-05-12 NOTE — Care Management (Addendum)
Transition of Care [facility]) - Emergency Department Mini Assessment   Patient Details  Name: Tyler Horn MRN: [id number] Date of Birth: October 06, 1960  Transition of Care [facility]) CM/SW Contact:    Lavenia Atlas, RN Phone Number: 05/12/2023, 1:33 PM   Clinical Narrative: Received TOC consult HH, transportation, DME: walker is too short.  This RNCM presented to bedside patient resting comfortably. Spoke with patient via interpretor, patient reports he is "depressed, overwhelmed and unable to answer any questions." Charge Nurse came over to speak with patient he then agreed to continue discussion re:TOC consulted needs. This RNCM explained frequency of HH services and patient's insurance can be a challenge to accept for services. This RNCM offered HH choice, patient chose any HH agency that will accept his insurance.   Per chart review patient does not have a PCP, and unable to receive [facility] services until PCP has seen patient for follow up.  Contacted the following HH agencies: Angie w/ [facility]: declined unable to accept due to no PCP Morrie Sheldon w/[facility]: declined due to staffing Lynette w/[facility]: declined d/t insurance  [name] has now placed order for TTS assessment.  Per paramedic patient reports he can contact his apartment to get in.     This RNCM spoke with Mitch w/[facility] who reports the walker patient received is the only option available. This RNCM notified paramedic to walk patient at the highest level.  Spoke w/ [facility] they do not have any earlier appointments. Patient will need to keep his  scheduled PCP w/ [facility] on 06/10/23 at 8:15 am.    No additional TOC needs at this time.  Transportation at discharge: taxi voucher  -4:00pm Per Psych [name] patient will not be discharged tonight. This RNCM spoke with C.J with [employer] [phone] who reports the leasing office closes today at 5pm. C.J. reports the office opens at 8:30am tomorrow patient can  call in the am and leasing office will open door for patient.  No additional TOC needs at this time.     ED Mini Assessment: What brought you to the Emergency Department? : c/o right hip and knee pain.     Barrier interventions: coordinating HH services     Interventions which prevented an admission or readmission: Home Health Consult or Services, DME Provided    Patient Contact and Communications     Spoke with: Patient via interpretor Contact Date: 05/12/23,   Contact time: 1333      Patient states their goals for this hospitalization and ongoing recovery are:: feel better CMS Medicare.gov Compare Post Acute Care list provided to:: Patient Choice offered to / list presented to : Patient  Admission diagnosis:  Right Hip and Leg Pain Patient Active Problem List   Diagnosis Date Noted   MDD (recurrent major depressive disorder) in remission (HCC) 09/09/2022   GAD (generalized anxiety disorder) 07/14/2021   MDD (major depressive disorder) 05/05/2021   Insomnia due to other mental disorder 02/19/2021   Panic disorder 01/07/2021   Severe episode of recurrent major depressive disorder, with psychotic features (HCC) 10/16/2020   Vision changes 07/09/2020   Weight loss 07/09/2020   Idiopathic peripheral neuropathy 04/27/2020   Herpes simplex 04/27/2020   Herpes zoster 04/27/2020   Healthcare maintenance 12/19/2019   Early syphilis, 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   Post-traumatic stress disorder, unspecified 11/28/2019   Sedative, 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   Diarrhea    Major depressive disorder, recurrent, severe with psychotic features (  HCC) 07/13/2017   MDD (major depressive disorder), recurrent, severe, with psychosis (HCC) 07/31/2014   Benzodiazepine misuse 07/31/2014   Human immunodeficiency virus infection (HCC)    Delusional disorder (HCC) 07/30/2014   Suicidal thoughts 07/30/2014   Visual hallucinations     Moderate episode of recurrent major depressive disorder (HCC) 10/03/2013   Generalized anxiety disorder with panic attacks 03/26/2013   PCP:  Pcp, No Pharmacy:   [id number] - [place], [place] - [address] AT [facility] OF [address] [place] [id number] Phone: [phone] Fax: [phone]

## 2023-05-12 NOTE — ED Notes (Signed)
Pt was unable to ambulate with PT.
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
## 2023-05-13 DIAGNOSIS — F1311 Sedative, hypnotic or anxiolytic abuse, in remission: Secondary | ICD-10-CM | POA: Diagnosis present

## 2023-05-13 DIAGNOSIS — Z23 Encounter for immunization: Secondary | ICD-10-CM | POA: Diagnosis not present

## 2023-05-13 DIAGNOSIS — S32040A Wedge compression fracture of fourth lumbar vertebra, initial encounter for closed fracture: Secondary | ICD-10-CM

## 2023-05-13 DIAGNOSIS — F1390 Sedative, hypnotic, or anxiolytic use, unspecified, uncomplicated: Secondary | ICD-10-CM | POA: Diagnosis not present

## 2023-05-13 DIAGNOSIS — Z79899 Other long term (current) drug therapy: Secondary | ICD-10-CM | POA: Diagnosis not present

## 2023-05-13 DIAGNOSIS — H913 Deaf nonspeaking, not elsewhere classified: Secondary | ICD-10-CM | POA: Diagnosis not present

## 2023-05-13 DIAGNOSIS — N179 Acute kidney failure, unspecified: Secondary | ICD-10-CM | POA: Diagnosis present

## 2023-05-13 DIAGNOSIS — R531 Weakness: Secondary | ICD-10-CM | POA: Diagnosis not present

## 2023-05-13 DIAGNOSIS — M549 Dorsalgia, unspecified: Secondary | ICD-10-CM | POA: Diagnosis present

## 2023-05-13 DIAGNOSIS — H9190 Unspecified hearing loss, unspecified ear: Secondary | ICD-10-CM | POA: Diagnosis present

## 2023-05-13 DIAGNOSIS — F329 Major depressive disorder, single episode, unspecified: Secondary | ICD-10-CM | POA: Diagnosis present

## 2023-05-13 DIAGNOSIS — R300 Dysuria: Secondary | ICD-10-CM | POA: Diagnosis not present

## 2023-05-13 DIAGNOSIS — F411 Generalized anxiety disorder: Secondary | ICD-10-CM | POA: Diagnosis present

## 2023-05-13 DIAGNOSIS — Z21 Asymptomatic human immunodeficiency virus [HIV] infection status: Secondary | ICD-10-CM | POA: Diagnosis present

## 2023-05-13 DIAGNOSIS — Z818 Family history of other mental and behavioral disorders: Secondary | ICD-10-CM | POA: Diagnosis not present

## 2023-05-13 DIAGNOSIS — S32049A Unspecified fracture of fourth lumbar vertebra, initial encounter for closed fracture: Secondary | ICD-10-CM | POA: Diagnosis not present

## 2023-05-13 DIAGNOSIS — S32049D Unspecified fracture of fourth lumbar vertebra, subsequent encounter for fracture with routine healing: Secondary | ICD-10-CM | POA: Diagnosis not present

## 2023-05-13 DIAGNOSIS — M25571 Pain in right ankle and joints of right foot: Secondary | ICD-10-CM | POA: Diagnosis present

## 2023-05-13 DIAGNOSIS — N390 Urinary tract infection, site not specified: Secondary | ICD-10-CM | POA: Diagnosis not present

## 2023-05-13 DIAGNOSIS — M48061 Spinal stenosis, lumbar region without neurogenic claudication: Secondary | ICD-10-CM | POA: Diagnosis present

## 2023-05-13 DIAGNOSIS — Z751 Person awaiting admission to adequate facility elsewhere: Secondary | ICD-10-CM | POA: Diagnosis not present

## 2023-05-13 DIAGNOSIS — M25551 Pain in right hip: Secondary | ICD-10-CM | POA: Diagnosis present

## 2023-05-13 DIAGNOSIS — E871 Hypo-osmolality and hyponatremia: Secondary | ICD-10-CM | POA: Diagnosis present

## 2023-05-13 DIAGNOSIS — Z743 Need for continuous supervision: Secondary | ICD-10-CM | POA: Diagnosis not present

## 2023-05-13 DIAGNOSIS — M6281 Muscle weakness (generalized): Secondary | ICD-10-CM | POA: Diagnosis not present

## 2023-05-13 DIAGNOSIS — N1831 Chronic kidney disease, stage 3a: Secondary | ICD-10-CM | POA: Diagnosis not present

## 2023-05-13 DIAGNOSIS — B182 Chronic viral hepatitis C: Secondary | ICD-10-CM | POA: Diagnosis present

## 2023-05-13 DIAGNOSIS — M62838 Other muscle spasm: Secondary | ICD-10-CM | POA: Diagnosis present

## 2023-05-13 DIAGNOSIS — Z7401 Bed confinement status: Secondary | ICD-10-CM | POA: Diagnosis not present

## 2023-05-13 LAB — BASIC METABOLIC PANEL
Anion gap: 13 (ref 5–15)
BUN: 33 mg/dL — ABNORMAL HIGH (ref 8–23)
CO2: 23 mmol/L (ref 22–32)
Calcium: 9.2 mg/dL (ref 8.9–10.3)
Chloride: 99 mmol/L (ref 98–111)
Creatinine, Ser: 1.37 mg/dL — ABNORMAL HIGH (ref 0.61–1.24)
GFR, Estimated: 58 mL/min — ABNORMAL LOW (ref >60)
Glucose, Bld: 98 mg/dL (ref 70–99)
Potassium: 3.7 mmol/L (ref 3.5–5.1)
Sodium: 135 mmol/L (ref 135–145)

## 2023-05-13 MED ORDER — QUETIAPINE FUMARATE 50 MG PO TABS
200.0000 mg | ORAL_TABLET | Freq: Once | ORAL | Status: AC
  Administered 2023-05-13: 200 mg via ORAL
  Filled 2023-05-13: qty 4

## 2023-05-13 NOTE — TOC PASRR Note (Signed)
30 Day PASRR Note   Patient Details  Name: Tyler Horn Date of Birth: [birth year]   Transition of Care [facility]) CM/SW Contact:    Amada Jupiter, LCSW Phone Number: 05/13/2023, 4:00 PM  To Whom It May Concern:  Please be advised that this patient will require a short-term nursing home stay - anticipated 30 days or less for rehabilitation and strengthening.   The plan is for return home.

## 2023-05-13 NOTE — Plan of Care (Signed)
?  Problem: Activity: ?Goal: Risk for activity intolerance will decrease ?Outcome: Progressing ?  ?Problem: Safety: ?Goal: Ability to remain free from injury will improve ?Outcome: Progressing ?  ?Problem: Pain Managment: ?Goal: General experience of comfort will improve ?Outcome: Progressing ?  ?
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
## 2023-05-13 NOTE — Plan of Care (Signed)
Discuss and review plan of care with patient/family

## 2023-05-13 NOTE — [place] FL2 (Signed)
MEDICAID [id number] LEVEL OF CARE FORM     IDENTIFICATION  Patient Name: Tyler Horn Birthdate: 1960/11/18 Sex: male Admission Date (Current Location): 05/11/2023  [facility] and [place] Number:  Producer, television/film/video and Address:  [facility],  [name] [place], [place] [address]      Provider Number: [id number]  Attending Physician Name and Address:  Barnetta Chapel, MD  Relative Name and Phone Number:  mother, [name] @ [phone]    Current Level of Care: Hospital Recommended Level of Care: Skilled Nursing Facility Prior Approval Number:    Date Approved/Denied:   PASRR Number: pending  Discharge Plan: SNF    Current Diagnoses: Patient Active Problem List   Diagnosis Date Noted   L4 vertebral fracture (HCC) 05/12/2023   MDD (recurrent major depressive disorder) in remission (HCC) 09/09/2022   GAD (generalized anxiety disorder) 07/14/2021   MDD (major depressive disorder) 05/05/2021   Insomnia due to other mental disorder 02/19/2021   Panic disorder 01/07/2021   Severe episode of recurrent major depressive disorder, with psychotic features (HCC) 10/16/2020   Vision changes 07/09/2020   Weight loss 07/09/2020   Idiopathic peripheral neuropathy 04/27/2020   Herpes simplex 04/27/2020   Herpes zoster 04/27/2020   Healthcare maintenance 12/19/2019   Early syphilis, 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   Post-traumatic stress disorder, unspecified 11/28/2019   Sedative, 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   Diarrhea    Major depressive disorder, recurrent, severe with psychotic features (HCC) 07/13/2017   MDD (major depressive disorder), recurrent, severe, with psychosis (HCC) 07/31/2014   Benzodiazepine misuse 07/31/2014   Human immunodeficiency virus infection (HCC)    Delusional disorder (HCC) 07/30/2014   Suicidal thoughts 07/30/2014   Visual hallucinations    Moderate episode of recurrent major  depressive disorder (HCC) 10/03/2013   Generalized anxiety disorder with panic attacks 03/26/2013    Orientation RESPIRATION BLADDER Height & Weight     Self, Time, Situation, Place  Normal Continent Weight: 198 lb 6.6 oz (90 kg) Height:  6\' 4"  (193 cm)  BEHAVIORAL SYMPTOMS/MOOD NEUROLOGICAL BOWEL NUTRITION STATUS      Continent Diet (regular)  AMBULATORY STATUS COMMUNICATION OF NEEDS Skin   Limited Assist Verbally Normal                       Personal Care Assistance Level of Assistance  Bathing, Dressing Bathing Assistance: Limited assistance   Dressing Assistance: Limited assistance     Functional Limitations Info  Sight, Hearing, Speech Sight Info: Adequate Hearing Info: Impaired (pt is Deaf; uses ASL) Speech Info: Adequate    SPECIAL CARE FACTORS FREQUENCY  PT (By licensed PT), OT (By licensed OT)     PT Frequency: 5x/wk OT Frequency: 5x/wk            Contractures Contractures Info: Not present    Additional Factors Info  Code Status, Allergies, Psychotropic Code Status Info: Full Allergies Info: NKDA Psychotropic Info: see MAR         Current Medications (05/13/2023):  This is the current hospital active medication list Current Facility-Administered Medications  Medication Dose Route Frequency Provider Last Rate Last Admin   0.9 %  sodium chloride infusion   Intravenous Continuous Kc, Ramesh, MD 75 mL/hr at 05/12/23 2140 New Bag at 05/12/23 2140   acetaminophen (TYLENOL) tablet 1,000 mg  1,000 mg Oral TID Lanae Boast, MD   1,000 mg at 05/13/23 0935   Or   acetaminophen (  TYLENOL) suppository 650 mg  650 mg Rectal TID Lanae Boast, MD       bictegravir-emtricitabine-tenofovir AF (BIKTARVY) 50-200-25 MG per tablet 1 tablet  1 tablet Oral Daily Michelle Piper, PA-C   1 tablet at 05/13/23 1014   enoxaparin (LOVENOX) injection 40 mg  40 mg Subcutaneous Q24H Kc, Ramesh, MD       escitalopram (LEXAPRO) tablet 20 mg  20 mg Oral Daily Kc, Ramesh, MD   20  mg at 05/13/23 0935   lidocaine (LIDODERM) 5 % 1 patch  1 patch Transdermal Q24H Kc, Dayna Barker, MD   1 patch at 05/12/23 1942   methocarbamol (ROBAXIN) 500 mg in dextrose 5 % 50 mL IVPB  500 mg Intravenous Q6H PRN Kc, Ramesh, MD       morphine (PF) 2 MG/ML injection 2 mg  2 mg Intravenous Q4H PRN Kc, Ramesh, MD   2 mg at 05/13/23 1253   ondansetron (ZOFRAN) tablet 4 mg  4 mg Oral Q6H PRN Kc, Ramesh, MD       Or   ondansetron (ZOFRAN) injection 4 mg  4 mg Intravenous Q6H PRN Kc, Ramesh, MD       oxyCODONE (Oxy IR/ROXICODONE) immediate release tablet 5 mg  5 mg Oral Q4H PRN Kc, Ramesh, MD   5 mg at 05/13/23 0904   senna (SENOKOT) tablet 8.6 mg  1 tablet Oral BID Lanae Boast, MD   8.6 mg at 05/13/23 0935     Discharge Medications: Please see discharge summary for a list of discharge medications.  Relevant Imaging Results:  Relevant Lab Results:   Additional Information SS# [id number]  Amada Jupiter, LCSW

## 2023-05-13 NOTE — Progress Notes (Signed)
The patient is requesting that Seroquel 200 mg hs be ordered. Messaged Anthoney Harada.
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
## 2023-05-14 ENCOUNTER — Encounter (HOSPITAL_COMMUNITY): Payer: Self-pay | Admitting: Internal Medicine

## 2023-05-14 DIAGNOSIS — N1831 Chronic kidney disease, stage 3a: Secondary | ICD-10-CM | POA: Diagnosis not present

## 2023-05-14 DIAGNOSIS — F411 Generalized anxiety disorder: Secondary | ICD-10-CM

## 2023-05-14 DIAGNOSIS — S32049A Unspecified fracture of fourth lumbar vertebra, initial encounter for closed fracture: Secondary | ICD-10-CM

## 2023-05-14 DIAGNOSIS — F329 Major depressive disorder, single episode, unspecified: Secondary | ICD-10-CM

## 2023-05-14 DIAGNOSIS — M549 Dorsalgia, unspecified: Secondary | ICD-10-CM | POA: Diagnosis not present

## 2023-05-14 DIAGNOSIS — F410 Panic disorder [episodic paroxysmal anxiety] without agoraphobia: Secondary | ICD-10-CM

## 2023-05-14 MED ORDER — METHOCARBAMOL 500 MG PO TABS
500.0000 mg | ORAL_TABLET | Freq: Three times a day (TID) | ORAL | Status: DC | PRN
  Administered 2023-05-14 – 2023-05-17 (×4): 500 mg via ORAL
  Filled 2023-05-14 (×5): qty 1

## 2023-05-14 MED ORDER — QUETIAPINE FUMARATE 50 MG PO TABS
200.0000 mg | ORAL_TABLET | Freq: Every day | ORAL | Status: AC
  Administered 2023-05-14 – 2023-05-15 (×2): 200 mg via ORAL
  Filled 2023-05-14 (×2): qty 4
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
## 2023-05-14 NOTE — Assessment & Plan Note (Addendum)
Seen by psych. Placed back on lexapro. Pt asking for ativan. But since pt already prescribed oxycodone and IV robaxin, I have declined to start ativan. High risk for respiratory compromise with opiates and IV robaxin already ordered. Pt also with hx of benzo misuse([facility] admission 2015)

## 2023-05-14 NOTE — Assessment & Plan Note (Signed)
Stable. On biktarvy

## 2023-05-14 NOTE — Subjective & Objective (Signed)
Pt seen and examined. Video interpretor used. Stable overnight. Awaiting SNF placement. No new issues.

## 2023-05-14 NOTE — Assessment & Plan Note (Signed)
On oxycodone prn and PO robaxin prn.

## 2023-05-14 NOTE — Assessment & Plan Note (Signed)
On lexapro.
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
## 2023-05-14 NOTE — Plan of Care (Signed)

## 2023-05-14 NOTE — Progress Notes (Signed)
The patient is requesting home med Seroquel 200 mg to be ordered. Patient Is A&O. Messaged Chinita Greenland

## 2023-05-14 NOTE — Assessment & Plan Note (Signed)
Hx of benzo misuse in 2015 during [facility] admission. Do not prescribe benzos.

## 2023-05-14 NOTE — Assessment & Plan Note (Signed)
At his baseline Scr. Stop IVF.

## 2023-05-14 NOTE — Assessment & Plan Note (Signed)
Admitted for pain control. Pt had been seen by neurosurgery on 05-11-2023 by Dr. Yetta Barre. They are planning conservative management at present and want to see him in the neurosurgery office in the next week with repeat plain films. Pt to continue wearing back brace.  He will need SNF placement. CM working on this. Hopefully can DC to SNF on Monday.

## 2023-05-15 DIAGNOSIS — M549 Dorsalgia, unspecified: Secondary | ICD-10-CM | POA: Diagnosis not present

## 2023-05-15 DIAGNOSIS — Z21 Asymptomatic human immunodeficiency virus [HIV] infection status: Secondary | ICD-10-CM

## 2023-05-15 DIAGNOSIS — S32049A Unspecified fracture of fourth lumbar vertebra, initial encounter for closed fracture: Secondary | ICD-10-CM | POA: Diagnosis not present

## 2023-05-15 DIAGNOSIS — N1831 Chronic kidney disease, stage 3a: Secondary | ICD-10-CM | POA: Diagnosis not present

## 2023-05-15 DIAGNOSIS — F411 Generalized anxiety disorder: Secondary | ICD-10-CM | POA: Diagnosis not present

## 2023-05-15 MED ORDER — ENSURE ENLIVE PO LIQD
237.0000 mL | Freq: Two times a day (BID) | ORAL | Status: DC
  Administered 2023-05-16 (×2): 237 mL via ORAL

## 2023-05-15 MED ORDER — ENSURE ENLIVE PO LIQD
237.0000 mL | Freq: Two times a day (BID) | ORAL | Status: DC
  Administered 2023-05-15: 237 mL via ORAL

## 2023-05-15 NOTE — Plan of Care (Signed)
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
## 2023-05-16 DIAGNOSIS — R300 Dysuria: Secondary | ICD-10-CM | POA: Diagnosis not present

## 2023-05-16 DIAGNOSIS — F1390 Sedative, hypnotic, or anxiolytic use, unspecified, uncomplicated: Secondary | ICD-10-CM | POA: Diagnosis not present

## 2023-05-16 DIAGNOSIS — S32049A Unspecified fracture of fourth lumbar vertebra, initial encounter for closed fracture: Secondary | ICD-10-CM | POA: Diagnosis not present

## 2023-05-16 DIAGNOSIS — N1831 Chronic kidney disease, stage 3a: Secondary | ICD-10-CM | POA: Diagnosis not present

## 2023-05-16 LAB — URINALYSIS, W/ REFLEX TO CULTURE (INFECTION SUSPECTED)
Bilirubin Urine: NEGATIVE
Glucose, UA: NEGATIVE mg/dL
Hgb urine dipstick: NEGATIVE
Ketones, ur: NEGATIVE mg/dL
Nitrite: NEGATIVE
Protein, ur: 30 mg/dL — AB
Specific Gravity, Urine: 1.012 (ref 1.005–1.030)
WBC, UA: >50 WBC/hpf (ref 0–5)
pH: 7 (ref 5.0–8.0)

## 2023-05-16 MED ORDER — QUETIAPINE FUMARATE 50 MG PO TABS
200.0000 mg | ORAL_TABLET | Freq: Every day | ORAL | Status: DC
  Administered 2023-05-16: 200 mg via ORAL
  Filled 2023-05-16: qty 4

## 2023-05-16 MED ORDER — CEPHALEXIN 500 MG PO CAPS
500.0000 mg | ORAL_CAPSULE | Freq: Two times a day (BID) | ORAL | Status: DC
  Administered 2023-05-16 – 2023-05-17 (×2): 500 mg via ORAL
  Filled 2023-05-16 (×2): qty 1

## 2023-05-16 MED ORDER — CEPHALEXIN 500 MG PO CAPS: 500.0000 mg | ORAL_CAPSULE | Freq: Two times a day (BID) | ORAL | Status: DC

## 2023-05-16 MED ORDER — DIPHENHYDRAMINE HCL 50 MG/ML IJ SOLN
25.0000 mg | Freq: Once | INTRAMUSCULAR | Status: AC
  Administered 2023-05-16: 25 mg via INTRAVENOUS
  Filled 2023-05-16: qty 1

## 2023-05-16 NOTE — NC FL2 (Signed)
[place] MEDICAID FL2 LEVEL OF CARE FORM     IDENTIFICATION  Patient Name: Tyler Horn Birthdate: 04/22/1961 Sex: male Admission Date (Current Location): 05/11/2023  [facility] and [place] Number:  Producer, television/film/video and Address:  [facility],  [name] [place], [place] [address]      Provider Number: [id number]  Attending Physician Name and Address:  Carollee Herter, DO  Relative Name and Phone Number:  mother, [name] @ [phone]    Current Level of Care: Hospital Recommended Level of Care: Skilled Nursing Facility Prior Approval Number:    Date Approved/Denied:   PASRR Number: [id number]  Discharge Plan: SNF    Current Diagnoses: Patient Active Problem List   Diagnosis Date Noted   CKD stage 3a, GFR 45-59 ml/min (HCC) - baseline SCr 1.3-1.4 05/14/2023   Severe back pain 05/13/2023   L4 vertebral fracture (HCC) 05/12/2023   MDD (recurrent major depressive disorder) in remission (HCC) 09/09/2022   GAD (generalized anxiety disorder) 07/14/2021   MDD (major depressive disorder) 05/05/2021   Insomnia due to other mental disorder 02/19/2021   Panic disorder 01/07/2021   Severe episode of recurrent major depressive disorder, with psychotic features (HCC) 10/16/2020   Vision changes 07/09/2020   Weight loss 07/09/2020   Idiopathic peripheral neuropathy 04/27/2020   Herpes simplex 04/27/2020   Herpes zoster 04/27/2020   Healthcare maintenance 12/19/2019   Early syphilis, 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   Post-traumatic stress disorder, unspecified 11/28/2019   Major depressive disorder, recurrent, severe with psychotic features (HCC) 07/13/2017   MDD (major depressive disorder), recurrent, severe, with psychosis (HCC) 07/31/2014   Benzodiazepine misuse 07/31/2014   Human immunodeficiency virus infection (HCC)    Moderate episode of recurrent major depressive disorder (HCC) 10/03/2013   Generalized anxiety disorder with panic attacks 03/26/2013    Orientation  RESPIRATION BLADDER Height & Weight     Self, Time, Situation, Place  Normal Continent Weight: 198 lb 6.6 oz (90 kg) Height:  6\' 4"  (193 cm)  BEHAVIORAL SYMPTOMS/MOOD NEUROLOGICAL BOWEL NUTRITION STATUS      Continent Diet (regular)  AMBULATORY STATUS COMMUNICATION OF NEEDS Skin   Limited Assist Verbally Normal                       Personal Care Assistance Level of Assistance  Bathing, Dressing Bathing Assistance: Limited assistance   Dressing Assistance: Limited assistance     Functional Limitations Info  Sight, Hearing, Speech Sight Info: Adequate Hearing Info: Impaired (pt is Deaf; uses ASL) Speech Info: Adequate    SPECIAL CARE FACTORS FREQUENCY  PT (By licensed PT), OT (By licensed OT)     PT Frequency: 5x/wk OT Frequency: 5x/wk            Contractures Contractures Info: Not present    Additional Factors Info  Code Status, Allergies, Psychotropic Code Status Info: Full Allergies Info: NKDA Psychotropic Info: see MAR         Current Medications (05/16/2023):  This is the current hospital active medication list Current Facility-Administered Medications  Medication Dose Route Frequency Provider Last Rate Last Admin   bictegravir-emtricitabine-tenofovir AF (BIKTARVY) 50-200-25 MG per tablet 1 tablet  1 tablet Oral Daily Michelle Piper, PA-C   1 tablet at 05/16/23 0848   enoxaparin (LOVENOX) injection 40 mg  40 mg Subcutaneous Q24H Kc, Ramesh, MD   40 mg at 05/15/23 2001   escitalopram (LEXAPRO) tablet 20 mg  20 mg Oral Daily Kc, Ramesh, MD   20 mg  at 05/16/23 0849   feeding supplement (ENSURE ENLIVE / ENSURE PLUS) liquid 237 mL  237 mL Oral BID BM Carollee Herter, DO   237 mL at 05/16/23 0849   feeding supplement (ENSURE ENLIVE / ENSURE PLUS) liquid 237 mL  237 mL Oral BID BM Carollee Herter, DO   237 mL at 05/15/23 1705   lidocaine (LIDODERM) 5 % 1 patch  1 patch Transdermal Q24H Kc, Dayna Barker, MD   1 patch at 05/16/23 0021   methocarbamol (ROBAXIN) tablet 500 mg   500 mg Oral Q8H PRN Carollee Herter, DO   500 mg at 05/16/23 0538   morphine (PF) 2 MG/ML injection 2 mg  2 mg Intravenous Q4H PRN Lanae Boast, MD   2 mg at 05/16/23 0854   ondansetron (ZOFRAN) tablet 4 mg  4 mg Oral Q6H PRN Kc, Dayna Barker, MD   4 mg at 05/15/23 1951   Or   ondansetron (ZOFRAN) injection 4 mg  4 mg Intravenous Q6H PRN Kc, Ramesh, MD       oxyCODONE (Oxy IR/ROXICODONE) immediate release tablet 5 mg  5 mg Oral Q4H PRN Kc, Ramesh, MD   5 mg at 05/16/23 0538   senna (SENOKOT) tablet 8.6 mg  1 tablet Oral BID Lanae Boast, MD   8.6 mg at 05/14/23 1004     Discharge Medications: Please see discharge summary for a list of discharge medications.  Relevant Imaging Results:  Relevant Lab Results:   Additional Information SS# [id number];  PLEASE NOTE THAT PT IS DEAF - USES SIGN LANGUAGE  Garnie Borchardt, LCSW

## 2023-05-16 NOTE — Assessment & Plan Note (Signed)
Start empiric keflex 500 mg bid x 5 days  Awaiting urine culture. Call [phone](microbiology) to followup on urine cultures.
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
## 2023-05-16 NOTE — Plan of Care (Signed)
Problem: Activity: Goal: Risk for activity intolerance will decrease Outcome: Progressing   Problem: Nutrition: Goal: Adequate nutrition will be maintained Outcome: Progressing   Problem: Pain Managment: Goal: General experience of comfort will improve Outcome: Progressing   Problem: Safety: Goal: Ability to remain free from injury will improve Outcome: Progressing

## 2023-05-16 NOTE — Progress Notes (Signed)
Patient refused his stool softener last night because he expressed it makes him have diarrhea.

## 2023-05-16 NOTE — TOC Progression Note (Signed)
Transition of Care [facility]) - Progression Note    Patient Details  Name: Moua Broski MRN: [id number] Date of Birth: December 14, 1960  [facility]) CM/SW Contact  Amada Jupiter, LCSW Phone Number: 05/16/2023, 1:43 PM  Clinical Narrative:     Met with pt (with stratus interpreter assist) to review SNF bed offers and he has accepted bed with [facility] who can admit pt tomorrow.   Have begun insurance authorization.  Pt asks that I contact his mother to provide update.  Called mother who was quite upset that she was not aware pt has been hospitalized.  She is requesting information about his condition.  I followed back up with pt who is agreeable with MD to call mother to update.    Expected Discharge Plan: Skilled Nursing Facility Barriers to Discharge: Continued Medical Work up  Expected Discharge Plan and Services In-house Referral: Clinical Social Work     Living arrangements for the past 2 months: Single Family Home                 DME Arranged: N/A DME Agency: NA                   Social Determinants of Health (SDOH) Interventions SDOH Screenings   Food Insecurity: No Food Insecurity (05/13/2023)  Housing: Low Risk  (05/13/2023)  Transportation Needs: No Transportation Needs (05/13/2023)  Utilities: Not At Risk (05/13/2023)  Alcohol Screen: Low Risk  (07/06/2021)  Depression (PHQ2-9): Low Risk  (04/11/2023)  Tobacco Use: Low Risk  (05/11/2023)    Readmission Risk Interventions     No data to display

## 2023-05-17 DIAGNOSIS — R531 Weakness: Secondary | ICD-10-CM | POA: Diagnosis not present

## 2023-05-17 DIAGNOSIS — N1831 Chronic kidney disease, stage 3a: Secondary | ICD-10-CM | POA: Diagnosis not present

## 2023-05-17 DIAGNOSIS — R300 Dysuria: Secondary | ICD-10-CM | POA: Diagnosis not present

## 2023-05-17 DIAGNOSIS — R52 Pain, unspecified: Secondary | ICD-10-CM | POA: Diagnosis not present

## 2023-05-17 DIAGNOSIS — N390 Urinary tract infection, site not specified: Secondary | ICD-10-CM | POA: Diagnosis not present

## 2023-05-17 DIAGNOSIS — E46 Unspecified protein-calorie malnutrition: Secondary | ICD-10-CM | POA: Diagnosis not present

## 2023-05-17 DIAGNOSIS — M549 Dorsalgia, unspecified: Secondary | ICD-10-CM | POA: Diagnosis not present

## 2023-05-17 DIAGNOSIS — R42 Dizziness and giddiness: Secondary | ICD-10-CM | POA: Diagnosis not present

## 2023-05-17 DIAGNOSIS — M6281 Muscle weakness (generalized): Secondary | ICD-10-CM | POA: Diagnosis not present

## 2023-05-17 DIAGNOSIS — F411 Generalized anxiety disorder: Secondary | ICD-10-CM | POA: Diagnosis not present

## 2023-05-17 DIAGNOSIS — H913 Deaf nonspeaking, not elsewhere classified: Secondary | ICD-10-CM | POA: Diagnosis not present

## 2023-05-17 DIAGNOSIS — E860 Dehydration: Secondary | ICD-10-CM | POA: Diagnosis not present

## 2023-05-17 DIAGNOSIS — S32049A Unspecified fracture of fourth lumbar vertebra, initial encounter for closed fracture: Secondary | ICD-10-CM | POA: Diagnosis not present

## 2023-05-17 DIAGNOSIS — F1390 Sedative, hypnotic, or anxiolytic use, unspecified, uncomplicated: Secondary | ICD-10-CM | POA: Diagnosis not present

## 2023-05-17 DIAGNOSIS — S32040A Wedge compression fracture of fourth lumbar vertebra, initial encounter for closed fracture: Secondary | ICD-10-CM | POA: Diagnosis not present

## 2023-05-17 DIAGNOSIS — N182 Chronic kidney disease, stage 2 (mild): Secondary | ICD-10-CM | POA: Diagnosis not present

## 2023-05-17 DIAGNOSIS — Z743 Need for continuous supervision: Secondary | ICD-10-CM | POA: Diagnosis not present

## 2023-05-17 DIAGNOSIS — Z23 Encounter for immunization: Secondary | ICD-10-CM | POA: Diagnosis not present

## 2023-05-17 DIAGNOSIS — R197 Diarrhea, unspecified: Secondary | ICD-10-CM | POA: Diagnosis not present

## 2023-05-17 DIAGNOSIS — S32049D Unspecified fracture of fourth lumbar vertebra, subsequent encounter for fracture with routine healing: Secondary | ICD-10-CM | POA: Diagnosis not present

## 2023-05-17 DIAGNOSIS — K5900 Constipation, unspecified: Secondary | ICD-10-CM | POA: Diagnosis not present

## 2023-05-17 DIAGNOSIS — Z7401 Bed confinement status: Secondary | ICD-10-CM | POA: Diagnosis not present

## 2023-05-17 DIAGNOSIS — H9190 Unspecified hearing loss, unspecified ear: Secondary | ICD-10-CM | POA: Diagnosis not present

## 2023-05-17 MED ORDER — METHOCARBAMOL 500 MG PO TABS: 500.0000 mg | ORAL_TABLET | Freq: Three times a day (TID) | ORAL | Status: DC | PRN

## 2023-05-17 MED ORDER — OXYCODONE HCL 5 MG PO TABS: 5.0000 mg | ORAL_TABLET | Freq: Four times a day (QID) | ORAL | Status: DC | PRN

## 2023-05-17 MED ORDER — QUETIAPINE FUMARATE 200 MG PO TABS: 200.0000 mg | ORAL_TABLET | Freq: Every day | ORAL | Status: DC

## 2023-05-17 MED ORDER — LIDOCAINE 5 % EX PTCH: 1.0000 | MEDICATED_PATCH | CUTANEOUS | Status: DC

## 2023-05-17 MED ORDER — OXYCODONE HCL 5 MG PO TABS
5.0000 mg | ORAL_TABLET | Freq: Four times a day (QID) | ORAL | 0 refills | Status: AC | PRN
Start: 2023-05-17 — End: 2023-05-25

## 2023-05-17 MED ORDER — ONDANSETRON HCL 4 MG PO TABS: 4.0000 mg | ORAL_TABLET | Freq: Four times a day (QID) | ORAL | Status: DC | PRN

## 2023-05-17 MED ORDER — METHOCARBAMOL 500 MG PO TABS: 500.0000 mg | ORAL_TABLET | Freq: Three times a day (TID) | ORAL | 0 refills | Status: AC | PRN

## 2023-05-17 MED ORDER — LORAZEPAM 1 MG PO TABS
1.0000 mg | ORAL_TABLET | ORAL | Status: AC | PRN
  Administered 2023-05-17: 1 mg via ORAL
  Filled 2023-05-17: qty 1

## 2023-05-17 MED ORDER — CEPHALEXIN 500 MG PO CAPS: 500.0000 mg | ORAL_CAPSULE | Freq: Two times a day (BID) | ORAL | Status: AC

## 2023-05-17 MED ORDER — SENNA 8.6 MG PO TABS: 1.0000 | ORAL_TABLET | Freq: Every day | ORAL | Status: DC | PRN

## 2023-05-17 NOTE — Plan of Care (Signed)

## 2023-05-17 NOTE — Plan of Care (Signed)

## 2023-05-17 NOTE — TOC Transition Note (Signed)
Transition of Care [facility]) - CM/SW Discharge Note   Patient Details  Name: Tyler Horn MRN: [id number] Date of Birth: 1961-05-21  Transition of Care [facility]) CM/SW Contact:  Amada Jupiter, LCSW Phone Number: 05/17/2023, 2:48 PM   Clinical Narrative:     Have received insurance authorization and bed ready at [facility].  Medically cleared.  PT aware and agreeable.  PTAR called at 2:50pm.  RN to call report to [phone].  No further TOC needs.  Final next level of care: Skilled Nursing Facility Barriers to Discharge: Barriers Resolved   Patient Goals and CMS Choice CMS Medicare.gov Compare Post Acute Care list provided to:: Patient Choice offered to / list presented to : Patient  Discharge Placement PASRR number recieved: 05/16/23 PASRR number recieved: 05/16/23            Patient chooses bed at: [facility] Patient to be transferred to facility by: PTAR Name of family member notified: mother Patient and family notified of of transfer: 05/17/23  Discharge Plan and Services Additional resources added to the After Visit Summary for   In-house Referral: Clinical Social Work              DME Arranged: N/A DME Agency: NA                  Social Determinants of Health (SDOH) Interventions SDOH Screenings   Food Insecurity: No Food Insecurity (05/13/2023)  Housing: Low Risk  (05/13/2023)  Transportation Needs: No Transportation Needs (05/13/2023)  Utilities: Not At Risk (05/13/2023)  Alcohol Screen: Low Risk  (07/06/2021)  Depression (PHQ2-9): Low Risk  (04/11/2023)  Tobacco Use: Low Risk  (05/11/2023)     Readmission Risk Interventions    05/17/2023    2:47 PM  Readmission Risk Prevention Plan  Post Dischage Appt Complete  Medication Screening Complete  Transportation Screening Complete
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
## 2023-05-18 DIAGNOSIS — S32040A Wedge compression fracture of fourth lumbar vertebra, initial encounter for closed fracture: Secondary | ICD-10-CM | POA: Diagnosis not present

## 2023-05-18 DIAGNOSIS — N1831 Chronic kidney disease, stage 3a: Secondary | ICD-10-CM | POA: Diagnosis not present

## 2023-05-18 DIAGNOSIS — H9190 Unspecified hearing loss, unspecified ear: Secondary | ICD-10-CM | POA: Diagnosis not present

## 2023-05-19 DIAGNOSIS — R531 Weakness: Secondary | ICD-10-CM | POA: Diagnosis not present

## 2023-05-19 DIAGNOSIS — K5900 Constipation, unspecified: Secondary | ICD-10-CM | POA: Diagnosis not present

## 2023-05-19 DIAGNOSIS — N390 Urinary tract infection, site not specified: Secondary | ICD-10-CM | POA: Diagnosis not present

## 2023-05-19 DIAGNOSIS — S32049D Unspecified fracture of fourth lumbar vertebra, subsequent encounter for fracture with routine healing: Secondary | ICD-10-CM | POA: Diagnosis not present

## 2023-05-19 DIAGNOSIS — R52 Pain, unspecified: Secondary | ICD-10-CM | POA: Diagnosis not present

## 2023-05-19 LAB — URINE CULTURE: Culture: >=100000 — AB

## 2023-05-20 DIAGNOSIS — R42 Dizziness and giddiness: Secondary | ICD-10-CM | POA: Diagnosis not present

## 2023-05-20 DIAGNOSIS — R531 Weakness: Secondary | ICD-10-CM | POA: Diagnosis not present

## 2023-05-20 DIAGNOSIS — E860 Dehydration: Secondary | ICD-10-CM | POA: Diagnosis not present

## 2023-05-21 DIAGNOSIS — R531 Weakness: Secondary | ICD-10-CM | POA: Diagnosis not present

## 2023-05-22 DIAGNOSIS — N182 Chronic kidney disease, stage 2 (mild): Secondary | ICD-10-CM | POA: Diagnosis not present

## 2023-05-22 DIAGNOSIS — R531 Weakness: Secondary | ICD-10-CM | POA: Diagnosis not present

## 2023-05-24 DIAGNOSIS — E860 Dehydration: Secondary | ICD-10-CM | POA: Diagnosis not present

## 2023-05-24 DIAGNOSIS — S32049D Unspecified fracture of fourth lumbar vertebra, subsequent encounter for fracture with routine healing: Secondary | ICD-10-CM | POA: Diagnosis not present

## 2023-05-24 DIAGNOSIS — R197 Diarrhea, unspecified: Secondary | ICD-10-CM | POA: Diagnosis not present

## 2023-05-24 DIAGNOSIS — E46 Unspecified protein-calorie malnutrition: Secondary | ICD-10-CM | POA: Diagnosis not present

## 2023-05-24 DIAGNOSIS — H913 Deaf nonspeaking, not elsewhere classified: Secondary | ICD-10-CM | POA: Diagnosis not present

## 2023-05-26 ENCOUNTER — Other Ambulatory Visit: Payer: Self-pay | Admitting: *Deleted

## 2023-05-26 NOTE — Patient Outreach (Signed)
Late entry for 05/25/23. Tyler Horn resides in [facility] skilled nursing facility. Screening for potential care coordination/ chronic care management services as a benefit of health plan and primary care provider.  Collaboration with [facility] therapy manager and Child psychotherapist. Tyler Horn is from home alone. He is hearing impaired. Discussed PCP is not listed. Social worker reports mother Tyler Horn is primary contact.   Will plan outreach to mother.  Raiford Noble, MSN, RN, BSN [place]  [facility], [facility] Commun[facility] RN Post- Acute Care Coordinator Direct Dial: [phone]

## 2023-05-27 ENCOUNTER — Other Ambulatory Visit: Payer: Self-pay | Admitting: *Deleted

## 2023-05-27 DIAGNOSIS — H913 Deaf nonspeaking, not elsewhere classified: Secondary | ICD-10-CM | POA: Diagnosis not present

## 2023-05-27 DIAGNOSIS — N182 Chronic kidney disease, stage 2 (mild): Secondary | ICD-10-CM | POA: Diagnosis not present

## 2023-05-27 DIAGNOSIS — S32049D Unspecified fracture of fourth lumbar vertebra, subsequent encounter for fracture with routine healing: Secondary | ICD-10-CM | POA: Diagnosis not present

## 2023-05-27 NOTE — Patient Outreach (Signed)
Telephone call received from Director of [facility] case management, Sheletha.  Tyler Horn is active with their program. His new PCP is Dr. Laural Benes with [facility].   Sheletha reports their program performs home visits. Discussed writer will not attempt to refer to care management team since [facility] is already active.   Tyler Horn likely to dc from SNF next week.  No identifiable care coordination needs.   Raiford Noble, MSN, RN, BSN [place]  [facility], Healthy Communities RN Post- Acute Care Coordinator Direct Dial: [phone]

## 2023-05-27 NOTE — Patient Outreach (Signed)
Tyler Horn resides in [facility] SNF. Screening for potential care coordination/ chronic care management services as a benefit of health plan and primary care provider.  Noted Tyler Horn has been active with [facility], per chart records, in the past. Writer sent secure communication for collaboration to verify whether he is still active. Also noted upcoming PCP appointment with [facility] on 06/10/23.   [name] with an external care management program, there will be no identifiable care coordination needs from writer's perspective.   Will await response.   Raiford Noble, MSN, RN, BSN [place]  [facility], Healthy Communities RN Post- Acute Care Coordinator Direct Dial: [phone]

## 2023-05-31 ENCOUNTER — Other Ambulatory Visit: Payer: Self-pay | Admitting: *Deleted

## 2023-05-31 NOTE — Patient Outreach (Signed)
Post-Acute Care Coordinator follow up. Per [facility] Mr. Tyler Horn discharged from [facility] skilled nursing facility on 05/30/23.  Verified with Tyler Horn [facility] social worker, [facility] home health was arranged.   [name] is followed by [facility] case management program.   Tyler Noble, MSN, RN, BSN [place]  [facility], Healthy Communities RN Post- Acute Care Coordinator Direct Dial: [phone]

## 2023-06-02 DIAGNOSIS — M48061 Spinal stenosis, lumbar region without neurogenic claudication: Secondary | ICD-10-CM | POA: Diagnosis not present

## 2023-06-02 DIAGNOSIS — S32048D Other fracture of fourth lumbar vertebra, subsequent encounter for fracture with routine healing: Secondary | ICD-10-CM | POA: Diagnosis not present

## 2023-06-02 DIAGNOSIS — G8929 Other chronic pain: Secondary | ICD-10-CM | POA: Diagnosis not present

## 2023-06-02 DIAGNOSIS — N182 Chronic kidney disease, stage 2 (mild): Secondary | ICD-10-CM | POA: Diagnosis not present

## 2023-06-03 DIAGNOSIS — M48061 Spinal stenosis, lumbar region without neurogenic claudication: Secondary | ICD-10-CM | POA: Diagnosis not present

## 2023-06-03 DIAGNOSIS — S32048D Other fracture of fourth lumbar vertebra, subsequent encounter for fracture with routine healing: Secondary | ICD-10-CM | POA: Diagnosis not present

## 2023-06-03 DIAGNOSIS — G8929 Other chronic pain: Secondary | ICD-10-CM | POA: Diagnosis not present

## 2023-06-03 DIAGNOSIS — N182 Chronic kidney disease, stage 2 (mild): Secondary | ICD-10-CM | POA: Diagnosis not present

## 2023-06-07 DIAGNOSIS — S32048D Other fracture of fourth lumbar vertebra, subsequent encounter for fracture with routine healing: Secondary | ICD-10-CM | POA: Diagnosis not present

## 2023-06-07 DIAGNOSIS — G8929 Other chronic pain: Secondary | ICD-10-CM | POA: Diagnosis not present

## 2023-06-07 DIAGNOSIS — N182 Chronic kidney disease, stage 2 (mild): Secondary | ICD-10-CM | POA: Diagnosis not present

## 2023-06-07 DIAGNOSIS — M48061 Spinal stenosis, lumbar region without neurogenic claudication: Secondary | ICD-10-CM | POA: Diagnosis not present

## 2023-06-09 DIAGNOSIS — G8929 Other chronic pain: Secondary | ICD-10-CM | POA: Diagnosis not present

## 2023-06-09 DIAGNOSIS — M48061 Spinal stenosis, lumbar region without neurogenic claudication: Secondary | ICD-10-CM | POA: Diagnosis not present

## 2023-06-09 DIAGNOSIS — N182 Chronic kidney disease, stage 2 (mild): Secondary | ICD-10-CM | POA: Diagnosis not present

## 2023-06-09 DIAGNOSIS — S32048D Other fracture of fourth lumbar vertebra, subsequent encounter for fracture with routine healing: Secondary | ICD-10-CM | POA: Diagnosis not present

## 2023-06-10 ENCOUNTER — Ambulatory Visit: Payer: Medicare Other | Attending: Internal Medicine | Admitting: Internal Medicine

## 2023-06-10 ENCOUNTER — Encounter: Payer: Self-pay | Admitting: Internal Medicine

## 2023-06-10 VITALS — BP 103/71 | HR 90 | Temp 97.8°F | Ht 76.0 in | Wt 188.0 lb

## 2023-06-10 DIAGNOSIS — N182 Chronic kidney disease, stage 2 (mild): Secondary | ICD-10-CM | POA: Diagnosis not present

## 2023-06-10 DIAGNOSIS — F320 Major depressive disorder, single episode, mild: Secondary | ICD-10-CM

## 2023-06-10 DIAGNOSIS — Z8619 Personal history of other infectious and parasitic diseases: Secondary | ICD-10-CM | POA: Diagnosis not present

## 2023-06-10 DIAGNOSIS — S32001A Stable burst fracture of unspecified lumbar vertebra, initial encounter for closed fracture: Secondary | ICD-10-CM

## 2023-06-10 DIAGNOSIS — Z7689 Persons encountering health services in other specified circumstances: Secondary | ICD-10-CM | POA: Diagnosis not present

## 2023-06-10 DIAGNOSIS — Z21 Asymptomatic human immunodeficiency virus [HIV] infection status: Secondary | ICD-10-CM

## 2023-06-10 DIAGNOSIS — Z1211 Encounter for screening for malignant neoplasm of colon: Secondary | ICD-10-CM

## 2023-06-10 DIAGNOSIS — G8929 Other chronic pain: Secondary | ICD-10-CM | POA: Diagnosis not present

## 2023-06-10 DIAGNOSIS — M48061 Spinal stenosis, lumbar region without neurogenic claudication: Secondary | ICD-10-CM | POA: Diagnosis not present

## 2023-06-10 DIAGNOSIS — S32048D Other fracture of fourth lumbar vertebra, subsequent encounter for fracture with routine healing: Secondary | ICD-10-CM | POA: Diagnosis not present

## 2023-06-10 DIAGNOSIS — N289 Disorder of kidney and ureter, unspecified: Secondary | ICD-10-CM | POA: Diagnosis not present

## 2023-06-10 MED ORDER — LIDOCAINE 5 % EX PTCH
1.0000 | MEDICATED_PATCH | CUTANEOUS | 1 refills | Status: DC
Start: 2023-06-10 — End: 2024-01-27
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
## 2023-06-11 LAB — BASIC METABOLIC PANEL
BUN/Creatinine Ratio: 15 (ref 10–24)
BUN: 22 mg/dL (ref 8–27)
CO2: 19 mmol/L — ABNORMAL LOW (ref 20–29)
Calcium: 9.7 mg/dL (ref 8.6–10.2)
Chloride: 103 mmol/L (ref 96–106)
Creatinine, Ser: 1.43 mg/dL — ABNORMAL HIGH (ref 0.76–1.27)
Glucose: 87 mg/dL (ref 70–99)
Potassium: 4.4 mmol/L (ref 3.5–5.2)
Sodium: 138 mmol/L (ref 134–144)
eGFR: 55 mL/min/{1.73_m2} — ABNORMAL LOW (ref >59)

## 2023-06-13 ENCOUNTER — Encounter: Payer: Self-pay | Admitting: Internal Medicine

## 2023-06-13 DIAGNOSIS — S32048D Other fracture of fourth lumbar vertebra, subsequent encounter for fracture with routine healing: Secondary | ICD-10-CM | POA: Diagnosis not present

## 2023-06-13 DIAGNOSIS — M48061 Spinal stenosis, lumbar region without neurogenic claudication: Secondary | ICD-10-CM | POA: Diagnosis not present

## 2023-06-13 DIAGNOSIS — G8929 Other chronic pain: Secondary | ICD-10-CM | POA: Diagnosis not present

## 2023-06-13 DIAGNOSIS — N182 Chronic kidney disease, stage 2 (mild): Secondary | ICD-10-CM | POA: Diagnosis not present

## 2023-06-15 DIAGNOSIS — M48061 Spinal stenosis, lumbar region without neurogenic claudication: Secondary | ICD-10-CM | POA: Diagnosis not present

## 2023-06-15 DIAGNOSIS — G8929 Other chronic pain: Secondary | ICD-10-CM | POA: Diagnosis not present

## 2023-06-15 DIAGNOSIS — N182 Chronic kidney disease, stage 2 (mild): Secondary | ICD-10-CM | POA: Diagnosis not present

## 2023-06-15 DIAGNOSIS — S32048D Other fracture of fourth lumbar vertebra, subsequent encounter for fracture with routine healing: Secondary | ICD-10-CM | POA: Diagnosis not present

## 2023-06-17 ENCOUNTER — Ambulatory Visit (INDEPENDENT_AMBULATORY_CARE_PROVIDER_SITE_OTHER): Payer: Medicare Other | Admitting: Student in an Organized Health Care Education/Training Program

## 2023-06-17 VITALS — BP 113/79 | HR 81 | Resp 12 | Wt 187.0 lb

## 2023-06-17 DIAGNOSIS — F4311 Post-traumatic stress disorder, acute: Secondary | ICD-10-CM | POA: Diagnosis not present

## 2023-06-17 DIAGNOSIS — F411 Generalized anxiety disorder: Secondary | ICD-10-CM

## 2023-06-17 DIAGNOSIS — F5105 Insomnia due to other mental disorder: Secondary | ICD-10-CM

## 2023-06-17 DIAGNOSIS — F3340 Major depressive disorder, recurrent, in remission, unspecified: Secondary | ICD-10-CM | POA: Diagnosis not present

## 2023-06-17 DIAGNOSIS — F410 Panic disorder [episodic paroxysmal anxiety] without agoraphobia: Secondary | ICD-10-CM

## 2023-06-17 DIAGNOSIS — F99 Mental disorder, not otherwise specified: Secondary | ICD-10-CM

## 2023-06-17 MED ORDER — QUETIAPINE FUMARATE 150 MG PO TABS: 150.0000 mg | ORAL_TABLET | Freq: Every day | ORAL | 3 refills | Status: DC

## 2023-06-17 MED ORDER — ESCITALOPRAM OXALATE 20 MG PO TABS: 20.0000 mg | ORAL_TABLET | Freq: Every day | ORAL | 3 refills | Status: DC
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
## 2023-06-22 DIAGNOSIS — M48061 Spinal stenosis, lumbar region without neurogenic claudication: Secondary | ICD-10-CM | POA: Diagnosis not present

## 2023-06-22 DIAGNOSIS — N182 Chronic kidney disease, stage 2 (mild): Secondary | ICD-10-CM | POA: Diagnosis not present

## 2023-06-22 DIAGNOSIS — G8929 Other chronic pain: Secondary | ICD-10-CM | POA: Diagnosis not present

## 2023-06-22 DIAGNOSIS — S32048D Other fracture of fourth lumbar vertebra, subsequent encounter for fracture with routine healing: Secondary | ICD-10-CM | POA: Diagnosis not present

## 2023-07-13 IMAGING — DX DG CHEST 1V PORT
1 series · 1 of 1 positions shown · non-contrast
Comparison: Chest x-ray dated October 26, 2019.

CLINICAL DATA: Lightheadedness.

EXAM:
PORTABLE CHEST 1 VIEW

[chest ap]
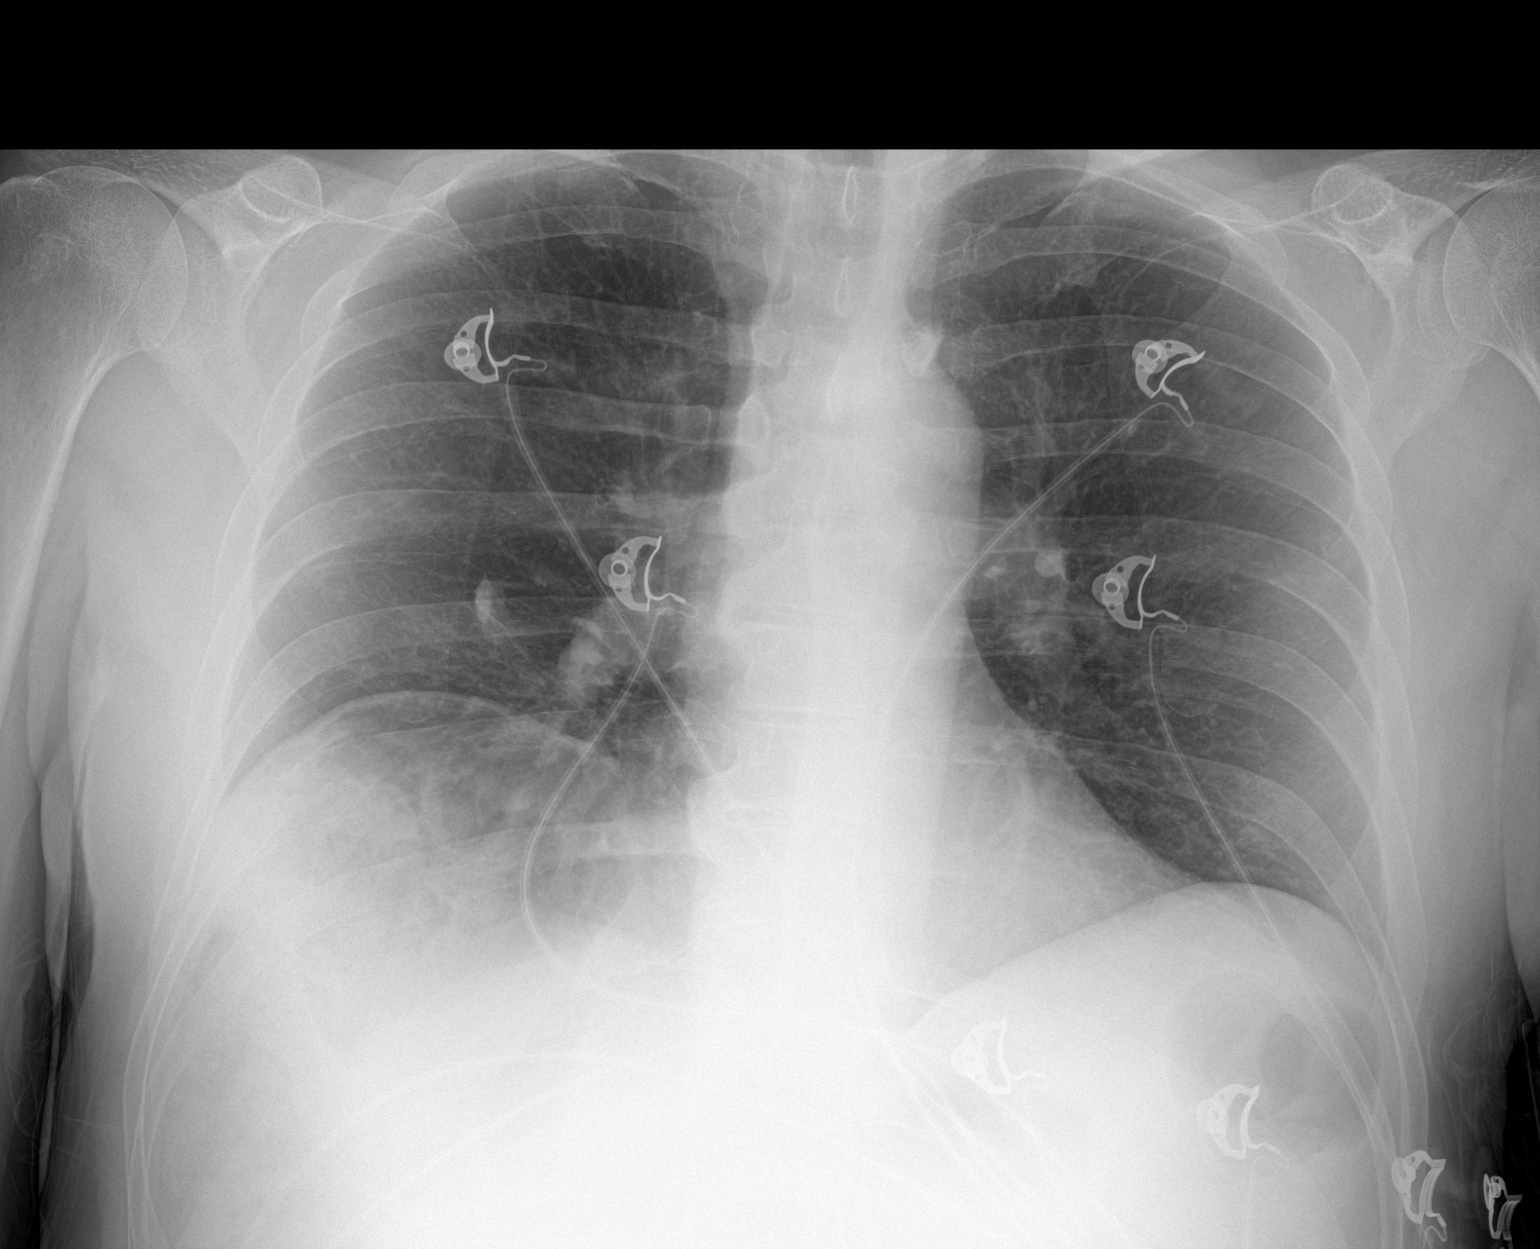

[1 of 1 positions shown; findings below may reference images not displayed]

FINDINGS: The heart size and mediastinal contours are within normal limits.
Normal pulmonary vascularity. Unchanged mild scarring in the right
middle lobe. No focal consolidation, pleural effusion, or
pneumothorax. Mild elevation of the right hemidiaphragm. No acute
osseous abnormality.
IMPRESSION: 1. No active disease.

## 2023-08-12 ENCOUNTER — Ambulatory Visit (INDEPENDENT_AMBULATORY_CARE_PROVIDER_SITE_OTHER): Payer: Medicare Other | Admitting: Student in an Organized Health Care Education/Training Program

## 2023-08-12 VITALS — BP 112/77 | HR 79 | Wt 178.0 lb

## 2023-08-12 DIAGNOSIS — F4310 Post-traumatic stress disorder, unspecified: Secondary | ICD-10-CM

## 2023-08-12 DIAGNOSIS — F410 Panic disorder [episodic paroxysmal anxiety] without agoraphobia: Secondary | ICD-10-CM

## 2023-08-12 DIAGNOSIS — F411 Generalized anxiety disorder: Secondary | ICD-10-CM

## 2023-08-12 DIAGNOSIS — F3340 Major depressive disorder, recurrent, in remission, unspecified: Secondary | ICD-10-CM | POA: Diagnosis not present

## 2023-08-12 DIAGNOSIS — F5105 Insomnia due to other mental disorder: Secondary | ICD-10-CM | POA: Diagnosis not present

## 2023-08-12 DIAGNOSIS — F99 Mental disorder, not otherwise specified: Secondary | ICD-10-CM

## 2023-08-12 MED ORDER — QUETIAPINE FUMARATE 25 MG PO TABS
25.0000 mg | ORAL_TABLET | Freq: Every day | ORAL | 2 refills | Status: DC
Start: 2023-08-12 — End: 2024-01-27

## 2023-08-12 MED ORDER — ESCITALOPRAM OXALATE 20 MG PO TABS: 20.0000 mg | ORAL_TABLET | Freq: Every day | ORAL | 2 refills | Status: DC

## 2023-08-12 MED ORDER — QUETIAPINE FUMARATE 150 MG PO TABS
150.0000 mg | ORAL_TABLET | Freq: Every day | ORAL | 2 refills | Status: DC
Start: 2023-08-12 — End: 2024-01-27
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
## 2023-10-13 ENCOUNTER — Telehealth (HOSPITAL_COMMUNITY): Payer: Self-pay

## 2023-10-13 NOTE — Telephone Encounter (Signed)
Tyler Horn with interpreter services called today at 235 stating we needed to call the PT to tell them we have no one to interpret for them. I asked Shanda Bumps how I was supposed to do that considering the PT uses sign language, Shanda Bumps stated that I should call the number on file - and tell PT that the

## 2023-10-14 ENCOUNTER — Encounter (HOSPITAL_COMMUNITY): Payer: Self-pay

## 2023-10-14 ENCOUNTER — Ambulatory Visit: Payer: Medicare Other | Admitting: Internal Medicine

## 2023-10-14 ENCOUNTER — Encounter (HOSPITAL_COMMUNITY): Payer: Medicare Other | Admitting: Student in an Organized Health Care Education/Training Program

## 2023-10-17 ENCOUNTER — Ambulatory Visit: Payer: Medicare Other | Admitting: Family

## 2023-11-10 ENCOUNTER — Ambulatory Visit: Payer: Medicare Other | Attending: Physician Assistant | Admitting: Physician Assistant

## 2023-11-10 VITALS — BP 118/74 | HR 51 | Temp 97.9°F | Ht 76.0 in | Wt 182.2 lb

## 2023-11-10 DIAGNOSIS — N1831 Chronic kidney disease, stage 3a: Secondary | ICD-10-CM

## 2023-11-10 DIAGNOSIS — Z13220 Encounter for screening for lipoid disorders: Secondary | ICD-10-CM

## 2023-11-10 DIAGNOSIS — Z21 Asymptomatic human immunodeficiency virus [HIV] infection status: Secondary | ICD-10-CM

## 2023-11-10 NOTE — Progress Notes (Signed)
 Patient ID: Tyler Horn, male   DOB: 01/18/1961, 63 y.o.   MRN: [id number]   Layth Cerezo, is a 63 y.o. male  [id number]  [id number]  DOB - 1961-01-31  Chief Complaint  Patient presents with   Follow-up       Subjective:   Tyler Horn is a 63 y.o. male here today for recheck kidney function and check in.  He is managed by [facility] for HIV.  He is fasting this morning and has not had cholesterol checked in 2 years.  He has been taking cranberry/cherry tablets and drinking a lot of water to try and improve kidney health.  He denies any concerns or new issues today.  Mental health is good and followed by Psych and counseling at [facility] in [address]  Denies SI/HI  In person ASL interpreter with him today  No problems updated.  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 Authorizing Provider  bictegravir-emtricitabine-tenofovir AF (BIKTARVY) 50-200-25 MG TABS tablet Take 1 tablet by mouth daily. 04/11/23   Veryl Speak, FNP  escitalopram (LEXAPRO) 20 MG tablet Take 1 tablet (20 mg total) by mouth daily. 08/12/23 08/11/24  Bobbye Morton, MD  lidocaine (LIDODERM) 5 % Place 1 patch onto the skin daily. Remove & Discard patch within 12 hours or as directed by MD 06/10/23   Marcine Matar, MD  ondansetron (ZOFRAN) 4 MG tablet Take 1 tablet (4 mg total) by mouth every 6 (six) hours as needed for nausea. 05/17/23   Carollee Herter, DO  Probiotic Product (PROBIOTIC PO) Take 1 tablet by mouth daily as needed (stomach).    [provider]  QUEtiapine (SEROQUEL) 25 MG tablet Take 1 tablet (25 mg total) by  mouth at bedtime. 08/12/23   Bobbye Morton, MD  QUEtiapine Fumarate 150 MG TABS Take 150 mg by mouth at bedtime. 08/12/23   Bobbye Morton, MD  senna (SENOKOT) 8.6 MG TABS tablet Take 1 tablet (8.6 mg total) by mouth daily as needed for mild constipation. 05/17/23   Carollee Herter, DO    ROS: Neg HEENT Neg resp Neg cardiac Neg GI Neg GU Neg MS Neg psych Neg neuro  Objective:   Vitals:   11/10/23 0937  BP: 118/74  Pulse: (!) 51  Temp: 97.9 F (36.6 C)  TempSrc: Oral  Weight: 182 lb 3.2 oz (82.6 kg)  Height: 6\' 4"  (1.93 m)   Exam General appearance : Awake, alert, not in any distress. Speech Clear. Not toxic looking HEENT: Atraumatic and Normocephalic Neck: Supple, no JVD. No cervical lymphadenopathy.  Chest: Good air entry bilaterally, CTAB.  No rales/rhonchi/wheezing CVS: S1 S2 regular, no murmurs.  Extremities: B/L Lower Ext shows no edema, both legs are warm to touch Neurology: Awake alert, and oriented X 3, CN II-XII intact, Non focal Skin: No Rash  Data Review Lab Results  Component Value Date   HGBA1C 5.2 07/07/2021   HGBA1C 5.0 05/06/2021   HGBA1C 5.1 10/18/2020    Assessment & Plan   1. CKD stage 3a, GFR 45-59 ml/min (HCC) - baseline SCr 1.3-1.4 Continue drinking adequate water for kidney health -  Comprehensive metabolic panel - Lipid Panel  2. HIV infection, asymptomatic (HCC) (Primary) On bitkarvy and followed by ID - Comprehensive metabolic panel  3. Screening cholesterol level He is fasting this morning and this has not been checked  - Comprehensive metabolic panel - Lipid Panel    Return in about 6 months (around 05/12/2024) for PCP for chronic conditions.  The patient was given clear instructions to go to ER or return to medical center if symptoms don't improve, worsen or new problems develop. The patient verbalized understanding. The patient was told to call to get lab results if they haven't heard anything in the next week.      Georgian Co, PA-C [facility] [place], [place] [phone]   11/10/2023, 10:25 AM

## 2023-11-11 LAB — LIPID PANEL
Chol/HDL Ratio: 4.2 ratio (ref 0.0–5.0)
Cholesterol, Total: 194 mg/dL (ref 100–199)
HDL: 46 mg/dL (ref >39)
LDL Chol Calc (NIH): 133 mg/dL — ABNORMAL HIGH (ref 0–99)
Triglycerides: 83 mg/dL (ref 0–149)
VLDL Cholesterol Cal: 15 mg/dL (ref 5–40)

## 2023-11-11 LAB — COMPREHENSIVE METABOLIC PANEL
ALT: 9 IU/L (ref 0–44)
AST: 13 IU/L (ref 0–40)
Albumin: 4.4 g/dL (ref 3.9–4.9)
Alkaline Phosphatase: 130 IU/L — ABNORMAL HIGH (ref 44–121)
BUN/Creatinine Ratio: 15 (ref 10–24)
BUN: 25 mg/dL (ref 8–27)
Bilirubin Total: 0.5 mg/dL (ref 0.0–1.2)
CO2: 21 mmol/L (ref 20–29)
Calcium: 10 mg/dL (ref 8.6–10.2)
Chloride: 102 mmol/L (ref 96–106)
Creatinine, Ser: 1.69 mg/dL — ABNORMAL HIGH (ref 0.76–1.27)
Globulin, Total: 3.8 g/dL (ref 1.5–4.5)
Glucose: 87 mg/dL (ref 70–99)
Potassium: 4.9 mmol/L (ref 3.5–5.2)
Sodium: 137 mmol/L (ref 134–144)
Total Protein: 8.2 g/dL (ref 6.0–8.5)
eGFR: 45 mL/min/{1.73_m2} — ABNORMAL LOW (ref >59)

## 2023-11-16 ENCOUNTER — Ambulatory Visit: Payer: Medicare Other | Admitting: Family

## 2023-11-18 ENCOUNTER — Encounter (HOSPITAL_COMMUNITY): Payer: Medicare Other | Admitting: Student in an Organized Health Care Education/Training Program

## 2023-12-02 ENCOUNTER — Encounter (HOSPITAL_COMMUNITY): Admitting: Student in an Organized Health Care Education/Training Program

## 2024-01-05 DIAGNOSIS — R58 Hemorrhage, not elsewhere classified: Secondary | ICD-10-CM | POA: Diagnosis not present

## 2024-01-05 DIAGNOSIS — R404 Transient alteration of awareness: Secondary | ICD-10-CM | POA: Diagnosis not present

## 2024-01-25 ENCOUNTER — Telehealth (HOSPITAL_COMMUNITY): Payer: Self-pay | Admitting: Student in an Organized Health Care Education/Training Program

## 2024-01-26 ENCOUNTER — Telehealth: Payer: Self-pay

## 2024-01-26 NOTE — Telephone Encounter (Signed)
 Margaret with [facility] updated staff that pt passed away. Will update provider. Julien Odor, RMA

## 2024-01-29 DIAGNOSIS — 419620001 Death: Secondary | SNOMED CT | POA: Diagnosis not present

## 2024-01-29 DEATH — deceased

## 2024-05-14 ENCOUNTER — Ambulatory Visit: Admitting: Internal Medicine
